# Patient Record
Sex: Female | Born: 1950
Health system: Southern US, Community
[De-identification: ages and names within clinical notes are randomized; demographics above are authoritative.]

## PROBLEM LIST (undated history)

## (undated) ENCOUNTER — Emergency Department (HOSPITAL_BASED_OUTPATIENT_CLINIC_OR_DEPARTMENT_OTHER): Admission: EM | Source: Home / Self Care

## (undated) DIAGNOSIS — G8929 Other chronic pain: Secondary | ICD-10-CM

## (undated) DIAGNOSIS — F32A Depression, unspecified: Secondary | ICD-10-CM

## (undated) DIAGNOSIS — Z8669 Personal history of other diseases of the nervous system and sense organs: Secondary | ICD-10-CM

## (undated) DIAGNOSIS — K08109 Complete loss of teeth, unspecified cause, unspecified class: Secondary | ICD-10-CM

## (undated) DIAGNOSIS — M549 Dorsalgia, unspecified: Secondary | ICD-10-CM

## (undated) DIAGNOSIS — Z87440 Personal history of urinary (tract) infections: Secondary | ICD-10-CM

## (undated) DIAGNOSIS — N1330 Unspecified hydronephrosis: Secondary | ICD-10-CM

## (undated) DIAGNOSIS — Z860100 Personal history of colon polyps, unspecified: Secondary | ICD-10-CM

## (undated) DIAGNOSIS — M51369 Other intervertebral disc degeneration, lumbar region without mention of lumbar back pain or lower extremity pain: Secondary | ICD-10-CM

## (undated) DIAGNOSIS — G2581 Restless legs syndrome: Secondary | ICD-10-CM

## (undated) DIAGNOSIS — H269 Unspecified cataract: Secondary | ICD-10-CM

## (undated) DIAGNOSIS — N189 Chronic kidney disease, unspecified: Secondary | ICD-10-CM

## (undated) DIAGNOSIS — N210 Calculus in bladder: Secondary | ICD-10-CM

## (undated) DIAGNOSIS — E039 Hypothyroidism, unspecified: Secondary | ICD-10-CM

## (undated) DIAGNOSIS — Z8619 Personal history of other infectious and parasitic diseases: Secondary | ICD-10-CM

## (undated) DIAGNOSIS — K589 Irritable bowel syndrome without diarrhea: Secondary | ICD-10-CM

## (undated) DIAGNOSIS — K2270 Barrett's esophagus without dysplasia: Secondary | ICD-10-CM

## (undated) DIAGNOSIS — J309 Allergic rhinitis, unspecified: Secondary | ICD-10-CM

## (undated) DIAGNOSIS — Z87448 Personal history of other diseases of urinary system: Secondary | ICD-10-CM

## (undated) DIAGNOSIS — Z973 Presence of spectacles and contact lenses: Secondary | ICD-10-CM

## (undated) DIAGNOSIS — M4150 Other secondary scoliosis, site unspecified: Secondary | ICD-10-CM

## (undated) DIAGNOSIS — Z87442 Personal history of urinary calculi: Secondary | ICD-10-CM

## (undated) DIAGNOSIS — M4306 Spondylolysis, lumbar region: Secondary | ICD-10-CM

## (undated) DIAGNOSIS — B019 Varicella without complication: Secondary | ICD-10-CM

## (undated) DIAGNOSIS — M5136 Other intervertebral disc degeneration, lumbar region: Secondary | ICD-10-CM

## (undated) DIAGNOSIS — L409 Psoriasis, unspecified: Secondary | ICD-10-CM

## (undated) DIAGNOSIS — M8580 Other specified disorders of bone density and structure, unspecified site: Secondary | ICD-10-CM

## (undated) DIAGNOSIS — N135 Crossing vessel and stricture of ureter without hydronephrosis: Secondary | ICD-10-CM

## (undated) DIAGNOSIS — N3289 Other specified disorders of bladder: Secondary | ICD-10-CM

## (undated) DIAGNOSIS — I10 Essential (primary) hypertension: Secondary | ICD-10-CM

## (undated) DIAGNOSIS — R51 Headache: Secondary | ICD-10-CM

## (undated) DIAGNOSIS — T7840XA Allergy, unspecified, initial encounter: Secondary | ICD-10-CM

## (undated) DIAGNOSIS — K219 Gastro-esophageal reflux disease without esophagitis: Secondary | ICD-10-CM

## (undated) DIAGNOSIS — E1142 Type 2 diabetes mellitus with diabetic polyneuropathy: Secondary | ICD-10-CM

## (undated) DIAGNOSIS — M4180 Other forms of scoliosis, site unspecified: Secondary | ICD-10-CM

## (undated) DIAGNOSIS — L219 Seborrheic dermatitis, unspecified: Secondary | ICD-10-CM

## (undated) DIAGNOSIS — G4700 Insomnia, unspecified: Secondary | ICD-10-CM

## (undated) DIAGNOSIS — M797 Fibromyalgia: Secondary | ICD-10-CM

## (undated) DIAGNOSIS — F411 Generalized anxiety disorder: Secondary | ICD-10-CM

## (undated) DIAGNOSIS — K635 Polyp of colon: Secondary | ICD-10-CM

## (undated) DIAGNOSIS — F329 Major depressive disorder, single episode, unspecified: Secondary | ICD-10-CM

## (undated) DIAGNOSIS — E119 Type 2 diabetes mellitus without complications: Secondary | ICD-10-CM

## (undated) DIAGNOSIS — E785 Hyperlipidemia, unspecified: Secondary | ICD-10-CM

## (undated) DIAGNOSIS — G629 Polyneuropathy, unspecified: Secondary | ICD-10-CM

## (undated) HISTORY — DX: Hyperlipidemia, unspecified: E78.5

## (undated) HISTORY — DX: Depression, unspecified: F32.A

## (undated) HISTORY — DX: Headache: R51

## (undated) HISTORY — DX: Type 2 diabetes mellitus with diabetic polyneuropathy: E11.42

## (undated) HISTORY — PX: UPPER GI ENDOSCOPY: SHX6162

## (undated) HISTORY — DX: Polyp of colon: K63.5

## (undated) HISTORY — DX: Varicella without complication: B01.9

## (undated) HISTORY — DX: Unspecified cataract: H26.9

## (undated) HISTORY — DX: Allergy, unspecified, initial encounter: T78.40XA

## (undated) HISTORY — DX: Allergic rhinitis, unspecified: J30.9

## (undated) HISTORY — DX: Major depressive disorder, single episode, unspecified: F32.9

## (undated) HISTORY — DX: Hypothyroidism, unspecified: E03.9

## (undated) HISTORY — DX: Chronic kidney disease, unspecified: N18.9

## (undated) HISTORY — PX: LUMBAR MICRODISCECTOMY: SHX99

## (undated) HISTORY — DX: Personal history of urinary (tract) infections: Z87.440

## (undated) HISTORY — PX: GALLBLADDER SURGERY: SHX652

## (undated) HISTORY — PX: CHOLECYSTECTOMY: SHX55

## (undated) HISTORY — DX: Unspecified hydronephrosis: N13.30

---

## 1985-06-22 HISTORY — PX: ABDOMINAL HYSTERECTOMY: SHX81

## 1998-01-29 ENCOUNTER — Ambulatory Visit (HOSPITAL_COMMUNITY): Admission: RE | Admit: 1998-01-29 | Discharge: 1998-01-29 | Payer: Self-pay | Admitting: Neurosurgery

## 2000-06-22 LAB — HM COLONOSCOPY

## 2000-10-30 ENCOUNTER — Ambulatory Visit (HOSPITAL_COMMUNITY): Admission: RE | Admit: 2000-10-30 | Discharge: 2000-10-30 | Payer: Self-pay | Admitting: Internal Medicine

## 2000-10-30 ENCOUNTER — Encounter: Payer: Self-pay | Admitting: Internal Medicine

## 2000-12-17 ENCOUNTER — Encounter: Payer: Self-pay | Admitting: Neurosurgery

## 2000-12-17 ENCOUNTER — Encounter: Admission: RE | Admit: 2000-12-17 | Discharge: 2000-12-17 | Payer: Self-pay | Admitting: Neurosurgery

## 2001-01-07 ENCOUNTER — Encounter: Payer: Self-pay | Admitting: Neurosurgery

## 2001-01-07 ENCOUNTER — Encounter: Admission: RE | Admit: 2001-01-07 | Discharge: 2001-01-07 | Payer: Self-pay | Admitting: Neurosurgery

## 2001-06-22 HISTORY — PX: OTHER SURGICAL HISTORY: SHX169

## 2001-06-24 ENCOUNTER — Encounter: Payer: Self-pay | Admitting: Neurosurgery

## 2001-06-24 ENCOUNTER — Ambulatory Visit (HOSPITAL_COMMUNITY): Admission: RE | Admit: 2001-06-24 | Discharge: 2001-06-24 | Payer: Self-pay | Admitting: Neurosurgery

## 2002-04-06 ENCOUNTER — Encounter: Admission: RE | Admit: 2002-04-06 | Discharge: 2002-07-05 | Payer: Self-pay | Admitting: Orthopedic Surgery

## 2002-05-14 ENCOUNTER — Ambulatory Visit (HOSPITAL_COMMUNITY): Admission: RE | Admit: 2002-05-14 | Discharge: 2002-05-14 | Payer: Self-pay | Admitting: Orthopedic Surgery

## 2002-05-14 ENCOUNTER — Encounter: Payer: Self-pay | Admitting: Orthopedic Surgery

## 2002-06-13 ENCOUNTER — Ambulatory Visit (HOSPITAL_BASED_OUTPATIENT_CLINIC_OR_DEPARTMENT_OTHER): Admission: RE | Admit: 2002-06-13 | Discharge: 2002-06-13 | Payer: Self-pay | Admitting: Orthopedic Surgery

## 2004-07-17 ENCOUNTER — Ambulatory Visit: Payer: Self-pay | Admitting: Internal Medicine

## 2004-07-21 ENCOUNTER — Ambulatory Visit (HOSPITAL_COMMUNITY): Admission: RE | Admit: 2004-07-21 | Discharge: 2004-07-21 | Payer: Self-pay | Admitting: Internal Medicine

## 2004-10-16 ENCOUNTER — Ambulatory Visit: Payer: Self-pay | Admitting: Internal Medicine

## 2004-12-04 ENCOUNTER — Ambulatory Visit: Payer: Self-pay | Admitting: Internal Medicine

## 2005-02-07 ENCOUNTER — Emergency Department (HOSPITAL_COMMUNITY): Admission: EM | Admit: 2005-02-07 | Discharge: 2005-02-07 | Payer: Self-pay | Admitting: Family Medicine

## 2006-02-25 ENCOUNTER — Ambulatory Visit: Payer: Self-pay | Admitting: Internal Medicine

## 2007-10-11 DIAGNOSIS — G8929 Other chronic pain: Secondary | ICD-10-CM | POA: Insufficient documentation

## 2007-10-11 DIAGNOSIS — I10 Essential (primary) hypertension: Secondary | ICD-10-CM | POA: Insufficient documentation

## 2007-10-11 DIAGNOSIS — M797 Fibromyalgia: Secondary | ICD-10-CM | POA: Insufficient documentation

## 2007-10-11 DIAGNOSIS — J309 Allergic rhinitis, unspecified: Secondary | ICD-10-CM | POA: Insufficient documentation

## 2008-03-17 ENCOUNTER — Emergency Department (HOSPITAL_COMMUNITY): Admission: EM | Admit: 2008-03-17 | Discharge: 2008-03-17 | Payer: Self-pay | Admitting: Emergency Medicine

## 2008-08-30 ENCOUNTER — Ambulatory Visit: Payer: Self-pay | Admitting: Internal Medicine

## 2008-08-30 LAB — CONVERTED CEMR LAB
ALT: 54 units/L — ABNORMAL HIGH (ref 0–35)
AST: 37 units/L (ref 0–37)
Albumin: 3.8 g/dL (ref 3.5–5.2)
Alkaline Phosphatase: 79 units/L (ref 39–117)
BUN: 17 mg/dL (ref 6–23)
Basophils Absolute: 0 10*3/uL (ref 0.0–0.1)
Basophils Relative: 0.1 % (ref 0.0–3.0)
Bilirubin, Direct: 0.1 mg/dL (ref 0.0–0.3)
CO2: 26 meq/L (ref 19–32)
Calcium: 8.8 mg/dL (ref 8.4–10.5)
Chloride: 110 meq/L (ref 96–112)
Cholesterol: 201 mg/dL (ref 0–200)
Creatinine, Ser: 0.8 mg/dL (ref 0.4–1.2)
Direct LDL: 143.5 mg/dL
Eosinophils Absolute: 0.1 10*3/uL (ref 0.0–0.7)
Eosinophils Relative: 1.5 % (ref 0.0–5.0)
GFR calc Af Amer: 95 mL/min
GFR calc non Af Amer: 78 mL/min
Glucose, Bld: 118 mg/dL — ABNORMAL HIGH (ref 70–99)
HCT: 38.9 % (ref 36.0–46.0)
HDL: 36.8 mg/dL — ABNORMAL LOW (ref >39.0)
Hemoglobin: 13.4 g/dL (ref 12.0–15.0)
Lymphocytes Relative: 29.5 % (ref 12.0–46.0)
MCHC: 34.3 g/dL (ref 30.0–36.0)
MCV: 87.8 fL (ref 78.0–100.0)
Monocytes Absolute: 0.3 10*3/uL (ref 0.1–1.0)
Monocytes Relative: 5.2 % (ref 3.0–12.0)
Neutro Abs: 4 10*3/uL (ref 1.4–7.7)
Neutrophils Relative %: 63.7 % (ref 43.0–77.0)
Platelets: 175 10*3/uL (ref 150–400)
Potassium: 3.5 meq/L (ref 3.5–5.1)
RBC: 4.44 M/uL (ref 3.87–5.11)
RDW: 13 % (ref 11.5–14.6)
Sodium: 146 meq/L — ABNORMAL HIGH (ref 135–145)
Total Bilirubin: 0.7 mg/dL (ref 0.3–1.2)
Total CHOL/HDL Ratio: 5.5
Total Protein: 6.8 g/dL (ref 6.0–8.3)
Triglycerides: 119 mg/dL (ref 0–149)
VLDL: 24 mg/dL (ref 0–40)
WBC: 6.2 10*3/uL (ref 4.5–10.5)

## 2009-04-10 ENCOUNTER — Telehealth: Payer: Self-pay | Admitting: Internal Medicine

## 2009-08-23 ENCOUNTER — Telehealth: Payer: Self-pay | Admitting: Internal Medicine

## 2009-08-23 ENCOUNTER — Ambulatory Visit: Payer: Self-pay | Admitting: Internal Medicine

## 2009-11-21 ENCOUNTER — Telehealth: Payer: Self-pay | Admitting: Internal Medicine

## 2009-11-22 ENCOUNTER — Encounter: Payer: Self-pay | Admitting: Internal Medicine

## 2009-11-25 ENCOUNTER — Encounter: Payer: Self-pay | Admitting: Internal Medicine

## 2009-11-25 ENCOUNTER — Telehealth: Payer: Self-pay | Admitting: Internal Medicine

## 2009-12-02 ENCOUNTER — Encounter: Payer: Self-pay | Admitting: Internal Medicine

## 2009-12-24 ENCOUNTER — Encounter: Payer: Self-pay | Admitting: Internal Medicine

## 2010-02-27 ENCOUNTER — Telehealth: Payer: Self-pay | Admitting: Internal Medicine

## 2010-03-21 ENCOUNTER — Ambulatory Visit: Payer: Self-pay | Admitting: Internal Medicine

## 2010-05-07 ENCOUNTER — Telehealth: Payer: Self-pay | Admitting: Internal Medicine

## 2010-05-14 ENCOUNTER — Encounter: Payer: Self-pay | Admitting: Internal Medicine

## 2010-06-07 ENCOUNTER — Ambulatory Visit: Payer: Self-pay | Admitting: Family Medicine

## 2010-06-07 LAB — CONVERTED CEMR LAB
Bilirubin Urine: NEGATIVE
Glucose, Urine, Semiquant: NEGATIVE
Ketones, urine, test strip: NEGATIVE
Nitrite: NEGATIVE
Protein, U semiquant: NEGATIVE
Specific Gravity, Urine: 1.015
Urobilinogen, UA: 0.2
WBC Urine, dipstick: NEGATIVE
pH: 6

## 2010-06-20 ENCOUNTER — Telehealth: Payer: Self-pay | Admitting: Internal Medicine

## 2010-06-30 ENCOUNTER — Emergency Department (HOSPITAL_COMMUNITY)
Admission: EM | Admit: 2010-06-30 | Discharge: 2010-07-01 | Payer: Self-pay | Source: Home / Self Care | Admitting: Emergency Medicine

## 2010-07-07 ENCOUNTER — Ambulatory Visit
Admission: RE | Admit: 2010-07-07 | Discharge: 2010-07-07 | Payer: Self-pay | Source: Home / Self Care | Attending: Internal Medicine | Admitting: Internal Medicine

## 2010-07-07 ENCOUNTER — Other Ambulatory Visit: Payer: Self-pay | Admitting: Internal Medicine

## 2010-07-07 DIAGNOSIS — N390 Urinary tract infection, site not specified: Secondary | ICD-10-CM | POA: Insufficient documentation

## 2010-07-07 DIAGNOSIS — N760 Acute vaginitis: Secondary | ICD-10-CM | POA: Insufficient documentation

## 2010-07-07 LAB — CBC
HCT: 47.9 % — ABNORMAL HIGH (ref 36.0–46.0)
Hemoglobin: 16.2 g/dL — ABNORMAL HIGH (ref 12.0–15.0)
MCH: 30.7 pg (ref 26.0–34.0)
MCHC: 33.8 g/dL (ref 30.0–36.0)
MCV: 90.7 fL (ref 78.0–100.0)
Platelets: 142 10*3/uL — ABNORMAL LOW (ref 150–400)
RBC: 5.28 MIL/uL — ABNORMAL HIGH (ref 3.87–5.11)
RDW: 13.5 % (ref 11.5–15.5)
WBC: 2.8 10*3/uL — ABNORMAL LOW (ref 4.0–10.5)

## 2010-07-07 LAB — DIFFERENTIAL
Basophils Absolute: 0 10*3/uL (ref 0.0–0.1)
Basophils Relative: 0 % (ref 0–1)
Eosinophils Absolute: 0 10*3/uL (ref 0.0–0.7)
Eosinophils Relative: 0 % (ref 0–5)
Lymphocytes Relative: 11 % — ABNORMAL LOW (ref 12–46)
Lymphs Abs: 0.3 10*3/uL — ABNORMAL LOW (ref 0.7–4.0)
Monocytes Absolute: 0 10*3/uL — ABNORMAL LOW (ref 0.1–1.0)
Monocytes Relative: 1 % — ABNORMAL LOW (ref 3–12)
Neutro Abs: 2.4 10*3/uL (ref 1.7–7.7)
Neutrophils Relative %: 87 % — ABNORMAL HIGH (ref 43–77)

## 2010-07-07 LAB — CK TOTAL AND CKMB (NOT AT ARMC)
CK, MB: 0.8 ng/mL (ref 0.3–4.0)
Relative Index: INVALID (ref 0.0–2.5)
Total CK: 41 U/L (ref 7–177)

## 2010-07-07 LAB — TROPONIN I: Troponin I: 0.01 ng/mL (ref 0.00–0.06)

## 2010-07-07 LAB — URINALYSIS, ROUTINE W REFLEX MICROSCOPIC
Hgb urine dipstick: NEGATIVE
Ketones, ur: 15 mg/dL — AB
Nitrite: POSITIVE — AB
Protein, ur: 30 mg/dL — AB
Specific Gravity, Urine: 1.022 (ref 1.005–1.030)
Urine Glucose, Fasting: NEGATIVE mg/dL
Urobilinogen, UA: 2 mg/dL — ABNORMAL HIGH (ref 0.0–1.0)
pH: 5 (ref 5.0–8.0)

## 2010-07-07 LAB — BASIC METABOLIC PANEL
BUN: 18 mg/dL (ref 6–23)
CO2: 19 mEq/L (ref 19–32)
Calcium: 9 mg/dL (ref 8.4–10.5)
Chloride: 108 mEq/L (ref 96–112)
Creatinine, Ser: 0.96 mg/dL (ref 0.4–1.2)
GFR calc Af Amer: >60 mL/min (ref >60)
GFR calc non Af Amer: 59 mL/min — ABNORMAL LOW (ref >60)
Glucose, Bld: 110 mg/dL — ABNORMAL HIGH (ref 70–99)
Potassium: 4.4 mEq/L (ref 3.5–5.1)
Sodium: 141 mEq/L (ref 135–145)

## 2010-07-07 LAB — URINALYSIS
Bilirubin Urine: NEGATIVE
Hemoglobin, Urine: NEGATIVE
Ketones, ur: NEGATIVE
Leukocytes, UA: NEGATIVE
Nitrite: NEGATIVE
Specific Gravity, Urine: >=1.03 (ref 1.000–1.030)
Total Protein, Urine: NEGATIVE
Urine Glucose: NEGATIVE
Urobilinogen, UA: 0.2 (ref 0.0–1.0)
pH: 5 (ref 5.0–8.0)

## 2010-07-07 LAB — URINE MICROSCOPIC-ADD ON

## 2010-07-22 NOTE — Progress Notes (Signed)
Summary: Nexium PA  Phone Note From Pharmacy   Details of Request: Miranda Mathis [phone] Summary of Call: PA request--Nexium. Has patient tried and failed Omeprazole and is it ok for prior authorization? Initial call taken by: Lucious Groves,  November 21, 2009 3:18 PM  Follow-up for Phone Call        OK for PA - she has failed omeprazole Follow-up by: Jacques Navy MD,  November 21, 2009 4:50 PM  Additional Follow-up for Phone Call Additional follow up Details #1::        form completed and faxed, will await insurance company reply. Additional Follow-up by: Lucious Groves,  November 22, 2009 9:02 AM

## 2010-07-22 NOTE — Progress Notes (Signed)
Summary: Nexium pa  Phone Note Other Incoming   Summary of Call: Nexium has been denied, will review paperwork once scanned. Initial call taken by: Lucious Groves,  November 25, 2009 3:19 PM  Follow-up for Phone Call        Form appears sufficient, would you like to appeal? Follow-up by: Lucious Groves,  November 27, 2009 10:14 AM  Additional Follow-up for Phone Call Additional follow up Details #1::        Will appeal: patient has failed maximum dose of H2 blocker - ranitidine 300mg  two times a day and has failed standard dose omeprazole. If appeal is denied patient will need to se GI to obtain endorsement of need for high dose PPI therapy (they may try her on dexilant - even more costly to insurance) Additional Follow-up by: Jacques Navy MD,  November 27, 2009 5:42 PM    Additional Follow-up for Phone Call Additional follow up Details #2::    Appeal letter faxed directly to appeal coordinator requesting review.Lucious Groves,  December 02, 2009 4:53 PM   Appended Document: Nexium pa Re-sent the appeal due to agent with insurance company telling me it went to the wrong dept. I did not receive an answer to how it could be the wrong dept when I sent it to what is listed on the denial from their company. Will check back on appeal later.  Appended Document: Nexium pa Approved x3 months

## 2010-07-22 NOTE — Medication Information (Signed)
Summary: Prior [place] for Nexiim/[facility]  Prior [employer]ho for Nexiim/[facility]   Imported By: Sherian Rein 05/21/2010 09:12:12  _____________________________________________________________________  External Attachment:    Type:   Image     Comment:   External Document

## 2010-07-22 NOTE — Medication Information (Signed)
Summary: Nexium DENIED/[facility]  Nexium DENIED/[facility]   Imported By: Sherian Rein 12/04/2009 11:00:46  _____________________________________________________________________  External Attachment:    Type:   Image     Comment:   External Document

## 2010-07-22 NOTE — Progress Notes (Signed)
Summary: refill--zolpidem  Phone Note Refill Request Message from:  [facility] [name] [address] on February 27, 2010 10:56 AM  Refills Requested: Medication #1:  AMBIEN 10 MG  TABS Take 1 tab by mouth at bedtime   Dosage confirmed as above?Dosage Confirmed   Supply Requested: 90   Last Refilled: 05/03/2009 Next Appointment Scheduled: none Initial call taken by: Mervin Kung CMA Duncan Dull),  February 27, 2010 10:56 AM  Follow-up for Phone Call        ok for refill x 5 Follow-up by: Jacques Navy MD,  February 27, 2010 1:03 PM    Prescriptions: AMBIEN 10 MG  TABS (ZOLPIDEM TARTRATE) Take 1 tab by mouth at bedtime  #30 x 5   Entered by:   Ami Bullins CMA   Authorized by:   Jacques Navy MD   Signed by:   Bill Salinas CMA on 02/27/2010   Method used:   Telephoned to ...       [facility]  [name] [address] (retail)       [name]       [place], [place]  [address]       Ph: [phone] or [phone]       Fax: [phone]   RxID:   [id number]

## 2010-07-22 NOTE — Letter (Signed)
Summary: Generic Letter  [facility]  [address] [place], [place] [address]   Phone: [phone]  Fax: [phone]    12/02/2009  Attn: Appeals Coordinator Fax: [phone] or [phone]   Patient: Miranda Mathis ([birth year]) [address] [place], [place]  [address] ID# [id number]  To Whom It May Concern,   The above named patient has chronic/severe and frequent symptoms of GERD. The patient has tried and failed maximum dose of H2 blocker - ranitidine 300mg  two times a day, standard dose omeprazole, and has also tried and failed standard does of Nexium. Please approve twice daily dosing of Nexium for patient as once daily dosing has not controlled patient symptoms. Your prompt attention to this matter is greatly appreciated.    Sincerely,  Rosalyn Gess. Aveion Nguyen, MD

## 2010-07-22 NOTE — Medication Information (Signed)
Summary: Nexium Approved/[facility]  Nexium Approved/[facility]   Imported By: Sherian Rein 12/31/2009 08:30:51  _____________________________________________________________________  External Attachment:    Type:   Image     Comment:   External Document

## 2010-07-22 NOTE — Progress Notes (Signed)
Summary: PA NEXIUM   Phone Note Call from Patient   Summary of Call: Pt needs PA on Nexium. Pt was approved only for temp per scanned document. She has tried tagament, zantac, prilosec, protonix & prevacid in the past. Dr Debby Bud, please look at form from insurance regarding Nexium coverage.  Initial call taken by: Lamar Sprinkles, CMA,  May 07, 2010 5:53 PM

## 2010-07-22 NOTE — Medication Information (Signed)
Summary: Prior Auth/[facility]  Prior Auth/[facility]   Imported By: Lester [place] 11/26/2009 10:20:00  _____________________________________________________________________  External Attachment:    Type:   Image     Comment:   External Document

## 2010-07-22 NOTE — Assessment & Plan Note (Signed)
Summary: pt c/o sore throat and hoarsness x 1 week with symptoms no be...   Vital Signs:  Patient profile:   60 year old female Height:      63 inches Weight:      208 pounds BMI:     36.98 O2 Sat:      98 % on Room air Temp:     98.6 degrees F oral Pulse rate:   97 / minute Pulse rhythm:   regular Resp:     16 per minute BP sitting:   138 / 80  (left arm) Cuff size:   large  Vitals Entered By: Bill Salinas CMA (August 23, 2009 8:06 AM)  Nutrition Counseling: Patient's BMI is greater than 25 and therefore counseled on weight management options.  O2 Flow:  Room air CC: pt c/o hoarsness with cough and sore throat, nasal and chest congestion x 1 week/ ab, URI symptoms   Primary Care Provider:  Jacques Navy MD  CC:  pt c/o hoarsness with cough and sore throat, nasal and chest congestion x 1 week/ ab, and URI symptoms.  History of Present Illness:  URI Symptoms      This is a 60 year old woman who presents with URI symptoms.  The symptoms began 1 week ago.  The severity is described as moderate.  The patient reports nasal congestion, purulent nasal discharge, sore throat, and productive cough, but denies earache and sick contacts.  Associated symptoms include low-grade fever (<100.5 degrees).  The patient denies stiff neck, dyspnea, wheezing, rash, vomiting, diarrhea, use of an antipyretic, and response to antipyretic.  The patient denies headache, muscle aches, and severe fatigue.  The patient denies the following risk factors for Strep sinusitis: unilateral facial pain, unilateral nasal discharge, tooth pain, Strep exposure, tender adenopathy, and absence of cough.    Preventive Screening-Counseling & Management  Alcohol-Tobacco     Alcohol drinks/day: 0     Smoking Status: never  Caffeine-Diet-Exercise     Does Patient Exercise: no  Hep-HIV-STD-Contraception     Hepatitis Risk: no risk noted     HIV Risk: no risk noted     STD Risk: no risk noted      Sexual History:   currently monogamous.        Drug Use:  never and no.        Blood Transfusions:  no.    Medications Prior to Update: 1)  Celebrex 200 Mg  Caps (Celecoxib) .... Take 1 Tablet By Mouth Once A Day and One As Needed 2)  Ambien 10 Mg  Tabs (Zolpidem Tartrate) .... Take 1 Tab By Mouth At Bedtime 3)  Nexium 40 Mg  Cpdr (Esomeprazole Magnesium) .... Take 1 Tablet By Mouth Two Times A Day 4)  Darvocet-N 100 100-650 Mg  Tabs (Propoxyphene N-Apap) .[name].. 1 Tablet Every 6 Hours Prn 5)  Imitrex 5 Mg/act  Soln (Sumatriptan) .... As Needed 6)  Miralax  Powd (Polyethylene Glycol 3350) .[name].. 1 Dose Daily 7)  Dicyclomine Hcl 10 Mg Caps (Dicyclomine Hcl) .... Take Three Times A Day Titrated To Symptoms  Current Medications (verified): 1)  Celebrex 200 Mg  Caps (Celecoxib) .... Take 1 Tablet By Mouth Once A Day and One As Needed 2)  Ambien 10 Mg  Tabs (Zolpidem Tartrate) .... Take 1 Tab By Mouth At Bedtime 3)  Nexium 40 Mg  Cpdr (Esomeprazole Magnesium) .... Take 1 Tablet By Mouth Two Times A Day 4)  Darvocet-N 100 100-650 Mg  Tabs (Propoxyphene N-Apap) .[name].. 1 Tablet Every 6 Hours Prn 5)  Imitrex 5 Mg/act  Soln (Sumatriptan) .... As Needed 6)  Miralax  Powd (Polyethylene Glycol 3350) .[name].. 1 Dose Daily 7)  Dicyclomine Hcl 10 Mg Caps (Dicyclomine Hcl) .... Take Three Times A Day Titrated To Symptoms 8)  Avelox 400 Mg Tabs (Moxifloxacin Hcl) .... One By Mouth Once Daily For 5 Days 9)  Tussionex Pennkinetic Er 8-10 Mg/80ml Lqcr (Chlorpheniramine-Hydrocodone) .... % Ml By Mouth Two Times A Day As Needed For Cough  Allergies (verified): 1)  ! Ms Contin (Morphine Sulfate) 2)  ! * Tetanus 3)  ! * Flu Vaccination  Past History:  Past Medical History: Reviewed history from 08/30/2008 and no changes required. HEADACHE, CHRONIC (ICD-784.0) ALLERGIC RHINITIS (ICD-477.9) HYPERTENSION (ICD-401.9) FIBROMYALGIA (ICD-729.1) BACK PAIN, CHRONIC (ICD-724.5)  Past Surgical History: Reviewed history from 10/11/2007 and  no changes required. Caesarean section Hysterectomy Lumbar diskectomy R Heel surgery  Family History: Reviewed history from 08/30/2008 and no changes required. father - deceased 26: Prostate cancer mother- deceased @78 : COPD, emphysema, OA Neg- breast or colon cancer, CAD 2nd degree kin with DM  Social History: Reviewed history from 08/30/2008 and no changes required. 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 Never Smoked Alcohol use-no Drug use-no Regular exercise-no Smoking Status:  never Drug Use:  never, no Does Patient Exercise:  no Hepatitis Risk:  no risk noted HIV Risk:  no risk noted STD Risk:  no risk noted Sexual History:  currently monogamous Blood Transfusions:  no  Review of Systems       The patient complains of fever, hoarseness, and prolonged cough.  The patient denies anorexia, weight loss, weight gain, chest pain, syncope, dyspnea on exertion, peripheral edema, headaches, hemoptysis, abdominal pain, suspicious skin lesions, enlarged lymph nodes, and angioedema.   ENT:  Complains of hoarseness, nasal congestion, and sore throat; denies decreased hearing, difficulty swallowing, ear discharge, earache, ringing in ears, and sinus pressure.  Physical Exam  General:  alert, well-developed, well-nourished, well-hydrated, appropriate dress, normal appearance, healthy-appearing, and cooperative to examination.  She has a raspy voice but there is no resp. distress, trismus, or stridor. Head:  normocephalic, atraumatic, no abnormalities observed, and no abnormalities palpated.   Eyes:  vision grossly intact, pupils equal, pupils round, and pupils reactive to light.   Ears:  R ear normal and L ear normal.   Nose:  no mucosal edema, no airflow obstruction, no intranasal foreign body, no nasal polyps, no nasal mucosal lesions, no mucosal friability, no active bleeding or  clots, no sinus percussion tenderness, no septum abnormalities, and nasal dischargemucosal pallor.   Mouth:  good dentition, no exudates, no posterior lymphoid hypertrophy, no postnasal drip, no pharyngeal crowing, no lesions, no aphthous ulcers, no erosions, no tongue abnormalities, no leukoplakia, no petechiae, and pharyngeal erythema.   Neck:  supple, full ROM, no masses, no thyromegaly, no JVD, no carotid bruits, and no cervical lymphadenopathy.   Lungs:  normal respiratory effort, no intercostal retractions, no accessory muscle use, normal breath sounds, no dullness, no fremitus, no crackles, and no wheezes.   Heart:  normal rate, regular rhythm, no murmur, no gallop, no rub, and no JVD.   Abdomen:  soft, non-tender, normal bowel sounds, no distention, no masses, no guarding, no rigidity, no hepatomegaly, and no splenomegaly.   Msk:  No deformity or scoliosis noted of thoracic or  lumbar spine.   Pulses:  R and L carotid,radial,femoral,dorsalis pedis and posterior tibial pulses are full and equal bilaterally Extremities:  No clubbing, cyanosis, edema, or deformity noted with normal full range of motion of all joints.   Neurologic:  No cranial nerve deficits noted. Station and gait are normal. Plantar reflexes are down-going bilaterally. DTRs are symmetrical throughout. Sensory, motor and coordinative functions appear intact. Skin:  Intact without suspicious lesions or rashes Cervical Nodes:  no anterior cervical adenopathy and no posterior cervical adenopathy.   Axillary Nodes:  no R axillary adenopathy and no L axillary adenopathy.   Inguinal Nodes:  no R inguinal adenopathy and no L inguinal adenopathy.   Psych:  Oriented X3, memory intact for recent and remote, normally interactive, good eye contact, and not anxious appearing.     Impression & Recommendations:  Problem # 1:  COUGH (ICD-786.2) Assessment New will look for pna, edema, mass, lesion Orders: T-2 View CXR (71020TC)  Problem  # 2:  BRONCHITIS-ACUTE (ICD-466.0) Assessment: New  Her updated medication list for this problem includes:    Avelox 400 Mg Tabs (Moxifloxacin hcl) ..... One by mouth once daily for 5 days    Tussionex Pennkinetic Er 8-10 Mg/63ml Lqcr (Chlorpheniramine-hydrocodone) .[name]... % ml by mouth two times a day as needed for cough  Take antibiotics and other medications as directed. Encouraged to push clear liquids, get enough rest, and take acetaminophen as needed. To be seen in 5-7 days if no improvement, sooner if worse.  Problem # 3:  LARYNGITIS-ACUTE (ICD-464.00) Assessment: New will try steroids to decrease laryngeal swelling Orders: Admin of Therapeutic Inj  intramuscular or subcutaneous (14782) Depo- Medrol 40mg  (J1030) Depo- Medrol 80mg  (J1040)  Complete Medication List: 1)  Celebrex 200 Mg Caps (Celecoxib) .... Take 1 tablet by mouth once a day and one as needed 2)  Ambien 10 Mg Tabs (Zolpidem tartrate) .... Take 1 tab by mouth at bedtime 3)  Nexium 40 Mg Cpdr (Esomeprazole magnesium) .... Take 1 tablet by mouth two times a day 4)  Darvocet-n 100 100-650 Mg Tabs (Propoxyphene n-apap) .[name].. 1 tablet every 6 hours prn 5)  Imitrex 5 Mg/act Soln (Sumatriptan) .... As needed 6)  Miralax Powd (Polyethylene glycol 3350) .[name].. 1 dose daily 7)  Dicyclomine Hcl 10 Mg Caps (Dicyclomine hcl) .... Take three times a day titrated to symptoms 8)  Avelox 400 Mg Tabs (Moxifloxacin hcl) .... One by mouth once daily for 5 days 9)  Tussionex Pennkinetic Er 8-10 Mg/69ml Lqcr (Chlorpheniramine-hydrocodone) .... % ml by mouth two times a day as needed for cough  Patient Instructions: 1)  Please schedule a follow-up appointment in 2 weeks. 2)  Take your antibiotic as prescribed until ALL of it is gone, but stop if you develop a rash or swelling and contact our office as soon as possible. 3)  Acute bronchitis symptoms for less than 10 days are not helped by antibiotics. take over the counter cough medications. call  if no improvment in  5-7 days, sooner if increasing cough, fever, or new symptoms( shortness of breath, chest pain). Prescriptions: TUSSIONEX PENNKINETIC ER 8-10 MG/5ML LQCR (CHLORPHENIRAMINE-HYDROCODONE) % ml by mouth two times a day as needed for cough  #4 ounces x 1   Entered and Authorized by:   Etta Grandchild MD   Signed by:   Etta Grandchild MD on 08/23/2009   Method used:   Print then Give to Patient   RxID:   [id number] AVELOX 400 MG TABS (  MOXIFLOXACIN HCL) One by mouth once daily for 5 days  #5 x 0   Entered and Authorized by:   Etta Grandchild MD   Signed by:   Etta Grandchild MD on 08/23/2009   Method used:   Samples Given   RxID:   [id number]    Not Administered:    Influenza Vaccine not given due to: declined    Medication Administration  Injection # 1:    Medication: Depo- Medrol 80mg     Diagnosis: LARYNGITIS-ACUTE (ICD-464.00)    Route: IM    Site: L deltoid    Exp Date: 02/2012    Lot #: Franchot Heidelberg    Mfr: Pharmacia    Patient tolerated injection without complications    Given by: Ami Bullins CMA (August 23, 2009 8:17 AM)  Injection # 2:    Medication: Depo- Medrol 40mg     Diagnosis: LARYNGITIS-ACUTE (ICD-464.00)    Route: IM    Site: L deltoid    Exp Date: 02/2012    Lot #: Franchot Heidelberg    Mfr: Pharmacia    Patient tolerated injection without complications    Given by: Ami Bullins CMA (August 23, 2009 8:18 AM)  Orders Added: 1)  T-2 View CXR [71020TC] 2)  Admin of Therapeutic Inj  intramuscular or subcutaneous [96372] 3)  Depo- Medrol 40mg  [J1030] 4)  Depo- Medrol 80mg  [J1040] 5)  Est. Patient Level IV [36644]

## 2010-07-22 NOTE — Assessment & Plan Note (Signed)
Summary: SINUS /NWS   Vital Signs:  Patient profile:   60 year old female Height:      63 inches Weight:      206 pounds O2 Sat:      98 % on Room air Temp:     97.6 degrees F oral Pulse rate:   86 / minute BP sitting:   170 / 100  (right arm)  Vitals Entered By: Bill Salinas CMA (March 21, 2010 3:52 PM)  O2 Flow:  Room air CC: ov for evaluation of sinus infection/ ab   Primary Care Aracelia Brinson:  Jacques Navy MD  CC:  ov for evaluation of sinus infection/ ab.  History of Present Illness: Originally scheduled appointment for headcold which has resolved.  Sheis noted tohave high BP today and it was high on repeat measurement by this examiner. She does admit that her BP when she checks it has been elevagted with systolics 140-150. She has been asymptomatic: no headache, nosebleeds, blurred vision, chest pain.  Current Medications (verified): 1)  Celebrex 200 Mg  Caps (Celecoxib) .... Take 1 Tablet By Mouth Once A Day and One As Needed 2)  Ambien 10 Mg  Tabs (Zolpidem Tartrate) .... Take 1 Tab By Mouth At Bedtime 3)  Nexium 40 Mg  Cpdr (Esomeprazole Magnesium) .... Take 1 Tablet By Mouth Two Times A Day 4)  Darvocet-N 100 100-650 Mg  Tabs (Propoxyphene N-Apap) .[name].. 1 Tablet Every 6 Hours Prn 5)  Imitrex 5 Mg/act  Soln (Sumatriptan) .... As Needed 6)  Miralax  Powd (Polyethylene Glycol 3350) .[name].. 1 Dose Daily 7)  Dicyclomine Hcl 10 Mg Caps (Dicyclomine Hcl) .... Take Three Times A Day Titrated To Symptoms  Allergies (verified): 1)  ! Ms Contin (Morphine Sulfate) 2)  ! * Tetanus 3)  ! 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 Head:  normocephalic and atraumatic.   Eyes:  pupils equal and pupils round.   Lungs:  normal respiratory effort and normal breath sounds.   Heart:  normal rate and regular rhythm.   Neurologic:  alert & oriented X3, cranial nerves II-XII intact, and gait normal.     Impression & Recommendations:  Problem # 1:  HYPERTENSION (ICD-401.9)  Her updated medication list for this problem includes:    Furosemide 20 Mg Tabs (Furosemide) .[name]... 1 by mouth once daily for htn  BP today: 170/100 Prior BP: 138/80 (08/23/2009)  Labs Reviewed: K+: 3.5 (09/01/2008) Creat: : 0.8 (2008/09/01)    Elevagted blood pressure at today's exam and at home.  Plan - start with diuretic, lasix 20mg  once daily            BP checks at home - if not controlled will adjust medications.           Increase dietary potassium.  Orders: No  Charge Patient Arrived (NCPA0) (NCPA0)  Complete Medication List: 1)  Celebrex 200 Mg Caps (Celecoxib) .... Take 1 tablet by mouth once a day and one as needed 2)  Ambien 10 Mg Tabs (Zolpidem tartrate) .... Take 1 tab by mouth at bedtime 3)  Nexium 40 Mg Cpdr (Esomeprazole magnesium) .... Take 1 tablet by mouth two times a day 4)  Darvocet-n 100 100-650 Mg Tabs (Propoxyphene n-apap) .[name].. 1 tablet every 6 hours prn 5)  Imitrex 5 Mg/act Soln (Sumatriptan) .... As needed 6)  Miralax Powd (Polyethylene glycol 3350) .[name].. 1 dose daily 7)  Dicyclomine Hcl 10 Mg Caps (Dicyclomine hcl) .... Take three times a day titrated to  symptoms 8)  Furosemide 20 Mg Tabs (Furosemide) .[name].. 1 by mouth once daily for htn Prescriptions: FUROSEMIDE 20 MG TABS (FUROSEMIDE) 1 by mouth once daily for HTN  #30 x 12   Entered and Authorized by:   Jacques Navy MD   Signed by:   Jacques Navy MD on 03/21/2010   Method used:   [facility]        [facility]  [name] [address] [facility]       [name]       [place], [place]  [address]       Ph: [phone] or [phone]       Fax: [phone]   RxID:   [id number]

## 2010-07-22 NOTE — Progress Notes (Signed)
Summary: RX ?  Phone Note From Pharmacy   Caller: [phone]  Summary of Call: Pharmacy called, please clarify direction of tussinex.  Initial call taken by: Lamar Sprinkles, CMA,  August 23, 2009 9:47 AM  Follow-up for Phone Call        5 ml by mouth two times a day prn Follow-up by: Etta Grandchild MD,  August 23, 2009 9:58 AM  Additional Follow-up for Phone Call Additional follow up Details #1::        Pharm informed Additional Follow-up by: Lamar Sprinkles, CMA,  August 23, 2009 10:35 AM

## 2010-07-23 ENCOUNTER — Telehealth (INDEPENDENT_AMBULATORY_CARE_PROVIDER_SITE_OTHER): Payer: Self-pay | Admitting: *Deleted

## 2010-07-24 NOTE — Progress Notes (Signed)
  Phone Note Other Incoming   Summary of Call: Pt was seen on 06/07/2010 by Dr Caryl Never. She was diagnosed with a UTI and given cipro. Pt states she is still having the same symptoms and has tried AZO with no relief. She wants to try septra. Please Advise Initial call taken by: Ami Bullins CMA,  June 20, 2010 8:52 AM  Follow-up for Phone Call        ok for septra DS (generic) 1 by mouth two times a day x 7 days.  Follow-up by: Jacques Navy MD,  June 20, 2010 9:09 AM    New/Updated Medications: SEPTRA DS 800-160 MG TABS (SULFAMETHOXAZOLE-TRIMETHOPRIM) 1 tablet by mouth two times a day x 7 days Prescriptions: SEPTRA DS 800-160 MG TABS (SULFAMETHOXAZOLE-TRIMETHOPRIM) 1 tablet by mouth two times a day x 7 days  #14 x 1   Entered by:   Ami Bullins CMA   Authorized by:   Jacques Navy MD   Signed by:   Bill Salinas CMA on 06/20/2010   Method used:   [facility]        [facility]  [name] [address]       [name]       [place], [place]  [address]       Ph: [phone] or [phone]       Fax: [phone]   RxID:   [id number]

## 2010-07-24 NOTE — Assessment & Plan Note (Signed)
Summary: BLADDER INFECTION/NWS   Vital Signs:  Patient profile:   60 year old female Menstrual status:  hysterectomy Height:      63 inches Weight:      201.13 pounds BMI:     35.76 O2 Sat:      97 % on Room air Temp:     97.9 degrees F oral Pulse rate:   86 / minute Pulse rhythm:   regular Resp:     16 per minute BP sitting:   128 / 88  (left arm) Cuff size:   large  Vitals Entered By: Rock Nephew CMA (July 07, 2010 3:52 PM)  Nutrition Counseling: Patient's BMI is greater than 25 and therefore counseled on weight management options.  O2 Flow:  Room air CC: patient c/o continued urinary pressure and pain Is Patient Diabetic? No Pain Assessment Patient in pain? no       Does patient need assistance? Functional Status Self care Ambulation Normal     Menstrual Status hysterectomy   Primary Care Provider:  Jacques Navy MD  CC:  patient c/o continued urinary pressure and pain.  History of Present Illness: new to me she comes in complaining of dysuria. she has been on 3 rounds of antibiotics and was getting better until today when the discomfort returned.  Preventive Screening-Counseling & Management  Alcohol-Tobacco     Alcohol drinks/day: 0     Alcohol Counseling: not indicated; patient does not drink     Smoking Status: never     Tobacco Counseling: not indicated; no tobacco use  Hep-HIV-STD-Contraception     Hepatitis Risk: no risk noted     HIV Risk: no risk noted     STD Risk: no risk noted      Sexual History:  currently monogamous.        Drug Use:  never and no.        Blood Transfusions:  no.    Clinical Review Panels:  Prevention   Last Colonoscopy:  done (06/22/2000)  Immunizations   Last Flu Vaccine:  Fluvax 3+ (03/22/2006)  Lipid Management   Cholesterol:  201 (08/30/2008)   LDL (bad choesterol):  DEL (08/30/2008)   HDL (good cholesterol):  36.8 (08/30/2008)  Diabetes Management   Creatinine:  0.8 (08/30/2008)   Last  Flu Vaccine:  Fluvax 3+ (03/22/2006)  CBC   WBC:  6.2 (08/30/2008)   RBC:  4.44 (08/30/2008)   Hgb:  13.4 (08/30/2008)   Hct:  38.9 (08/30/2008)   Platelets:  175 (08/30/2008)   MCV  87.8 (08/30/2008)   MCHC  34.3 (08/30/2008)   RDW  13.0 (08/30/2008)   PMN:  63.7 (08/30/2008)   Lymphs:  29.5 (08/30/2008)   Monos:  5.2 (08/30/2008)   Eosinophils:  1.5 (08/30/2008)   Basophil:  0.1 (08/30/2008)  Complete Metabolic Panel   Glucose:  118 (08/30/2008)   Sodium:  146 (08/30/2008)   Potassium:  3.5 (08/30/2008)   Chloride:  110 (08/30/2008)   CO2:  26 (08/30/2008)   BUN:  17 (08/30/2008)   Creatinine:  0.8 (08/30/2008)   Albumin:  3.8 (08/30/2008)   Total Protein:  6.8 (08/30/2008)   Calcium:  8.8 (08/30/2008)   Total Bili:  0.7 (08/30/2008)   Alk Phos:  79 (08/30/2008)   SGPT (ALT):  54 (08/30/2008)   SGOT (AST):  37 (08/30/2008)   Medications Prior to Update: 1)  Celebrex 200 Mg  Caps (Celecoxib) .... Take 1 Tablet By Mouth Once A Day and One  As Needed 2)  Ambien 10 Mg  Tabs (Zolpidem Tartrate) .... Take 1 Tab By Mouth At Bedtime 3)  Nexium 40 Mg  Cpdr (Esomeprazole Magnesium) .... Take 1 Tablet By Mouth Two Times A Day 4)  Darvocet-N 100 100-650 Mg  Tabs (Propoxyphene N-Apap) .[name].. 1 Tablet Every 6 Hours Prn 5)  Imitrex 5 Mg/act  Soln (Sumatriptan) .... As Needed 6)  Miralax  Powd (Polyethylene Glycol 3350) .[name].. 1 Dose Daily 7)  Dicyclomine Hcl 10 Mg Caps (Dicyclomine Hcl) .... Take Three Times A Day Titrated To Symptoms 8)  Furosemide 20 Mg Tabs (Furosemide) .[name].. 1 By Mouth Once Daily For Htn 9)  Septra Ds 800-160 Mg Tabs (Sulfamethoxazole-Trimethoprim) .[name].. 1 Tablet By Mouth Two Times A Day X 7 Days  Current Medications (verified): 1)  Celebrex 200 Mg  Caps (Celecoxib) .... Take 1 Tablet By Mouth Once A Day and One As Needed 2)  Ambien 10 Mg  Tabs (Zolpidem Tartrate) .... Take 1 Tab By Mouth At Bedtime 3)  Nexium 40 Mg  Cpdr (Esomeprazole Magnesium) .... Take 1 Tablet By  Mouth Two Times A Day 4)  Darvocet-N 100 100-650 Mg  Tabs (Propoxyphene N-Apap) .[name].. 1 Tablet Every 6 Hours Prn 5)  Imitrex 5 Mg/act  Soln (Sumatriptan) .... As Needed 6)  Miralax  Powd (Polyethylene Glycol 3350) .[name].. 1 Dose Daily 7)  Dicyclomine Hcl 10 Mg Caps (Dicyclomine Hcl) .... Take Three Times A Day Titrated To Symptoms 8)  Furosemide 20 Mg Tabs (Furosemide) .[name].. 1 By Mouth Once Daily For Htn 9)  Fluconazole 150 Mg Tabs (Fluconazole) .... One By Mouth As Directed 10)  Metronidazole 250 Mg Tabs (Metronidazole) .... One By Mouth Three Times A Day For 7 Days  Allergies (verified): 1)  ! Ms Contin (Morphine Sulfate) 2)  ! * Tetanus 3)  ! 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 Never Smoked Alcohol use-no Drug use-no Regular exercise-no  Risk Factors: Alcohol Use: 0 (07/07/2010) Exercise: no (08/23/2009)  Risk Factors: Smoking Status: never (07/07/2010)  Family History: Reviewed history from September 26, 2008 and no changes required. father - deceased 20: Prostate cancer mother- deceased @78 : COPD, emphysema, OA Neg- breast or colon cancer, CAD 2nd degree kin with DM  Social History: Reviewed history from 08/23/2009 and no changes required. 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   GU:  Complains of dysuria, urinary frequency, and urinary hesitancy; denies abnormal vaginal bleeding, discharge, incontinence, and nocturia.  Physical Exam  General:  alert, well-developed, well-nourished, well-hydrated, appropriate dress, normal appearance, healthy-appearing, and overweight-appearing.   Head:  normocephalic,  atraumatic, no abnormalities observed, and no abnormalities palpated.   Eyes:  vision grossly intact, pupils equal, and pupils round.   Ears:  R ear normal and L ear normal.   Nose:  External nasal examination shows no deformity or inflammation. Nasal mucosa are pink and moist without lesions or exudates. Mouth:  Oral mucosa and oropharynx without lesions or exudates.  Teeth in good repair. Neck:  No deformities, masses, or tenderness noted. Lungs:  Normal respiratory effort, chest expands symmetrically. Lungs are clear to auscultation, no crackles or wheezes. Heart:  normal rate and regular rhythm.   Abdomen:  soft, non-tender, no distention, and no masses.   Genitalia:  she is not willing to let me do a pelvic exam today to collect a specimen for wet mount Msk:  No deformity or scoliosis noted of thoracic or lumbar spine.   Extremities:  No clubbing, cyanosis, edema, or deformity noted with normal full range of motion of all joints.   Neurologic:  No cranial nerve deficits noted. Station and gait are normal. Plantar reflexes are down-going bilaterally. DTRs are symmetrical throughout. Sensory, motor and coordinative functions appear intact. Skin:  turgor normal, color normal,  no rashes, no suspicious lesions, no ecchymoses, no petechiae, no purpura, no ulcerations, and no edema.   Cervical Nodes:  no anterior cervical adenopathy and no posterior cervical adenopathy.   Psych:  Oriented X3, memory intact for recent and remote, normally interactive, good eye contact, and not anxious appearing.     Impression & Recommendations:  Problem # 1:  UTI (ICD-599.0) Assessment Deteriorated her UA is normal so I suspect her dysuria is vaginits The following medications were removed from the medication list:    Septra Ds 800-160 Mg Tabs (Sulfamethoxazole-trimethoprim) .[name]... 1 tablet by mouth two times a day x 7 days    Nitrofurantoin Macrocrystal 100 Mg Caps (Nitrofurantoin macrocrystal) ..... One by mouth two times a day for 5 days Her updated medication list for this problem includes:    Metronidazole 250 Mg Tabs (Metronidazole) ..... One by mouth three times a day for 7 days  Orders: T-Urine Culture (Spectrum Order) [id number])  Problem # 2:  VAGINITIS (ICD-616.10) Assessment: New will Rx with fluconazole and metronidazole The following medications were removed from the medication list:    Septra Ds 800-160 Mg Tabs (Sulfamethoxazole-trimethoprim) .[name]... 1 tablet by mouth two times a day x 7 days    Nitrofurantoin Macrocrystal 100 Mg Caps (Nitrofurantoin macrocrystal) ..... One by mouth two times a day for 5 days Her updated medication list for this problem includes:    Metronidazole 250 Mg Tabs (Metronidazole) ..... One by mouth three times a day for 7 days  Complete Medication List: 1)  Celebrex 200 Mg Caps (Celecoxib) .... Take 1 tablet by mouth once a day and one as needed 2)  Ambien 10 Mg Tabs (Zolpidem tartrate) .... Take 1 tab by mouth at bedtime 3)  Nexium 40 Mg Cpdr (Esomeprazole magnesium) .... Take 1 tablet by mouth two times a day 4)  Darvocet-n 100 100-650 Mg Tabs (Propoxyphene n-apap) .[name].. 1 tablet every 6 hours prn 5)  Imitrex 5 Mg/act Soln  (Sumatriptan) .... As needed 6)  Miralax Powd (Polyethylene glycol 3350) .[name].. 1 dose daily 7)  Dicyclomine Hcl 10 Mg Caps (Dicyclomine hcl) .... Take three times a day titrated to symptoms 8)  Furosemide 20 Mg Tabs (Furosemide) .[name].. 1 by mouth once daily for htn 9)  Fluconazole 150 Mg Tabs (Fluconazole) .... One by mouth as  directed 10)  Metronidazole 250 Mg Tabs (Metronidazole) .... One by mouth three times a day for 7 days  Patient Instructions: 1)  Please schedule a follow-up appointment in 2 weeks. 2)  Take your antibiotic as prescribed until ALL of it is gone, but stop if you develop a rash or swelling and contact our office as soon as possible. Prescriptions: METRONIDAZOLE 250 MG TABS (METRONIDAZOLE) One by mouth three times a day for 7 days  #21 x 0   Entered and Authorized by:   Etta Grandchild MD   Signed by:   Etta Grandchild MD on 07/07/2010   Method used:   [facility]        [facility]  [name] [address]       [name]       [place], [place]  [address]       Ph: [phone] or [phone]       Fax: [phone]   RxID:   [id number] FLUCONAZOLE 150 MG TABS (FLUCONAZOLE) one by mouth as directed  #1 x 3   Entered and Authorized by:   Etta Grandchild MD   Signed by:   Etta Grandchild MD on 07/07/2010   Method used:   [facility]        [facility]  [name] [address]       [name]       [place], [place]  [address]       Ph: [phone] or [phone]       Fax: [phone]   RxID:   [id number] NITROFURANTOIN MACROCRYSTAL 100 MG CAPS (NITROFURANTOIN MACROCRYSTAL) One by mouth two times a day for 5 days  #10 x 0   Entered and Authorized by:   Etta Grandchild MD   Signed by:   Etta Grandchild MD on 07/07/2010   Method used:   [facility]        [facility]  [name] [address] [facility]       [name]       [place], [place]  [address]       Ph: [phone] or [phone]       Fax: [phone]   RxID:   [id number]    Orders  Added: 1)  T-Urine Culture (Spectrum Order) [[id number]] 2)  Est. Patient Level IV [96789]

## 2010-07-24 NOTE — Assessment & Plan Note (Signed)
Summary: ?uti/men/cd   Vital Signs:  Patient profile:   60 year old female Weight:      206 pounds BMI:     36.62 Temp:     97.6 degrees F oral BP sitting:   124 / 78  (left arm)  Vitals Entered By: Doristine Devoid CMA (June 07, 2010 9:26 AM) CC: uti sx x1 day along w/ back pain    History of Present Illness: Patient seen with one-day history of suprapubic pressure and some frequency of urination. Minimal, if any, burning. Increased urgency. No stress incontinence. Some mild right low back pain. No history of kidney stones. Denies any fever, chills, nausea, or vomiting. No vaginal discharge.  Current Medications (verified): 1)  Celebrex 200 Mg  Caps (Celecoxib) .... Take 1 Tablet By Mouth Once A Day and One As Needed 2)  Ambien 10 Mg  Tabs (Zolpidem Tartrate) .... Take 1 Tab By Mouth At Bedtime 3)  Nexium 40 Mg  Cpdr (Esomeprazole Magnesium) .... Take 1 Tablet By Mouth Two Times A Day 4)  Darvocet-N 100 100-650 Mg  Tabs (Propoxyphene N-Apap) .[name].. 1 Tablet Every 6 Hours Prn 5)  Imitrex 5 Mg/act  Soln (Sumatriptan) .... As Needed 6)  Miralax  Powd (Polyethylene Glycol 3350) .[name].. 1 Dose Daily 7)  Dicyclomine Hcl 10 Mg Caps (Dicyclomine Hcl) .... Take Three Times A Day Titrated To Symptoms 8)  Furosemide 20 Mg Tabs (Furosemide) .[name].. 1 By Mouth Once Daily For Htn  Allergies (verified): 1)  ! Ms Contin (Morphine Sulfate) 2)  ! * Tetanus 3)  ! * Flu Vaccination  Past History:  Past Medical History: Last updated: 08/30/2008 HEADACHE, CHRONIC (ICD-784.0) ALLERGIC RHINITIS (ICD-477.9) HYPERTENSION (ICD-401.9) FIBROMYALGIA (ICD-729.1) BACK PAIN, CHRONIC (ICD-724.5) PMH reviewed for relevance  Review of Systems  The patient denies fever, abdominal pain, hematuria, and incontinence.    Physical Exam  General:  Well-developed,well-nourished,in no acute distress; alert,appropriate and cooperative throughout examination Mouth:  mucosa moist and clear. Poor dentition Neck:  No  deformities, masses, or tenderness noted. Lungs:  Normal respiratory effort, chest expands symmetrically. Lungs are clear to auscultation, no crackles or wheezes. Heart:  normal rate and regular rhythm.   Abdomen:  soft, non-tender, no distention, and no masses.     Impression & Recommendations:  Problem # 1:  DYSURIA (ICD-788.1)  Pt presents with dysuria and suprapubic pressure.  Minimally abnl UA.  Treat with Cipro and f/u with primary if sxs persist. Her updated medication list for this problem includes:    Ciprofloxacin Hcl 500 Mg Tabs (Ciprofloxacin hcl) ..... One by mouth two times a day for 5 days  Orders: UA Dipstick w/o Micro (manual) (84132)  Complete Medication List: 1)  Celebrex 200 Mg Caps (Celecoxib) .... Take 1 tablet by mouth once a day and one as needed 2)  Ambien 10 Mg Tabs (Zolpidem tartrate) .... Take 1 tab by mouth at bedtime 3)  Nexium 40 Mg Cpdr (Esomeprazole magnesium) .... Take 1 tablet by mouth two times a day 4)  Darvocet-n 100 100-650 Mg Tabs (Propoxyphene n-apap) .[name].. 1 tablet every 6 hours prn 5)  Imitrex 5 Mg/act Soln (Sumatriptan) .... As needed 6)  Miralax Powd (Polyethylene glycol 3350) .[name].. 1 dose daily 7)  Dicyclomine Hcl 10 Mg Caps (Dicyclomine hcl) .... Take three times a day titrated to symptoms 8)  Furosemide 20 Mg Tabs (Furosemide) .[name].. 1 by mouth once daily for htn 9)  Ciprofloxacin Hcl 500 Mg Tabs (Ciprofloxacin hcl) .... One by mouth  two times a day for 5 days  Patient Instructions: 1)  Drink plenty of fluids up to 3-4 quarts a day. Cranberry juice is especially recommended in addition to large amounts of water. Avoid caffeine & carbonated drinks, they tend to irritate the bladder, Return in 3-5 days if you're not better: sooner if you're feeling worse.  Prescriptions: CIPROFLOXACIN HCL 500 MG TABS (CIPROFLOXACIN HCL) one by mouth two times a day for 5 days  #10 x 0   Entered and Authorized by:   Evelena Peat MD   Signed by:   Evelena Peat MD on 06/07/2010   Method used:   [facility]        [facility]  [name] [address] [facility]       [name]       [place], [place]  [address]       Ph: [phone] or [phone]       Fax: [phone]   RxID:   [id number]    Orders Added: 1)  Est. Patient Level III [[address]] 2)  UA Dipstick w/o Micro (manual) [81002]    Laboratory Results   Urine Tests    Routine Urinalysis   Glucose: negative   (Normal Range: Negative) Bilirubin: negative   (Normal Range: Negative) Ketone: negative   (Normal Range: Negative) Spec. Gravity: 1.015   (Normal Range: 1.003-1.035) Blood: trace-intact   (Normal Range: Negative) pH: 6.0   (Normal Range: 5.0-8.0) Protein: negative   (Normal Range: Negative) Urobilinogen: 0.2   (Normal Range: 0-1) Nitrite: negative   (Normal Range: Negative) Leukocyte Esterace: negative   (Normal Range: Negative)

## 2010-07-30 ENCOUNTER — Encounter: Payer: Self-pay | Admitting: Internal Medicine

## 2010-07-30 ENCOUNTER — Telehealth: Payer: Self-pay | Admitting: Internal Medicine

## 2010-07-31 ENCOUNTER — Other Ambulatory Visit: Payer: No Typology Code available for payment source

## 2010-07-31 ENCOUNTER — Other Ambulatory Visit: Payer: Self-pay | Admitting: Internal Medicine

## 2010-07-31 ENCOUNTER — Encounter: Payer: Self-pay | Admitting: Internal Medicine

## 2010-07-31 ENCOUNTER — Encounter (INDEPENDENT_AMBULATORY_CARE_PROVIDER_SITE_OTHER): Payer: Self-pay | Admitting: *Deleted

## 2010-07-31 DIAGNOSIS — N390 Urinary tract infection, site not specified: Secondary | ICD-10-CM

## 2010-07-31 LAB — URINALYSIS
Bilirubin Urine: NEGATIVE
Hgb urine dipstick: NEGATIVE
Ketones, ur: NEGATIVE
Leukocytes, UA: NEGATIVE
Nitrite: NEGATIVE
Specific Gravity, Urine: 1.015 (ref 1.000–1.030)
Total Protein, Urine: NEGATIVE
Urine Glucose: NEGATIVE
Urobilinogen, UA: 1 (ref 0.0–1.0)
pH: 6.5 (ref 5.0–8.0)

## 2010-08-07 ENCOUNTER — Ambulatory Visit (INDEPENDENT_AMBULATORY_CARE_PROVIDER_SITE_OTHER): Payer: No Typology Code available for payment source | Admitting: Internal Medicine

## 2010-08-07 ENCOUNTER — Encounter: Payer: Self-pay | Admitting: Internal Medicine

## 2010-08-07 DIAGNOSIS — R350 Frequency of micturition: Secondary | ICD-10-CM

## 2010-08-07 DIAGNOSIS — M549 Dorsalgia, unspecified: Secondary | ICD-10-CM

## 2010-08-07 NOTE — Progress Notes (Signed)
Summary: Nexium  Phone Note From Pharmacy   Summary of Call: PA-Nexium, called [facility] @ [phone], awaiting form. Miranda Mathis  July 23, 2010 3:44 PM Form to Dr Debby Bud to complete. Miranda Mathis  July 23, 2010 3:59 PM Faxed to [facility] @ [phone], awaiting approval. Miranda Mathis  July 25, 2010 4:52 PM PA Nexium approved 07/26/10 - 07/26/12, pt aware.  Initial call taken by: Miranda Mathis,  July 30, 2010 3:11 PM

## 2010-08-07 NOTE — Progress Notes (Signed)
Summary: UTI?   Phone Note Other Incoming   Caller: [phone] Summary of Call: Pt states she has another bladder infection. She states this is the 5th one in a couple of months. Pt c/o pain, burning and discomfort urinating.Pt states she is having chills and feel terrible,  what do you advise. Initial call taken by: Ami Bullins CMA,  July 30, 2010 8:23 AM  Follow-up for Phone Call        urinalysis with culture. After specimen submitted Cipro 250mg  two times a day x 5. Will change antibx if necessary when culter and sensitivities back. If not a resistant species will need GU evaluation Follow-up by: Jacques Navy MD,  July 30, 2010 4:13 PM  Additional Follow-up for Phone Call Additional follow up Details #1::        Pt informed  Additional Follow-up by: Lamar Sprinkles, CMA,  July 30, 2010 4:16 PM    New/Updated Medications: CIPRO 250 MG TABS (CIPROFLOXACIN HCL) 1 two times a day x 5 Prescriptions: CIPRO 250 MG TABS (CIPROFLOXACIN HCL) 1 two times a day x 5  #10 x 0   Entered by:   Lamar Sprinkles, CMA   Authorized by:   Jacques Navy MD   Signed by:   Lamar Sprinkles, CMA on 07/30/2010   Method used:   [facility]  [name] [address] [facility]       [name]       [place], [place]  [address]       Ph: [phone] or [phone]       Fax: [phone]   RxID:   [id number]

## 2010-08-08 ENCOUNTER — Other Ambulatory Visit: Payer: Self-pay | Admitting: Internal Medicine

## 2010-08-08 DIAGNOSIS — R350 Frequency of micturition: Secondary | ICD-10-CM

## 2010-08-11 ENCOUNTER — Telehealth: Payer: Self-pay | Admitting: Internal Medicine

## 2010-08-12 ENCOUNTER — Inpatient Hospital Stay: Admission: RE | Admit: 2010-08-12 | Payer: No Typology Code available for payment source | Source: Ambulatory Visit

## 2010-08-13 NOTE — Medication Information (Signed)
Summary: Prior autho & approved for Nexuim/[employer]  Prior autho & approved for Nexuim/[employer]   Imported By: Sherian Rein 08/04/2010 09:38:18  _____________________________________________________________________  External Attachment:    Type:   Image     Comment:   External Document

## 2010-08-13 NOTE — Assessment & Plan Note (Signed)
Summary: Pt here for exam to evaluate urinary symptoms/ ab   Vital Signs:  Patient profile:   60 year old female Menstrual status:  hysterectomy Height:      63 inches Weight:      199.75 pounds BMI:     35.51 O2 Sat:      96 % on Room air Temp:     98.1 degrees F oral Pulse rate:   82 / minute Resp:     16 per minute BP sitting:   124 / 68  (left arm)  Vitals Entered By: Burnard Leigh CMA(AAMA) (August 07, 2010 9:02 AM)  O2 Flow:  Room air CC: Pt here to F/U on Urinary symptoms/sls,cma Is Patient Diabetic? No   Primary Care Provider:  Jacques Navy MD  CC:  Pt here to F/U on Urinary symptoms/sls and cma.  History of Present Illness: Patinet presents for evaluation of intermittent rigors with fever, urinary frequency/urgency, low back and low abdominal pain. she had been to ED Jan 16th - mildly positve U/A, nl CBC, Bmet, CK, and CXR. She was treated for a UTI. She has had two subsequent episodes followed by two U/As both normal. She denies nighjt sweats, weight changes, abdominal distention, change in bowel habit. She is s/p hysterectomy.she denies vaginal dryness.  Current Medications (verified): 1)  Celebrex 200 Mg  Caps (Celecoxib) .... Take 1 Tablet By Mouth Once A Day and One As Needed 2)  Ambien 10 Mg  Tabs (Zolpidem Tartrate) .... Take 1 Tab By Mouth At Bedtime 3)  Nexium 40 Mg  Cpdr (Esomeprazole Magnesium) .... Take 1 Tablet By Mouth Two Times A Day 4)  Darvocet-N 100 100-650 Mg  Tabs (Propoxyphene N-Apap) .[name].. 1 Tablet Every 6 Hours Prn 5)  Imitrex 5 Mg/act  Soln (Sumatriptan) .... As Needed 6)  Miralax  Powd (Polyethylene Glycol 3350) .[name].. 1 Dose Daily Prn 7)  Dicyclomine Hcl 10 Mg Caps (Dicyclomine Hcl) .... Take Three Times A Day Titrated To Symptoms 8)  Furosemide 20 Mg Tabs (Furosemide) .[name].. 1 By Mouth Once Daily For Htn  Allergies (verified): 1)  ! 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 Never Smoked Alcohol use-no Drug use-no Regular exercise-no  Risk Factors: Alcohol Use: 0 (07/07/2010) Exercise: no (08/23/2009)  Risk Factors: Smoking Status: never (07/07/2010)  Review of Systems       The patient complains of fever and abdominal pain.  The patient denies anorexia, weight loss, weight gain, decreased hearing, chest pain, dyspnea on exertion, melena, hematochezia, severe indigestion/heartburn, genital sores, difficulty walking, abnormal bleeding, and enlarged lymph nodes.    Physical Exam  General:  alert, well-developed, well-nourished, and well-hydrated, overweigfht white female in no distress.   Head:  Normocephalic and atraumatic without obvious abnormalities. No apparent alopecia or balding. Eyes:  C&S clear Mouth:  missing several upper teeth right Neck:  supple and full ROM.   Lungs:  normal respiratory effort  and normal breath sounds.   Heart:  normal rate and regular rhythm.   Abdomen:  soft.  No tenderness in the lower quadrants. No masses. Msk:  tender to percussion low spine bilaterally.  Pulses:  2+ raidal Neurologic:  alert & oriented X3, cranial nerves II-XII intact, strength normal in all extremities, and sensation intact to light touch.     Impression & Recommendations:  Problem # 1:  FREQUENCY, URINARY (ICD-788.41) Patient with  possible Irritible bladder. Rigors and fevers from possible upper tract problem: hydronephrosis, stone. alternatively adnexal origin.  Plan - trial of toviaz. if effective will switch to oxybutynin          Abdominal U/S  Orders: Radiology Referral (Radiology)  Problem # 2:  BACK PAIN, CHRONIC (ICD-724.5) chronic back pain and s/p diskectomy. Question of rigors and fevers with increased pain as possible diskitis.  Plan - if above studies negative will need to consider imaging of the back.  Her updated medication list for this problem includes:    Celebrex 200 Mg Caps (Celecoxib) .[name]... Take 1 tablet by mouth once a day and one as needed    Darvocet-n 100 100-650 Mg Tabs (Propoxyphene n-apap) .[name]... 1 tablet every 6 hours prn  Complete Medication List: 1)  Celebrex 200 Mg Caps (Celecoxib) .... Take 1 tablet by mouth once a day and one as needed 2)  Ambien 10 Mg Tabs (Zolpidem tartrate) .... Take 1 tab by mouth at bedtime 3)  Nexium 40 Mg Cpdr (Esomeprazole magnesium) .... Take 1 tablet by mouth two times a day 4)  Darvocet-n 100 100-650 Mg Tabs (Propoxyphene n-apap) .[name].. 1 tablet every 6 hours prn 5)  Imitrex 5 Mg/act Soln (Sumatriptan) .... As needed 6)  Miralax Powd (Polyethylene glycol 3350) .[name].. 1 dose daily prn 7)  Dicyclomine Hcl 10 Mg Caps (Dicyclomine hcl) .... Take three times a day titrated to symptoms 8)  Furosemide 20 Mg Tabs (Furosemide) .[name].. 1 by mouth once daily for htn   Orders Added: 1)  Radiology Referral [Radiology] 2)  Est. Patient Level III [16109]   Immunization History:  Influenza Immunization History:    Influenza:  historical (03/22/2010)   Immunization History:  Influenza Immunization History:    Influenza:  Historical (03/22/2010)

## 2010-08-19 NOTE — Progress Notes (Signed)
Summary: u/s  Phone Note Call from Patient Call back at Home Phone [phone] Call back at Work Phone [phone]   Summary of Call: Meds given for bladder have helped - should she keep apt for u/s tomorrow? If no, she needs to know by 5 today so she cancel.  Initial call taken by: Lamar Sprinkles, CMA,  August 11, 2010 3:22 PM  Follow-up for Phone Call        PER MD -OK to cx apt and call in rx in place of toviaz. Pt informed  Follow-up by: Lamar Sprinkles, CMA,  August 11, 2010 3:37 PM  Additional Follow-up for Phone Call Additional follow up Details #1::        yep Additional Follow-up by: Jacques Navy MD,  August 11, 2010 5:35 PM    New/Updated Medications: OXYBUTYNIN CHLORIDE 5 MG TABS (OXYBUTYNIN CHLORIDE) 1 two times a day Prescriptions: OXYBUTYNIN CHLORIDE 5 MG TABS (OXYBUTYNIN CHLORIDE) 1 two times a day  #180 x 1   Entered by:   Lamar Sprinkles, CMA   Authorized by:   Jacques Navy MD   Signed by:   Lamar Sprinkles, CMA on 08/11/2010   Method used:   Electronically to        [name] [address] (retail)       [name]       [place], [place]  [address]       Ph: [phone] or [phone]       Fax: [phone]   RxID:   [id number]

## 2010-09-09 ENCOUNTER — Telehealth: Payer: Self-pay | Admitting: *Deleted

## 2010-09-09 DIAGNOSIS — Z0000 Encounter for general adult medical examination without abnormal findings: Secondary | ICD-10-CM

## 2010-09-09 DIAGNOSIS — Z1239 Encounter for other screening for malignant neoplasm of breast: Secondary | ICD-10-CM

## 2010-09-09 NOTE — Telephone Encounter (Signed)
My patient - ok for cpx labs and referral as requested for mammogram

## 2010-09-09 NOTE — Telephone Encounter (Signed)
Patient is requesting an order for labs for her cpx . She is also asking for a referral to [facility] for her mammogram.

## 2010-09-09 NOTE — Telephone Encounter (Signed)
?   This is Dr Alvera Novel pt?

## 2010-09-10 NOTE — Telephone Encounter (Signed)
IN addition to mammogram order please put cpx labs in, pt will be contacted to be set up for CPX apt.

## 2010-09-10 NOTE — Telephone Encounter (Signed)
This was entered in error

## 2010-09-10 NOTE — Telephone Encounter (Signed)
Please put in referral for mammogram. Thanks

## 2010-09-11 NOTE — Telephone Encounter (Signed)
Addended by: Jacques Navy on: 09/11/2010 09:18 AM   Modules accepted: Orders

## 2010-10-27 ENCOUNTER — Other Ambulatory Visit: Payer: Self-pay | Admitting: Internal Medicine

## 2010-11-03 ENCOUNTER — Other Ambulatory Visit: Payer: No Typology Code available for payment source

## 2010-11-03 ENCOUNTER — Other Ambulatory Visit: Payer: Self-pay | Admitting: Internal Medicine

## 2010-11-03 DIAGNOSIS — Z0000 Encounter for general adult medical examination without abnormal findings: Secondary | ICD-10-CM

## 2010-11-07 NOTE — Op Note (Signed)
[name], Miranda Mathis                            ACCOUNT NO.:  [id number]   MEDICAL RECORD NO.:  [id number]                   PATIENT TYPE:  AMB   LOCATION:  [facility]                                  FACILITY:  [facility]   PHYSICIAN:  Leonides Grills, M.D.                  DATE OF BIRTH:  14-Oct-1950   DATE OF PROCEDURE:  06/13/2002  DATE OF DISCHARGE:                                 OPERATIVE REPORT   PREOPERATIVE DIAGNOSIS:  1. Right Haglund's deformity.  2. Right tight gastroc.   POSTOPERATIVE DIAGNOSIS:  1. Right Haglund's deformity.  2. Right tight gastroc.   OPERATION PERFORMED:  1. Right Haglund's deformity excision.  2. Right gastroc slide.   SURGEON:  Leonides Grills, M.D.   ASSISTANT:  Lianne Cure, P.A.   ANESTHESIA:  General endotracheal.   ESTIMATED BLOOD LOSS:  Minimal.   TOURNIQUET TIME:  Approximately 45 minutes.   COMPLICATIONS:  None.   DISPOSITION:  Stable to the PR.   INDICATIONS FOR PROCEDURE:  This is a 60 year old female who has had  longstanding posterior heel pain that has been resistant to conservative  management. She has consented for the above procedure.  All risks which  include infection, neurovascular injury, persistent pain, worsening pain,  possible, flexor hallucis longus transfer to calcaneous for Achilles  tendinopathy, Achilles tendon rupture were all explained.  Questions were  encouraged and answered.   DESCRIPTION OF PROCEDURE:  The patient was brought to the operating room and  placed in supine position initially.  After adequate general endotracheal  tube anesthesia was administered as well as an ankle block and Ancef 1 gm IV  piggyback was administered.  Once a proximal thigh high tourniquet was  placed on the right lower extremity, the patient was placed in a prone  position.  All bony prominences were well padded.  The right lower extremity  was then prepped and draped in a sterile manner.  We started the procedure  with a  longitudinal incision over the medial gastroc musculotendinous  junction and dissection was carried down to the fascia and hemostasis was  obtained.  The fascia was opened in line with the incision.  The conjoined  region of the gastroc-soleus was then developed.  The soft tissues were then  developed off the posterior aspect of the gastroc tendon.  The sural nerve  was palpated and identified and procedure.  The gastroc tendon was then  released with the Mayo scissors.  This had excellent release of the tight  gastroc.  The wound was copiously irrigated with normal saline.  The subcu  was closed with 3-0 Vicryl, the skin was closed with 4-0 Monocryl  subcuticular stitch.  We then gravity exsanguinated the right lower  extremity.  The tourniquet was elevated 290 mmHg.  We then made a  longitudinal incision for the lateral aspect of the Achilles tendon.  Dissection  was started anterolateral to the Achilles tendon to the calcaneal  tuber.  The retrocalcaneal bursa was then removed with a rongeur.  The soft  tissues were developed around the calcaneal tuber and then with a sagittal  saw, the calcaneal tuber was then resected to the Achilles tendon insertion  area.  The edges were then rounded off.  The wound was then copiously  irrigated with normal saline.  Tourniquet deflated, hemostasis obtained.  Skin was closed with 4-0 nylon.  Sterile dressing was applied.  Jones  dressing was applied with ankle neutral dorsiflexion.  Patient stable to PR.                                               Leonides Grills, M.D.    PB/MEDQ  D:  06/13/2002  T:  06/14/2002  Job:  [id number]

## 2010-11-12 ENCOUNTER — Encounter: Payer: Self-pay | Admitting: Internal Medicine

## 2010-11-13 ENCOUNTER — Encounter: Payer: Self-pay | Admitting: Internal Medicine

## 2010-11-13 ENCOUNTER — Other Ambulatory Visit (INDEPENDENT_AMBULATORY_CARE_PROVIDER_SITE_OTHER): Payer: No Typology Code available for payment source | Admitting: Internal Medicine

## 2010-11-13 ENCOUNTER — Ambulatory Visit (INDEPENDENT_AMBULATORY_CARE_PROVIDER_SITE_OTHER): Payer: No Typology Code available for payment source | Admitting: Internal Medicine

## 2010-11-13 ENCOUNTER — Other Ambulatory Visit (INDEPENDENT_AMBULATORY_CARE_PROVIDER_SITE_OTHER): Payer: No Typology Code available for payment source

## 2010-11-13 VITALS — BP 128/84 | HR 83 | Temp 98.1°F | Wt 195.0 lb

## 2010-11-13 DIAGNOSIS — R899 Unspecified abnormal finding in specimens from other organs, systems and tissues: Secondary | ICD-10-CM

## 2010-11-13 DIAGNOSIS — Z136 Encounter for screening for cardiovascular disorders: Secondary | ICD-10-CM

## 2010-11-13 DIAGNOSIS — Z1211 Encounter for screening for malignant neoplasm of colon: Secondary | ICD-10-CM

## 2010-11-13 DIAGNOSIS — Z23 Encounter for immunization: Secondary | ICD-10-CM

## 2010-11-13 DIAGNOSIS — Z13220 Encounter for screening for lipoid disorders: Secondary | ICD-10-CM

## 2010-11-13 DIAGNOSIS — Z79899 Other long term (current) drug therapy: Secondary | ICD-10-CM

## 2010-11-13 DIAGNOSIS — Z0000 Encounter for general adult medical examination without abnormal findings: Secondary | ICD-10-CM

## 2010-11-13 DIAGNOSIS — R6889 Other general symptoms and signs: Secondary | ICD-10-CM

## 2010-11-13 LAB — BASIC METABOLIC PANEL
BUN: 14 mg/dL (ref 6–23)
Calcium: 8.9 mg/dL (ref 8.4–10.5)
Chloride: 108 mEq/L (ref 96–112)
Creatinine, Ser: 0.8 mg/dL (ref 0.4–1.2)

## 2010-11-13 LAB — HEPATIC FUNCTION PANEL
ALT: 37 U/L — ABNORMAL HIGH (ref 0–35)
Albumin: 3.8 g/dL (ref 3.5–5.2)
Total Protein: 6.6 g/dL (ref 6.0–8.3)

## 2010-11-13 LAB — TSH: TSH: 6.92 u[IU]/mL — ABNORMAL HIGH (ref 0.35–5.50)

## 2010-11-13 LAB — CBC WITH DIFFERENTIAL/PLATELET
Eosinophils Relative: 1.9 % (ref 0.0–5.0)
Lymphocytes Relative: 29.2 % (ref 12.0–46.0)
Monocytes Absolute: 0.4 10*3/uL (ref 0.1–1.0)
Monocytes Relative: 6.3 % (ref 3.0–12.0)
Neutrophils Relative %: 62.2 % (ref 43.0–77.0)
Platelets: 175 10*3/uL (ref 150.0–400.0)
WBC: 6 10*3/uL (ref 4.5–10.5)

## 2010-11-13 LAB — LIPID PANEL
Cholesterol: 201 mg/dL — ABNORMAL HIGH (ref 0–200)
Total CHOL/HDL Ratio: 5
Triglycerides: 81 mg/dL (ref 0.0–149.0)

## 2010-11-13 LAB — LDL CHOLESTEROL, DIRECT: Direct LDL: 165.3 mg/dL

## 2010-11-13 MED ORDER — TETANUS-DIPHTH-ACELL PERTUSSIS 5-2.5-18.5 LF-MCG/0.5 IM SUSP
0.5000 mL | Freq: Once | INTRAMUSCULAR | Status: AC
  Administered 2010-11-13: 0.5 mL via INTRAMUSCULAR

## 2010-11-13 MED ORDER — PNEUMOCOCCAL VAC POLYVALENT 25 MCG/0.5ML IJ INJ
0.5000 mL | INJECTION | Freq: Once | INTRAMUSCULAR | Status: AC
  Administered 2010-11-13: 0.5 mL via INTRAMUSCULAR

## 2010-11-13 NOTE — Progress Notes (Signed)
Subjective:    Patient ID: Miranda Mathis, female    DOB: 12-17-1950, 61 y.o.   MRN: [id number]  HPI Miranda Mathis presents today for routine medical exam. She reports that she is feeling well and doing well with no specific medical complaints at today's visit.    Past Medical History  Diagnosis Date  . Headache   . Allergic rhinitis, cause unspecified   . Unspecified essential hypertension   . Myalgia and myositis, unspecified   . Backache, unspecified    Past Surgical History  Procedure Date  . Cesarean section   . Abdominal hysterectomy   . Lumbar disc surgery   . Right heel surgery    Family History  Problem Relation Age of Onset  . COPD Mother   . Emphysema Mother   . Osteoarthritis Mother   . Cancer Father     Prostate  . Coronary artery disease Paternal Grandfather    History   Social History  . Marital Status: Married    Spouse Name: N/A    Number of Children: 2  . Years of Education: 13   Occupational History  . LPN    Social History Main Topics  . Smoking status: Never Smoker   . Smokeless tobacco: Never Used  . Alcohol Use: 0.5 oz/week    1 drink(s) per week  . Drug Use: No  . Sexually Active: Yes -- Female partner(s)   Other Topics Concern  . 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 1 daughter - '83 trisomy 13 defect; 1 son - '87, offers a road-side service; 1 grandson; 1 step -son.  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       Review of Systems Review of Systems  Constitutional:  Negative for fever, chills, activity change and unexpected weight change.  HENT:  Negative for hearing loss, ear pain, congestion, neck stiffness and postnasal drip.   Eyes: Negative for pain, discharge and visual disturbance.  Respiratory: Negative for chest tightness and wheezing.   Cardiovascular: Negative for chest pain and palpitations.       [No decreased exercise tolerance Gastrointestinal:  [No change in bowel habit. No bloating or gas. No reflux or indigestion Genitourinary: Negative for urgency, frequency, flank pain and difficulty urinating.  Musculoskeletal: Negative for myalgias, back pain, arthralgias and gait problem.  Neurological: Negative for dizziness, tremors, weakness and headaches.  Hematological: Negative for adenopathy.  Psychiatric/Behavioral: Negative for behavioral problems and dysphoric mood.       Objective:   Physical Exam Physical Exam  Constitutional: She is oriented to person, place, and time. Vital signs are normal. She appears well-developed and well-nourished.    Very pleasant white woman in no distress  HEENT:  Oropharynx -missing several teeth maxilla right, no gum disease, no lesions buccal membranes, palate or posterior pharynx Head: Normocephalic and atraumatic.  Right Ear: External ear normal.  Left Ear: External ear normal.       EACs and TMs normal  Eyes: Conjunctivae and EOM are normal. Pupils are equal, round, and reactive to light. No scleral icterus.  Neck: Normal range of motion. Neck supple. No JVD present. No thyromegaly present.  Cardiovascular: Normal rate, regular rhythm and normal heart sounds.  Exam reveals no friction rub.   No murmur heard.      Radial and Dorsalis Pedis pulses normal  Pulmonary/Chest: Effort normal and breath sounds normal. No respiratory distress. She  has no wheezes. She has no rales.       No chest wall deformity with normal A-P diameter. Breast exam: skin normal, no nipple discharge, no fixed masses or abnormalities, no axillary nodes or masses  Abdominal: Soft. Bowel sounds are normal. She exhibits no distension and no mass. There is no tenderness. There is no guarding.       No hepatosplenomegaly  Genitourinary: deferred due to patient having had hysterectomy    Musculoskeletal: Normal range of motion. She exhibits no edema and no tenderness.       No joint swelling, no synovial thickening, no  deformity of the small, medium or large joints.  Lymphadenopathy:    She has no cervical adenopathy.  Neurological: She is alert and oriented to person, place, and time. She has normal reflexes. No cranial nerve deficit. Coordination normal.       Normal facial symmetry, normal gross motor strength throughout, no tremor or cogwheeling, normal rapid finger movement.  Skin: Skin is warm and dry. No rash noted. No erythema.       No suspicious lesions. Normal turgor. Nails are normal.  Psychiatric: She has a normal mood and affect. Her behavior is normal. Thought content normal.       Normal recall   Lab Results  Component Value Date   WBC 6.0 11/13/2010   HGB 13.9 11/13/2010   HCT 40.5 11/13/2010   PLT 175.0 11/13/2010   CHOL 201* 11/13/2010   TRIG 81.0 11/13/2010   HDL 41.20 11/13/2010   LDLDIRECT 165.3 11/13/2010   ALT 37* 11/13/2010   AST 30 11/13/2010   NA 141 11/13/2010   K 4.3 11/13/2010   CL 108 11/13/2010   CREATININE 0.8 11/13/2010   BUN 14 11/13/2010   CO2 27 11/13/2010   TSH 6.92* 11/13/2010           Assessment & Plan:  1. Hypertension - well controlled without medications  2. Fibromyalgia - off pain meds except for celebrex as needed.   3. Health maintenance - interval history normal; physical exam negative. Due for colonoscopy with Dr. Leone Payor. Due for mammogram which she will schedule. Labs are pending. Immunizations: Tdap and pneumonia vaccine today. She will check on coverage for shingles vaccine. 12 lead EKG is normal without sign of injury or ischemia.   In summary - a very nice woman who appears to be medically stable. She will return prn or for full medical exam in 2 years, limited exam in 1 year.   Addendum: lab results reveal elevated LDL cholesterol - into the treatment range. TSH is mildly elevated but not to a level that requires treatment.  Plan -  For treatment of lipids will first attempt life-style management: a low fat diet and regular exercise. Recheck  cholesterol panel in 6 months (order entered.)

## 2010-11-14 ENCOUNTER — Encounter: Payer: Self-pay | Admitting: Internal Medicine

## 2010-12-31 ENCOUNTER — Telehealth: Payer: Self-pay | Admitting: *Deleted

## 2010-12-31 MED ORDER — OXYBUTYNIN CHLORIDE 5 MG PO TABS: 5.0000 mg | ORAL_TABLET | Freq: Three times a day (TID) | ORAL | Status: DC

## 2010-12-31 NOTE — Telephone Encounter (Signed)
My increase oxybutynin to 5 mg tid

## 2010-12-31 NOTE — Telephone Encounter (Signed)
Patient informed. 

## 2010-12-31 NOTE — Telephone Encounter (Signed)
Patient requesting to increase oxybuytin - she continues to have symptoms. Please advise.

## 2011-01-02 ENCOUNTER — Other Ambulatory Visit: Payer: No Typology Code available for payment source | Admitting: Internal Medicine

## 2011-01-23 ENCOUNTER — Other Ambulatory Visit (INDEPENDENT_AMBULATORY_CARE_PROVIDER_SITE_OTHER): Payer: No Typology Code available for payment source

## 2011-01-23 ENCOUNTER — Ambulatory Visit (HOSPITAL_COMMUNITY)
Admission: RE | Admit: 2011-01-23 | Discharge: 2011-01-23 | Disposition: A | Discharge status: Home / Self Care | Payer: No Typology Code available for payment source | Source: Ambulatory Visit | Attending: Internal Medicine | Admitting: Internal Medicine

## 2011-01-23 ENCOUNTER — Other Ambulatory Visit: Payer: Self-pay | Admitting: Internal Medicine

## 2011-01-23 ENCOUNTER — Ambulatory Visit (INDEPENDENT_AMBULATORY_CARE_PROVIDER_SITE_OTHER): Payer: No Typology Code available for payment source | Admitting: Internal Medicine

## 2011-01-23 DIAGNOSIS — N1330 Unspecified hydronephrosis: Secondary | ICD-10-CM | POA: Insufficient documentation

## 2011-01-23 DIAGNOSIS — N390 Urinary tract infection, site not specified: Secondary | ICD-10-CM

## 2011-01-23 DIAGNOSIS — R1031 Right lower quadrant pain: Secondary | ICD-10-CM | POA: Insufficient documentation

## 2011-01-23 DIAGNOSIS — Z90710 Acquired absence of both cervix and uterus: Secondary | ICD-10-CM | POA: Insufficient documentation

## 2011-01-23 DIAGNOSIS — Z9089 Acquired absence of other organs: Secondary | ICD-10-CM | POA: Insufficient documentation

## 2011-01-23 DIAGNOSIS — IMO0001 Reserved for inherently not codable concepts without codable children: Secondary | ICD-10-CM

## 2011-01-23 DIAGNOSIS — R1032 Left lower quadrant pain: Secondary | ICD-10-CM

## 2011-01-23 LAB — URINALYSIS, ROUTINE W REFLEX MICROSCOPIC
Urobilinogen, UA: >=8 (ref 0.0–1.0)
pH: 5.5 (ref 5.0–8.0)

## 2011-01-23 MED ORDER — IOHEXOL 300 MG/ML  SOLN
100.0000 mL | Freq: Once | INTRAMUSCULAR | Status: AC | PRN
  Administered 2011-01-23: 100 mL via INTRAVENOUS

## 2011-01-24 DIAGNOSIS — R1031 Right lower quadrant pain: Secondary | ICD-10-CM | POA: Insufficient documentation

## 2011-01-24 MED ORDER — KETOROLAC TROMETHAMINE 10 MG PO TABS: 10.0000 mg | ORAL_TABLET | Freq: Four times a day (QID) | ORAL | Status: DC | PRN

## 2011-01-24 NOTE — Assessment & Plan Note (Signed)
^   months h/o intermittent RLQ abdominal pain that is getting worse. Tender on exam. Hgb in urine with few RBCs  Plan - CT abd/pelvis on an emergent basis.  Addendum: Ct with prox-mid right ureter dilation to the sacral ureter where there is an abrupt narrowing: stone vs stricture vs neoplasm.  Plan - pain mgt           Referral to Urology

## 2011-01-24 NOTE — Progress Notes (Signed)
  Subjective:    Patient ID: Miranda Mathis, female    DOB: March 02, 1951, 60 y.o.   MRN: [id number]  HPI Miranda Mathis presents with a h/o right lower abdominal/groin pain since January. She had an ED visit which was unrevealing as to the cause of her discomfort. She has had urinary frequency and urgency, mild dysuria and low count hematuria. She does take oxyb utynin 5 mg tid for irritable bladder. She reports that the pain has been getting worse such that it keeps her up at night and is almost disabling during the day when it strike. She has had no fever or chills until recently when she had rigors w/o fever, low back pain as well as RLQ pain. U/A today with large Hgb on dip and -2 RBCs on micro.   I have reviewed the patient's medical history in detail and updated the computerized patient record.    Review of Systems Constitutional - no fever, no weight loss, no sweats. Positive for rigors Cor - RRR Abd - BS hypoactive, no guarding. Very tender to palpation at the right groin at the midline. Very tender to deep palpation LLQ    Objective:   Physical Exam        Assessment & Plan:

## 2011-01-25 LAB — CULTURE, URINE COMPREHENSIVE
Colony Count: NO GROWTH
Organism ID, Bacteria: NO GROWTH

## 2011-02-02 ENCOUNTER — Other Ambulatory Visit: Payer: Self-pay | Admitting: Internal Medicine

## 2011-02-02 NOTE — Telephone Encounter (Signed)
Please advise 

## 2011-02-04 NOTE — Telephone Encounter (Signed)
Ok for refill prn 

## 2011-02-23 ENCOUNTER — Other Ambulatory Visit: Payer: Self-pay | Admitting: Internal Medicine

## 2011-03-08 ENCOUNTER — Ambulatory Visit (HOSPITAL_COMMUNITY)
Admission: RE | Admit: 2011-03-08 | Discharge: 2011-03-08 | Disposition: A | Discharge status: Home / Self Care | Payer: No Typology Code available for payment source | Source: Ambulatory Visit | Attending: Urology | Admitting: Urology

## 2011-03-08 ENCOUNTER — Other Ambulatory Visit (HOSPITAL_COMMUNITY): Payer: Self-pay | Admitting: Urology

## 2011-03-08 DIAGNOSIS — Z01818 Encounter for other preprocedural examination: Secondary | ICD-10-CM

## 2011-03-08 DIAGNOSIS — N1330 Unspecified hydronephrosis: Secondary | ICD-10-CM | POA: Insufficient documentation

## 2011-03-08 DIAGNOSIS — Z9089 Acquired absence of other organs: Secondary | ICD-10-CM | POA: Insufficient documentation

## 2011-03-13 ENCOUNTER — Other Ambulatory Visit: Payer: Self-pay | Admitting: Urology

## 2011-03-13 ENCOUNTER — Ambulatory Visit (HOSPITAL_BASED_OUTPATIENT_CLINIC_OR_DEPARTMENT_OTHER)
Admission: RE | Admit: 2011-03-13 | Discharge: 2011-03-13 | Disposition: A | Discharge status: Home / Self Care | Payer: No Typology Code available for payment source | Source: Ambulatory Visit | Attending: Urology | Admitting: Urology

## 2011-03-13 DIAGNOSIS — N1330 Unspecified hydronephrosis: Secondary | ICD-10-CM | POA: Insufficient documentation

## 2011-03-13 DIAGNOSIS — Z01812 Encounter for preprocedural laboratory examination: Secondary | ICD-10-CM | POA: Insufficient documentation

## 2011-03-13 DIAGNOSIS — N135 Crossing vessel and stricture of ureter without hydronephrosis: Secondary | ICD-10-CM | POA: Insufficient documentation

## 2011-03-13 DIAGNOSIS — N210 Calculus in bladder: Secondary | ICD-10-CM | POA: Insufficient documentation

## 2011-03-13 DIAGNOSIS — N3080 Other cystitis without hematuria: Secondary | ICD-10-CM | POA: Insufficient documentation

## 2011-03-13 DIAGNOSIS — K219 Gastro-esophageal reflux disease without esophagitis: Secondary | ICD-10-CM | POA: Insufficient documentation

## 2011-03-13 HISTORY — PX: OTHER SURGICAL HISTORY: SHX169

## 2011-03-13 LAB — POCT HEMOGLOBIN-HEMACUE: Hemoglobin: 13.7 g/dL (ref 12.0–15.0)

## 2011-03-16 NOTE — Op Note (Addendum)
Miranda Mathis, LEVEL                  ACCOUNT NO.:  [id number]  MEDICAL RECORD NO.:  [id number]  LOCATION:                               FACILITY:  [facility]  PHYSICIAN:  Jerilee Field, MD   DATE OF BIRTH:  07-28-1950  DATE OF PROCEDURE: DATE OF DISCHARGE:                              OPERATIVE REPORT   PREOPERATIVE DIAGNOSES: 1. Right hydronephrosis. 2. Right ureteral stricture. 3. Left ureteral enhancement on CT.  POSTOPERATIVE DIAGNOSES: 1. Right hydroureteronephrosis. 2. Right ureteral stricture. 3. Left ureteral inflammation/cystitis cystica. 4. Small bladder stone.  PROCEDURES: 1. Exam under anesthesia. 2. Cystoscopy with bladder stone removal, left retrograde pyelogram,     and right retrograde pyelogram. 3. Left ureteroscopy with brush biopsy and cold cup biopsy. 4. Right ureteroscopy with brush biopsy. 5. Bilateral ureteral stent placements.  SURGEON:  Jerilee Field, MD.  TYPE OF ANESTHESIA:  General.  INDICATIONS FOR PROCEDURE:  Ms. Pulse is a 60 year old female who has been having right flank and right lower quadrant pain on and off for several months.  A CT scan was obtained which showed moderate right hydroureteronephrosis down and a dilated ureter down to just past the iliac vessels where the ureter sharply tapered and was enhancing. Ureter appeared to enhance for about 2 cm.  There was no stone visualized and the ureter distal to this narrowed area appeared normal on CT without stone.  The bladder appeared normal.  On a repeat CT hematuria protocol, again the findings on the right were seen in the right distal ureter.  Contrast did pass through the area, very distal ureter on the left showed some mild enhancement and ureteral wall thickening, but there was no dilation or hydronephrosis on the left. Therefore, I discussed with the patient, the nature of cystoscopy, bilateral retrogrades, bilateral ureteroscopy with biopsies and stent placement.  All  questions were answered.  We discussed the nature, risks, benefits, and alternatives to these procedures including nontreatment and she elected to proceed.  FINDINGS:  On exam under anesthesia, she had a normal abdomen without masses.  The vagina appeared normal without lesions, palpably the bladder was normal without masses.  She had a prior hysterectomy and a short vaginal cuff without masses.  The urethra felt normal without masses.  On cystoscopy, the urethra was normal and the bladder was normal without tumor or foreign body.  The bladder was examined with a 12 and 70 degrees lens in its entirety.  Clear efflux was seen from both the left and the right ureteral orifice.  There was a small stone in the bladder and this was collected.  It actually broke up into several pieces, but a very minute fragment of that was collected and be submitted for pathology.  The retrograde will be dictated.  Next, on left ureteroscopy, the intramural ureter appeared normal.  The left distal ureter simply had some raised bumpy mucosa that appeared to be cystitis cystica.  This was brushed and biopsies and photographs.  On right ureteroscopy, the right intramural ureter and right distal ureter appeared normal.  The right distal ureter was smooth, but very narrow and the scope could not be advanced.  LEFT RETROGRADE  PYELOGRAM INTERPRETATION:  On scout imaging, there were no calcifications over the left kidney, ureter, or bladder.  After injection of contrast, the left ureter appeared to have a normal caliber.  The calyces on the left were normal without filling defect. The renal pelvis was normal without filling defect or dilation and the ureter appeared normal without filling defect or dilation, and had a gradual narrowed tapering down to the left UVJ.  There was brisk efflux of contrast from the left side.  RIGHT RETROGRADE PYELOGRAM:  On scout imaging, there were no calcifications in the region of  the right kidney, ureter, or bladder. Retrograde injection of contrast, the right distal ureter appeared normal and then narrowed abruptly to a very narrow 1-2 cm segment and just below the iliac vessels just under the pelvic inlet.  This area was very narrow and above this, the ureter was moderately dilated all the way up toward the kidney.  I was never able to fully distend the renal pelvis and calyces, but these appeared normal on the CAT scan without filling defect, although dilated.  The proximal and midureter was dilated without filling defect down to this narrowed area, and the right distal ureter that only allowed a slender column of contrast through, and again distal to this, the ureter was normal down to the bladder.  DESCRIPTION OF PROCEDURE:  After consent was obtained, the patient was brought to the operating room, time-out was performed confirming the patient and procedure.  After adequate anesthesia, SCDs, and antibiotics, she was placed in lithotomy, and exam under anesthesia was performed with previous findings dictated.  She was prepped and draped in the usual fashion.  Rigid cystoscope was passed per urethra.  The bladder was examined in its entirety as previously dictated and was normal apart from the small stone.  Next, using a cone-tipped catheter, left retrograde pyelogram was obtained with findings previously dictated.  In a similar fashion, right retrograde was obtained with a cone tip catheter and retrograde injection of contrast with findings previously dictated.  Next, a semi-rigid ureteroscope was advanced through the left intramural ureter.  It was quite tight, so I passed a sensor wire just inside the distal ureter and followed this in to the distal ureter with the scope and removed the wire.  The uterus again was bumpy and was biopsied with a brush biopsy and then the Piranha forceps cold cup biopsy was taken although this tissue was small.  There was some  mild bleeding.  Therefore, the sensor wire was advanced and coiled in the upper pole and the semi-rigid ureteroscope was removed.  Once approaching the midureter on the left toward the iliac vessels, the ureter appeared to be normal with smooth mucosa.  The sensor wire was back loaded on cystoscope and a 6 x 24 left ureteral stent was placed. The wire was removed.  Good coil was seen in the left kidney, good coil in the bladder.  Attention was then turned to the right ureter.  The semi-rigid was advanced through the right ureteral orifice into the right distal ureter.  Right distal ureter appeared normal, smooth mucosal was quite tight.  I could not get the scope up toward the area of interest.  Therefore, a brush was passed through the strictured area, opened and this area was brushed, and this will be sent to Pathology.  A second retrograde contrast injection was used to guide the brush and then a sensor wire was coiled in the right upper pole.  The scope was removed and the wire was back loaded on the cystoscope and a right 6 x 26 cm stent was placed.  A good coil was seen in the kidney and a good coil in the bladder.  The bladder was drained and the scope was removed.  SPECIMENS: 1. Small bladder stone. 2. Left ureteral breast biopsy. 3. Left ureteral cold cup. 4. Right ureteral breast biopsy.  These specimens were to Pathology.  ESTIMATED BLOOD LOSS:  Less than 5 mL.  COMPLICATIONS:  None.  DISPOSITION:  The patient is stable to PACU.  I will see you back in 1-2 weeks.  We will plan to come back to the OR and remove the left stent and then perform a staged right ureteroscopy with biopsy after the ureter passively dilates.          ______________________________ Jerilee Field, MD     ME/MEDQ  D:  03/13/2011  T:  03/13/2011  Job:  [id number]  Electronically Signed by Jerilee Field MD on 04/13/2011 05:05:00 PM

## 2011-03-31 ENCOUNTER — Other Ambulatory Visit: Payer: Self-pay | Admitting: Internal Medicine

## 2011-04-17 ENCOUNTER — Other Ambulatory Visit: Payer: Self-pay | Admitting: Urology

## 2011-04-17 ENCOUNTER — Ambulatory Visit (HOSPITAL_BASED_OUTPATIENT_CLINIC_OR_DEPARTMENT_OTHER)
Admission: RE | Admit: 2011-04-17 | Discharge: 2011-04-17 | Disposition: A | Discharge status: Home / Self Care | Payer: No Typology Code available for payment source | Source: Ambulatory Visit | Attending: Urology | Admitting: Urology

## 2011-04-17 DIAGNOSIS — N135 Crossing vessel and stricture of ureter without hydronephrosis: Secondary | ICD-10-CM | POA: Insufficient documentation

## 2011-04-17 DIAGNOSIS — R109 Unspecified abdominal pain: Secondary | ICD-10-CM | POA: Insufficient documentation

## 2011-04-17 DIAGNOSIS — K219 Gastro-esophageal reflux disease without esophagitis: Secondary | ICD-10-CM | POA: Insufficient documentation

## 2011-04-17 DIAGNOSIS — N1330 Unspecified hydronephrosis: Secondary | ICD-10-CM | POA: Insufficient documentation

## 2011-04-17 HISTORY — PX: OTHER SURGICAL HISTORY: SHX169

## 2011-04-29 NOTE — Op Note (Signed)
Miranda Mathis, KAHLER                  ACCOUNT NO.:  [id number]  MEDICAL RECORD NO.:  [id number]  LOCATION:                               FACILITY:  [facility]  PHYSICIAN:  Jerilee Field, MD   DATE OF BIRTH:  Sep 15, 1950  DATE OF PROCEDURE: DATE OF DISCHARGE:                              OPERATIVE REPORT   PREOPERATIVE DIAGNOSES:  Right ureteral stricture and right hydronephrosis.  POSTOPERATIVE DIAGNOSES:  Right ureteral stricture and right hydronephrosis.  PROCEDURE:  Exam under anesthesia; cystoscopy; right ureteroscopy; right retrograde pyelogram;  brush biopsy; and ureteral biopsy, cold cup; and stent placement.  SURGEON:  Jerilee Field, MD  TYPE OF ANESTHESIA:  General.  INDICATION FOR PROCEDURE:  Miranda Mathis is a 60 year old, presented with right flank pain.  CT scan revealed a right hydronephrosis down to a distal ureteral stricture that was 1-2 cm in length, and she had no gross hematuria.  Cytology was negative.  A CT hematuria protocol with IV contrast confirmed the stricture although a thread of contrast did pass through the stricture.  Interestingly, the left distal ureter was noted to slightly enhance and possibly narrowing near the left UVJ. Therefore, she was brought to the operating room several weeks ago.  She underwent left breast biopsies which were benign and a stent was placed, just underwent a right ureteroscopy with the right distal ureter distal to the stricture was too narrow to allow the scope to pass for inspection and a blind brush biopsy was equivocal, could not rule out a low-grade neoplasm.  Therefore, I discussed all these findings with the patient.  We discussed the nature of risks and benefits of cystoscopy today with left ureteral stent removal as the left side was consistent with normal ureter with some mild inflammation on ureteroscopy and biopsy; and we discussed right ureteroscopy, the retrogrades, brushing, and biopsy since stent  placement.  In total, ultimately we would like to intervene on the right ureter with either a balloon dilation, endoureterotomy and reimplant, and I was hesitant to do that until we knew for certain there was no cancer.  All questions were answered and the patient elects to proceed.  FINDINGS:  On exam under anesthesia, the patient had normal bimanual exam.  There were no abdominal masses.  The urethra was palpably normal. The bladder was normal and the vaginal cuff was palpated consistent with prior hysterectomy and there were no masses.  On cystoscopy, the bladder was normal.  On right ureteroscopy, the right distal ureter was normal and just below the iliac vessels and the mid sacral area there was narrowed ureter consistent with a stricture that would not allow the scope to pass.  This was brushed under direct vision and biopsied.  INTERPRETATION OF RETROGRADE PYELOGRAM:  Scout imaging revealed a wire in the right ureter.  After injection of contrast in a retrograde fashion there was an approximately 2 cm narrowed ureter with a mildly dilated ureter proximal to this, which showed a normal renal pelvis, upper, middle, and lower calices without filling defect.  DESCRIPTION OF PROCEDURE:  After consent was obtained, the patient was brought to the operating room.  A time-out was performed to  confirm the patient and procedure.  After adequate anesthesia, she was placed in lithotomy position after a time-out was performed.  The patient and procedure were confirmed.  The external genitalia and vagina were prepped and draped in the usual fashion.  Exam under anesthesia was normal as previously dictated.  The rigid cystoscope was passed per urethra.  The right stent was grasped and pulled through the meatus and a Sensor wire was advanced and coiled in the right renal pelvis and secured to the drape.  The cystoscope was replaced and the left stent was grasped and removed without difficulty.   A semirigid ureteroscope was then advanced into the patient's right distal ureter without difficulty.  The right distal ureter was normal in appearance.  It had passively dilated with the stent.  Between the distal and proximal ureter, the ureter narrowed and the scope would not advanced.  There were no significant findings of mass in this area, just a narrow ureter. A brush was advanced adjacent to the wire opened and swept back and forth several times and then collected.  The brush was passed second time back and forth due to stricture, closed and then collected.  This actually swept up some of the mucosa, therefore a cold cup biopsy was passed in.  Two biopsies were taken at the distal extent of the stricture.  I did try to advance the scope up into the stricture and it was too tight, it would not allow scope passage.  Therefore, the semi rigid ureteroscope was removed.  The wire was backloaded on the cystoscope and a 7 x 24 cm ureteral stent was advanced.  The wire was removed, a good coil was seen in the right upper pole and a good coil in the bladder.  The patient's bladder was drained.  She was then awakened, taken to recovery room in stable condition.  SPECIMENS: 1. Right ureteral brush biopsy. 2. Right ureteral cold cup biopsy. Specimens:  To pathology.  ESTIMATED BLOOD LOSS:  Zero.  COMPLICATIONS:  None.  DISPOSITION:  The patient is stable to PACU.          ______________________________ Jerilee Field, MD     ME/MEDQ  D:  04/17/2011  T:  04/17/2011  Job:  [id number]  Electronically Signed by Jerilee Field MD on 04/29/2011 07:18:43 PM

## 2011-06-03 ENCOUNTER — Telehealth: Payer: Self-pay | Admitting: *Deleted

## 2011-06-03 ENCOUNTER — Other Ambulatory Visit: Payer: Self-pay | Admitting: Internal Medicine

## 2011-06-03 MED ORDER — ZOLPIDEM TARTRATE 10 MG PO TABS: 10.0000 mg | ORAL_TABLET | Freq: Every evening | ORAL | Status: DC | PRN

## 2011-06-03 NOTE — Telephone Encounter (Signed)
k for refill x 5

## 2011-06-03 NOTE — Telephone Encounter (Signed)
Refill request for Zolpidem Tartrate 10 mg SIG take one tablet by mouth at bedtime PRN QTY 30, please advise refills

## 2011-06-24 ENCOUNTER — Other Ambulatory Visit: Payer: Self-pay | Admitting: Urology

## 2011-07-08 ENCOUNTER — Encounter (HOSPITAL_BASED_OUTPATIENT_CLINIC_OR_DEPARTMENT_OTHER): Payer: Self-pay | Admitting: *Deleted

## 2011-07-08 NOTE — Progress Notes (Addendum)
NPO AFTER MN. ARRIVES AT 0730. NEEDS HG. EKG W/ IN [facility]  11-13-2010. WILL TAKE NEXIUM AM OF SURG. W/ SIP OF WATER.

## 2011-07-08 NOTE — H&P (Addendum)
History of Present Illness           F/u right hydronephrosis. CT Aug 2012 showed moderate right hydro down to the distal ureter, with no definite stone. S/p bilateral URS Sept 2012 with brush biopsy and stent placement for right distal ureteral stricture and some subtle mucosal enhancement of left distal ureter. She also had a small stone in the bladder. I could not get access up to the right stricture, therefore blindly brushed and then stented the right. A small stone was found in the bladder which possibly passed from right ureter as no stone was seen in the bladder on her Aug 2012 CT.    Results Sept 21, 2012: Urine cytology - no malignant cells, few wbc's, few rbc's Bladder stone - Calcium phosphate, CaOxalate Left ureteral brush - rare atypical cells, chronic inflammation = favor reactive atypia  Left ureteral biopsy - insufficient material Right ureteral brush - limited material, reactive atypia favored, cant exclude LG neoplasm  She was taken back to OR Apr 17, 2011. The left stent was removed. Right URS revealed a narrow ureter with no obvious tumor. It was brushed and biopsied. Despite a 6Fr stent left in place prior I could not get scope past the stricture. I left an 8Fr stent this last time.   Results Apr 17, 2011: -right ureter brush - atypia favor reactive -right ureter biopsy - fibromuscular stroma with inflammation, necrosis, granulation (c/w ulcer) - no malignancy  Today, she is well. She's had a lot of bladder pain from the stent. No fever or chills. No dysuria.   KUB today - stent on right in good position. No stones seen. Normal bowel gas pattern.   Past Medical History Problems  1. History of  Arthritis V13.4 2. History of  Esophageal Reflux 530.81  Surgical History Problems  1. History of  Cesarean Section 2. History of  Cholecystectomy 3. History of  Cystoscopy With Insertion Of Ureteral Stent Bilateral 4. History of  Cystoscopy With Insertion Of Ureteral Stent  Right 5. History of  Cystoscopy With Ureteroscopy For Biopsy Bilateral 6. History of  Cystoscopy With Ureteroscopy For Biopsy Right 7. History of  Foot Surgery Right 8. History of  Hysterectomy V45.77  Current Meds 1. Ambien 10 MG Oral Tablet; Therapy: (Recorded:29Aug2012) to 2. CeleBREX 200 MG Oral Capsule; Therapy: (Recorded:29Aug2012) to 3. Ciprofloxacin HCl 250 MG Oral Tablet; take one po BID for three days then one po daily; Therapy:  04Sep2012 to (Evaluate:24Oct2012)  Requested for: 04Sep2012; Last Rx:04Sep2012 4. Dicyclomine HCl 10 MG Oral Capsule; Therapy: (Recorded:29Aug2012) to 5. Hydrocodone-Acetaminophen 5-325 MG Oral Tablet; TAKE 1 TO 2 TABLETS EVERY 4 TO 6  HOURS AS NEEDED FOR PAIN; Therapy: 04Dec2012 to (Evaluate:03Jan2013); Last  Rx:04Dec2012 6. Ketorolac Tromethamine 10 MG Oral Tablet; Therapy: (Recorded:29Aug2012) to 7. NexIUM 40 MG Oral Capsule Delayed Release; Therapy: (Recorded:29Aug2012) to 8. Oxybutynin Chloride 5 MG Oral Tablet; Therapy: (Recorded:29Aug2012) to 9. Oxycodone-Acetaminophen 5-325 MG Oral Tablet; TAKE 1 TO 2 TABLETS BY MOUTH EVERY 6  HOURS AS NEEDED FOR PAIN; Therapy: 21Sep2012 to 10. Senna CAPS; Therapy: (Recorded:29Aug2012) to 11. TraMADol HCl 50 MG Oral Tablet; TAKE 1 TO 2 TABLETS 4 TIMES DAILY AS NEEDED FOR PAIN;   Therapy: 08Oct2012 to (Evaluate:05Feb2013)  Requested for: 08Oct2012; Last Rx:08Oct2012 12. Urelle 81 MG Oral Tablet; TAKE 1 TABLET 3 TIMES DAILY AS NEEDED; Therapy: 01Oct2012 to   (Evaluate:12Nov2012)  Requested for: 01Oct2012; Last Rx:01Oct2012 13. Uribel 118 MG Oral Capsule; Therapy: 21Sep2012 to  Allergies Medication  1. Morphine Derivatives  Family History Problems  1. Maternal history of  Chronic Obstructive Pulmonary Disease 2. Maternal history of  Congestive Heart Failure 3. Fraternal history of  Congestive Heart Failure 4. Family history of  Death In The Family Father age 36 of prostate cancer 5. Family history of   Death In The Family Mother age 42 of COPD and Pneumonia 6. Family history of  Family Health Status Number Of Children 1 son  1 daughter 15. Paternal history of  Prostate Cancer V16.42  Social History Problems  1. Alcohol Use 1 mixed drink a month or so 2. Caffeine Use 2 cups coffee a day 3. Marital History - Currently Married 4. Never A Smoker 5. Occupation: nurse for [employer] Home Visits Denied  6. History of  Tobacco Use 305.1  Review of Systems Genitourinary, constitutional, skin, eye, otolaryngeal, hematologic/lymphatic, cardiovascular, pulmonary, endocrine, musculoskeletal, gastrointestinal, neurological and psychiatric system(s) were reviewed and pertinent findings if present are noted.    Vitals Vital Signs [Data Includes: Last 1 Day]  02Jan2013 08:30AM  Blood Pressure: 148 / 79 Temperature: 97.8 F Heart Rate: 75  Physical Exam Constitutional: Well nourished and well developed . No acute distress.  Pulmonary: No respiratory distress and normal respiratory rhythm and effort.  Cardiovascular: Heart rate and rhythm are normal . No peripheral edema.  Neuro/Psych:. Mood and affect are appropriate.    Results/Data Urine [Data Includes: Last 1 Day]   02Jan2013  COLOR GREEN   APPEARANCE CLEAR   SPECIFIC GRAVITY 1.025   pH 6.5   GLUCOSE NEG mg/dL  BILIRUBIN SMALL   KETONE NEG mg/dL  BLOOD LARGE   PROTEIN 100 mg/dL  UROBILINOGEN 0.2 mg/dL  NITRITE NEG   LEUKOCYTE ESTERASE TRACE   SQUAMOUS EPITHELIAL/HPF MODERATE   WBC 11-20 WBC/hpf  RBC TNTC RBC/hpf  BACTERIA MODERATE   CRYSTALS NONE SEEN   CASTS NONE SEEN    Assessment Assessed  1. Hydronephrosis 591 2. Pyuria 791.9 3. Ureteral Stricture Right 593.3  Plan  Hydronephrosis (591)  1. Follow-up Schedule Surgery Office  Follow-up  Requested for: 02Jan2013 Pyuria (791.9)  2. URINE CULTURE  Requested for: 02Jan2013  UA With REFLEX  Status: Resulted - Requires Verification  Done: 01Jan0001 12:00AM Ordered  Today; For: Health Maintenance (V70.0); Ordered By: Jerilee Field  Due: 04Jan2013 Marked Important; Last Updated By: Blinda Leatherwood   Discussion/Summary  Discussed with patient nature , risks , benefits and alternatives to right retrograde pyelogram, right ureteral balloon dilation including non-treatment/stent removal or right ureteral reimplant. We discussed again risks such as bleeding, infection, recurrence of obstruction/stricture and damage to ureter or adjacent structures. We also discussed the likelihood of achieving the goals of the procedure and potential problems that might occur during the procedure or recuperation. All questions answered. The patient elects to proceed with balloon dilation of right ureter.   Urine Cx with insignificant growth.  H&P Update: I have reviewed the history and examined the patient.  There are no changes to the history and physical. Date: 07/10/2011 Time: 0900 Doyne Keel, MD

## 2011-07-10 ENCOUNTER — Ambulatory Visit (HOSPITAL_BASED_OUTPATIENT_CLINIC_OR_DEPARTMENT_OTHER)
Admission: RE | Admit: 2011-07-10 | Discharge: 2011-07-10 | Disposition: A | Discharge status: Home / Self Care | Payer: No Typology Code available for payment source | Source: Ambulatory Visit | Attending: Urology | Admitting: Urology

## 2011-07-10 ENCOUNTER — Encounter (HOSPITAL_BASED_OUTPATIENT_CLINIC_OR_DEPARTMENT_OTHER)
Admission: RE | Disposition: A | Discharge status: Home / Self Care | Payer: Self-pay | Source: Ambulatory Visit | Attending: Urology

## 2011-07-10 ENCOUNTER — Encounter (HOSPITAL_BASED_OUTPATIENT_CLINIC_OR_DEPARTMENT_OTHER): Payer: Self-pay | Admitting: *Deleted

## 2011-07-10 ENCOUNTER — Encounter (HOSPITAL_BASED_OUTPATIENT_CLINIC_OR_DEPARTMENT_OTHER): Payer: Self-pay | Admitting: Anesthesiology

## 2011-07-10 ENCOUNTER — Ambulatory Visit (HOSPITAL_BASED_OUTPATIENT_CLINIC_OR_DEPARTMENT_OTHER): Payer: No Typology Code available for payment source | Admitting: Anesthesiology

## 2011-07-10 DIAGNOSIS — K219 Gastro-esophageal reflux disease without esophagitis: Secondary | ICD-10-CM | POA: Insufficient documentation

## 2011-07-10 DIAGNOSIS — N1330 Unspecified hydronephrosis: Secondary | ICD-10-CM | POA: Insufficient documentation

## 2011-07-10 DIAGNOSIS — Z79899 Other long term (current) drug therapy: Secondary | ICD-10-CM | POA: Insufficient documentation

## 2011-07-10 DIAGNOSIS — N135 Crossing vessel and stricture of ureter without hydronephrosis: Secondary | ICD-10-CM | POA: Insufficient documentation

## 2011-07-10 HISTORY — DX: Dorsalgia, unspecified: M54.9

## 2011-07-10 HISTORY — DX: Gastro-esophageal reflux disease without esophagitis: K21.9

## 2011-07-10 HISTORY — DX: Crossing vessel and stricture of ureter without hydronephrosis: N13.5

## 2011-07-10 HISTORY — PX: CYSTOSCOPY W/ RETROGRADES: SHX1426

## 2011-07-10 HISTORY — DX: Other intervertebral disc degeneration, lumbar region: M51.36

## 2011-07-10 HISTORY — DX: Fibromyalgia: M79.7

## 2011-07-10 HISTORY — DX: Calculus in bladder: N21.0

## 2011-07-10 HISTORY — PX: CYSTOSCOPY W/ URETERAL STENT REMOVAL: SHX1430

## 2011-07-10 HISTORY — PX: BALLOON DILATION: SHX5330

## 2011-07-10 HISTORY — DX: Barrett's esophagus without dysplasia: K22.70

## 2011-07-10 HISTORY — PX: CYSTOSCOPY W/ URETERAL STENT PLACEMENT: SHX1429

## 2011-07-10 HISTORY — DX: Irritable bowel syndrome, unspecified: K58.9

## 2011-07-10 HISTORY — DX: Other intervertebral disc degeneration, lumbar region without mention of lumbar back pain or lower extremity pain: M51.369

## 2011-07-10 HISTORY — DX: Other chronic pain: G89.29

## 2011-07-10 SURGERY — CYSTOSCOPY, WITH RETROGRADE PYELOGRAM
Anesthesia: General | Site: Ureter | Laterality: Right | Wound class: Clean Contaminated

## 2011-07-10 MED ORDER — PROMETHAZINE HCL 25 MG/ML IJ SOLN
6.2500 mg | INTRAMUSCULAR | Status: DC | PRN
  Administered 2011-07-10: 6.25 mg via INTRAVENOUS

## 2011-07-10 MED ORDER — MIDAZOLAM HCL 5 MG/5ML IJ SOLN
INTRAMUSCULAR | Status: DC | PRN
  Administered 2011-07-10: 1 mg via INTRAVENOUS

## 2011-07-10 MED ORDER — CIPROFLOXACIN HCL 500 MG PO TABS: 500.0000 mg | ORAL_TABLET | Freq: Two times a day (BID) | ORAL | Status: AC

## 2011-07-10 MED ORDER — FENTANYL CITRATE 0.05 MG/ML IJ SOLN
INTRAMUSCULAR | Status: DC | PRN
  Administered 2011-07-10 (×2): 25 ug via INTRAVENOUS
  Administered 2011-07-10: 50 ug via INTRAVENOUS

## 2011-07-10 MED ORDER — OXYCODONE-ACETAMINOPHEN 5-325 MG PO TABS: 1.0000 | ORAL_TABLET | Freq: Four times a day (QID) | ORAL | Status: AC | PRN

## 2011-07-10 MED ORDER — LACTATED RINGERS IV SOLN
INTRAVENOUS | Status: DC
  Administered 2011-07-10 (×3): via INTRAVENOUS

## 2011-07-10 MED ORDER — CEFAZOLIN SODIUM-DEXTROSE 2-3 GM-% IV SOLR
2.0000 g | INTRAVENOUS | Status: AC
  Administered 2011-07-10: 2 g via INTRAVENOUS

## 2011-07-10 MED ORDER — KETOROLAC TROMETHAMINE 30 MG/ML IJ SOLN
INTRAMUSCULAR | Status: DC | PRN
  Administered 2011-07-10: 30 mg via INTRAVENOUS

## 2011-07-10 MED ORDER — LIDOCAINE HCL (CARDIAC) 20 MG/ML IV SOLN
INTRAVENOUS | Status: DC | PRN
  Administered 2011-07-10: 60 mg via INTRAVENOUS

## 2011-07-10 MED ORDER — CEFAZOLIN SODIUM 1-5 GM-% IV SOLN: 1.0000 g | INTRAVENOUS | Status: DC

## 2011-07-10 MED ORDER — ONDANSETRON HCL 4 MG/2ML IJ SOLN
INTRAMUSCULAR | Status: DC | PRN
  Administered 2011-07-10: 4 mg via INTRAVENOUS

## 2011-07-10 MED ORDER — IOHEXOL 350 MG/ML SOLN
INTRAVENOUS | Status: DC | PRN
  Administered 2011-07-10: 50 mL

## 2011-07-10 MED ORDER — DEXAMETHASONE SODIUM PHOSPHATE 4 MG/ML IJ SOLN
INTRAMUSCULAR | Status: DC | PRN
  Administered 2011-07-10: 10 mg via INTRAVENOUS

## 2011-07-10 MED ORDER — STERILE WATER FOR IRRIGATION IR SOLN
Status: DC | PRN
  Administered 2011-07-10: 1

## 2011-07-10 MED ORDER — OXYCODONE-ACETAMINOPHEN 5-325 MG PO TABS
1.0000 | ORAL_TABLET | Freq: Four times a day (QID) | ORAL | Status: DC | PRN
  Administered 2011-07-10: 1 via ORAL

## 2011-07-10 MED ORDER — FENTANYL CITRATE 0.05 MG/ML IJ SOLN
25.0000 ug | INTRAMUSCULAR | Status: DC | PRN
  Administered 2011-07-10: 25 ug via INTRAVENOUS
  Administered 2011-07-10: 50 ug via INTRAVENOUS
  Administered 2011-07-10: 25 ug via INTRAVENOUS
  Administered 2011-07-10: 50 ug via INTRAVENOUS

## 2011-07-10 MED ORDER — PROPOFOL 10 MG/ML IV EMUL
INTRAVENOUS | Status: DC | PRN
  Administered 2011-07-10: 200 mg via INTRAVENOUS

## 2011-07-10 SURGICAL SUPPLY — 37 items
ADAPTER CATH URET PLST 4-6FR (CATHETERS) ×1 IMPLANT
ADPR CATH URET STRL DISP 4-6FR (CATHETERS) ×1
BAG DRAIN URO-CYSTO SKYTR STRL (DRAIN) ×2 IMPLANT
BAG DRN UROCATH (DRAIN) ×1
BASKET LASER NITINOL 1.9FR (BASKET) IMPLANT
BASKET SEGURA 3FR (UROLOGICAL SUPPLIES) IMPLANT
BASKET STNLS GEMINI 4WIRE 3FR (BASKET) IMPLANT
BASKET ZERO TIP NITINOL 2.4FR (BASKET) IMPLANT
BRUSH URET BIOPSY 3F (UROLOGICAL SUPPLIES) IMPLANT
BSKT STON RTRVL 120 1.9FR (BASKET)
BSKT STON RTRVL GEM 120X11 3FR (BASKET)
BSKT STON RTRVL ZERO TP 2.4FR (BASKET)
CANISTER SUCT LVC 12 LTR MEDI- (MISCELLANEOUS) ×1 IMPLANT
CATH INTERMIT  6FR 70CM (CATHETERS) IMPLANT
CATH URET 5FR 28IN CONE TIP (BALLOONS) ×1
CATH URET 5FR 28IN OPEN ENDED (CATHETERS) IMPLANT
CATH URET 5FR 70CM CONE TIP (BALLOONS) IMPLANT
CLOTH BEACON ORANGE TIMEOUT ST (SAFETY) ×2 IMPLANT
DRAPE CAMERA CLOSED 9X96 (DRAPES) ×2 IMPLANT
ELECT REM PT RETURN 9FT ADLT (ELECTROSURGICAL)
ELECTRODE REM PT RTRN 9FT ADLT (ELECTROSURGICAL) IMPLANT
GLOVE BIO SURGEON STRL SZ7.5 (GLOVE) ×2 IMPLANT
GLOVE BIOGEL PI IND STRL 6.5 (GLOVE) IMPLANT
GLOVE BIOGEL PI INDICATOR 6.5 (GLOVE) ×2
GOWN STRL REIN XL XLG (GOWN DISPOSABLE) ×2 IMPLANT
GOWN XL W/COTTON TOWEL STD (GOWNS) ×2 IMPLANT
GUIDEWIRE 0.038 PTFE COATED (WIRE) ×2 IMPLANT
GUIDEWIRE ANG ZIPWIRE 038X150 (WIRE) IMPLANT
GUIDEWIRE STR DUAL SENSOR (WIRE) ×1 IMPLANT
KIT BALLN UROMAX 15FX4 (MISCELLANEOUS) IMPLANT
KIT BALLN UROMAX 26 75X4 (MISCELLANEOUS) ×2
LASER FIBER DISP (UROLOGICAL SUPPLIES) IMPLANT
PACK CYSTOSCOPY (CUSTOM PROCEDURE TRAY) ×2 IMPLANT
SHEATH URET ACCESS 12FR/35CM (UROLOGICAL SUPPLIES) IMPLANT
SHEATH URET ACCESS 12FR/55CM (UROLOGICAL SUPPLIES) IMPLANT
STENT POLARIS LOOP 8FR X 24 CM (STENTS) ×1 IMPLANT
SYRINGE IRR TOOMEY STRL 70CC (SYRINGE) IMPLANT

## 2011-07-10 NOTE — Anesthesia Postprocedure Evaluation (Signed)
  Anesthesia Post-op Note  Patient: Miranda Mathis  Procedure(s) Performed:  CYSTOSCOPY WITH RETROGRADE PYELOGRAM; BALLOON DILATION; CYSTOSCOPY WITH STENT REMOVAL; CYSTOSCOPY WITH STENT REPLACEMENT  Patient Location: PACU  Anesthesia Type: General  Level of Consciousness: awake and alert   Airway and Oxygen Therapy: Patient Spontanous Breathing  Post-op Pain: mild  Post-op Assessment: Post-op Vital signs reviewed, Patient's Cardiovascular Status Stable, Respiratory Function Stable, Patent Airway and No signs of Nausea or vomiting  Post-op Vital Signs: stable  Complications: No apparent anesthesia complications. Nausea improved

## 2011-07-10 NOTE — Brief Op Note (Signed)
07/10/2011  10:09 AM  PATIENT:  Miranda Mathis  61 y.o. female  PRE-OPERATIVE DIAGNOSIS:  RT URETERAL STRICTURE RT HYDRONEPHROSIS  POST-OPERATIVE DIAGNOSIS:  RT URETERAL STRICTURE RT HYDRONEPHROSIS  PROCEDURE:  Procedure(s): CYSTOSCOPY WITH RETROGRADE PYELOGRAM BALLOON DILATION CYSTOSCOPY WITH STENT REMOVAL CYSTOSCOPY WITH STENT REPLACEMENT  SURGEON:  Surgeon(s): Antony Haste, MD  ANESTHESIA:   general  EBL:  Minimal   BLOOD ADMINISTERED:none  DRAINS:  8 x 24 cm ureteral stent  LOCAL MEDICATIONS USED:  NONE  SPECIMEN:  No Specimen  DISPOSITION OF SPECIMEN:  N/A  COUNTS:  YES  DICTATION: [id number]  PLAN OF CARE: Discharge to home after PACU  PATIENT DISPOSITION:  PACU - hemodynamically stable.   Delay start of Pharmacological VTE agent (>24hrs) due to surgical blood loss or risk of bleeding:  {YES/NO/NOT APPLICABLE:20182

## 2011-07-10 NOTE — Op Note (Signed)
NAMEREGENE, MCCARTHY                  ACCOUNT NO.:  [id number]  MEDICAL RECORD NO.:  [id number]  LOCATION:                               FACILITY:  [facility]  PHYSICIAN:  Jerilee Field, MD   DATE OF BIRTH:  1951-06-22  DATE OF PROCEDURE: DATE OF DISCHARGE:                              OPERATIVE REPORT   PREOPERATIVE DIAGNOSIS:  Right ureteral stricture.  POSTOPERATIVE DIAGNOSIS:  Right ureteral stricture.  PROCEDURE:  Cystoscopy, right retrograde pyelogram, balloon dilation of the ureteral stricture and ureteral stent removal and insertion.  SURGEON:  Jerilee Field, MD.  ANESTHESIA:  General.  INDICATION FOR PROCEDURE:  Ms Miranda Mathis is a 61 year old white female.  She presented with a right flank pain and right hydronephrosis down to a right mid to distal ureteral stricture.  Cytology and biopsies of the strictured area were negative for malignancy.  We discussed the nature, risks, benefits and alternatives to balloon dilation of the stricture including laser, incision or ureteral reimplant.  All questions were answered.  The patient elected to proceed with balloon dilation.  We did discuss the nature of ureteral strictures and the possibility of recurrence after balloon dilation requiring further stent or procedures such as ureteral reimplant.  All questions were answered.  She elected to proceed.  FINDINGS:  On right retrograde pyelogram the ureter appeared normal, then it went to a quite narrowed tight area that extended for approximately 1 cm at the iliac notch.  Contrast then spilled into a normal mid and proximal ureter, with a normal renal pelvis and complemented calices without any filling defects.  DESCRIPTION OF PROCEDURE:  After consent was obtained the patient was brought to the operating room.  A time-out was performed to confirm the patient and procedure.  After adequate anesthesia, SCDs and preop antibiotics she was placed in lithotomy position.  She was prepped  and draped in the usual fashion.  A rigid cystoscope was passed per urethra and the existing stent was grasped and removed without difficulty.  An 8- [place] cone tip catheter was then used to obtain a right retrograde pyelogram with retrograde injection of contrast as previously dictated. Sensor wire was then advanced and coiled in the upper pole.  A 4 cm, 5 mm/15 French balloon was then passed.  The area was then dilated in several passes.  On initial inflation the balloon was inflated to 8 atmospheres without difficulty, there was some wasting, and then up to 12 atmospheres, and burst.  The balloon was rated to 20 atmospheres.  A new balloon was then passed and the area was sequentially dilated covering below and above the strictured area, for a total of 9 minutes. The balloon was then removed.  The 8-French catheter was then advanced and another retrograde confirmed patency with retrograde injection. Although the area of dilation was irregular as expected there was no extravasation.  The wire was then backloaded on a cystoscope and an 8 x 24 cm ureteral stent was passed.  The wire was removed.  A good coil was seen in the right renal pelvis and the stent was noted to be properly positioned in the bladder.  It was a looped Polaris  stent.  The patient had been having quite a bit of bladder spasm from the stent.  She was then awakened and taken to recovery room in stable condition.  ESTIMATED BLOOD LOSS:  Minimal.  COMPLICATIONS:  None.  DRAINS:  8 x 24 cm right ureteral stent.  SPECIMENS:  None.  DISPOSITION:  Patient stable to PACU.          ______________________________ Jerilee Field, MD     ME/MEDQ  D:  07/10/2011  T:  07/10/2011  Job:  [id number]

## 2011-07-10 NOTE — Anesthesia Preprocedure Evaluation (Addendum)
Anesthesia Evaluation  Patient identified by MRN, date of birth, ID band Patient awake    Reviewed: Allergy & Precautions, H&P , NPO status , Patient's Chart, lab work & pertinent test results  Airway Mallampati: II TM Distance: >3 FB Neck ROM: Full    Dental No notable dental hx.  Very poor dentition. Denies loose teeth. Multiple discolored teeth.:   Pulmonary neg pulmonary ROS,  clear to auscultation  Pulmonary exam normal       Cardiovascular hypertension, Pt. on medications neg cardio ROS Regular Normal    Neuro/Psych  Headaches,  Neuromuscular disease Negative Neurological ROS  Negative Psych ROS   GI/Hepatic negative GI ROS, Neg liver ROS, GERD-  Medicated,  Endo/Other  Negative Endocrine ROS  Renal/GU negative Renal ROS  Genitourinary negative   Musculoskeletal negative musculoskeletal ROS (+) Fibromyalgia -  Abdominal   Peds negative pediatric ROS (+)  Hematology negative hematology ROS (+)   Anesthesia Other Findings   Reproductive/Obstetrics negative OB ROS                          Anesthesia Physical Anesthesia Plan  ASA: II  Anesthesia Plan: General   Post-op Pain Management:    Induction: Intravenous  Airway Management Planned: LMA  Additional Equipment:   Intra-op Plan:   Post-operative Plan: Extubation in OR  Informed Consent: I have reviewed the patients History and Physical, chart, labs and discussed the procedure including the risks, benefits and alternatives for the proposed anesthesia with the patient or authorized representative who has indicated his/her understanding and acceptance.   Dental advisory given  Plan Discussed with: CRNA  Anesthesia Plan Comments:         Anesthesia Quick Evaluation

## 2011-07-10 NOTE — Anesthesia Procedure Notes (Signed)
Procedure Name: LMA Insertion Date/Time: 07/10/2011 9:14 AM Performed by: Huel Coventry Pre-anesthesia Checklist: Patient identified, Emergency Drugs available, Suction available and Patient being monitored Patient Re-evaluated:Patient Re-evaluated prior to inductionOxygen Delivery Method: Circle System Utilized Preoxygenation: Pre-oxygenation with 100% oxygen Intubation Type: IV induction Ventilation: Mask ventilation without difficulty LMA: LMA inserted LMA Size: 4.0 Number of attempts: 1 Airway Equipment and Method: bite block Placement Confirmation: positive ETCO2 Tube secured with: Tape Dental Injury: Teeth and Oropharynx as per pre-operative assessment

## 2011-07-10 NOTE — Transfer of Care (Signed)
Immediate Anesthesia Transfer of Care Note  Patient: Miranda Mathis  Procedure(s) Performed:  CYSTOSCOPY WITH RETROGRADE PYELOGRAM; BALLOON DILATION; CYSTOSCOPY WITH STENT REMOVAL; CYSTOSCOPY WITH STENT REPLACEMENT  Patient Location: PACU  Anesthesia Type: General  Level of Consciousness: drowsy, arouses to name and follows commands  Airway & Oxygen Therapy: Patient Spontanous Breathing and Patient connected to face mask oxygen  Post-op Assessment: Report given to PACU RN and Post -op Vital signs reviewed and stable  Post vital signs: Reviewed and stable  Complications: No apparent anesthesia complications   Post vital signs:  Filed Vitals:   07/10/11 1016  BP:   Pulse:   Temp:   Resp: 15

## 2011-07-13 ENCOUNTER — Encounter (HOSPITAL_BASED_OUTPATIENT_CLINIC_OR_DEPARTMENT_OTHER): Payer: Self-pay | Admitting: Urology

## 2011-08-07 ENCOUNTER — Other Ambulatory Visit: Payer: Self-pay | Admitting: Internal Medicine

## 2011-09-14 ENCOUNTER — Other Ambulatory Visit (HOSPITAL_COMMUNITY): Payer: Self-pay | Admitting: Urology

## 2011-09-14 DIAGNOSIS — N1330 Unspecified hydronephrosis: Secondary | ICD-10-CM

## 2011-10-13 ENCOUNTER — Inpatient Hospital Stay (HOSPITAL_COMMUNITY)
Admission: RE | Admit: 2011-10-13 | Discharge: 2011-10-13 | Payer: No Typology Code available for payment source | Source: Ambulatory Visit | Attending: Urology | Admitting: Urology

## 2011-12-20 ENCOUNTER — Encounter (HOSPITAL_COMMUNITY): Payer: Self-pay | Admitting: Emergency Medicine

## 2011-12-20 ENCOUNTER — Emergency Department (HOSPITAL_COMMUNITY)
Admission: EM | Admit: 2011-12-20 | Discharge: 2011-12-20 | Disposition: A | Discharge status: Home / Self Care | Payer: Self-pay | Attending: Emergency Medicine | Admitting: Emergency Medicine

## 2011-12-20 DIAGNOSIS — N390 Urinary tract infection, site not specified: Secondary | ICD-10-CM | POA: Insufficient documentation

## 2011-12-20 DIAGNOSIS — N23 Unspecified renal colic: Secondary | ICD-10-CM | POA: Insufficient documentation

## 2011-12-20 DIAGNOSIS — R10819 Abdominal tenderness, unspecified site: Secondary | ICD-10-CM | POA: Insufficient documentation

## 2011-12-20 LAB — POCT I-STAT, CHEM 8
Calcium, Ion: 1.19 mmol/L (ref 1.12–1.32)
Glucose, Bld: 122 mg/dL — ABNORMAL HIGH (ref 70–99)
HCT: 39 % (ref 36.0–46.0)
Hemoglobin: 13.3 g/dL (ref 12.0–15.0)

## 2011-12-20 LAB — URINALYSIS, ROUTINE W REFLEX MICROSCOPIC
Ketones, ur: NEGATIVE mg/dL
Nitrite: NEGATIVE
Protein, ur: 100 mg/dL — AB
Specific Gravity, Urine: 1.024 (ref 1.005–1.030)
Urobilinogen, UA: 0.2 mg/dL (ref 0.0–1.0)

## 2011-12-20 MED ORDER — OXYCODONE-ACETAMINOPHEN 5-325 MG PO TABS: 1.0000 | ORAL_TABLET | Freq: Four times a day (QID) | ORAL | Status: AC | PRN

## 2011-12-20 MED ORDER — ONDANSETRON 8 MG PO TBDP: 8.0000 mg | ORAL_TABLET | Freq: Three times a day (TID) | ORAL | Status: AC | PRN

## 2011-12-20 MED ORDER — ONDANSETRON HCL 4 MG/2ML IJ SOLN
4.0000 mg | Freq: Once | INTRAMUSCULAR | Status: AC
  Administered 2011-12-20: 4 mg via INTRAVENOUS
  Filled 2011-12-20: qty 2

## 2011-12-20 MED ORDER — HYDROMORPHONE HCL PF 1 MG/ML IJ SOLN
1.0000 mg | Freq: Once | INTRAMUSCULAR | Status: AC
  Administered 2011-12-20: 1 mg via INTRAVENOUS
  Filled 2011-12-20: qty 1

## 2011-12-20 MED ORDER — DEXTROSE 5 % IV SOLN
1.0000 g | INTRAVENOUS | Status: DC
  Administered 2011-12-20: 1 g via INTRAVENOUS
  Filled 2011-12-20: qty 10

## 2011-12-20 MED ORDER — CIPROFLOXACIN HCL 500 MG PO TABS: 500.0000 mg | ORAL_TABLET | Freq: Two times a day (BID) | ORAL | Status: AC

## 2011-12-20 NOTE — Discharge Instructions (Signed)
Kidney Stones  Kidney stones (ureteral lithiasis) are deposits that form inside your kidneys. The intense pain is caused by the stone moving through the urinary tract. When the stone moves, the ureter goes into spasm around the stone. The stone is usually passed in the urine.   CAUSES    A disorder that makes certain neck glands produce too much parathyroid hormone (primary hyperparathyroidism).   A buildup of uric acid crystals.   Narrowing (stricture) of the ureter.   A kidney obstruction present at birth (congenital obstruction).   Previous surgery on the kidney or ureters.   Numerous kidney infections.  SYMPTOMS    Feeling sick to your stomach (nauseous).   Throwing up (vomiting).   Blood in the urine (hematuria).   Pain that usually spreads (radiates) to the groin.   Frequency or urgency of urination.  DIAGNOSIS    Taking a history and physical exam.   Blood or urine tests.   Computerized X-ray scan (CT scan).   Occasionally, an examination of the inside of the urinary bladder (cystoscopy) is performed.  TREATMENT    Observation.   Increasing your fluid intake.   Surgery may be needed if you have severe pain or persistent obstruction.  The size, location, and chemical composition are all important variables that will determine the proper choice of action for you. Talk to your caregiver to better understand your situation so that you will minimize the risk of injury to yourself and your kidney.   HOME CARE INSTRUCTIONS    Drink enough water and fluids to keep your urine clear or pale yellow.   Strain all urine through the provided strainer. Keep all particulate matter and stones for your caregiver to see. The stone causing the pain may be as small as a grain of salt. It is very important to use the strainer each and every time you pass your urine. The collection of your stone will allow your caregiver to analyze it and verify that a stone has actually passed.   Only take over-the-counter or  prescription medicines for pain, discomfort, or fever as directed by your caregiver.   Make a follow-up appointment with your caregiver as directed.   Get follow-up X-rays if required. The absence of pain does not always mean that the stone has passed. It may have only stopped moving. If the urine remains completely obstructed, it can cause loss of kidney function or even complete destruction of the kidney. It is your responsibility to make sure X-rays and follow-ups are completed. Ultrasounds of the kidney can show blockages and the status of the kidney. Ultrasounds are not associated with any radiation and can be performed easily in a matter of minutes.  SEEK IMMEDIATE MEDICAL CARE IF:    Pain cannot be controlled with the prescribed medicine.   You have a fever.   The severity or intensity of pain increases over 18 hours and is not relieved by pain medicine.   You develop a new onset of abdominal pain.   You feel faint or pass out.  MAKE SURE YOU:    Understand these instructions.   Will watch your condition.   Will get help right away if you are not doing well or get worse.  Document Released: 06/08/2005 Document Revised: 05/28/2011 Document Reviewed: 10/04/2009  ExitCare Patient Information 2012 [employer].    Urinary Tract Infection  Infections of the urinary tract can start in several places. A bladder infection (cystitis), a kidney infection (pyelonephritis), and a   prostate infection (prostatitis) are different types of urinary tract infections (UTIs). They usually get better if treated with medicines (antibiotics) that kill germs. Take all the medicine until it is gone. You or your child may feel better in a few days, but TAKE ALL MEDICINE or the infection may not respond and may become more difficult to treat.  HOME CARE INSTRUCTIONS    Drink enough water and fluids to keep the urine clear or pale yellow. Cranberry juice is especially recommended, in addition to large amounts of  water.   Avoid caffeine, tea, and carbonated beverages. They tend to irritate the bladder.   Alcohol may irritate the prostate.   Only take over-the-counter or prescription medicines for pain, discomfort, or fever as directed by your caregiver.  To prevent further infections:   Empty the bladder often. Avoid holding urine for long periods of time.   After a bowel movement, women should cleanse from front to back. Use each tissue only once.   Empty the bladder before and after sexual intercourse.  FINDING OUT THE RESULTS OF YOUR TEST  Not all test results are available during your visit. If your or your child's test results are not back during the visit, make an appointment with your caregiver to find out the results. Do not assume everything is normal if you have not heard from your caregiver or the medical facility. It is important for you to follow up on all test results.  SEEK MEDICAL CARE IF:    There is back pain.   Your baby is older than 3 months with a rectal temperature of 100.5 F (38.1 C) or higher for more than 1 day.   Your or your child's problems (symptoms) are no better in 3 days. Return sooner if you or your child is getting worse.  SEEK IMMEDIATE MEDICAL CARE IF:    There is severe back pain or lower abdominal pain.   You or your child develops chills.   You have a fever.   Your baby is older than 3 months with a rectal temperature of 102 F (38.9 C) or higher.   Your baby is 3 months old or younger with a rectal temperature of 100.4 F (38 C) or higher.   There is nausea or vomiting.   There is continued burning or discomfort with urination.  MAKE SURE YOU:    Understand these instructions.   Will watch your condition.   Will get help right away if you are not doing well or get worse.  Document Released: 03/18/2005 Document Revised: 05/28/2011 Document Reviewed: 10/21/2006  [employer] Patient Information 2012 [employer].

## 2011-12-20 NOTE — ED Provider Notes (Signed)
History     CSN: [id number]  Arrival date & time 12/20/11  1636   First MD Initiated Contact with Patient 12/20/11 1749       HPI Pt started having trouble with chills and fever yesterday.  [facility] and was noted to have blood in her urine.  No vomiting or diarrhea.  The pain is on the left side and flank.  Sharp in nature.  The symptoms feel similar to prior kidney stones.  The pain was severe last night.  Not as bad today.  No alleviating factors. The patient is concerned because she does not have insurance and wanted to try to avoid any testing if possible. Past Medical History  Diagnosis Date  . Headache   . Allergic rhinitis, cause unspecified   . Ureteral stricture, right   . Stone, bladder   . IBS (irritable bowel syndrome)   . Barrett's esophagus   . Fibromyalgia   . DDD (degenerative disc disease), lumbar   . Chronic back pain   . GERD (gastroesophageal reflux disease)   . Frequency of urination   . Nocturia     Past Surgical History  Procedure Date  . Cesarean section X2  . Cysto/ bilateral ureteroscopy / ureteral bx's/ bilateral ureteral stent placement/ bladder stone extraction 03-13-2011  . Right ureteroscopic / ureteral bx/ stent placement 04-17-2011  . Right foot surg 2003    HEEL  . Lumbar microdiscectomy 1990'S    L5 - S1  . Abdominal hysterectomy 1987  . Cystoscopy w/ retrogrades 07/10/2011    Procedure: CYSTOSCOPY WITH RETROGRADE PYELOGRAM;  Surgeon: Antony Haste, MD;  Location: [facility];  Service: Urology;  Laterality: Right;  . Balloon dilation 07/10/2011    Procedure: BALLOON DILATION;  Surgeon: Antony Haste, MD;  Location: [facility];  Service: Urology;  Laterality: Right;  . Cystoscopy w/ ureteral stent removal 07/10/2011    Procedure: CYSTOSCOPY WITH STENT REMOVAL;  Surgeon: Antony Haste, MD;  Location: [facility];  Service: Urology;  Laterality: Right;   . Cystoscopy w/ ureteral stent placement 07/10/2011    Procedure: CYSTOSCOPY WITH STENT REPLACEMENT;  Surgeon: Antony Haste, MD;  [facility];  Service: Urology;  Laterality: Right;    Family History  Problem Relation Age of Onset  . COPD Mother   . Emphysema Mother   . Osteoarthritis Mother   . Cancer Father     Prostate  . Coronary artery disease Paternal Grandfather     History  Substance Use Topics  . Smoking status: Never Smoker   . Smokeless tobacco: Never Used  . Alcohol Use: 0.5 oz/week    1 drink(s) per week     rarely    OB History    Grav Para Term Preterm Abortions TAB SAB Ect Mult Living                  Review of Systems  All other systems reviewed and are negative.    Allergies  Morphine  Home Medications   Current Outpatient Rx  Name Route Sig Dispense Refill  . ACETAMINOPHEN 500 MG PO TABS Oral Take 1,000 mg by mouth every 6 (six) hours as needed. pain    . DICYCLOMINE HCL 10 MG PO CAPS Oral Take 10 mg by mouth 3 (three) times daily.    [name] OMEPRAZOLE MAGNESIUM 20 MG PO TBEC Oral Take 20 mg by mouth daily.    [name]  ZOLPIDEM TARTRATE 10 MG PO TABS Oral Take 10 mg by mouth at bedtime as needed.      BP 158/79  Pulse 80  Temp 98.6 F (37 C) (Oral)  Resp 20  SpO2 100%  Physical Exam  Nursing note and vitals reviewed. Constitutional: She appears well-developed and well-nourished. No distress.  HENT:  Head: Normocephalic and atraumatic.  Right Ear: External ear normal.  Left Ear: External ear normal.  Eyes: Conjunctivae are normal. Right eye exhibits no discharge. Left eye exhibits no discharge. No scleral icterus.  Neck: Neck supple. No tracheal deviation present.  Cardiovascular: Normal rate, regular rhythm and intact distal pulses.   Pulmonary/Chest: Effort normal and breath sounds normal. No stridor. No respiratory distress. She has no wheezes. She has no rales.  Abdominal: Soft. Bowel sounds are normal.  She exhibits no distension. There is no tenderness. There is CVA tenderness (left-sided). There is no rebound and no guarding.  Musculoskeletal: She exhibits no edema and no tenderness.  Neurological: She is alert. She has normal strength. No sensory deficit. Cranial nerve deficit:  no gross defecits noted. She exhibits normal muscle tone. She displays no seizure activity. Coordination normal.  Skin: Skin is warm and dry. No rash noted.  Psychiatric: She has a normal mood and affect.    ED Course  Procedures (including critical care time)  Labs Reviewed  URINALYSIS, ROUTINE W REFLEX MICROSCOPIC - Abnormal; Notable for the following:    Color, Urine AMBER (*)  BIOCHEMICALS MAY BE AFFECTED BY COLOR   APPearance CLOUDY (*)     Hgb urine dipstick MODERATE (*)     Bilirubin Urine SMALL (*)     Protein, ur 100 (*)     Leukocytes, UA SMALL (*)     All other components within normal limits  POCT I-STAT, CHEM 8 - Abnormal; Notable for the following:    Glucose, Bld 122 (*)     All other components within normal limits  URINE MICROSCOPIC-ADD ON  URINE CULTURE   No results found.     MDM  Patient's symptoms and urinalysis suggest the possibility of a recurrent ureteral stone. Was noted to have some white blood cells in the urine that could be consistent with infection. Patient states she would prefer not to have any CAT scans done if possible. I do think it is reasonable to start to try treating the patient for her pain and start her on antibiotics for the possibility of urinary tract infection.  I did explain to the patient is a possibility that she could have a kidney stone or other illnesses such as diverticulitis. I  feel it is reasonable to treat her and empirically initially and instructed to return to emergency room to return if she does not get better we would perform CT scan at that point        Celene Kras, MD 12/20/11 2035

## 2011-12-20 NOTE — ED Notes (Signed)
RN to obtain labs with start of IV 

## 2011-12-20 NOTE — ED Notes (Signed)
Sent from [facility] to bve seen here-- had U/A done there, brought results. Diaphoretic on arrival,  Doubled over in pain,

## 2011-12-22 LAB — URINE CULTURE
Colony Count: NO GROWTH
Culture: NO GROWTH

## 2011-12-25 ENCOUNTER — Telehealth: Payer: Self-pay

## 2011-12-25 NOTE — Telephone Encounter (Signed)
Please advise if ok to refill zolpidem 10 mg 1qhs for this Norins pt. Patient last seen 01/23/11 and medication last filled 12/12 30/5rf. Thanks

## 2011-12-27 NOTE — Telephone Encounter (Signed)
OK to fill this prescription with additional refills x3 Needs OV with Dr Debby Bud  Thank you!

## 2011-12-28 ENCOUNTER — Other Ambulatory Visit: Payer: Self-pay | Admitting: General Practice

## 2011-12-28 MED ORDER — ZOLPIDEM TARTRATE 10 MG PO TABS: 10.0000 mg | ORAL_TABLET | Freq: Every evening | ORAL | Status: DC | PRN

## 2011-12-28 NOTE — Telephone Encounter (Signed)
Rf prev phoned in by someone else. Will have schedulers contact pt to sched OV.

## 2011-12-28 NOTE — Telephone Encounter (Signed)
Called pt, she stated she did not want to schedule an appt to see Dr.Norins.  She states her health insurance does not start until October and she wants to wait until she knows her work schedule to make appointment.  I emphasized to the patient the importance of calling us as soon as she is able to make the appt with her PCP.   Thanks!

## 2012-03-21 ENCOUNTER — Other Ambulatory Visit: Payer: Self-pay

## 2012-03-21 MED ORDER — ZOLPIDEM TARTRATE 10 MG PO TABS: 10.0000 mg | ORAL_TABLET | Freq: Every evening | ORAL | Status: DC | PRN

## 2012-03-21 NOTE — Telephone Encounter (Signed)
Please see previous note.

## 2012-03-21 NOTE — Telephone Encounter (Signed)
She will be out of her Ambien as of 03-25-12, she has appointment scheduled for 04/04/12 at 1000 with Dr Debby Bud.  Could her Ambien please be refilled?

## 2012-03-24 ENCOUNTER — Other Ambulatory Visit: Payer: Self-pay | Admitting: Internal Medicine

## 2012-03-25 NOTE — Telephone Encounter (Signed)
Ok to Rf? 

## 2012-04-04 ENCOUNTER — Encounter: Payer: Self-pay | Admitting: Internal Medicine

## 2012-04-04 ENCOUNTER — Ambulatory Visit (INDEPENDENT_AMBULATORY_CARE_PROVIDER_SITE_OTHER): Payer: Self-pay | Admitting: Internal Medicine

## 2012-04-04 VITALS — BP 122/88 | HR 81 | Temp 97.3°F | Resp 16 | Wt 208.0 lb

## 2012-04-04 DIAGNOSIS — E785 Hyperlipidemia, unspecified: Secondary | ICD-10-CM

## 2012-04-04 DIAGNOSIS — M549 Dorsalgia, unspecified: Secondary | ICD-10-CM

## 2012-04-04 DIAGNOSIS — E782 Mixed hyperlipidemia: Secondary | ICD-10-CM | POA: Insufficient documentation

## 2012-04-04 DIAGNOSIS — I10 Essential (primary) hypertension: Secondary | ICD-10-CM

## 2012-04-04 MED ORDER — DICYCLOMINE HCL 10 MG PO CAPS: 10.0000 mg | ORAL_CAPSULE | Freq: Three times a day (TID) | ORAL | Status: DC

## 2012-04-04 MED ORDER — MELOXICAM 15 MG PO TABS: 15.0000 mg | ORAL_TABLET | Freq: Every day | ORAL | Status: DC

## 2012-04-04 MED ORDER — OXYCODONE-ACETAMINOPHEN 5-325 MG PO TABS: 1.0000 | ORAL_TABLET | Freq: Three times a day (TID) | ORAL | Status: DC | PRN

## 2012-04-04 MED ORDER — ESOMEPRAZOLE MAGNESIUM 40 MG PO CPDR: 40.0000 mg | DELAYED_RELEASE_CAPSULE | Freq: Every day | ORAL | Status: DC

## 2012-04-04 NOTE — Progress Notes (Signed)
  Subjective:    Patient ID: Miranda Mathis, female    DOB: 15-May-1951, 61 y.o.   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 She has chronic back pain and uses intermittent doses of oxycodone/apap. At this time she is w/o insurance - plan is to provide refills, sign controlled substance contract and schedule back for full exam when her insurance kicks in with her new job.    Review of Systems     Objective:   Physical Exam        Assessment & Plan:

## 2012-04-04 NOTE — Patient Instructions (Addendum)
Come back when you can for lab - lipids and Bmet, and a general physical exam.

## 2012-04-04 NOTE — Assessment & Plan Note (Signed)
BP Readings from Last 3 Encounters:  04/04/12 122/88  12/20/11 123/55  07/10/11 138/78   OK

## 2012-04-04 NOTE — Assessment & Plan Note (Signed)
Still with occasional back pain that will require Percocet. Controlled substance contract executed, Rx for #30 Percocet 5/325 provided.

## 2012-06-27 ENCOUNTER — Other Ambulatory Visit: Payer: Self-pay | Admitting: Internal Medicine

## 2012-06-27 ENCOUNTER — Encounter: Payer: Self-pay | Admitting: Internal Medicine

## 2012-06-27 ENCOUNTER — Ambulatory Visit (INDEPENDENT_AMBULATORY_CARE_PROVIDER_SITE_OTHER): Payer: PRIVATE HEALTH INSURANCE | Admitting: Internal Medicine

## 2012-06-27 VITALS — BP 118/78 | HR 73 | Temp 97.9°F | Resp 12 | Wt 187.0 lb

## 2012-06-27 DIAGNOSIS — J069 Acute upper respiratory infection, unspecified: Secondary | ICD-10-CM

## 2012-06-27 DIAGNOSIS — Z23 Encounter for immunization: Secondary | ICD-10-CM

## 2012-06-27 MED ORDER — AMOXICILLIN-POT CLAVULANATE 875-125 MG PO TABS: 1.0000 | ORAL_TABLET | Freq: Two times a day (BID) | ORAL | Status: DC

## 2012-06-27 NOTE — Progress Notes (Signed)
Subjective:    Patient ID: Miranda Mathis, female    DOB: 07-18-1950, 62 y.o.   MRN: [id number]  HPI Ms. Bergsma presents with a 6 day h/o sinus pain and pressure, intermittent fever, has some rhinorrhea. She has a non-productive cough. She has SOB after paroxysm of coughing otherwise doing ok. No N/V/D.  Past Medical History  Diagnosis Date  . Headache   . Allergic rhinitis, cause unspecified   . Ureteral stricture, right   . Stone, bladder   . IBS (irritable bowel syndrome)   . Barrett's esophagus   . Fibromyalgia   . DDD (degenerative disc disease), lumbar   . Chronic back pain   . GERD (gastroesophageal reflux disease)   . Frequency of urination   . Nocturia    Past Surgical History  Procedure Date  . Cesarean section X2  . Cysto/ bilateral ureteroscopy / ureteral bx's/ bilateral ureteral stent placement/ bladder stone extraction 03-13-2011  . Right ureteroscopic / ureteral bx/ stent placement 04-17-2011  . Right foot surg 2003    HEEL  . Lumbar microdiscectomy 1990'S    L5 - S1  . Abdominal hysterectomy 1987  . Cystoscopy w/ retrogrades 07/10/2011    Procedure: CYSTOSCOPY WITH RETROGRADE PYELOGRAM;  Surgeon: Antony Haste, MD;  Location: [facility];  Service: Urology;  Laterality: Right;  . Balloon dilation 07/10/2011    Procedure: BALLOON DILATION;  Surgeon: Antony Haste, MD;  Location: [facility];  Service: Urology;  Laterality: Right;  . Cystoscopy w/ ureteral stent removal 07/10/2011    Procedure: CYSTOSCOPY WITH STENT REMOVAL;  Surgeon: Antony Haste, MD;  Location: [facility];  Service: Urology;  Laterality: Right;  . Cystoscopy w/ ureteral stent placement 07/10/2011    Procedure: CYSTOSCOPY WITH STENT REPLACEMENT;  Surgeon: Antony Haste, MD;  Location: [facility];  Service: Urology;  Laterality: Right;   Family History  Problem Relation Age of Onset  . COPD Mother     . Emphysema Mother   . Osteoarthritis Mother   . Cancer Father     Prostate  . Coronary artery disease Paternal Grandfather    History   Social History  . Marital Status: Married    Spouse Name: N/A    Number of Children: 2  . Years of Education: 13   Occupational History  . LPN    Social History Main Topics  . Smoking status: Never Smoker   . Smokeless tobacco: Never Used  . Alcohol Use: 0.5 oz/week    1 drink(s) per week     Comment: rarely  . Drug Use: No  . Sexually Active: Yes -- Female partner(s)   Other Topics Concern  . 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 1 daughter - '83 trisomy 13 defect; 1 son - '87, offers a road-side service; 1 grandson; 1 step -son.  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    Current Outpatient Prescriptions on File Prior to Visit  Medication Sig Dispense Refill  . acetaminophen (TYLENOL) 500 MG tablet Take 1,000 mg by mouth every 6 (six) hours as needed. pain      . dicyclomine (BENTYL) 10 MG capsule Take 1 capsule (10 mg total) by mouth 3 (three) times daily.  90 capsule  6  . meloxicam (MOBIC) 15 MG tablet Take 1 tablet (15 mg total) by mouth daily.  30 tablet  5  . omeprazole (PRILOSEC OTC) 20 MG tablet Take 20 mg by mouth daily.      [name] oxyCODONE-acetaminophen (ROXICET) 5-325 MG per tablet Take 1 tablet by mouth every 8 (eight) hours as needed for pain.  30 tablet  0  . zolpidem (AMBIEN) 10 MG tablet TAKE 1 TABLET BY MOUTH AT BEDTIME AS NEEDED  30 tablet  2      Review of Systems System review is negative for any constitutional, cardiac, pulmonary, GI or neuro symptoms or complaints other than as described in the HPI.     Objective:   Physical Exam Filed Vitals:   06/27/12 1602  BP: 118/78  Pulse: 73  Temp: 97.9 F (36.6 C)  Resp: 12   HEENT- tender to percussion over the frontal and maxillary sinus Neck supple Nodes - negative Cor- RRR Pulm -  CTAP       Assessment & Plan:  URI - Multiple symptoms for almost a week.  Plan Augmentin 875 twice a day for 7 days    Continue Delsym for cough  APAP for fever, chills and discomfort.

## 2012-06-27 NOTE — Patient Instructions (Addendum)
URI - take augmentin for 7 days, fluids, APAP. Call if not better soon.

## 2012-09-08 ENCOUNTER — Telehealth: Payer: Self-pay | Admitting: Internal Medicine

## 2012-09-08 MED ORDER — AMOXICILLIN-POT CLAVULANATE 875-125 MG PO TABS: 1.0000 | ORAL_TABLET | Freq: Two times a day (BID) | ORAL | Status: DC

## 2012-09-08 NOTE — Telephone Encounter (Signed)
OK for augmentin 875 bid x 7 days #14. Rx sent to pharmacy

## 2012-09-08 NOTE — Telephone Encounter (Signed)
Patient Information:  Caller Name: Korrin  Phone: [phone]  Patient: Miranda Mathis, Miranda Mathis  Gender: Female  DOB: 11/13/1950  Age: 62 Years  PCP: Illene Regulus (Adults only)  Office Follow Up:  Does the office need to follow up with this patient?: Yes  Instructions For The Office: Patient declines appointment and is requesting a refill of Augmentin.  Drug Store of choice is [facility] in [place].  Contact her on cell:  [phone]  RN Note:  Patient has tried Afrin, Mucinex and Tylenol and while it helps it has resolved symptoms.  Denies fever or emergent symptoms.  Declined appointment requesting Dr. Debby Bud refill the Augmentin she took in January 2014.  Patient states that this is her annual sinus infection.  Is blowing out brown nasal discharge. States with her work it is difficult to come in for an appointment.  Symptoms  Reason For Call & Symptoms: Has a sinus infection-head pressure, pain, congestion, brownish mucus blown from nose.  Reviewed Health History In EMR: Yes  Reviewed Medications In EMR: Yes  Reviewed Allergies In EMR: Yes  Reviewed Surgeries / Procedures: Yes  Date of Onset of Symptoms: 09/03/2012  Treatments Tried: Tylenol  Treatments Tried Worked: No  Guideline(s) Used:  Colds  Disposition Per Guideline:   See Today or Tomorrow in Office  Reason For Disposition Reached:   Using nasal washes and pain medicine > 24 hours and sinus pain (lower forehead, cheekbone, or eye) persists  Advice Given:  For a Runny Nose With Profuse Discharge:   Nasal mucus and discharge helps to wash viruses and bacteria out of the nose and sinuses.  Humidifier:  If the air in your home is dry, use a cool-mist humidifier  Call Back If:  Difficulty breathing occurs  Fever lasts more than 3 days  Nasal discharge lasts more than 10 days  Cough lasts more than 3 weeks  You become worse  For a Stuffy Nose - Use Nasal Washes:  Introduction: Saline (salt water) nasal irrigation (nasal wash)  is an effective and simple home remedy for treating stuffy nose and sinus congestion. The nose can be irrigated by pouring, spraying, or squirting salt water into the nose and then letting it run back out.  Patient Refused Recommendation:  Patient Requests Prescription  Is requesting a refill of Augmentin.

## 2012-09-08 NOTE — Telephone Encounter (Signed)
Pt advised of Rx/pharmacy 

## 2012-09-26 ENCOUNTER — Other Ambulatory Visit: Payer: Self-pay | Admitting: Internal Medicine

## 2012-09-28 NOTE — Telephone Encounter (Signed)
Zolpidem called to pharmacy  

## 2012-12-26 ENCOUNTER — Other Ambulatory Visit: Payer: Self-pay | Admitting: Internal Medicine

## 2012-12-27 ENCOUNTER — Telehealth: Payer: Self-pay | Admitting: Internal Medicine

## 2012-12-27 MED ORDER — PREDNISONE 10 MG PO TABS: 10.0000 mg | ORAL_TABLET | Freq: Every day | ORAL | Status: DC

## 2012-12-27 NOTE — Telephone Encounter (Signed)
Zolpidem called to pharmacy  

## 2012-12-27 NOTE — Telephone Encounter (Signed)
Pt has lower back pain from a pulled muscle and elbow pain.  She wants to know if prednisone can be called in or if she can be worked in this week.

## 2012-12-27 NOTE — Telephone Encounter (Signed)
Prednisone burst and taper sent to pharmacy. Stop meloxicam while taking prednisone. If her symptoms don't improve will need ov.

## 2012-12-28 NOTE — Telephone Encounter (Signed)
Pt is aware.  

## 2013-01-03 ENCOUNTER — Ambulatory Visit: Payer: PRIVATE HEALTH INSURANCE | Admitting: Internal Medicine

## 2013-01-04 ENCOUNTER — Ambulatory Visit (INDEPENDENT_AMBULATORY_CARE_PROVIDER_SITE_OTHER): Payer: BC Managed Care – PPO | Admitting: Internal Medicine

## 2013-01-04 ENCOUNTER — Encounter: Payer: Self-pay | Admitting: Internal Medicine

## 2013-01-04 VITALS — BP 114/80 | HR 76 | Temp 98.1°F | Wt 200.8 lb

## 2013-01-04 DIAGNOSIS — M7711 Lateral epicondylitis, right elbow: Secondary | ICD-10-CM

## 2013-01-04 DIAGNOSIS — M25561 Pain in right knee: Secondary | ICD-10-CM

## 2013-01-04 DIAGNOSIS — M7710 Lateral epicondylitis, unspecified elbow: Secondary | ICD-10-CM

## 2013-01-04 DIAGNOSIS — M25562 Pain in left knee: Secondary | ICD-10-CM

## 2013-01-04 DIAGNOSIS — M25569 Pain in unspecified knee: Secondary | ICD-10-CM

## 2013-01-04 NOTE — Progress Notes (Signed)
Subjective:    Patient ID: Miranda Mathis, female    DOB: [birth year], 62 y.o.   MRN: [id number]  HPI Miranda Mathis presents with several MSK issues. For 1 month she has had pain at the lateral epicondyle. It hurts to lift and a pressure band at the proximal forearm helps.  She will get a sharp pain at the medial aspect of both knees R>L. This is very positional.  Past Medical History  Diagnosis Date  . Headache(784.0)   . Allergic rhinitis, cause unspecified   . Ureteral stricture, right   . Stone, bladder   . IBS (irritable bowel syndrome)   . Barrett's esophagus   . Fibromyalgia   . DDD (degenerative disc disease), lumbar   . Chronic back pain   . GERD (gastroesophageal reflux disease)   . Frequency of urination   . Nocturia    Past Surgical History  Procedure Laterality Date  . Cesarean section  X2  . Cysto/ bilateral ureteroscopy / ureteral bx's/ bilateral ureteral stent placement/ bladder stone extraction  03-13-2011  . Right ureteroscopic / ureteral bx/ stent placement  04-17-2011  . Right foot surg  2003    HEEL  . Lumbar microdiscectomy  1990'S    L5 - S1  . Abdominal hysterectomy  1987  . Cystoscopy w/ retrogrades  07/10/2011    Procedure: CYSTOSCOPY WITH RETROGRADE PYELOGRAM;  Surgeon: Antony Haste, MD;  Location: [facility];  Service: Urology;  Laterality: Right;  . Balloon dilation  07/10/2011    Procedure: BALLOON DILATION;  Surgeon: Antony Haste, MD;  Location: [facility];  Service: Urology;  Laterality: Right;  . Cystoscopy w/ ureteral stent removal  07/10/2011    Procedure: CYSTOSCOPY WITH STENT REMOVAL;  Surgeon: Antony Haste, MD;  Location: [facility];  Service: Urology;  Laterality: Right;  . Cystoscopy w/ ureteral stent placement  07/10/2011    Procedure: CYSTOSCOPY WITH STENT REPLACEMENT;  Surgeon: Antony Haste, MD;  Location: [facility];  Service: Urology;   Laterality: Right;   Family History  Problem Relation Age of Onset  . COPD Mother   . Emphysema Mother   . Osteoarthritis Mother   . Cancer Father     Prostate  . Coronary artery disease Paternal Grandfather    History   Social History  . Marital Status: Married    Spouse Name: N/A    Number of Children: 2  . Years of Education: 13   Occupational History  . LPN    Social History Main Topics  . Smoking status: Never Smoker   . Smokeless tobacco: Never Used  . Alcohol Use: 0.5 oz/week    1 drink(s) per week     Comment: rarely  . Drug Use: No  . Sexually Active: Yes -- Female partner(s)   Other Topics Concern  . 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 1 daughter - '83 trisomy 13 defect; 1 son - '87, offers a road-side service; 1 grandson; 1 step -son.  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    Current Outpatient Prescriptions on File Prior to Visit  Medication Sig Dispense Refill  . acetaminophen (TYLENOL) 500 MG tablet Take 1,000 mg by mouth every 6 (six) hours as needed. pain      . amoxicillin-clavulanate (AUGMENTIN) 875-125 MG per tablet Take 1 tablet by mouth 2 (two) times daily.  14 tablet  0  . dicyclomine (BENTYL) 10 MG capsule Take 1 capsule (10 mg total) by mouth 3 (three) times daily.  90 capsule  6  . meloxicam (MOBIC) 15 MG tablet Take 1 tablet (15 mg total) by mouth daily.  30 tablet  5  . omeprazole (PRILOSEC OTC) 20 MG tablet Take 20 mg by mouth daily.      [name] oxyCODONE-acetaminophen (ROXICET) 5-325 MG per tablet Take 1 tablet by mouth every 8 (eight) hours as needed for pain.  30 tablet  0  . predniSONE (DELTASONE) 10 MG tablet Take 1 tablet (10 mg total) by mouth daily. 3 tabs daily for 3 days; 2 tabs daily for 3 days; 1 tab daily for 6 days  21 tablet  0  . zolpidem (AMBIEN) 10 MG tablet TAKE 1 TABLET BY MOUTH AT BEDTIME AS NEEDED  30 tablet  2   No current facility-administered  medications on file prior to visit.      Review of Systems System review is negative for any constitutional, cardiac, pulmonary, GI or neuro symptoms or complaints other than as described in the HPI.     Objective:   Physical Exam Filed Vitals:   01/04/13 1656  BP: 114/80  Pulse: 76  Temp: 98.1 F (36.7 C)   Gen'l- WNWD overweight white woman MSK - very tender at right lateral epicondyle, exacerbated by supination against resistance  Very tender at the tendinous insertion of the quads right worse than left.       Assessment & Plan:  1. Epicondylitis, lateral right - positive exam  Plan Offered steroid injection - declined  Will use rub of choice, otc NSAIDs, forearm pressure band  2. Knee pain - not in the joint. Most likely tendonitis  Plan Rub of choice  NSAIDs - continue mobic  May try topical NSAIDs if pain persists.  Stretching

## 2013-01-04 NOTE — Patient Instructions (Addendum)
1. Golfer's elbow - lateral epicondylitis Plan aspercreme to the sore place  Forearm brace/band  For lack of improvement - steroid injection  2. Knee pain - there does not seem to be any joint damage. The pain is at the tendonous attachment of the quadriceps muscle of both legs. From strain, overuse or bad luck. Plan aspercreme  Can consider local steroid injections or  Topical NSAID, e.g. Volaren gel or Pennsaid lotion (a bit pricey)   Lateral Epicondylitis (Tennis Elbow) with Rehab Lateral epicondylitis involves inflammation and pain around the outer portion of the elbow. The pain is caused by inflammation of the tendons in the forearm that bring back (extend) the wrist. Lateral epicondylittis is also called tennis elbow, because it is very common in tennis players. However, it may occur in any individual who extends the wrist repetitively. If lateral epicondylitis is left untreated, it may become a chronic problem. SYMPTOMS   Pain, tenderness, and inflammation on the outer (lateral) side of the elbow.  Pain or weakness with gripping activities.  Pain that increases with wrist twisting motions (playing tennis, using a screwdriver, opening a door or a jar).  Pain with lifting objects, including a coffee cup. CAUSES  Lateral epicondylitis is caused by inflammation of the tendons that extend the wrist. Causes of injury may include:  Repetitive stress and strain on the muscles and tendons that extend the wrist.  Sudden change in activity level or intensity.  Incorrect grip in racquet sports.  Incorrect grip size of racquet (often too large).  Incorrect hitting position or technique (usually backhand, leading with the elbow).  Using a racket that is too heavy. RISK INCREASES WITH:  Sports or occupations that require repetitive and/or strenuous forearm and wrist movements (tennis, squash, racquetball, carpentry).  Poor wrist and forearm strength and flexibility.  Failure to  warm up properly before activity.  Resuming activity before healing, rehabilitation, and conditioning are complete. PREVENTION   Warm up and stretch properly before activity.  Maintain physical fitness:  Strength, flexibility, and endurance.  Cardiovascular fitness.  Wear and use properly fitted equipment.  Learn and use proper technique and have a coach correct improper technique.  Wear a tennis elbow (counterforce) brace. PROGNOSIS  The course of this condition depends on the degree of the injury. If treated properly, acute cases (symptoms lasting less than 4 weeks) are often resolved in 2 to 6 weeks. Chronic (longer lasting cases) often resolve in 3 to 6 months, but may require physical therapy. RELATED COMPLICATIONS   Frequently recurring symptoms, resulting in a chronic problem. Properly treating the problem the first time decreases frequency of recurrence.  Chronic inflammation, scarring tendon degeneration, and partial tendon tear, requiring surgery.  Delayed healing or resolution of symptoms. TREATMENT  Treatment first involves the use of ice and medicine, to reduce pain and inflammation. Strengthening and stretching exercises may help reduce discomfort, if performed regularly. These exercises may be performed at home, if the condition is an acute injury. Chronic cases may require a referral to a physical therapist for evaluation and treatment. Your caregiver may advise a corticosteroid injection, to help reduce inflammation. Rarely, surgery is needed. MEDICATION  If pain medicine is needed, nonsteroidal anti-inflammatory medicines (aspirin and ibuprofen), or other minor pain relievers (acetaminophen), are often advised.  Do not take pain medicine for 7 days before surgery.  Prescription pain relievers may be given, if your caregiver thinks they are needed. Use only as directed and only as much as you need.  Corticosteroid  injections may be recommended. These injections  should be reserved only for the most severe cases, because they can only be given a certain number of times. HEAT AND COLD  Cold treatment (icing) should be applied for 10 to 15 minutes every 2 to 3 hours for inflammation and pain, and immediately after activity that aggravates your symptoms. Use ice packs or an ice massage.  Heat treatment may be used before performing stretching and strengthening activities prescribed by your caregiver, physical therapist, or athletic trainer. Use a heat pack or a warm water soak. SEEK MEDICAL CARE IF: Symptoms get worse or do not improve in 2 weeks, despite treatment. EXERCISES  RANGE OF MOTION (ROM) AND STRETCHING EXERCISES - Epicondylitis, Lateral (Tennis Elbow) These exercises may help you when beginning to rehabilitate your injury. Your symptoms may go away with or without further involvement from your physician, physical therapist or athletic trainer. While completing these exercises, remember:   Restoring tissue flexibility helps normal motion to return to the joints. This allows healthier, less painful movement and activity.  An effective stretch should be held for at least 30 seconds.  A stretch should never be painful. You should only feel a gentle lengthening or release in the stretched tissue. RANGE OF MOTION  Wrist Flexion, Active-Assisted  Extend your right / left elbow with your fingers pointing down.*  Gently pull the back of your hand towards you, until you feel a gentle stretch on the top of your forearm.  Morath this position for __________ seconds. Repeat __________ times. Complete this exercise __________ times per day.  *If directed by your physician, physical therapist or athletic trainer, complete this stretch with your elbow bent, rather than extended. RANGE OF MOTION  Wrist Extension, Active-Assisted  Extend your right / left elbow and turn your palm upwards.*  Gently pull your palm and fingertips back, so your wrist extends  and your fingers point more toward the ground.  You should feel a gentle stretch on the inside of your forearm.  Lansberry this position for __________ seconds. Repeat __________ times. Complete this exercise __________ times per day. *If directed by your physician, physical therapist or athletic trainer, complete this stretch with your elbow bent, rather than extended. STRETCH - Wrist Flexion  Place the back of your right / left hand on a tabletop, leaving your elbow slightly bent. Your fingers should point away from your body.  Gently press the back of your hand down onto the table by straightening your elbow. You should feel a stretch on the top of your forearm.  Finkbiner this position for __________ seconds. Repeat __________ times. Complete this stretch __________ times per day.  STRETCH  Wrist Extension   Place your right / left fingertips on a tabletop, leaving your elbow slightly bent. Your fingers should point backwards.  Gently press your fingers and palm down onto the table by straightening your elbow. You should feel a stretch on the inside of your forearm.  Lovins this position for __________ seconds. Repeat __________ times. Complete this stretch __________ times per day.  STRENGTHENING EXERCISES - Epicondylitis, Lateral (Tennis Elbow) These exercises may help you when beginning to rehabilitate your injury. They may resolve your symptoms with or without further involvement from your physician, physical therapist or athletic trainer. While completing these exercises, remember:   Muscles can gain both the endurance and the strength needed for everyday activities through controlled exercises.  Complete these exercises as instructed by your physician, physical therapist or athletic trainer. Increase  the resistance and repetitions only as guided.  You may experience muscle soreness or fatigue, but the pain or discomfort you are trying to eliminate should never worsen during these  exercises. If this pain does get worse, stop and make sure you are following the directions exactly. If the pain is still present after adjustments, discontinue the exercise until you can discuss the trouble with your caregiver. STRENGTH Wrist Flexors  Sit with your right / left forearm palm-up and fully supported on a table or countertop. Your elbow should be resting below the height of your shoulder. Allow your wrist to extend over the edge of the surface.  Loosely holding a __________ weight, or a piece of rubber exercise band or tubing, slowly curl your hand up toward your forearm.  Gasser this position for __________ seconds. Slowly lower the wrist back to the starting position in a controlled manner. Repeat __________ times. Complete this exercise __________ times per day.  STRENGTH  Wrist Extensors  Sit with your right / left forearm palm-down and fully supported on a table or countertop. Your elbow should be resting below the height of your shoulder. Allow your wrist to extend over the edge of the surface.  Loosely holding a __________ weight, or a piece of rubber exercise band or tubing, slowly curl your hand up toward your forearm.  Colomb this position for __________ seconds. Slowly lower the wrist back to the starting position in a controlled manner. Repeat __________ times. Complete this exercise __________ times per day.  STRENGTH - Ulnar Deviators  Stand with a ____________________ weight in your right / left hand, or sit while holding a rubber exercise band or tubing, with your healthy arm supported on a table or countertop.  Move your wrist, so that your pinkie travels toward your forearm and your thumb moves away from your forearm.  Nolte this position for __________ seconds and then slowly lower the wrist back to the starting position. Repeat __________ times. Complete this exercise __________ times per day STRENGTH - Radial Deviators  Stand with a ____________________ weight  in your right / left hand, or sit while holding a rubber exercise band or tubing, with your injured arm supported on a table or countertop.  Raise your hand upward in front of you or pull up on the rubber tubing.  Wyne this position for __________ seconds and then slowly lower the wrist back to the starting position. Repeat __________ times. Complete this exercise __________ times per day. STRENGTH  Forearm Supinators   Sit with your right / left forearm supported on a table, keeping your elbow below shoulder height. Rest your hand over the edge, palm down.  Gently grip a hammer or a soup ladle.  Without moving your elbow, slowly turn your palm and hand upward to a "thumbs-up" position.  Strack this position for __________ seconds. Slowly return to the starting position. Repeat __________ times. Complete this exercise __________ times per day.  STRENGTH  Forearm Pronators   Sit with your right / left forearm supported on a table, keeping your elbow below shoulder height. Rest your hand over the edge, palm up.  Gently grip a hammer or a soup ladle.  Without moving your elbow, slowly turn your palm and hand upward to a "thumbs-up" position.  Deeg this position for __________ seconds. Slowly return to the starting position. Repeat __________ times. Complete this exercise __________ times per day.  STRENGTH - Grip  Grasp a tennis ball, a dense sponge, or a large,  rolled sock in your hand.  Squeeze as hard as you can, without increasing any pain.  Jorden this position for __________ seconds. Release your grip slowly. Repeat __________ times. Complete this exercise __________ times per day.  STRENGTH - Elbow Extensors, Isometric  Stand or sit upright, on a firm surface. Place your right / left arm so that your palm faces your stomach, and it is at the height of your waist.  Place your opposite hand on the underside of your forearm. Gently push up as your right / left arm resists. Push as  hard as you can with both arms, without causing any pain or movement at your right / left elbow. Campanelli this stationary position for __________ seconds. Gradually release the tension in both arms. Allow your muscles to relax completely before repeating. Document Released: 06/08/2005 Document Revised: 08/31/2011 Document Reviewed: 09/20/2008 [facility] Patient Information 2014 [place], [place].  Tendinitis Tendinitis is swelling and inflammation of the tendons. Tendons are band-like tissues that connect muscle to bone. Tendinitis commonly occurs in the:   Shoulders (rotator cuff).  Heels (Achilles tendon).  Elbows (triceps tendon). CAUSES Tendinitis is usually caused by overusing the tendon, muscles, and joints involved. When the tissue surrounding a tendon (synovium) becomes inflamed, it is called tenosynovitis. Tendinitis commonly develops in people whose jobs require repetitive motions. SYMPTOMS  Pain.  Tenderness.  Mild swelling. DIAGNOSIS Tendinitis is usually diagnosed by physical exam. Your caregiver may also order X-rays or other imaging tests. TREATMENT Your caregiver may recommend certain medicines or exercises for your treatment. HOME CARE INSTRUCTIONS   Use a sling or splint for as long as directed by your caregiver until the pain decreases.  Put ice on the injured area.  Put ice in a plastic bag.  Place a towel between your skin and the bag.  Leave the ice on for 15-20 minutes, 3-4 times a day.  Avoid using the limb while the tendon is painful. Perform gentle range of motion exercises only as directed by your caregiver. Stop exercises if pain or discomfort increase, unless directed otherwise by your caregiver.  Only take over-the-counter or prescription medicines for pain, discomfort, or fever as directed by your caregiver. SEEK MEDICAL CARE IF:   Your pain and swelling increase.  You develop new, unexplained symptoms, especially increased numbness in the  hands. MAKE SURE YOU:   Understand these instructions.  Will watch your condition.  Will get help right away if you are not doing well or get worse. Document Released: 06/05/2000 Document Revised: 08/31/2011 Document Reviewed: 08/25/2010 [facility] Patient Information 2014 [place], [place].

## 2013-03-02 ENCOUNTER — Encounter: Payer: Self-pay | Admitting: Internal Medicine

## 2013-03-02 ENCOUNTER — Ambulatory Visit (INDEPENDENT_AMBULATORY_CARE_PROVIDER_SITE_OTHER): Payer: BC Managed Care – PPO | Admitting: Internal Medicine

## 2013-03-02 VITALS — BP 100/80 | HR 80 | Temp 98.1°F | Wt 200.4 lb

## 2013-03-02 DIAGNOSIS — R109 Unspecified abdominal pain: Secondary | ICD-10-CM

## 2013-03-02 DIAGNOSIS — R52 Pain, unspecified: Secondary | ICD-10-CM

## 2013-03-02 DIAGNOSIS — N390 Urinary tract infection, site not specified: Secondary | ICD-10-CM

## 2013-03-02 MED ORDER — SULFAMETHOXAZOLE-TMP DS 800-160 MG PO TABS: 1.0000 | ORAL_TABLET | Freq: Two times a day (BID) | ORAL | Status: DC

## 2013-03-02 MED ORDER — KETOROLAC TROMETHAMINE 10 MG PO TABS: 10.0000 mg | ORAL_TABLET | Freq: Three times a day (TID) | ORAL | Status: DC

## 2013-03-02 NOTE — Patient Instructions (Addendum)
Flank pain - dip U/A positive for blood but negative for leukocytes. Suprapubic tenderness but also flank pain. She does have a h/o kidney stone  Plan Septra DS bid x 5 for possible simple cystitis  Strain urine  Toradol 10 mg tid with five day supply for persistent pain.  For worsening pain, frank blood or renal colic - return for further evaluation.

## 2013-03-02 NOTE — Progress Notes (Signed)
  Subjective:    Patient ID: Miranda Mathis, female    DOB: 01-17-1951, 62 y.o.   MRN: [id number]  HPI Miranda Mathis presents for a 12 hr h/o supra-pubic pain, flank pain, dysuria and fullness in the bladder. She has been hydrating. No documented fever or rigors. She did have diaphoresis this AM. No N/V.  PMH, FamHx and SocHx reviewed for any changes and relevance.  Current Outpatient Prescriptions on File Prior to Visit  Medication Sig Dispense Refill  . acetaminophen (TYLENOL) 500 MG tablet Take 1,000 mg by mouth every 6 (six) hours as needed. pain      . dicyclomine (BENTYL) 10 MG capsule Take 1 capsule (10 mg total) by mouth 3 (three) times daily.  90 capsule  6  . meloxicam (MOBIC) 15 MG tablet Take 1 tablet (15 mg total) by mouth daily.  30 tablet  5  . omeprazole (PRILOSEC OTC) 20 MG tablet Take 20 mg by mouth daily.      [name] oxyCODONE-acetaminophen (ROXICET) 5-325 MG per tablet Take 1 tablet by mouth every 8 (eight) hours as needed for pain.  30 tablet  0  . zolpidem (AMBIEN) 10 MG tablet TAKE 1 TABLET BY MOUTH AT BEDTIME AS NEEDED  30 tablet  2   No current facility-administered medications on file prior to visit.     Review of Systems System review is negative for any constitutional, cardiac, pulmonary, GI or neuro symptoms or complaints other than as described in the HPI.     Objective:   Physical Exam Filed Vitals:   03/02/13 1556  BP: 100/80  Pulse: 80  Temp: 98.1 F (36.7 C)   gen'l- WNWD woman in no distress Cor - 2+ radial, RRR Pulm - normal respirations Abd - tender at suprapubic region to palpation, tender in the left groin, minimal tenderness to percussion left flank.        Assessment & Plan:  Flank pain - dip U/A positive for blood but negative for leukocytes. Suprapubic tenderness but also flank pain. She does have a h/o kidney stone  Plan Septra DS bid x 5 for possible simple cystitis  Strain urine  Toradol 10 mg tid with five day supply for persistent  pain.  For worsening pain, frank blood or renal colic - return for further evaluation.

## 2013-03-07 ENCOUNTER — Other Ambulatory Visit: Payer: Self-pay | Admitting: Internal Medicine

## 2013-03-22 ENCOUNTER — Other Ambulatory Visit: Payer: Self-pay | Admitting: Internal Medicine

## 2013-03-23 NOTE — Telephone Encounter (Signed)
Zolpidem called to pharmacy  

## 2013-04-27 ENCOUNTER — Other Ambulatory Visit: Payer: Self-pay

## 2013-05-23 ENCOUNTER — Other Ambulatory Visit: Payer: Self-pay | Admitting: Internal Medicine

## 2013-06-22 ENCOUNTER — Other Ambulatory Visit: Payer: Self-pay | Admitting: Internal Medicine

## 2013-08-05 ENCOUNTER — Ambulatory Visit (INDEPENDENT_AMBULATORY_CARE_PROVIDER_SITE_OTHER): Payer: BC Managed Care – PPO | Admitting: Internal Medicine

## 2013-08-05 ENCOUNTER — Encounter: Payer: Self-pay | Admitting: Internal Medicine

## 2013-08-05 VITALS — BP 130/90 | HR 85 | Temp 98.0°F | Resp 20 | Ht 62.0 in | Wt 206.0 lb

## 2013-08-05 DIAGNOSIS — L02429 Furuncle of limb, unspecified: Secondary | ICD-10-CM

## 2013-08-05 DIAGNOSIS — L409 Psoriasis, unspecified: Secondary | ICD-10-CM

## 2013-08-05 DIAGNOSIS — L02439 Carbuncle of limb, unspecified: Secondary | ICD-10-CM

## 2013-08-05 DIAGNOSIS — L408 Other psoriasis: Secondary | ICD-10-CM

## 2013-08-05 MED ORDER — TRIAMCINOLONE ACETONIDE 0.1 % EX LOTN: 1.0000 "application " | TOPICAL_LOTION | Freq: Three times a day (TID) | CUTANEOUS | Status: DC

## 2013-08-05 MED ORDER — DOXYCYCLINE HYCLATE 100 MG PO CAPS
100.0000 mg | ORAL_CAPSULE | Freq: Two times a day (BID) | ORAL | Status: DC
Start: 2013-08-05 — End: 2013-10-06

## 2013-08-05 NOTE — Progress Notes (Signed)
Chief Complaint  Patient presents with  . Abscess    boils axilla bilateral    HPI: Patient comes in today for [facility] Saturday clinic for  new problem evaluation. Onset day s of tender boil under left arm has had these before and resolve no fever drainage .  No shaving new topicals. Has itchy scaly patch back of scalp for about a year needs a check of this no hx of psoriasis   ROS: See pertinent positives and negatives per HPI. No cough staph infection hs of hidraddenitis when younger   Past Medical History  Diagnosis Date  . Headache(784.0)   . Allergic rhinitis, cause unspecified   . Ureteral stricture, right   . Stone, bladder   . IBS (irritable bowel syndrome)   . Barrett's esophagus   . Fibromyalgia   . DDD (degenerative disc disease), lumbar   . Chronic back pain   . GERD (gastroesophageal reflux disease)   . Frequency of urination   . Nocturia     Family History  Problem Relation Age of Onset  . COPD Mother   . Emphysema Mother   . Osteoarthritis Mother   . Cancer Father     Prostate  . Coronary artery disease Paternal Grandfather     History   Social History  . Marital Status: Married    Spouse Name: N/A    Number of Children: 2  . Years of Education: 13   Occupational History  . LPN    Social History Main Topics  . Smoking status: Never Smoker   . Smokeless tobacco: Never Used  . Alcohol Use: 0.5 oz/week    1 drink(s) per week     Comment: rarely  . Drug Use: No  . Sexual Activity: Yes    Partners: Male   Other Topics Concern  . None   Social History Narrative   HS[employer], LPN.  married: '83. SO - working as Scientist, water quality. 1 daughter - '83 trisomy 76 defect; 1 son - '87, offers a road-side service; 1 grandson; 1 step -son.  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    Outpatient Encounter Prescriptions as of 08/05/2013  Medication Sig  . acetaminophen (TYLENOL) 500 MG tablet Take 1,000 mg by mouth every 6 (six) hours as needed. pain    . dicyclomine (BENTYL) 10 MG capsule TAKE ONE CAPSULE BY MOUTH 3 TIMES A DAY  . ketorolac (TORADOL) 10 MG tablet TAKE 1 TABLET BY MOUTH 3 TIMES A DAY  . meloxicam (MOBIC) 15 MG tablet Take 1 tablet (15 mg total) by mouth daily.  [name] omeprazole (PRILOSEC OTC) 20 MG tablet Take 20 mg by mouth daily.  [name] oxyCODONE-acetaminophen (ROXICET) 5-325 MG per tablet Take 1 tablet by mouth every 8 (eight) hours as needed for pain.  [name] zolpidem (AMBIEN) 10 MG tablet TAKE 1 TABLET BY MOUTH AT BEDTIME AS NEEDED  . doxycycline (VIBRAMYCIN) 100 MG capsule Take 1 capsule (100 mg total) by mouth 2 (two) times daily.  [name] triamcinolone lotion (KENALOG) 0.1 % Apply 1 application topically 3 (three) times daily.  . [DISCONTINUED] sulfamethoxazole-trimethoprim (BACTRIM DS) 800-160 MG per tablet Take 1 tablet by mouth 2 (two) times daily.    EXAM:  BP 130/90  Pulse 85  Temp(Src) 98 F (36.7 C) (Oral)  Resp 20  Ht 5\' 2"  (1.575 m)  Wt 206 lb (93.441 kg)  BMI 37.67 kg/m2  SpO2 98%  Body mass index is 37.67 kg/(m^2).  GENERAL: vitals reviewed and  listed above, alert, oriented, appears well hydrated and in no acute distress HEENT: atraumatic, conjunctiva  clear, no obvious abnormalities on inspection of external nose and ears NECK: no obvious masses on inspection palpation  MS: moves all extremities without noticeable focal  abnormality Right axilla 3 superficial pink 3-4 mm boils  And sub c tender2 others  No abscess or ob adenopathy left axilla no active lesion Scalp 2+ cm pathc silver scale with surrounding erythema and no adenopathy  PSYCH: pleasant and cooperative, no obvious depression or anxiety  ASSESSMENT AND PLAN:  Discussed the following assessment and plan:  Boil, axilla - looks like hidradenitis type  but no abscess multip areas  no drainage or fluctuance antibiotic and local measures   Scalp psoriasis - one patch probable  try topical and recheck no other sx    Expectant management. And fu  Get  recheck if not faded no obv adenopathy  -Patient advised to return or notify health care team  if symptoms worsen or persist or new concerns arise.  Patient Instructions  These seem like surface boil or sweat gland infections. Continue warm compresses and add antibiotic The scalp area looks like psoriasis   Add topical steroid for 2-4 weeks and get recheck .     Standley Brooking. Eleshia Wooley M.D.

## 2013-08-05 NOTE — Patient Instructions (Signed)
These seem like surface boil or sweat gland infections. Continue warm compresses and add antibiotic The scalp area looks like psoriasis   Add topical steroid for 2-4 weeks and get recheck .

## 2013-08-05 NOTE — Progress Notes (Signed)
Pre-visit discussion using our clinic review tool. No additional management support is needed unless otherwise documented below in the visit note.  

## 2013-08-15 ENCOUNTER — Other Ambulatory Visit: Payer: Self-pay | Admitting: Internal Medicine

## 2013-09-27 ENCOUNTER — Telehealth: Payer: Self-pay | Admitting: *Deleted

## 2013-09-27 ENCOUNTER — Telehealth: Payer: Self-pay | Admitting: Internal Medicine

## 2013-09-27 MED ORDER — ZOLPIDEM TARTRATE 10 MG PO TABS: 10.0000 mg | ORAL_TABLET | Freq: Every evening | ORAL | Status: DC | PRN

## 2013-09-27 NOTE — Telephone Encounter (Signed)
Called pt back no answer x's 10 rings. Can't leave msg vm not set-up. Faxing script to [facility]...Johny Chess

## 2013-09-27 NOTE — Telephone Encounter (Signed)
Pt called requesting Ambien refill. Former pt of [facility], has an appointment to see you for her follow up next week, 4/10. She is completely out and is asking if she can have enough to last her until next week Friday.  Please advise

## 2013-09-27 NOTE — Telephone Encounter (Signed)
Pt called requesting Ambien refill.  Former pt of [facility], no new PCP.  Please advise

## 2013-09-27 NOTE — Telephone Encounter (Signed)
She has to be seen

## 2013-09-27 NOTE — Telephone Encounter (Signed)
Information given to scheduling for appointment.

## 2013-09-27 NOTE — Telephone Encounter (Signed)
30d rx done 

## 2013-10-06 ENCOUNTER — Ambulatory Visit (INDEPENDENT_AMBULATORY_CARE_PROVIDER_SITE_OTHER): Payer: BC Managed Care – PPO | Admitting: Internal Medicine

## 2013-10-06 ENCOUNTER — Encounter: Payer: Self-pay | Admitting: Internal Medicine

## 2013-10-06 VITALS — BP 128/84 | HR 88 | Temp 98.5°F | Wt 205.4 lb

## 2013-10-06 DIAGNOSIS — F329 Major depressive disorder, single episode, unspecified: Secondary | ICD-10-CM

## 2013-10-06 DIAGNOSIS — Z0000 Encounter for general adult medical examination without abnormal findings: Secondary | ICD-10-CM

## 2013-10-06 DIAGNOSIS — F3289 Other specified depressive episodes: Secondary | ICD-10-CM

## 2013-10-06 DIAGNOSIS — M549 Dorsalgia, unspecified: Secondary | ICD-10-CM

## 2013-10-06 DIAGNOSIS — Z1211 Encounter for screening for malignant neoplasm of colon: Secondary | ICD-10-CM

## 2013-10-06 DIAGNOSIS — Z1239 Encounter for other screening for malignant neoplasm of breast: Secondary | ICD-10-CM

## 2013-10-06 DIAGNOSIS — F32A Depression, unspecified: Secondary | ICD-10-CM

## 2013-10-06 DIAGNOSIS — J019 Acute sinusitis, unspecified: Secondary | ICD-10-CM

## 2013-10-06 MED ORDER — DICYCLOMINE HCL 10 MG PO CAPS: 10.0000 mg | ORAL_CAPSULE | Freq: Three times a day (TID) | ORAL | Status: DC

## 2013-10-06 MED ORDER — ESCITALOPRAM OXALATE 5 MG PO TABS: 5.0000 mg | ORAL_TABLET | Freq: Every day | ORAL | Status: DC

## 2013-10-06 MED ORDER — AMOXICILLIN-POT CLAVULANATE 875-125 MG PO TABS: 1.0000 | ORAL_TABLET | Freq: Two times a day (BID) | ORAL | Status: AC

## 2013-10-06 MED ORDER — ZOLPIDEM TARTRATE 10 MG PO TABS: 10.0000 mg | ORAL_TABLET | Freq: Every evening | ORAL | Status: DC | PRN

## 2013-10-06 MED ORDER — MELOXICAM 15 MG PO TABS: 15.0000 mg | ORAL_TABLET | Freq: Every day | ORAL | Status: DC

## 2013-10-06 NOTE — Patient Instructions (Addendum)
It was good to see you today.  We have reviewed your prior records including labs and tests today  Health Maintenance reviewed - refer for mammogram and colonoscopy -all other recommended immunizations and age-appropriate screenings are up-to-date.  Test(s) ordered today. Return when you are fasting. Your results will be released to [place] (or called to you) after review, usually within 72hours after test completion. If any changes need to be made, you will be notified at that same time.  Medications reviewed and updated start Augmentin twice a day for one week to treat acute sinus infection Start Lexapro 5 mg once daily for depression symptoms Your prescription(s)/refills have been submitted to your pharmacy. Please take as directed and contact our office if you believe you are having problem(s) with the medication(s).  Please schedule followup in 6 weeks for recheck and medication adjustment if needed, call sooner if problems.  Health Maintenance, Female A healthy lifestyle and preventative care can promote health and wellness.  Maintain regular health, dental, and eye exams.  Eat a healthy diet. Foods like vegetables, fruits, whole grains, low-fat dairy products, and lean protein foods contain the nutrients you need without too many calories. Decrease your intake of foods high in solid fats, added sugars, and salt. Get information about a proper diet from your caregiver, if necessary.  Regular physical exercise is one of the most important things you can do for your health. Most adults should get at least 150 minutes of moderate-intensity exercise (any activity that increases your heart rate and causes you to sweat) each week. In addition, most adults need muscle-strengthening exercises on 2 or more days a week.   Maintain a healthy weight. The body mass index (BMI) is a screening tool to identify possible weight problems. It provides an estimate of body fat based on height and weight.  Your caregiver can help determine your BMI, and can help you achieve or maintain a healthy weight. For adults 20 years and older:  A BMI below 18.5 is considered underweight.  A BMI of 18.5 to 24.9 is normal.  A BMI of 25 to 29.9 is considered overweight.  A BMI of 30 and above is considered obese.  Maintain normal blood lipids and cholesterol by exercising and minimizing your intake of saturated fat. Eat a balanced diet with plenty of fruits and vegetables. Blood tests for lipids and cholesterol should begin at age 13 and be repeated every 5 years. If your lipid or cholesterol levels are high, you are over 50, or you are a high risk for heart disease, you may need your cholesterol levels checked more frequently.Ongoing high lipid and cholesterol levels should be treated with medicines if diet and exercise are not effective.  If you smoke, find out from your caregiver how to quit. If you do not use tobacco, do not start.  Lung cancer screening is recommended for adults aged 32 80 years who are at high risk for developing lung cancer because of a history of smoking. Yearly low-dose computed tomography (CT) is recommended for people who have at least a 30-pack-year history of smoking and are a current smoker or have quit within the past 15 years. A pack year of smoking is smoking an average of 1 pack of cigarettes a day for 1 year (for example: 1 pack a day for 30 years or 2 packs a day for 15 years). Yearly screening should continue until the smoker has stopped smoking for at least 15 years. Yearly screening should also be  stopped for people who develop a health problem that would prevent them from having lung cancer treatment.  If you are pregnant, do not drink alcohol. If you are breastfeeding, be very cautious about drinking alcohol. If you are not pregnant and choose to drink alcohol, do not exceed 1 drink per day. One drink is considered to be 12 ounces (355 mL) of beer, 5 ounces (148 mL) of  wine, or 1.5 ounces (44 mL) of liquor.  Avoid use of street drugs. Do not share needles with anyone. Ask for help if you need support or instructions about stopping the use of drugs.  High blood pressure causes heart disease and increases the risk of stroke. Blood pressure should be checked at least every 1 to 2 years. Ongoing high blood pressure should be treated with medicines, if weight loss and exercise are not effective.  If you are 4 to 64 years old, ask your caregiver if you should take aspirin to prevent strokes.  Diabetes screening involves taking a blood sample to check your fasting blood sugar level. This should be done once every 3 years, after age 33, if you are within normal weight and without risk factors for diabetes. Testing should be considered at a younger age or be carried out more frequently if you are overweight and have at least 1 risk factor for diabetes.  Breast cancer screening is essential preventative care for women. You should practice "breast self-awareness." This means understanding the normal appearance and feel of your breasts and may include breast self-examination. Any changes detected, no matter how small, should be reported to a caregiver. Women in their 91s and 30s should have a clinical breast exam (CBE) by a caregiver as part of a regular health exam every 1 to 3 years. After age 89, women should have a CBE every year. Starting at age 41, women should consider having a mammogram (breast X-ray) every year. Women who have a family history of breast cancer should talk to their caregiver about genetic screening. Women at a high risk of breast cancer should talk to their caregiver about having an MRI and a mammogram every year.  Breast cancer gene (BRCA)-related cancer risk assessment is recommended for women who have family members with BRCA-related cancers. BRCA-related cancers include breast, ovarian, tubal, and peritoneal cancers. Having family members with these  cancers may be associated with an increased risk for harmful changes (mutations) in the breast cancer genes BRCA1 and BRCA2. Results of the assessment will determine the need for genetic counseling and BRCA1 and BRCA2 testing.  The Pap test is a screening test for cervical cancer. Women should have a Pap test starting at age 74. Between ages 75 and 25, Pap tests should be repeated every 2 years. Beginning at age 47, you should have a Pap test every 3 years as long as the past 3 Pap tests have been normal. If you had a hysterectomy for a problem that was not cancer or a condition that could lead to cancer, then you no longer need Pap tests. If you are between ages 37 and 32, and you have had normal Pap tests going back 10 years, you no longer need Pap tests. If you have had past treatment for cervical cancer or a condition that could lead to cancer, you need Pap tests and screening for cancer for at least 20 years after your treatment. If Pap tests have been discontinued, risk factors (such as a new sexual partner) need to be reassessed to  determine if screening should be resumed. Some women have medical problems that increase the chance of getting cervical cancer. In these cases, your caregiver may recommend more frequent screening and Pap tests.  The human papillomavirus (HPV) test is an additional test that may be used for cervical cancer screening. The HPV test looks for the virus that can cause the cell changes on the cervix. The cells collected during the Pap test can be tested for HPV. The HPV test could be used to screen women aged 56 years and older, and should be used in women of any age who have unclear Pap test results. After the age of 50, women should have HPV testing at the same frequency as a Pap test.  Colorectal cancer can be detected and often prevented. Most routine colorectal cancer screening begins at the age of 33 and continues through age 66. However, your caregiver may recommend  screening at an earlier age if you have risk factors for colon cancer. On a yearly basis, your caregiver may provide home test kits to check for hidden blood in the stool. Use of a small camera at the end of a tube, to directly examine the colon (sigmoidoscopy or colonoscopy), can detect the earliest forms of colorectal cancer. Talk to your caregiver about this at age 50, when routine screening begins. Direct examination of the colon should be repeated every 5 to 10 years through age 37, unless early forms of pre-cancerous polyps or small growths are found.  Hepatitis C blood testing is recommended for all people born from [birth year] through 1966 and any individual with known risks for hepatitis C.  Practice safe sex. Use condoms and avoid high-risk sexual practices to reduce the spread of sexually transmitted infections (STIs). Sexually active women aged 66 and younger should be checked for Chlamydia, which is a common sexually transmitted infection. Older women with new or multiple partners should also be tested for Chlamydia. Testing for other STIs is recommended if you are sexually active and at increased risk.  Osteoporosis is a disease in which the bones lose minerals and strength with aging. This can result in serious bone fractures. The risk of osteoporosis can be identified using a bone density scan. Women ages 31 and over and women at risk for fractures or osteoporosis should discuss screening with their caregivers. Ask your caregiver whether you should be taking a calcium supplement or vitamin D to reduce the rate of osteoporosis.  Menopause can be associated with physical symptoms and risks. Hormone replacement therapy is available to decrease symptoms and risks. You should talk to your caregiver about whether hormone replacement therapy is right for you.  Use sunscreen. Apply sunscreen liberally and repeatedly throughout the day. You should seek shade when your shadow is shorter than you. Protect  yourself by wearing long sleeves, pants, a wide-brimmed hat, and sunglasses year round, whenever you are outdoors.  Notify your caregiver of new moles or changes in moles, especially if there is a change in shape or color. Also notify your caregiver if a mole is larger than the size of a pencil eraser.  Stay current with your immunizations. Document Released: 12/22/2010 Document Revised: 10/03/2012 Document Reviewed: 12/22/2010 [facility] Patient Information 2014 [place].

## 2013-10-06 NOTE — Progress Notes (Signed)
Pre visit review using our clinic review tool, if applicable. No additional management support is needed unless otherwise documented below in the visit note. 

## 2013-10-06 NOTE — Progress Notes (Signed)
Subjective:    Patient ID: Miranda Mathis, female    DOB: 03-20-1951, 63 y.o.   MRN: [id number]  HPI  New to me - transfer from [facility] due to retirment  Here for medicare wellness  Diet: heart healthy Physical activity: sedentary Depression/mood screen: see below Hearing: intact to whispered voice Visual acuity: grossly normal, performs annual eye exam  ADLs: capable Fall risk: none Home safety: good Cognitive evaluation: intact to orientation, naming, recall and repetition EOL planning: adv directives, full code/ I agree  I have personally reviewed and have noted 1. The patient's medical and social history 2. Their use of alcohol, tobacco or illicit drugs 3. Their current medications and supplements 4. The patient's functional ability including ADL's, fall risks, home safety risks and hearing or visual impairment. 5. Diet and physical activities 6. Evidence for depression or mood disorders  Also reviewed chronic medical issues and interval medical events  Past Medical History  Diagnosis Date  . Headache(784.0)   . Allergic rhinitis, cause unspecified   . Ureteral stricture, right   . Stone, bladder   . IBS (irritable bowel syndrome)   . Barrett's esophagus   . Fibromyalgia   . DDD (degenerative disc disease), lumbar   . Chronic back pain   . GERD (gastroesophageal reflux disease)    Family History  Problem Relation Age of Onset  . COPD Mother   . Emphysema Mother   . Osteoarthritis Mother   . Cancer Father     Prostate  . Coronary artery disease Paternal Grandfather    History  Substance Use Topics  . Smoking status: Never Smoker   . Smokeless tobacco: Never Used  . Alcohol Use: 0.5 oz/week    1 drink(s) per week     Comment: rarely    Review of Systems  Constitutional: Negative for fatigue and unexpected weight change.  HENT: Positive for postnasal drip, sinus pressure, sore throat and voice change (x 3 days). Negative for rhinorrhea.   Respiratory:  Negative for cough, shortness of breath and wheezing.   Cardiovascular: Negative for chest pain, palpitations and leg swelling.  Gastrointestinal: Negative for nausea, abdominal pain and diarrhea.  Neurological: Positive for headaches (fronta/sinus). Negative for dizziness, weakness and light-headedness.  Psychiatric/Behavioral: Positive for sleep disturbance, dysphoric mood ("irritable") and decreased concentration. Negative for suicidal ideas and self-injury. The patient is nervous/anxious.   All other systems reviewed and are negative.      Objective:   Physical Exam  BP 128/84  Pulse 88  Temp(Src) 98.5 F (36.9 C) (Oral)  Wt 205 lb 6.4 oz (93.169 kg)  SpO2 95% Wt Readings from Last 3 Encounters:  10/06/13 205 lb 6.4 oz (93.169 kg)  08/05/13 206 lb (93.441 kg)  03/02/13 200 lb 6.4 oz (90.901 kg)   Constitutional: She appears well-developed and well-nourished. No distress.  HENT: Head: Normocephalic and atraumatic. Ears: B TMs ok, no erythema or effusion; Nose: Nose normal. Mouth/Throat: Oropharynx is clear and moist. No oropharyngeal exudate.  Eyes: Conjunctivae and EOM are normal. Pupils are equal, round, and reactive to light. No scleral icterus.  Neck: Normal range of motion. Neck supple. No JVD present. No thyromegaly present.  Cardiovascular: Normal rate, regular rhythm and normal heart sounds.  No murmur heard. No BLE edema. Pulmonary/Chest: Effort normal and breath sounds normal. No respiratory distress. She has no wheezes.  Abdominal: Soft. Bowel sounds are normal. She exhibits no distension. There is no tenderness. no masses Musculoskeletal: Normal range of motion, no joint  effusions. No gross deformities Neurological: She is alert and oriented to person, place, and time. No cranial nerve deficit. Coordination, balance, strength, speech and gait are normal.  Skin: Skin is warm and dry. No rash noted. No erythema.  Psychiatric: She has a polite, reserved and occ  dysphoric/tearful mood and affect. Her behavior is normal. Judgment and thought content normal.    Lab Results  Component Value Date   WBC 6.0 11/13/2010   HGB 13.3 12/20/2011   HCT 39.0 12/20/2011   PLT 175.0 11/13/2010   GLUCOSE 122* 12/20/2011   CHOL 201* 11/13/2010   TRIG 81.0 11/13/2010   HDL 41.20 11/13/2010   LDLDIRECT 165.3 11/13/2010   ALT 37* 11/13/2010   AST 30 11/13/2010   NA 141 12/20/2011   K 4.1 12/20/2011   CL 106 12/20/2011   CREATININE 0.90 12/20/2011   BUN 20 12/20/2011   CO2 27 11/13/2010   TSH 6.92* 11/13/2010    No results found.     Assessment & Plan:   CPX/v70.0 - Patient has been counseled on age-appropriate routine health concerns for screening and prevention. These are reviewed and up-to-date. Immunizations are up-to-date or declined. Labs ordered and reviewed.  Acute on chronic sinusitis. Prescribed Augmentin. Advised continued antihistamine and over-the-counter nasal steroid use -patient agrees to call symptoms worse or unimproved  Problem List Items Addressed This Visit   BACK PAIN, CHRONIC     Chronic pain, uses Percocet rarely when severe (<#30/yr) No new symptoms or change in chronic condition    Relevant Medications      ketorolac (TORADOL) 10 MG tablet      meloxicam (MOBIC) tablet   Depression     Exacerbated by situational/family stressors -63 yo dtr Brandie with learning/devlopmental disorder Reports history of same, but increasing irritability over past 3 months with increased tearful spells and poor sleep Denies SI/HI Agreeable to initiate trial medication at this time Low-dose Lexapro selected Also advised counseling, patient reports support for same via daughters' provider followup in 6 weeks for medication titration, patient agrees to call sooner if symptoms worse or unimproved    Relevant Medications      escitalopram (LEXAPRO) tablet    Other Visit Diagnoses   Routine general medical examination at a health care facility    -  Primary     Relevant Orders       Basic metabolic panel       CBC with Differential       Hepatic function panel       Lipid panel       TSH       Urinalysis, Routine w reflex microscopic    Acute sinus infection        Relevant Medications       AUGMENTIN 875-125 MG PO TABS    Other screening breast examination        Relevant Orders       MM DIGITAL SCREENING BILATERAL    Special screening for malignant neoplasms, colon        Relevant Orders       Ambulatory referral to Gastroenterology

## 2013-10-06 NOTE — Assessment & Plan Note (Signed)
Exacerbated by situational/family stressors -63 yo dtr Miranda Mathis with learning/devlopmental disorder Reports history of same, but increasing irritability over past 3 months with increased tearful spells and poor sleep Denies SI/HI Agreeable to initiate trial medication at this time Low-dose Lexapro selected Also advised counseling, patient reports support for same via daughters' provider followup in 6 weeks for medication titration, patient agrees to call sooner if symptoms worse or unimproved

## 2013-10-06 NOTE — Assessment & Plan Note (Signed)
Chronic pain, uses Percocet rarely when severe (<#30/yr) No new symptoms or change in chronic condition

## 2013-10-10 ENCOUNTER — Encounter: Payer: Self-pay | Admitting: Internal Medicine

## 2013-10-27 LAB — HM MAMMOGRAPHY

## 2013-11-02 ENCOUNTER — Other Ambulatory Visit (INDEPENDENT_AMBULATORY_CARE_PROVIDER_SITE_OTHER): Payer: BC Managed Care – PPO

## 2013-11-02 ENCOUNTER — Encounter: Payer: Self-pay | Admitting: Internal Medicine

## 2013-11-02 ENCOUNTER — Other Ambulatory Visit: Payer: Self-pay | Admitting: Internal Medicine

## 2013-11-02 DIAGNOSIS — Z0000 Encounter for general adult medical examination without abnormal findings: Secondary | ICD-10-CM

## 2013-11-02 DIAGNOSIS — E039 Hypothyroidism, unspecified: Secondary | ICD-10-CM | POA: Insufficient documentation

## 2013-11-02 LAB — CBC WITH DIFFERENTIAL/PLATELET
BASOS PCT: 0.3 % (ref 0.0–3.0)
Basophils Absolute: 0 10*3/uL (ref 0.0–0.1)
EOS ABS: 0.3 10*3/uL (ref 0.0–0.7)
Eosinophils Relative: 5 % (ref 0.0–5.0)
HCT: 45.3 % (ref 36.0–46.0)
Hemoglobin: 14.9 g/dL (ref 12.0–15.0)
Lymphocytes Relative: 30.1 % (ref 12.0–46.0)
Lymphs Abs: 2 10*3/uL (ref 0.7–4.0)
MCHC: 32.8 g/dL (ref 30.0–36.0)
MCV: 90.3 fl (ref 78.0–100.0)
MONO ABS: 0.6 10*3/uL (ref 0.1–1.0)
Monocytes Relative: 8.5 % (ref 3.0–12.0)
Neutro Abs: 3.6 10*3/uL (ref 1.4–7.7)
Neutrophils Relative %: 56.1 % (ref 43.0–77.0)
PLATELETS: 152 10*3/uL (ref 150.0–400.0)
RBC: 5.02 Mil/uL (ref 3.87–5.11)
RDW: 13.7 % (ref 11.5–15.5)
WBC: 6.5 10*3/uL (ref 4.0–10.5)

## 2013-11-02 LAB — LIPID PANEL
CHOLESTEROL: 203 mg/dL — AB (ref 0–200)
HDL: 37.4 mg/dL — ABNORMAL LOW (ref >39.00)
LDL Cholesterol: 145 mg/dL — ABNORMAL HIGH (ref 0–99)
Total CHOL/HDL Ratio: 5
Triglycerides: 105 mg/dL (ref 0.0–149.0)
VLDL: 21 mg/dL (ref 0.0–40.0)

## 2013-11-02 LAB — URINALYSIS, ROUTINE W REFLEX MICROSCOPIC
Bilirubin Urine: NEGATIVE
Hgb urine dipstick: NEGATIVE
Ketones, ur: NEGATIVE
Nitrite: NEGATIVE
Specific Gravity, Urine: 1.025 (ref 1.000–1.030)
Total Protein, Urine: NEGATIVE
UROBILINOGEN UA: 0.2 (ref 0.0–1.0)
Urine Glucose: NEGATIVE
pH: 5.5 (ref 5.0–8.0)

## 2013-11-02 LAB — BASIC METABOLIC PANEL
BUN: 14 mg/dL (ref 6–23)
CHLORIDE: 108 meq/L (ref 96–112)
CO2: 25 meq/L (ref 19–32)
CREATININE: 0.9 mg/dL (ref 0.4–1.2)
Calcium: 8.9 mg/dL (ref 8.4–10.5)
GFR: 63.83 mL/min (ref >60.00)
Glucose, Bld: 115 mg/dL — ABNORMAL HIGH (ref 70–99)
Potassium: 4.2 mEq/L (ref 3.5–5.1)
Sodium: 139 mEq/L (ref 135–145)

## 2013-11-02 LAB — TSH: TSH: 13.95 u[IU]/mL — AB (ref 0.35–4.50)

## 2013-11-02 LAB — HEPATIC FUNCTION PANEL
ALT: 53 U/L — AB (ref 0–35)
AST: 38 U/L — AB (ref 0–37)
Albumin: 4 g/dL (ref 3.5–5.2)
Alkaline Phosphatase: 72 U/L (ref 39–117)
BILIRUBIN DIRECT: 0.1 mg/dL (ref 0.0–0.3)
BILIRUBIN TOTAL: 0.4 mg/dL (ref 0.2–1.2)
Total Protein: 7.1 g/dL (ref 6.0–8.3)

## 2013-11-02 MED ORDER — LEVOTHYROXINE SODIUM 75 MCG PO TABS: 75.0000 ug | ORAL_TABLET | Freq: Every day | ORAL | Status: DC

## 2013-11-03 ENCOUNTER — Encounter: Payer: Self-pay | Admitting: Internal Medicine

## 2013-11-15 ENCOUNTER — Ambulatory Visit (INDEPENDENT_AMBULATORY_CARE_PROVIDER_SITE_OTHER): Payer: BC Managed Care – PPO | Admitting: Internal Medicine

## 2013-11-15 ENCOUNTER — Encounter: Payer: Self-pay | Admitting: Internal Medicine

## 2013-11-15 VITALS — BP 122/82 | HR 71 | Temp 98.4°F | Wt 203.6 lb

## 2013-11-15 DIAGNOSIS — I10 Essential (primary) hypertension: Secondary | ICD-10-CM

## 2013-11-15 DIAGNOSIS — E039 Hypothyroidism, unspecified: Secondary | ICD-10-CM

## 2013-11-15 DIAGNOSIS — F32A Depression, unspecified: Secondary | ICD-10-CM

## 2013-11-15 DIAGNOSIS — F3289 Other specified depressive episodes: Secondary | ICD-10-CM

## 2013-11-15 DIAGNOSIS — F329 Major depressive disorder, single episode, unspecified: Secondary | ICD-10-CM

## 2013-11-15 DIAGNOSIS — N390 Urinary tract infection, site not specified: Secondary | ICD-10-CM

## 2013-11-15 MED ORDER — CIPROFLOXACIN HCL 500 MG PO TABS: 500.0000 mg | ORAL_TABLET | Freq: Two times a day (BID) | ORAL | Status: DC

## 2013-11-15 NOTE — Progress Notes (Signed)
Pre visit review using our clinic review tool, if applicable. No additional management support is needed unless otherwise documented below in the visit note. 

## 2013-11-15 NOTE — Assessment & Plan Note (Signed)
BP Readings from Last 3 Encounters:  11/15/13 122/82  10/06/13 128/84  08/05/13 130/90   Hx same but not on meds for same for years Will resolve problem at this time

## 2013-11-15 NOTE — Patient Instructions (Signed)
It was good to see you today.  We have reviewed your prior records including labs and tests today  Test(s) ordered today. Your results will be released to [place] (or called to you) after review, usually within 72hours after test completion. If any changes need to be made, you will be notified at that same time.  Medications reviewed and updated, no changes recommended at this time.  Please schedule followup in 12 months for annual exam, call sooner if problems.

## 2013-11-15 NOTE — Assessment & Plan Note (Signed)
New dx 10/2013 CPX labs started on meds for same Recheck TSH now and adjust as needed Lab Results  Component Value Date   TSH 13.95* 11/02/2013

## 2013-11-15 NOTE — Assessment & Plan Note (Signed)
Exacerbated by situational/family stressors -63 yo dtr Miranda Mathis with learning/devlopmental disorder Low-dose Lexapro begun 09/2013 - improving symptoms  The current medical regimen is effective;  continue present plan and medications.

## 2013-11-15 NOTE — Progress Notes (Signed)
   Subjective:    Patient ID: Miranda Mathis, female    DOB: [birth year], 63 y.o.   MRN: [id number]  HPI  Patient is here for follow up - depression and low TSH Reviewed chronic medical issues and interval medical events  Past Medical History  Diagnosis Date  . Headache(784.0)   . Allergic rhinitis, cause unspecified   . Ureteral stricture, right   . Stone, bladder   . IBS (irritable bowel syndrome)   . Barrett's esophagus   . Fibromyalgia   . DDD (degenerative disc disease), lumbar   . Chronic back pain   . GERD (gastroesophageal reflux disease)   . Hypothyroid 11/02/2013 dx    Review of Systems  Respiratory: Negative for cough and shortness of breath.   Cardiovascular: Negative for chest pain and leg swelling.  Genitourinary: Positive for dysuria (x 2 days) and frequency. Negative for flank pain.  Psychiatric/Behavioral: Negative for confusion and decreased concentration.       Objective:   Physical Exam  BP 122/82  Pulse 71  Temp(Src) 98.4 F (36.9 C) (Oral)  Wt 203 lb 9.6 oz (92.352 kg)  SpO2 94% Wt Readings from Last 3 Encounters:  11/15/13 203 lb 9.6 oz (92.352 kg)  10/06/13 205 lb 6.4 oz (93.169 kg)  08/05/13 206 lb (93.441 kg)   Constitutional: She appears well-developed and well-nourished. No distress.  Neck: Normal range of motion. Neck supple. No JVD present. No thyromegaly present.  Cardiovascular: Normal rate, regular rhythm and normal heart sounds.  No murmur heard. No BLE edema. Pulmonary/Chest: Effort normal and breath sounds normal. No respiratory distress. She has no wheezes.  Psychiatric: She has a normal mood and affect. Her behavior is normal. Judgment and thought content normal.   Lab Results  Component Value Date   WBC 6.5 11/02/2013   HGB 14.9 11/02/2013   HCT 45.3 11/02/2013   PLT 152.0 11/02/2013   GLUCOSE 115* 11/02/2013   CHOL 203* 11/02/2013   TRIG 105.0 11/02/2013   HDL 37.40* 11/02/2013   LDLDIRECT 165.3 11/13/2010   LDLCALC 145*  11/02/2013   ALT 53* 11/02/2013   AST 38* 11/02/2013   NA 139 11/02/2013   K 4.2 11/02/2013   CL 108 11/02/2013   CREATININE 0.9 11/02/2013   BUN 14 11/02/2013   CO2 25 11/02/2013   TSH 13.95* 11/02/2013    No results found.     Assessment & Plan:   symptomatic UTI - tx empirically - Cipro x 7 d  Problem List Items Addressed This Visit   Depression     Exacerbated by situational/family stressors -63 yo dtr Brandie with learning/devlopmental disorder Low-dose Lexapro begun 09/2013 - improving symptoms  The current medical regimen is effective;  continue present plan and medications.     RESOLVED: HYPERTENSION      BP Readings from Last 3 Encounters:  11/15/13 122/82  10/06/13 128/84  08/05/13 130/90   Hx same but not on meds for same for years Will resolve problem at this time    Hypothyroid      New dx 10/2013 CPX labs started on meds for same Recheck TSH now and adjust as needed Lab Results  Component Value Date   TSH 13.95* 11/02/2013       Other Visit Diagnoses   Unspecified hypothyroidism    -  Primary    Relevant Orders       TSH

## 2013-11-16 ENCOUNTER — Telehealth: Payer: Self-pay | Admitting: Internal Medicine

## 2013-11-16 NOTE — Telephone Encounter (Signed)
Relevant patient education assigned to patient using Emmi. ° °

## 2013-11-23 ENCOUNTER — Encounter: Payer: Self-pay | Admitting: Internal Medicine

## 2013-11-23 ENCOUNTER — Ambulatory Visit (AMBULATORY_SURGERY_CENTER): Payer: Self-pay | Admitting: *Deleted

## 2013-11-23 ENCOUNTER — Telehealth: Payer: Self-pay | Admitting: *Deleted

## 2013-11-23 VITALS — Ht 62.0 in | Wt 203.8 lb

## 2013-11-23 DIAGNOSIS — Z1211 Encounter for screening for malignant neoplasm of colon: Secondary | ICD-10-CM

## 2013-11-23 NOTE — Telephone Encounter (Signed)
Dr Carlean Purl:  Pt is scheduled for direct colonoscopy 12/15/23.  Last colonoscopy 2004 with you was normal.  While reviewing chart I see that pt also had EGD in 2004 with dx of Barrett's Esophagus.  She was due for recall EGD in 2007 but never had procedure  Can she have direct EGD/Colonoscopy or do you want her to have OV with you first?  Thanks, Juliann Pulse

## 2013-11-23 NOTE — Progress Notes (Signed)
No allergies to eggs or soy. No problems with anesthesia.  Pt given Emmi instructions for colonoscopy  No oxygen use  No diet drug use  Pt given sample Suprep in PV

## 2013-11-24 NOTE — Telephone Encounter (Signed)
Please arrange EGD/colon direct

## 2013-11-24 NOTE — Telephone Encounter (Signed)
Noted.  Spoke with pt and new appt for ECL made for 01-10-14 at 3:30 p.m.  New instructions mailed to pt

## 2013-11-28 ENCOUNTER — Encounter: Payer: Self-pay | Admitting: Internal Medicine

## 2013-12-13 ENCOUNTER — Other Ambulatory Visit: Payer: Self-pay | Admitting: *Deleted

## 2013-12-13 MED ORDER — MELOXICAM 15 MG PO TABS: 15.0000 mg | ORAL_TABLET | Freq: Every day | ORAL | Status: DC

## 2013-12-14 ENCOUNTER — Encounter: Payer: BC Managed Care – PPO | Admitting: Internal Medicine

## 2014-01-10 ENCOUNTER — Encounter: Payer: BC Managed Care – PPO | Admitting: Internal Medicine

## 2014-01-29 ENCOUNTER — Other Ambulatory Visit (INDEPENDENT_AMBULATORY_CARE_PROVIDER_SITE_OTHER): Payer: BC Managed Care – PPO

## 2014-01-29 DIAGNOSIS — E039 Hypothyroidism, unspecified: Secondary | ICD-10-CM

## 2014-01-29 LAB — TSH: TSH: 4.75 u[IU]/mL — ABNORMAL HIGH (ref 0.35–4.50)

## 2014-01-30 ENCOUNTER — Other Ambulatory Visit: Payer: Self-pay | Admitting: Internal Medicine

## 2014-01-30 MED ORDER — LEVOTHYROXINE SODIUM 88 MCG PO TABS: 88.0000 ug | ORAL_TABLET | Freq: Every day | ORAL | Status: DC

## 2014-04-10 ENCOUNTER — Other Ambulatory Visit: Payer: Self-pay

## 2014-04-10 MED ORDER — ESCITALOPRAM OXALATE 5 MG PO TABS: 5.0000 mg | ORAL_TABLET | Freq: Every day | ORAL | Status: DC

## 2014-04-28 ENCOUNTER — Other Ambulatory Visit: Payer: Self-pay | Admitting: Internal Medicine

## 2014-05-05 ENCOUNTER — Other Ambulatory Visit: Payer: Self-pay | Admitting: Internal Medicine

## 2014-06-19 ENCOUNTER — Other Ambulatory Visit: Payer: Self-pay | Admitting: Internal Medicine

## 2014-06-25 ENCOUNTER — Other Ambulatory Visit: Payer: Self-pay | Admitting: Internal Medicine

## 2014-10-04 ENCOUNTER — Other Ambulatory Visit: Payer: Self-pay | Admitting: Internal Medicine

## 2014-10-22 ENCOUNTER — Other Ambulatory Visit: Payer: Self-pay | Admitting: Internal Medicine

## 2014-10-23 ENCOUNTER — Telehealth: Payer: Self-pay | Admitting: Internal Medicine

## 2014-10-23 NOTE — Telephone Encounter (Signed)
MD out of office pls advise on refill.../lmb 

## 2014-10-23 NOTE — Telephone Encounter (Signed)
Pt called in said that she has an appt in July for cpe but she is out of her zolpidem (AMBIEN) 10 MG tablet [[id number]].  She wants to know if this can be refilled just till she can get here in July???

## 2014-10-24 NOTE — Telephone Encounter (Signed)
Jonelle Sidle can you pls call this pt and let her know what Dr. Ronnald Ramp stated...Johny Chess

## 2014-10-24 NOTE — Telephone Encounter (Signed)
Advised patient of dr Ronnald Ramp note, patient has scheduled appt with greg calone for 5/5

## 2014-10-24 NOTE — Telephone Encounter (Signed)
No- she has not been seen in a year, must come in for a thyroid check and refill

## 2014-10-24 NOTE — Telephone Encounter (Signed)
MD out of office pls advise.../lmb 

## 2014-10-25 ENCOUNTER — Telehealth: Payer: Self-pay | Admitting: Family

## 2014-10-25 ENCOUNTER — Encounter: Payer: Self-pay | Admitting: Family

## 2014-10-25 ENCOUNTER — Other Ambulatory Visit (INDEPENDENT_AMBULATORY_CARE_PROVIDER_SITE_OTHER): Payer: 59

## 2014-10-25 ENCOUNTER — Ambulatory Visit (INDEPENDENT_AMBULATORY_CARE_PROVIDER_SITE_OTHER): Payer: 59 | Admitting: Family

## 2014-10-25 VITALS — BP 132/84 | HR 78 | Temp 98.4°F | Resp 18 | Ht 62.0 in | Wt 206.8 lb

## 2014-10-25 DIAGNOSIS — E039 Hypothyroidism, unspecified: Secondary | ICD-10-CM

## 2014-10-25 DIAGNOSIS — G4700 Insomnia, unspecified: Secondary | ICD-10-CM | POA: Diagnosis not present

## 2014-10-25 DIAGNOSIS — F329 Major depressive disorder, single episode, unspecified: Secondary | ICD-10-CM

## 2014-10-25 DIAGNOSIS — F5101 Primary insomnia: Secondary | ICD-10-CM | POA: Insufficient documentation

## 2014-10-25 DIAGNOSIS — F32A Depression, unspecified: Secondary | ICD-10-CM

## 2014-10-25 LAB — TSH: TSH: 4.58 u[IU]/mL — ABNORMAL HIGH (ref 0.35–4.50)

## 2014-10-25 MED ORDER — LEVOTHYROXINE SODIUM 100 MCG PO TABS
100.0000 ug | ORAL_TABLET | Freq: Every day | ORAL | Status: DC
Start: 2014-10-25 — End: 2014-12-18

## 2014-10-25 MED ORDER — MELOXICAM 15 MG PO TABS: 15.0000 mg | ORAL_TABLET | Freq: Every day | ORAL | Status: DC

## 2014-10-25 MED ORDER — ESCITALOPRAM OXALATE 5 MG PO TABS: 5.0000 mg | ORAL_TABLET | Freq: Every day | ORAL | Status: DC

## 2014-10-25 MED ORDER — ZOLPIDEM TARTRATE 10 MG PO TABS: 10.0000 mg | ORAL_TABLET | Freq: Every evening | ORAL | Status: DC | PRN

## 2014-10-25 NOTE — Patient Instructions (Signed)
Thank you for choosing [facility].  Summary/Instructions:  Please continue to take your medications as prescribed.   Your prescription(s) have been submitted to your pharmacy or been printed and provided for you. Please take as directed and contact our office if you believe you are having problem(s) with the medication(s) or have any questions.  Please stop by the lab on the basement level of the building for your blood work. Your results will be released to MyChart (or called to you) after review, usually within 72 hours after test completion. If any changes need to be made, you will be notified at that same time.  If your symptoms worsen or fail to improve, please contact our office for further instruction, or in case of emergency go directly to the emergency room at the closest medical facility.     

## 2014-10-25 NOTE — Progress Notes (Signed)
Subjective:    Patient ID: Miranda Mathis, female    DOB: 1950-10-02, 64 y.o.   MRN: [id number]  Chief Complaint  Patient presents with  . Medication Refill    would like refill of ambien, escitalopram, levothyroxine, and meloxicam    HPI:  Miranda Mathis is a 64 y.o. female with a PMH of hypothyroidism, fibromyalgia, kidney stones, back pain, and depression who presents today for a follow-up office visit.  1.) Hypothyroidism: Previous TSH in August 2015 was 4.75 indicating fair control of her thyroid. Currently maintained on 88 g of levothyroxine. Takes her medications as prescribed and denies any adverse affects of medication.  Lab Results  Component Value Date   TSH 4.75* 01/29/2014    2.) Depression - depression is currently managed with escitalopram. Takes her medications as prescribed. Denies adverse side effects. Depression is a lot better with the current regimen. Denies any sucidial ideations.    3) Insomnia - takes the Ambien as prescribed and denies any adverse effects of the medications. Currently averaging about 6-8 hours of sleep per night and feeling restful when she wakes up in the morning. Takes the medication on a nightly basis.    Allergies  Allergen Reactions  . Morphine Itching    REACTION: Severe itching    Current Outpatient Prescriptions on File Prior to Visit  Medication Sig Dispense Refill  . acetaminophen (TYLENOL) 500 MG tablet Take 1,000 mg by mouth every 6 (six) hours as needed. pain    . dicyclomine (BENTYL) 10 MG capsule TAKE 1 CAPSULE (10 MG TOTAL) BY MOUTH 3 (THREE) TIMES DAILY BEFORE MEALS. 90 capsule 5  . escitalopram (LEXAPRO) 5 MG tablet Take 1 tablet (5 mg total) by mouth daily. 90 tablet 1  . ketorolac (TORADOL) 10 MG tablet TAKE 1 TABLET BY MOUTH 3 TIMES A DAY FOR KIDNEY STONES    . levothyroxine (SYNTHROID, LEVOTHROID) 88 MCG tablet Take 1 tablet (88 mcg total) by mouth daily. 90 tablet 3  . meloxicam (MOBIC) 15 MG tablet Take 1 tablet  (15 mg total) by mouth daily. 90 tablet 3  . omeprazole (PRILOSEC OTC) 20 MG tablet Take 20 mg by mouth 2 (two) times daily.     [name] oxyCODONE-acetaminophen (ROXICET) 5-325 MG per tablet Take 1 tablet by mouth every 8 (eight) hours as needed for pain. 30 tablet 0  . triamcinolone lotion (KENALOG) 0.1 % Apply 1 application topically 3 (three) times daily. 60 mL 0  . zolpidem (AMBIEN) 10 MG tablet TAKE 1 TABLET BY MOUTH AT BEDTIME AS NEEDED FOR SLEEP 30 tablet 5   No current facility-administered medications on file prior to visit.    Past Medical History  Diagnosis Date  . Headache(784.0)   . Allergic rhinitis, cause unspecified   . Ureteral stricture, right   . Stone, bladder   . IBS (irritable bowel syndrome)   . Barrett's esophagus   . Fibromyalgia   . DDD (degenerative disc disease), lumbar   . Chronic back pain   . GERD (gastroesophageal reflux disease)   . Hypothyroid 11/02/2013 dx    Review of Systems  Respiratory: Negative for chest tightness and shortness of breath.   Cardiovascular: Negative for palpitations.  Endocrine: Negative for cold intolerance and heat intolerance.  Psychiatric/Behavioral: Negative for sleep disturbance and dysphoric mood.      Objective:    BP 132/84 mmHg  Pulse 78  Temp(Src) 98.4 F (36.9 C) (Oral)  Resp 18  Ht 5\' 2"  (1.575 m)  Wt 206 lb 12.8 oz (93.804 kg)  BMI 37.81 kg/m2  SpO2 92% Nursing note and vital signs reviewed.  Physical Exam  Constitutional: She is oriented to person, place, and time. She appears well-developed and well-nourished. No distress.  Cardiovascular: Normal rate, regular rhythm, normal heart sounds and intact distal pulses.   Pulmonary/Chest: Effort normal and breath sounds normal.  Neurological: She is alert and oriented to person, place, and time.  Skin: Skin is warm and dry.  Psychiatric: She has a normal mood and affect. Her behavior is normal. Judgment and thought content normal.       Assessment & Plan:

## 2014-10-25 NOTE — Progress Notes (Signed)
Pre visit review using our clinic review tool, if applicable. No additional management support is needed unless otherwise documented below in the visit note. 

## 2014-10-25 NOTE — Assessment & Plan Note (Signed)
Hypothyroidism with fair control and last TSH of 4.75. Obtain TSH to determine current status. Continue current dosage of levothyroxine pending TSH results.

## 2014-10-25 NOTE — Assessment & Plan Note (Signed)
Depression symptoms are improved with current regimen. Patient denies adverse side effects or suicidal ideations. Continue current dose of Lexapro.

## 2014-10-25 NOTE — Telephone Encounter (Signed)
Please inform the patient that her TSH is 4.58 which still remains slightly elevated, therefore I have sent a new prescription for her thyroid medication for 100 micrograms for her to start. We can recheck her in 6 weeks with blood work.

## 2014-10-25 NOTE — Assessment & Plan Note (Signed)
Insomnia is stable with current regimen of Ambien. Continue current dosage of Ambien.

## 2014-10-26 NOTE — Telephone Encounter (Signed)
Pt aware of results 

## 2014-11-21 ENCOUNTER — Other Ambulatory Visit: Payer: Self-pay | Admitting: Family

## 2014-11-30 ENCOUNTER — Telehealth: Payer: Self-pay

## 2014-11-30 NOTE — Telephone Encounter (Signed)
Pts pharmacy sent a fax requesting for pt to have a refill of Zolpidem Tartrate 10 mg but she is requesting refills of the medications. Please advise

## 2014-11-30 NOTE — Telephone Encounter (Signed)
Medication refilled on 11/26/2014.

## 2014-12-18 ENCOUNTER — Other Ambulatory Visit: Payer: Self-pay | Admitting: Family

## 2014-12-18 NOTE — Telephone Encounter (Signed)
Synthroid rx sent to pharm

## 2014-12-25 ENCOUNTER — Other Ambulatory Visit: Payer: Self-pay | Admitting: Family

## 2015-01-09 ENCOUNTER — Encounter: Payer: Self-pay | Admitting: Internal Medicine

## 2015-01-09 ENCOUNTER — Other Ambulatory Visit (INDEPENDENT_AMBULATORY_CARE_PROVIDER_SITE_OTHER): Payer: 59

## 2015-01-09 ENCOUNTER — Ambulatory Visit (INDEPENDENT_AMBULATORY_CARE_PROVIDER_SITE_OTHER): Payer: 59 | Admitting: Internal Medicine

## 2015-01-09 VITALS — BP 126/70 | HR 61 | Temp 98.1°F | Ht 62.0 in | Wt 206.8 lb

## 2015-01-09 DIAGNOSIS — E039 Hypothyroidism, unspecified: Secondary | ICD-10-CM

## 2015-01-09 DIAGNOSIS — F32A Depression, unspecified: Secondary | ICD-10-CM

## 2015-01-09 DIAGNOSIS — F329 Major depressive disorder, single episode, unspecified: Secondary | ICD-10-CM | POA: Diagnosis not present

## 2015-01-09 DIAGNOSIS — Z114 Encounter for screening for human immunodeficiency virus [HIV]: Secondary | ICD-10-CM

## 2015-01-09 DIAGNOSIS — Z0000 Encounter for general adult medical examination without abnormal findings: Secondary | ICD-10-CM | POA: Diagnosis not present

## 2015-01-09 DIAGNOSIS — Z1159 Encounter for screening for other viral diseases: Secondary | ICD-10-CM

## 2015-01-09 DIAGNOSIS — H9193 Unspecified hearing loss, bilateral: Secondary | ICD-10-CM | POA: Diagnosis not present

## 2015-01-09 LAB — CBC WITH DIFFERENTIAL/PLATELET
Basophils Absolute: 0 10*3/uL (ref 0.0–0.1)
Basophils Relative: 0.5 % (ref 0.0–3.0)
EOS ABS: 0.2 10*3/uL (ref 0.0–0.7)
EOS PCT: 2.9 % (ref 0.0–5.0)
HCT: 44.6 % (ref 36.0–46.0)
Hemoglobin: 15 g/dL (ref 12.0–15.0)
LYMPHS ABS: 1.7 10*3/uL (ref 0.7–4.0)
Lymphocytes Relative: 30.7 % (ref 12.0–46.0)
MCHC: 33.5 g/dL (ref 30.0–36.0)
MCV: 88 fl (ref 78.0–100.0)
Monocytes Absolute: 0.4 10*3/uL (ref 0.1–1.0)
Monocytes Relative: 6.6 % (ref 3.0–12.0)
Neutro Abs: 3.3 10*3/uL (ref 1.4–7.7)
Neutrophils Relative %: 59.3 % (ref 43.0–77.0)
Platelets: 219 10*3/uL (ref 150.0–400.0)
RBC: 5.07 Mil/uL (ref 3.87–5.11)
RDW: 13.9 % (ref 11.5–15.5)
WBC: 5.6 10*3/uL (ref 4.0–10.5)

## 2015-01-09 LAB — URINALYSIS, ROUTINE W REFLEX MICROSCOPIC
Bilirubin Urine: NEGATIVE
HGB URINE DIPSTICK: NEGATIVE
KETONES UR: NEGATIVE
Leukocytes, UA: NEGATIVE
Nitrite: NEGATIVE
RBC / HPF: NONE SEEN (ref 0–?)
TOTAL PROTEIN, URINE-UPE24: NEGATIVE
UROBILINOGEN UA: 0.2 (ref 0.0–1.0)
Urine Glucose: NEGATIVE
WBC, UA: NONE SEEN (ref 0–?)
pH: 6 (ref 5.0–8.0)

## 2015-01-09 LAB — HEPATITIS C ANTIBODY: HCV Ab: NEGATIVE

## 2015-01-09 LAB — LIPID PANEL
CHOLESTEROL: 226 mg/dL — AB (ref 0–200)
HDL: 39.8 mg/dL (ref >39.00)
LDL CALC: 167 mg/dL — AB (ref 0–99)
NonHDL: 186.2
Total CHOL/HDL Ratio: 6
Triglycerides: 98 mg/dL (ref 0.0–149.0)
VLDL: 19.6 mg/dL (ref 0.0–40.0)

## 2015-01-09 LAB — HEPATIC FUNCTION PANEL
ALBUMIN: 4.1 g/dL (ref 3.5–5.2)
ALK PHOS: 88 U/L (ref 39–117)
ALT: 43 U/L — AB (ref 0–35)
AST: 35 U/L (ref 0–37)
Bilirubin, Direct: 0.1 mg/dL (ref 0.0–0.3)
Total Bilirubin: 0.4 mg/dL (ref 0.2–1.2)
Total Protein: 7.2 g/dL (ref 6.0–8.3)

## 2015-01-09 LAB — BASIC METABOLIC PANEL
BUN: 14 mg/dL (ref 6–23)
CHLORIDE: 104 meq/L (ref 96–112)
CO2: 28 mEq/L (ref 19–32)
Calcium: 9.5 mg/dL (ref 8.4–10.5)
Creatinine, Ser: 1.01 mg/dL (ref 0.40–1.20)
GFR: 58.54 mL/min — ABNORMAL LOW (ref >60.00)
Glucose, Bld: 123 mg/dL — ABNORMAL HIGH (ref 70–99)
Potassium: 4.6 mEq/L (ref 3.5–5.1)
Sodium: 139 mEq/L (ref 135–145)

## 2015-01-09 LAB — TSH: TSH: 2.73 u[IU]/mL (ref 0.35–4.50)

## 2015-01-09 LAB — HIV ANTIBODY (ROUTINE TESTING W REFLEX): HIV: NONREACTIVE

## 2015-01-09 MED ORDER — ZOLPIDEM TARTRATE 10 MG PO TABS: 10.0000 mg | ORAL_TABLET | Freq: Every evening | ORAL | Status: DC | PRN

## 2015-01-09 MED ORDER — KETOROLAC TROMETHAMINE 10 MG PO TABS: 10.0000 mg | ORAL_TABLET | Freq: Three times a day (TID) | ORAL | Status: DC | PRN

## 2015-01-09 MED ORDER — TRIAMCINOLONE ACETONIDE 0.1 % EX LOTN: 1.0000 "application " | TOPICAL_LOTION | Freq: Three times a day (TID) | CUTANEOUS | Status: DC

## 2015-01-09 MED ORDER — DICYCLOMINE HCL 10 MG PO CAPS: 10.0000 mg | ORAL_CAPSULE | Freq: Three times a day (TID) | ORAL | Status: DC

## 2015-01-09 MED ORDER — ESCITALOPRAM OXALATE 10 MG PO TABS
10.0000 mg | ORAL_TABLET | Freq: Every day | ORAL | Status: DC
Start: 2015-01-09 — End: 2015-11-03

## 2015-01-09 NOTE — Progress Notes (Signed)
Pre visit review using our clinic review tool, if applicable. No additional management support is needed unless otherwise documented below in the visit note. 

## 2015-01-09 NOTE — Assessment & Plan Note (Signed)
Exacerbated by situational/family stressors -64 yo dtr Miranda Mathis with learning/devlopmental disorder Low-dose Lexapro begun 09/2013 - initially improved symptoms - now increasing fatigue again and low motivation Titrate to 10 mg daily - pt will call if worse or unimproved symptoms to change med if needed No SI/HI

## 2015-01-09 NOTE — Assessment & Plan Note (Signed)
New dx 10/2013 CPX labs started on meds for same - dose increased 10/2014 Recheck TSH now and adjust as needed Lab Results  Component Value Date   TSH 4.58* 10/25/2014

## 2015-01-09 NOTE — Progress Notes (Signed)
Subjective:    Patient ID: Miranda Mathis, female    DOB: Dec 01, 1950, 64 y.o.   MRN: [id number]  HPI  patient is here today for annual physical. Patient feels well in general. Also reviewed chronic medical issues, interval events and current concerns  Past Medical History  Diagnosis Date  . Headache(784.0)   . Allergic rhinitis, cause unspecified   . Ureteral stricture, right   . Stone, bladder   . IBS (irritable bowel syndrome)   . Barrett's esophagus   . Fibromyalgia   . DDD (degenerative disc disease), lumbar   . Chronic back pain   . GERD (gastroesophageal reflux disease)   . Hypothyroid 11/02/2013 dx   Family History  Problem Relation Age of Onset  . COPD Mother   . Emphysema Mother   . Osteoarthritis Mother   . Rheum arthritis Mother   . Prostate cancer Father     w/mets  . Coronary artery disease Paternal Grandfather   . Diabetes Paternal Aunt     x 3  . Colon cancer Neg Hx    History  Substance Use Topics  . Smoking status: Never Smoker   . Smokeless tobacco: Never Used  . Alcohol Use: 0.6 oz/week    1 Standard drinks or equivalent per week     Comment: rarely    Review of Systems  Constitutional: Negative for fever, activity change, appetite change, fatigue and unexpected weight change.  HENT: Positive for hearing loss ("crickets at night").   Respiratory: Negative for cough, shortness of breath and wheezing.   Cardiovascular: Negative for chest pain, palpitations and leg swelling.  Gastrointestinal: Negative for nausea, abdominal pain and diarrhea.  Neurological: Negative for dizziness, weakness, light-headedness and headaches.  Psychiatric/Behavioral: Positive for dysphoric mood and decreased concentration. Negative for suicidal ideas, sleep disturbance and self-injury. The patient is not nervous/anxious.   All other systems reviewed and are negative.      Objective:    Physical Exam  Constitutional: She is oriented to person, place, and time. She  appears well-developed and well-nourished. No distress.  obese  HENT:  Head: Normocephalic and atraumatic.  Right Ear: External ear normal.  Left Ear: External ear normal.  Nose: Nose normal.  Mouth/Throat: Oropharynx is clear and moist. No oropharyngeal exudate.  Eyes: EOM are normal. Pupils are equal, round, and reactive to light. Right eye exhibits no discharge. Left eye exhibits no discharge. No scleral icterus.  Neck: Normal range of motion. Neck supple. No JVD present. No tracheal deviation present. No thyromegaly present.  Cardiovascular: Normal rate, regular rhythm, normal heart sounds and intact distal pulses.  Exam reveals no friction rub.   No murmur heard. Pulmonary/Chest: Effort normal and breath sounds normal. No respiratory distress. She has no wheezes. She has no rales. She exhibits no tenderness.  Abdominal: Soft. Bowel sounds are normal. She exhibits no distension and no mass. There is no tenderness. There is no rebound and no guarding.  Genitourinary:  Defer to gyn  Musculoskeletal: Normal range of motion.  No gross deformities  Lymphadenopathy:    She has no cervical adenopathy.  Neurological: She is alert and oriented to person, place, and time. She has normal reflexes. No cranial nerve deficit.  Skin: Skin is warm and dry. No rash noted. She is not diaphoretic. No erythema.  Psychiatric: She has a normal mood and affect. Her behavior is normal. Judgment and thought content normal.  Nursing note and vitals reviewed.   BP 126/70 mmHg  Pulse 61  Temp(Src) 98.1 F (36.7 C) (Oral)  Ht 5\' 2"  (1.575 m)  Wt 206 lb 12 oz (93.781 kg)  BMI 37.81 kg/m2  SpO2 96% Wt Readings from Last 3 Encounters:  01/09/15 206 lb 12 oz (93.781 kg)  10/25/14 206 lb 12.8 oz (93.804 kg)  11/23/13 203 lb 12.8 oz (92.443 kg)     Lab Results  Component Value Date   WBC 6.5 11/02/2013   HGB 14.9 11/02/2013   HCT 45.3 11/02/2013   PLT 152.0 11/02/2013   GLUCOSE 115* 11/02/2013    CHOL 203* 11/02/2013   TRIG 105.0 11/02/2013   HDL 37.40* 11/02/2013   LDLDIRECT 165.3 11/13/2010   LDLCALC 145* 11/02/2013   ALT 53* 11/02/2013   AST 38* 11/02/2013   NA 139 11/02/2013   K 4.2 11/02/2013   CL 108 11/02/2013   CREATININE 0.9 11/02/2013   BUN 14 11/02/2013   CO2 25 11/02/2013   TSH 4.58* 10/25/2014    No results found.     Assessment & Plan:   CPX/z00.00 - Patient has been counseled on age-appropriate routine health concerns for screening and prevention. These are reviewed and up-to-date. Immunizations are up-to-date or declined. Labs ordered and reviewed.  Problem List Items Addressed This Visit    Depression    Exacerbated by situational/family stressors -64 yo dtr Brandie with learning/devlopmental disorder Low-dose Lexapro begun 09/2013 - initially improved symptoms - now increasing fatigue again and low motivation Titrate to 10 mg daily - pt will call if worse or unimproved symptoms to change med if needed No SI/HI      Relevant Medications   escitalopram (LEXAPRO) 10 MG tablet   Hypothyroid    New dx 10/2013 CPX labs started on meds for same - dose increased 10/2014 Recheck TSH now and adjust as needed Lab Results  Component Value Date   TSH 4.58* 10/25/2014         Other Visit Diagnoses    Routine general medical examination at a health care facility    -  Primary    Relevant Orders    Basic metabolic panel    CBC with Differential/Platelet    Hepatic function panel    Lipid panel    TSH    Urinalysis, Routine w reflex microscopic (not at [facility])    Hearing loss, bilateral        Relevant Orders    Ambulatory referral to Audiology    Need for hepatitis C screening test        Relevant Orders    Hepatitis C antibody    Screening for HIV without presence of risk factors        Relevant Orders    HIV antibody        Gwendolyn Grant, MD

## 2015-01-09 NOTE — Patient Instructions (Signed)
It was good to see you today.  We have reviewed your prior records including labs and tests today  Health Maintenance reviewed - check on colonoscopy coverage and shingles vaccine coverage by your current and future insurer. All other recommended immunizations and age-appropriate screenings are up-to-date.  Test(s) ordered today. Your results will be released to [place] (or called to you) after review, usually within 72hours after test completion. If any changes need to be made, you will be notified at that same time.  Medications reviewed and updated Increase Lexapro to 10 mg daily for depression symptoms. No other changes recommended at this time.  Your prescription(s) have been submitted to your pharmacy. Please take as directed and contact our office if you believe you are having problem(s) with the medication(s).  If depression symptoms worse, or if unimproved on increased dose of medication, please contact our office for further change  We'll make referral to audiology for hearing test. My office will call with this appointment once arranged  Please schedule followup in 12 months for annual exam and labs, call sooner if problems.

## 2015-01-17 ENCOUNTER — Other Ambulatory Visit: Payer: Self-pay

## 2015-01-17 MED ORDER — PRAVASTATIN SODIUM 20 MG PO TABS: 20.0000 mg | ORAL_TABLET | Freq: Every day | ORAL | Status: DC

## 2015-04-14 ENCOUNTER — Other Ambulatory Visit: Payer: Self-pay | Admitting: Family

## 2015-05-13 ENCOUNTER — Other Ambulatory Visit: Payer: Self-pay | Admitting: Family

## 2015-05-30 ENCOUNTER — Other Ambulatory Visit: Payer: Self-pay | Admitting: Internal Medicine

## 2015-07-24 ENCOUNTER — Other Ambulatory Visit: Payer: Self-pay | Admitting: Internal Medicine

## 2015-07-26 ENCOUNTER — Telehealth: Payer: Self-pay | Admitting: Internal Medicine
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
## 2015-07-29 MED ORDER — ZOLPIDEM TARTRATE 10 MG PO TABS: 10.0000 mg | ORAL_TABLET | Freq: Every evening | ORAL | Status: DC | PRN

## 2015-07-29 NOTE — Telephone Encounter (Signed)
Please advise, thanks.

## 2015-07-29 NOTE — Telephone Encounter (Signed)
rx for [facility]

## 2015-07-29 NOTE — Telephone Encounter (Signed)
rx printed

## 2015-08-29 DIAGNOSIS — K209 Esophagitis, unspecified without bleeding: Secondary | ICD-10-CM | POA: Insufficient documentation

## 2015-08-29 DIAGNOSIS — M47816 Spondylosis without myelopathy or radiculopathy, lumbar region: Secondary | ICD-10-CM | POA: Insufficient documentation

## 2015-08-29 DIAGNOSIS — M4727 Other spondylosis with radiculopathy, lumbosacral region: Secondary | ICD-10-CM | POA: Insufficient documentation

## 2015-08-29 DIAGNOSIS — K219 Gastro-esophageal reflux disease without esophagitis: Secondary | ICD-10-CM | POA: Insufficient documentation

## 2015-08-29 DIAGNOSIS — K2270 Barrett's esophagus without dysplasia: Secondary | ICD-10-CM | POA: Insufficient documentation

## 2015-08-29 DIAGNOSIS — N200 Calculus of kidney: Secondary | ICD-10-CM | POA: Insufficient documentation

## 2015-08-29 DIAGNOSIS — F411 Generalized anxiety disorder: Secondary | ICD-10-CM | POA: Insufficient documentation

## 2015-08-29 DIAGNOSIS — K581 Irritable bowel syndrome with constipation: Secondary | ICD-10-CM | POA: Insufficient documentation

## 2015-08-29 DIAGNOSIS — Z8719 Personal history of other diseases of the digestive system: Secondary | ICD-10-CM | POA: Insufficient documentation

## 2015-11-03 ENCOUNTER — Emergency Department (HOSPITAL_COMMUNITY): Payer: Medicare Other

## 2015-11-03 ENCOUNTER — Encounter (HOSPITAL_COMMUNITY): Payer: Self-pay | Admitting: Emergency Medicine

## 2015-11-03 ENCOUNTER — Emergency Department (HOSPITAL_COMMUNITY)
Admission: EM | Admit: 2015-11-03 | Discharge: 2015-11-03 | Disposition: A | Discharge status: Home / Self Care | Payer: Medicare Other | Attending: Emergency Medicine | Admitting: Emergency Medicine

## 2015-11-03 DIAGNOSIS — M797 Fibromyalgia: Secondary | ICD-10-CM | POA: Insufficient documentation

## 2015-11-03 DIAGNOSIS — R1013 Epigastric pain: Secondary | ICD-10-CM | POA: Diagnosis present

## 2015-11-03 DIAGNOSIS — R109 Unspecified abdominal pain: Secondary | ICD-10-CM

## 2015-11-03 DIAGNOSIS — G8929 Other chronic pain: Secondary | ICD-10-CM | POA: Insufficient documentation

## 2015-11-03 DIAGNOSIS — R61 Generalized hyperhidrosis: Secondary | ICD-10-CM | POA: Diagnosis not present

## 2015-11-03 DIAGNOSIS — K589 Irritable bowel syndrome without diarrhea: Secondary | ICD-10-CM | POA: Diagnosis not present

## 2015-11-03 DIAGNOSIS — Z79899 Other long term (current) drug therapy: Secondary | ICD-10-CM | POA: Insufficient documentation

## 2015-11-03 DIAGNOSIS — Z7952 Long term (current) use of systemic steroids: Secondary | ICD-10-CM | POA: Insufficient documentation

## 2015-11-03 DIAGNOSIS — Z87442 Personal history of urinary calculi: Secondary | ICD-10-CM | POA: Insufficient documentation

## 2015-11-03 DIAGNOSIS — K2270 Barrett's esophagus without dysplasia: Secondary | ICD-10-CM | POA: Insufficient documentation

## 2015-11-03 DIAGNOSIS — E039 Hypothyroidism, unspecified: Secondary | ICD-10-CM | POA: Insufficient documentation

## 2015-11-03 DIAGNOSIS — K219 Gastro-esophageal reflux disease without esophagitis: Secondary | ICD-10-CM | POA: Diagnosis not present

## 2015-11-03 DIAGNOSIS — Z791 Long term (current) use of non-steroidal anti-inflammatories (NSAID): Secondary | ICD-10-CM | POA: Insufficient documentation

## 2015-11-03 DIAGNOSIS — N1330 Unspecified hydronephrosis: Secondary | ICD-10-CM | POA: Diagnosis not present

## 2015-11-03 DIAGNOSIS — Z9049 Acquired absence of other specified parts of digestive tract: Secondary | ICD-10-CM | POA: Diagnosis not present

## 2015-11-03 DIAGNOSIS — R079 Chest pain, unspecified: Secondary | ICD-10-CM | POA: Diagnosis not present

## 2015-11-03 LAB — COMPREHENSIVE METABOLIC PANEL
ALT: 29 U/L (ref 14–54)
AST: 34 U/L (ref 15–41)
Albumin: 4.1 g/dL (ref 3.5–5.0)
Alkaline Phosphatase: 65 U/L (ref 38–126)
Anion gap: 6 (ref 5–15)
BILIRUBIN TOTAL: 0.8 mg/dL (ref 0.3–1.2)
BUN: 19 mg/dL (ref 6–20)
CALCIUM: 8.9 mg/dL (ref 8.9–10.3)
CO2: 27 mmol/L (ref 22–32)
Chloride: 105 mmol/L (ref 101–111)
Creatinine, Ser: 0.91 mg/dL (ref 0.44–1.00)
GFR calc Af Amer: >60 mL/min (ref >60)
Glucose, Bld: 132 mg/dL — ABNORMAL HIGH (ref 65–99)
POTASSIUM: 4.8 mmol/L (ref 3.5–5.1)
Sodium: 138 mmol/L (ref 135–145)
TOTAL PROTEIN: 7.4 g/dL (ref 6.5–8.1)

## 2015-11-03 LAB — URINALYSIS, ROUTINE W REFLEX MICROSCOPIC
BILIRUBIN URINE: NEGATIVE
GLUCOSE, UA: NEGATIVE mg/dL
KETONES UR: NEGATIVE mg/dL
LEUKOCYTES UA: NEGATIVE
Nitrite: NEGATIVE
PH: 5 (ref 5.0–8.0)
Protein, ur: 100 mg/dL — AB
Specific Gravity, Urine: 1.029 (ref 1.005–1.030)

## 2015-11-03 LAB — URINE MICROSCOPIC-ADD ON

## 2015-11-03 LAB — CBC
HCT: 41.5 % (ref 36.0–46.0)
Hemoglobin: 13.5 g/dL (ref 12.0–15.0)
MCH: 29.3 pg (ref 26.0–34.0)
MCHC: 32.5 g/dL (ref 30.0–36.0)
MCV: 90.2 fL (ref 78.0–100.0)
PLATELETS: 190 10*3/uL (ref 150–400)
RBC: 4.6 MIL/uL (ref 3.87–5.11)
RDW: 13.9 % (ref 11.5–15.5)
WBC: 7.6 10*3/uL (ref 4.0–10.5)

## 2015-11-03 LAB — LIPASE, BLOOD: LIPASE: 23 U/L (ref 11–51)

## 2015-11-03 LAB — I-STAT TROPONIN, ED
TROPONIN I, POC: 0 ng/mL (ref 0.00–0.08)
Troponin i, poc: 0 ng/mL (ref 0.00–0.08)

## 2015-11-03 MED ORDER — HYDROCODONE-ACETAMINOPHEN 5-325 MG PO TABS: 1.0000 | ORAL_TABLET | Freq: Four times a day (QID) | ORAL | Status: DC | PRN

## 2015-11-03 MED ORDER — GI COCKTAIL ~~LOC~~
30.0000 mL | Freq: Once | ORAL | Status: AC
  Administered 2015-11-03: 30 mL via ORAL
  Filled 2015-11-03: qty 30

## 2015-11-03 MED ORDER — KETOROLAC TROMETHAMINE 15 MG/ML IJ SOLN
15.0000 mg | Freq: Once | INTRAMUSCULAR | Status: AC
  Administered 2015-11-03: 15 mg via INTRAVENOUS
  Filled 2015-11-03: qty 1

## 2015-11-03 MED ORDER — DICYCLOMINE HCL 10 MG PO CAPS
10.0000 mg | ORAL_CAPSULE | Freq: Once | ORAL | Status: AC
  Administered 2015-11-03: 10 mg via ORAL
  Filled 2015-11-03: qty 1

## 2015-11-03 MED ORDER — FENTANYL CITRATE (PF) 100 MCG/2ML IJ SOLN
50.0000 ug | Freq: Once | INTRAMUSCULAR | Status: AC
  Administered 2015-11-03: 50 ug via INTRAVENOUS
  Filled 2015-11-03: qty 2

## 2015-11-03 NOTE — ED Notes (Signed)
Pt from home with complaints of right flank pain without urinary symptoms. Pt has a hx of kidney stones. Pt states this pain is similar to when she has kidney stones. Pt also has complaints of central chest pain with nausea, but no vomiting. Pt is diaphoretic. Pt states pain came on gradually not suddenly. Pt states pain is recurrent, but has been steady since 1600 today.

## 2015-11-03 NOTE — Discharge Instructions (Signed)
Flank Pain °Flank pain refers to pain that is located on the side of the body between the upper abdomen and the back. The pain may occur over a short period of time (acute) or may be long-term or reoccurring (chronic). It may be mild or severe. Flank pain can be caused by many things. °CAUSES  °Some of the more common causes of flank pain include: °· Muscle strains.   °· Muscle spasms.   °· A disease of your spine (vertebral disk disease).   °· A lung infection (pneumonia).   °· Fluid around your lungs (pulmonary edema).   °· A kidney infection.   °· Kidney stones.   °· A very painful skin rash caused by the chickenpox virus (shingles).   °· Gallbladder disease.   °HOME CARE INSTRUCTIONS  °Home care will depend on the cause of your pain. In general, °· Rest as directed by your caregiver. °· Drink enough fluids to keep your urine clear or pale yellow. °· Only take over-the-counter or prescription medicines as directed by your caregiver. Some medicines may help relieve the pain. °· Tell your caregiver about any changes in your pain. °· Follow up with your caregiver as directed. °SEEK IMMEDIATE MEDICAL CARE IF:  °· Your pain is not controlled with medicine.   °· You have new or worsening symptoms. °· Your pain increases.   °· You have abdominal pain.   °· You have shortness of breath.   °· You have persistent nausea or vomiting.   °· You have swelling in your abdomen.   °· You feel faint or pass out.   °· You have blood in your urine. °· You have a fever or persistent symptoms for more than 2-3 days. °· You have a fever and your symptoms suddenly get worse. °MAKE SURE YOU:  °· Understand these instructions. °· Will watch your condition. °· Will get help right away if you are not doing well or get worse. °  °This information is not intended to replace advice given to you by your health care provider. Make sure you discuss any questions you have with your health care provider. °  °Document Released: 07/30/2005 Document  Revised: 03/02/2012 Document Reviewed: 01/21/2012 °Elsevier Interactive Patient Education ©2016 [employer] ° °Hydronephrosis °Hydronephrosis is the enlargement of a kidney due to a blockage that stops urine from flowing out of the body. °CAUSES °Common causes of this condition include: °· A birth (congenital) defect of the kidney. °· A congenital defect of the tube through which urine travels (ureter). °· Kidney stones. °· An enlarged prostate gland. °· A tumor. °· Cancer of the prostate, bladder, uterus, ovary, or colon. °· A blood clot. °SYMPTOMS °Symptoms of this condition include: °· Pain or discomfort in your side (flank). °· Swelling of the abdomen. °· Pain in the abdomen. °· Nausea and vomiting. °· Fever. °· Pain while passing urine. °· Feeling of urgency to urinate. °· Frequent urination. °· Infection of the urinary tract. °In some cases, there are no symptoms. °DIAGNOSIS °This condition may be diagnosed with: °· A medical history. °· A physical exam. °· Blood and urine tests to check kidney function. °· Imaging tests, such as an X-ray, ultrasound, CT scan, or MRI. °· A test in which a rigid or flexible telescope (cystoscope) is used to view the site of the blockage. °TREATMENT °Treatment for this condition depends on where the blockage is located, how long it has been there, and what caused it. The goal of treatment is to remove the blockage. Treatment options include: °· A procedure to put in a soft tube   to help drain urine. °· Antibiotic medicines to treat or prevent infection. °· Shock-wave therapy (lithotripsy) to help eliminate kidney stones. °HOME CARE INSTRUCTIONS °· Get lots of rest. °· Drink enough fluid to keep your urine clear or pale yellow. °· If you have a drain in, follow your health care provider's instructions about how to care for it. °· Take medicines only as directed by your health care provider. °· If you were prescribed an antibiotic medicine, finish all of it even if you start to  feel better. °· Keep all follow-up visits as directed by your health care provider. This is important. °SEEK MEDICAL CARE IF: °· You continue to have symptoms after treatment. °· You develop new symptoms. °· You have a problem with a drainage device. °· Your urine becomes cloudy or bloody. °· You have a fever. °SEEK IMMEDIATE MEDICAL CARE IF: °· You have severe flank or abdominal pain. °· You develop vomiting and are unable to keep fluids down. °  °This information is not intended to replace advice given to you by your health care provider. Make sure you discuss any questions you have with your health care provider. °  °Document Released: 04/05/2007 Document Revised: 10/23/2014 Document Reviewed: 06/04/2014 °Elsevier Interactive Patient Education ©2016 [employer] ° °

## 2015-11-03 NOTE — ED Notes (Signed)
Pt requesting medications for bladder spasms. Dr.Ree notified.

## 2015-11-03 NOTE — ED Notes (Signed)
MD at bedside. 

## 2015-11-03 NOTE — ED Notes (Signed)
Family at bedside. 

## 2015-11-03 NOTE — ED Provider Notes (Signed)
CSN: [id number]     Arrival date & time 11/03/15  1802 History   First MD Initiated Contact with Patient 11/03/15 1816     Chief Complaint  Patient presents with  . Flank Pain  . Chest Pain     Patient is a 65 y.o. female presenting with flank pain and chest pain. The history is provided by the patient. No language interpreter was used.  Flank Pain Associated symptoms include chest pain.  Chest Pain  Miranda Mathis is a 65 y.o. female who presents to the Emergency Department complaining of flank pain, epigastric pain.  She reports right flank pain radiating into the right lower quadrant that has been ongoing since yesterday. She also has intermittent epigastric pain for the last few days that she attributed to reflux. Around 4 this afternoon she had worsening constant epigastric pain with diaphoresis, nausea. The right flank pain feels like her typical kidney stone pain but the epigastric pain is new for her. Symptoms are severe, constant and worsening. No fevers, vomiting. She denies cough, shortness of breath, chest pain, lower extremity pain, vomiting.  Past Medical History  Diagnosis Date  . Headache(784.0)   . Allergic rhinitis, cause unspecified   . Ureteral stricture, right   . Stone, bladder   . IBS (irritable bowel syndrome)   . Barrett's esophagus   . Fibromyalgia   . DDD (degenerative disc disease), lumbar   . Chronic back pain   . GERD (gastroesophageal reflux disease)   . Hypothyroid 11/02/2013 dx   Past Surgical History  Procedure Laterality Date  . Cesarean section  X2  . Cysto/ bilateral ureteroscopy / ureteral bx's/ bilateral ureteral stent placement/ bladder stone extraction  03-13-2011  . Right ureteroscopic / ureteral bx/ stent placement  04-17-2011  . Right foot surg  2003    HEEL  . Lumbar microdiscectomy  1990'S    L5 - S1  . Abdominal hysterectomy  1987  . Cystoscopy w/ retrogrades  07/10/2011    Procedure: CYSTOSCOPY WITH RETROGRADE PYELOGRAM;  Surgeon:  Fredricka Bonine, MD;  Location: [facility];  Service: Urology;  Laterality: Right;  . Balloon dilation  07/10/2011    Procedure: BALLOON DILATION;  Surgeon: Fredricka Bonine, MD;  Location: [facility];  Service: Urology;  Laterality: Right;  . Cystoscopy w/ ureteral stent removal  07/10/2011    Procedure: CYSTOSCOPY WITH STENT REMOVAL;  Surgeon: Fredricka Bonine, MD;  Location: [facility];  Service: Urology;  Laterality: Right;  . Cystoscopy w/ ureteral stent placement  07/10/2011    Procedure: CYSTOSCOPY WITH STENT REPLACEMENT;  Surgeon: Fredricka Bonine, MD;  Location: [facility];  Service: Urology;  Laterality: Right;   Family History  Problem Relation Age of Onset  . COPD Mother   . Emphysema Mother   . Osteoarthritis Mother   . Rheum arthritis Mother   . Prostate cancer Father     w/mets  . Coronary artery disease Paternal Grandfather   . Diabetes Paternal Aunt     x 3  . Colon cancer Neg Hx    Social History  Substance Use Topics  . Smoking status: Never Smoker   . Smokeless tobacco: Never Used  . Alcohol Use: 0.6 oz/week    1 Standard drinks or equivalent per week     Comment: rarely   OB History    No data available     Review of Systems  Cardiovascular: Positive for chest pain.  Genitourinary: Positive for flank pain.  All other systems reviewed and are negative.     Allergies  Morphine  Home Medications   Prior to Admission medications   Medication Sig Start Date End Date Taking? Authorizing Provider  acetaminophen (TYLENOL) 500 MG tablet Take 1,000 mg by mouth every 6 (six) hours as needed. pain   Yes Historical Provider, MD  dicyclomine (BENTYL) 10 MG capsule Take 1 capsule (10 mg total) by mouth 3 (three) times daily before meals. Patient taking differently: Take 20-30 mg by mouth at bedtime.  01/09/15  Yes Rowe Clack, MD  escitalopram (LEXAPRO) 20 MG tablet  Take 20 mg by mouth daily.   Yes Historical Provider, MD  ketorolac (TORADOL) 10 MG tablet Take 1 tablet (10 mg total) by mouth every 8 (eight) hours as needed for severe pain (kidney stone). 01/09/15  Yes Rowe Clack, MD  levothyroxine (SYNTHROID, LEVOTHROID) 100 MCG tablet TAKE 1 TABLET BY MOUTH EVERY DAY 05/13/15  Yes Rowe Clack, MD  meloxicam (MOBIC) 15 MG tablet TAKE 1 TABLET BY MOUTH EVERY DAY 05/13/15  Yes Rowe Clack, MD  omeprazole (PRILOSEC OTC) 20 MG tablet Take 20 mg by mouth 2 (two) times daily as needed (acid reflux).    Yes Historical Provider, MD  oxyCODONE-acetaminophen (ROXICET) 5-325 MG per tablet Take 1 tablet by mouth every 8 (eight) hours as needed for pain. 04/04/12  Yes Neena Rhymes, MD  phenazopyridine (PYRIDIUM) 95 MG tablet Take 95 mg by mouth 3 (three) times daily as needed for pain.   Yes Historical Provider, MD  pravastatin (PRAVACHOL) 20 MG tablet Take 1 tablet (20 mg total) by mouth daily. 01/17/15  Yes Rowe Clack, MD  triamcinolone lotion (KENALOG) 0.1 % Apply topically 3 (three) times daily. Must establish with NEW PCP for additional refills. 05/30/15  Yes Rowe Clack, MD  zolpidem (AMBIEN) 10 MG tablet Take 1 tablet (10 mg total) by mouth at bedtime as needed. for sleep 07/29/15  Yes Binnie Rail, MD  HYDROcodone-acetaminophen (NORCO/VICODIN) 5-325 MG tablet Take 1 tablet by mouth every 6 (six) hours as needed. 11/03/15   Quintella Reichert, MD   BP 125/61 mmHg  Pulse 56  Temp(Src) 98.3 F (36.8 C) (Oral)  Resp 17  Ht 5\' 2"  (1.575 m)  Wt 206 lb (93.441 kg)  BMI 37.67 kg/m2  SpO2 94% Physical Exam  Constitutional: She is oriented to person, place, and time. She appears well-developed and well-nourished. She appears distressed.  HENT:  Head: Normocephalic and atraumatic.  Cardiovascular: Normal rate and regular rhythm.   No murmur heard. Pulmonary/Chest: Effort normal and breath sounds normal. No respiratory distress.   Abdominal: Soft. There is no rebound and no guarding.  Moderate epigastric tenderness, moderate right CVA tenderness  Musculoskeletal: She exhibits no edema or tenderness.  2+ DP pulses in bilateral lower extremities  Neurological: She is alert and oriented to person, place, and time.  Skin: Skin is warm. She is diaphoretic.  Psychiatric: She has a normal mood and affect. Her behavior is normal.  Nursing note and vitals reviewed.   ED Course  Procedures (including critical care time) Labs Review Labs Reviewed  URINALYSIS, ROUTINE W REFLEX MICROSCOPIC (NOT AT [facility]) - Abnormal; Notable for the following:    APPearance CLOUDY (*)    Hgb urine dipstick TRACE (*)    Protein, ur 100 (*)    All other components within normal limits  COMPREHENSIVE METABOLIC PANEL - Abnormal; Notable for the  following:    Glucose, Bld 132 (*)    All other components within normal limits  URINE MICROSCOPIC-ADD ON - Abnormal; Notable for the following:    Squamous Epithelial / LPF 0-5 (*)    Bacteria, UA FEW (*)    All other components within normal limits  CBC  LIPASE, BLOOD  I-STAT TROPOININ, ED  Randolm Idol, ED    Imaging Review Dg Chest Port 1 View  11/03/2015  CLINICAL DATA:  Chest pain EXAM: PORTABLE CHEST 1 VIEW COMPARISON:  03/08/2011 FINDINGS: Cardiac shadow is mildly enlarged but accentuated by the portable technique. The lungs are clear. The bony structures are within normal limits. No soft tissue abnormality is seen. IMPRESSION: No acute abnormality noted. Electronically Signed   By: Inez Catalina M.D.   On: 11/03/2015 19:35   Ct Renal Stone Study  11/03/2015  CLINICAL DATA:  Pt from home with complaints of right flank pain without urinary symptoms. Pt has a hx of kidney stones. Pt states this pain is similar to when she has kidney stones. Pt also has complaints of central chest pain with nausea. EXAM: CT ABDOMEN AND PELVIS WITHOUT CONTRAST TECHNIQUE: Multidetector CT imaging of the  abdomen and pelvis was performed following the standard protocol without IV contrast. COMPARISON:  02/20/2011 FINDINGS: Lower chest: There is subsegmental atelectasis at the right lung base. Heart size is normal. No imaged pericardial effusion or significant coronary artery calcifications. Upper abdomen: No focal abnormality identified within the liver, spleen, pancreas, or adrenal glands. The gallbladder surgically absent. There is moderate right hydronephrosis and dilatation of the proximal right ureter. There is smooth tapering to normal caliber ureter in the proximal third without evidence for stone or mass as a cause of obstruction. There is similar bilateral perinephric stranding. The left kidney has a normal appearance. Left ureter is normal in caliber. No bladder or urethral stones are identified. Gastrointestinal tract: The stomach and small bowel loops have a normal appearance. The appendix is well seen and has a normal appearance. Colonic loops are normal in appearance. Pelvis: The uterus is surgically absent. No free pelvic fluid. 1.7 cm left ovarian lesion possibly a cyst, increased in size. Retroperitoneum: Minimal atherosclerotic calcification of the abdominal aorta. No aneurysm. Abdominal wall: Unremarkable. Osseous structures: Unremarkable. IMPRESSION: 1. Right-sided hydronephrosis. The chronicity of this process is not known since prior imaging studies reveal similar degree of dilatation of the renal pelvis. No stones are identified as a source of obstruction. The cause of dilatation could be a chronic or acute stricture or a recently passed stone. 2. Status post cholecystectomy. 3. Left ovarian lesion warrants follow-up. Outpatient pelvic ultrasound is recommended for characterization. Electronically Signed   By: Nolon Nations M.D.   On: 11/03/2015 20:02   I have personally reviewed and evaluated these images and lab results as part of my medical decision-making.   EKG  Interpretation   Date/Time:  Sunday Nov 03 2015 18:24:58 EDT Ventricular Rate:  68 PR Interval:  195 QRS Duration: 89 QT Interval:  453 QTC Calculation: 482 R Axis:   39 Text Interpretation:  Sinus rhythm Confirmed by Hazle Coca [id number]) on  11/03/2015 6:38:58 PM      MDM   Final diagnoses:  Acute right flank pain  Hydronephrosis, unspecified hydronephrosis type  Epigastric pain   Patient here for evaluation of right flank pain as well as epigastric pain. In terms of her flank pain this is similar to her prior episodes for no colic. CT scan demonstrates  right-sided hydronephrosis but no definite stone. Her pain did improve in the emergency department following Toradol administration. BMP with normal renal function. UA not consistent with UTI. We'll treat her pain from the hydronephrosis was close urology follow-up for further evaluation. In terms of her epigastric pain, she has epigastric tenderness on examination, pain resolved with GI cocktail. Presentation is not consistent with ACS, PE, dissection. Discussed with patient care for epigastric pain and importance of PCP follow-up. Discussed close return precautions.    Quintella Reichert, MD 11/04/15 [phone]

## 2015-11-13 ENCOUNTER — Other Ambulatory Visit: Payer: Self-pay | Admitting: Urology

## 2015-11-13 DIAGNOSIS — N1330 Unspecified hydronephrosis: Secondary | ICD-10-CM

## 2015-11-15 ENCOUNTER — Ambulatory Visit (HOSPITAL_COMMUNITY)
Admission: RE | Admit: 2015-11-15 | Discharge: 2015-11-15 | Disposition: A | Discharge status: Home / Self Care | Payer: Medicare Other | Source: Ambulatory Visit | Attending: Urology | Admitting: Urology

## 2015-11-15 DIAGNOSIS — N1330 Unspecified hydronephrosis: Secondary | ICD-10-CM

## 2015-11-15 MED ORDER — FUROSEMIDE 10 MG/ML IJ SOLN
INTRAMUSCULAR | Status: AC
  Filled 2015-11-15: qty 8

## 2015-11-15 MED ORDER — FUROSEMIDE 10 MG/ML IJ SOLN
40.0000 mg | Freq: Once | INTRAMUSCULAR | Status: AC
  Administered 2015-11-15: 47 mg via INTRAVENOUS

## 2015-11-15 MED ORDER — TECHNETIUM TC 99M MERTIATIDE
15.3000 | Freq: Once | INTRAVENOUS | Status: AC | PRN
  Administered 2015-11-15: 15 via INTRAVENOUS

## 2015-11-29 ENCOUNTER — Other Ambulatory Visit: Payer: Self-pay | Admitting: Family Medicine

## 2015-11-29 DIAGNOSIS — E2839 Other primary ovarian failure: Secondary | ICD-10-CM

## 2015-11-29 DIAGNOSIS — Z1231 Encounter for screening mammogram for malignant neoplasm of breast: Secondary | ICD-10-CM

## 2015-12-23 ENCOUNTER — Ambulatory Visit
Admission: RE | Admit: 2015-12-23 | Discharge: 2015-12-23 | Disposition: A | Discharge status: Home / Self Care | Payer: No Typology Code available for payment source | Source: Ambulatory Visit | Attending: Family Medicine | Admitting: Family Medicine

## 2015-12-23 ENCOUNTER — Ambulatory Visit
Admission: RE | Admit: 2015-12-23 | Discharge: 2015-12-23 | Disposition: A | Discharge status: Home / Self Care | Payer: Medicare Other | Source: Ambulatory Visit | Attending: Family Medicine | Admitting: Family Medicine

## 2015-12-23 DIAGNOSIS — Z1231 Encounter for screening mammogram for malignant neoplasm of breast: Secondary | ICD-10-CM

## 2015-12-23 DIAGNOSIS — E2839 Other primary ovarian failure: Secondary | ICD-10-CM

## 2015-12-29 ENCOUNTER — Other Ambulatory Visit: Payer: Self-pay | Admitting: Internal Medicine

## 2015-12-30 ENCOUNTER — Other Ambulatory Visit: Payer: Self-pay | Admitting: Family

## 2016-01-13 ENCOUNTER — Encounter: Payer: 59 | Admitting: Internal Medicine

## 2016-01-13 ENCOUNTER — Encounter: Payer: Self-pay | Admitting: Internal Medicine

## 2016-01-26 ENCOUNTER — Other Ambulatory Visit: Payer: Self-pay | Admitting: Family

## 2016-01-27 ENCOUNTER — Other Ambulatory Visit: Payer: Self-pay | Admitting: Internal Medicine

## 2016-01-27 ENCOUNTER — Other Ambulatory Visit: Payer: Self-pay | Admitting: Family

## 2016-03-10 ENCOUNTER — Other Ambulatory Visit: Payer: Self-pay | Admitting: Internal Medicine

## 2016-03-17 ENCOUNTER — Other Ambulatory Visit: Payer: Self-pay | Admitting: Urology

## 2016-03-17 DIAGNOSIS — N131 Hydronephrosis with ureteral stricture, not elsewhere classified: Secondary | ICD-10-CM

## 2016-03-23 ENCOUNTER — Encounter (HOSPITAL_COMMUNITY)
Admission: RE | Admit: 2016-03-23 | Discharge: 2016-03-23 | Disposition: A | Discharge status: Home / Self Care | Payer: Medicare Other | Source: Ambulatory Visit | Attending: Urology | Admitting: Urology

## 2016-03-23 DIAGNOSIS — N131 Hydronephrosis with ureteral stricture, not elsewhere classified: Secondary | ICD-10-CM | POA: Diagnosis present

## 2016-03-23 MED ORDER — FUROSEMIDE 10 MG/ML IJ SOLN: 46.0000 mg | Freq: Once | INTRAMUSCULAR | Status: DC

## 2016-03-23 MED ORDER — TECHNETIUM TC 99M MERTIATIDE
4.8800 | Freq: Once | INTRAVENOUS | Status: AC | PRN
Start: 2016-03-23 — End: 2016-03-23
  Administered 2016-03-23: 4.88 via INTRAVENOUS

## 2016-03-23 MED ORDER — FUROSEMIDE 10 MG/ML IJ SOLN
INTRAMUSCULAR | Status: AC
  Filled 2016-03-23: qty 8

## 2016-06-22 HISTORY — PX: UPPER GASTROINTESTINAL ENDOSCOPY: SHX188

## 2016-06-22 HISTORY — PX: COLONOSCOPY: SHX174

## 2016-07-28 DIAGNOSIS — E039 Hypothyroidism, unspecified: Secondary | ICD-10-CM | POA: Diagnosis not present

## 2016-08-13 DIAGNOSIS — H2513 Age-related nuclear cataract, bilateral: Secondary | ICD-10-CM | POA: Diagnosis not present

## 2016-08-14 DIAGNOSIS — N390 Urinary tract infection, site not specified: Secondary | ICD-10-CM | POA: Diagnosis not present

## 2016-08-14 DIAGNOSIS — R109 Unspecified abdominal pain: Secondary | ICD-10-CM | POA: Diagnosis not present

## 2016-08-27 DIAGNOSIS — R1031 Right lower quadrant pain: Secondary | ICD-10-CM | POA: Diagnosis not present

## 2016-08-27 DIAGNOSIS — E039 Hypothyroidism, unspecified: Secondary | ICD-10-CM | POA: Diagnosis not present

## 2016-08-27 DIAGNOSIS — N839 Noninflammatory disorder of ovary, fallopian tube and broad ligament, unspecified: Secondary | ICD-10-CM | POA: Diagnosis not present

## 2016-08-27 DIAGNOSIS — R7309 Other abnormal glucose: Secondary | ICD-10-CM | POA: Diagnosis not present

## 2016-08-27 DIAGNOSIS — N2889 Other specified disorders of kidney and ureter: Secondary | ICD-10-CM | POA: Diagnosis not present

## 2016-08-27 DIAGNOSIS — K760 Fatty (change of) liver, not elsewhere classified: Secondary | ICD-10-CM | POA: Diagnosis not present

## 2016-09-02 DIAGNOSIS — E1142 Type 2 diabetes mellitus with diabetic polyneuropathy: Secondary | ICD-10-CM | POA: Insufficient documentation

## 2016-09-02 DIAGNOSIS — E119 Type 2 diabetes mellitus without complications: Secondary | ICD-10-CM | POA: Diagnosis not present

## 2016-09-02 DIAGNOSIS — T3695XA Adverse effect of unspecified systemic antibiotic, initial encounter: Secondary | ICD-10-CM | POA: Diagnosis not present

## 2016-09-02 DIAGNOSIS — K219 Gastro-esophageal reflux disease without esophagitis: Secondary | ICD-10-CM | POA: Diagnosis not present

## 2016-09-02 DIAGNOSIS — M797 Fibromyalgia: Secondary | ICD-10-CM | POA: Diagnosis not present

## 2016-11-27 DIAGNOSIS — R3129 Other microscopic hematuria: Secondary | ICD-10-CM | POA: Diagnosis not present

## 2016-11-27 DIAGNOSIS — R301 Vesical tenesmus: Secondary | ICD-10-CM | POA: Diagnosis not present

## 2016-12-21 DIAGNOSIS — N131 Hydronephrosis with ureteral stricture, not elsewhere classified: Secondary | ICD-10-CM | POA: Diagnosis not present

## 2017-01-08 ENCOUNTER — Emergency Department (HOSPITAL_COMMUNITY): Payer: Medicare Other

## 2017-01-08 ENCOUNTER — Encounter (HOSPITAL_COMMUNITY): Payer: Self-pay

## 2017-01-08 ENCOUNTER — Emergency Department (HOSPITAL_COMMUNITY)
Admission: EM | Admit: 2017-01-08 | Discharge: 2017-01-08 | Disposition: A | Discharge status: Home / Self Care | Payer: Medicare Other | Attending: Emergency Medicine | Admitting: Emergency Medicine

## 2017-01-08 DIAGNOSIS — Z87442 Personal history of urinary calculi: Secondary | ICD-10-CM | POA: Insufficient documentation

## 2017-01-08 DIAGNOSIS — R109 Unspecified abdominal pain: Secondary | ICD-10-CM | POA: Diagnosis not present

## 2017-01-08 DIAGNOSIS — Z79899 Other long term (current) drug therapy: Secondary | ICD-10-CM | POA: Insufficient documentation

## 2017-01-08 DIAGNOSIS — N390 Urinary tract infection, site not specified: Secondary | ICD-10-CM | POA: Diagnosis not present

## 2017-01-08 DIAGNOSIS — E039 Hypothyroidism, unspecified: Secondary | ICD-10-CM | POA: Insufficient documentation

## 2017-01-08 DIAGNOSIS — R1031 Right lower quadrant pain: Secondary | ICD-10-CM | POA: Diagnosis not present

## 2017-01-08 DIAGNOSIS — K760 Fatty (change of) liver, not elsewhere classified: Secondary | ICD-10-CM | POA: Diagnosis not present

## 2017-01-08 LAB — COMPREHENSIVE METABOLIC PANEL
ALBUMIN: 3.9 g/dL (ref 3.5–5.0)
ALT: 33 U/L (ref 14–54)
ANION GAP: 9 (ref 5–15)
AST: 37 U/L (ref 15–41)
Alkaline Phosphatase: 59 U/L (ref 38–126)
BILIRUBIN TOTAL: 0.3 mg/dL (ref 0.3–1.2)
BUN: 16 mg/dL (ref 6–20)
CALCIUM: 8.9 mg/dL (ref 8.9–10.3)
CO2: 25 mmol/L (ref 22–32)
Chloride: 106 mmol/L (ref 101–111)
Creatinine, Ser: 0.89 mg/dL (ref 0.44–1.00)
GFR calc non Af Amer: >60 mL/min (ref >60)
GLUCOSE: 163 mg/dL — AB (ref 65–99)
POTASSIUM: 4.3 mmol/L (ref 3.5–5.1)
SODIUM: 140 mmol/L (ref 135–145)
TOTAL PROTEIN: 7 g/dL (ref 6.5–8.1)

## 2017-01-08 LAB — LIPASE, BLOOD: Lipase: 14 U/L (ref 11–51)

## 2017-01-08 LAB — URINALYSIS, ROUTINE W REFLEX MICROSCOPIC
BACTERIA UA: NONE SEEN
BILIRUBIN URINE: NEGATIVE
GLUCOSE, UA: NEGATIVE mg/dL
KETONES UR: NEGATIVE mg/dL
NITRITE: NEGATIVE
PROTEIN: 100 mg/dL — AB
Specific Gravity, Urine: 1.019 (ref 1.005–1.030)
pH: 5 (ref 5.0–8.0)

## 2017-01-08 LAB — CBC
HEMATOCRIT: 38.9 % (ref 36.0–46.0)
HEMOGLOBIN: 13.1 g/dL (ref 12.0–15.0)
MCH: 29.1 pg (ref 26.0–34.0)
MCHC: 33.7 g/dL (ref 30.0–36.0)
MCV: 86.4 fL (ref 78.0–100.0)
Platelets: 175 10*3/uL (ref 150–400)
RBC: 4.5 MIL/uL (ref 3.87–5.11)
RDW: 14 % (ref 11.5–15.5)
WBC: 5.8 10*3/uL (ref 4.0–10.5)

## 2017-01-08 LAB — I-STAT CG4 LACTIC ACID, ED: LACTIC ACID, VENOUS: 1 mmol/L (ref 0.5–1.9)

## 2017-01-08 MED ORDER — HYDROCODONE-ACETAMINOPHEN 5-325 MG PO TABS: 1.0000 | ORAL_TABLET | Freq: Four times a day (QID) | ORAL | 0 refills | Status: DC | PRN

## 2017-01-08 MED ORDER — SODIUM CHLORIDE 0.9 % IV BOLUS (SEPSIS)
1000.0000 mL | Freq: Once | INTRAVENOUS | Status: AC
  Administered 2017-01-08: 1000 mL via INTRAVENOUS

## 2017-01-08 MED ORDER — ONDANSETRON 4 MG PO TBDP: 4.0000 mg | ORAL_TABLET | Freq: Three times a day (TID) | ORAL | 0 refills | Status: DC | PRN

## 2017-01-08 MED ORDER — ONDANSETRON HCL 4 MG/2ML IJ SOLN
4.0000 mg | Freq: Once | INTRAMUSCULAR | Status: AC
  Administered 2017-01-08: 4 mg via INTRAVENOUS
  Filled 2017-01-08: qty 2

## 2017-01-08 MED ORDER — KETOROLAC TROMETHAMINE 15 MG/ML IJ SOLN
15.0000 mg | Freq: Once | INTRAMUSCULAR | Status: AC
  Administered 2017-01-08: 15 mg via INTRAVENOUS
  Filled 2017-01-08: qty 1

## 2017-01-08 MED ORDER — CEFPODOXIME PROXETIL 200 MG PO TABS: 200.0000 mg | ORAL_TABLET | Freq: Two times a day (BID) | ORAL | 0 refills | Status: DC

## 2017-01-08 MED ORDER — DEXTROSE 5 % IV SOLN
2.0000 g | Freq: Once | INTRAVENOUS | Status: AC
  Administered 2017-01-08: 2 g via INTRAVENOUS
  Filled 2017-01-08: qty 2

## 2017-01-08 MED ORDER — HYDROCODONE-ACETAMINOPHEN 5-325 MG PO TABS
1.0000 | ORAL_TABLET | Freq: Once | ORAL | Status: DC
  Filled 2017-01-08: qty 1

## 2017-01-08 MED ORDER — CEPHALEXIN 500 MG PO CAPS: 500.0000 mg | ORAL_CAPSULE | Freq: Three times a day (TID) | ORAL | 0 refills | Status: AC

## 2017-01-08 MED ORDER — HYDROMORPHONE HCL 1 MG/ML IJ SOLN
1.0000 mg | Freq: Once | INTRAMUSCULAR | Status: AC
  Administered 2017-01-08: 1 mg via INTRAVENOUS
  Filled 2017-01-08: qty 1

## 2017-01-08 NOTE — ED Triage Notes (Signed)
Per EMS, Pt from home.  Pt c/o abdominal pain with nausea since yesterday.  RLQ and worsening.  Lt hand 22g.  150 fentanyl in route.  Vitals: 134/76, hr 75, NSR, resp 16

## 2017-01-08 NOTE — ED Provider Notes (Signed)
[facility] Provider Note   CSN: [id number] Arrival date & time: 01/08/17  1148     History   Chief Complaint Chief Complaint  Patient presents with  . Abdominal Pain    HPI Miranda Mathis is a 66 y.o. female.  HPI   66 yo F with PMHx of irritable bladder, recurrent kidney stones, recurrent UTIs here with lower abd pain. Pt states she began to have mild RLQ/suprapubic abdominal pain last night, with some urinary frequency. Overnight, she developed worsening aching, cramp like, now severe pain that occasionally shoots up to her right flank. She states her sx feel similar to her prior stones. No fevers, has had some nausea and one episode of emesis. Denies any recent med changes. No vaginal bleeding. No diarrhea, constipation, or change in stools. Sx improve with certain position changes, no specific alleviating factors.  Past Medical History:  Diagnosis Date  . Allergic rhinitis, cause unspecified   . Barrett's esophagus   . Chronic back pain   . DDD (degenerative disc disease), lumbar   . Fibromyalgia   . GERD (gastroesophageal reflux disease)   . Headache(784.0)   . Hypothyroid 11/02/2013 dx  . IBS (irritable bowel syndrome)   . Stone, bladder   . Ureteral stricture, right     Patient Active Problem List   Diagnosis Date Noted  . Insomnia 10/25/2014  . Hypothyroid 11/02/2013  . Kidney stone   . Depression   . Other and unspecified hyperlipidemia 04/04/2012  . ALLERGIC RHINITIS 10/11/2007  . BACK PAIN, CHRONIC 10/11/2007  . FIBROMYALGIA 10/11/2007  . HEADACHE, CHRONIC 10/11/2007    Past Surgical History:  Procedure Laterality Date  . ABDOMINAL HYSTERECTOMY  1987  . BALLOON DILATION  07/10/2011   Procedure: BALLOON DILATION;  Surgeon: Fredricka Bonine, MD;  Location: [facility];  Service: Urology;  Laterality: Right;  . CESAREAN SECTION  X2  . CYSTO/ BILATERAL URETEROSCOPY / URETERAL BX'S/ BILATERAL URETERAL STENT PLACEMENT/  BLADDER STONE EXTRACTION  03-13-2011  . CYSTOSCOPY W/ RETROGRADES  07/10/2011   Procedure: CYSTOSCOPY WITH RETROGRADE PYELOGRAM;  Surgeon: Fredricka Bonine, MD;  Location: [facility];  Service: Urology;  Laterality: Right;  . CYSTOSCOPY W/ URETERAL STENT PLACEMENT  07/10/2011   Procedure: CYSTOSCOPY WITH STENT REPLACEMENT;  Surgeon: Fredricka Bonine, MD;  Location: [facility];  Service: Urology;  Laterality: Right;  . CYSTOSCOPY W/ URETERAL STENT REMOVAL  07/10/2011   Procedure: CYSTOSCOPY WITH STENT REMOVAL;  Surgeon: Fredricka Bonine, MD;  Location: [facility];  Service: Urology;  Laterality: Right;  . LUMBAR MICRODISCECTOMY  1990'S   L5 - S1  . RIGHT FOOT SURG  2003   HEEL  . RIGHT URETEROSCOPIC / URETERAL BX/ STENT PLACEMENT  04-17-2011    OB History    No data available       Home Medications    Prior to Admission medications   Medication Sig Start Date End Date Taking? Authorizing Provider  acetaminophen (TYLENOL) 500 MG tablet Take 1,000 mg by mouth every 4 (four) hours as needed. pain    Yes [provider]  betamethasone dipropionate 0.05 % lotion Apply 1 application topically daily as needed for itching. 12/02/16  Yes [provider]  cholecalciferol (VITAMIN D) 1000 units tablet Take 2,000 Units by mouth daily.   Yes [provider]  dicyclomine (BENTYL) 10 MG capsule Take 1 capsule (10 mg total) by mouth 3 (three) times daily before meals. Patient  taking differently: Take 20-30 mg by mouth at bedtime.  01/09/15  Yes Rowe Clack, MD  escitalopram (LEXAPRO) 20 MG tablet Take 20 mg by mouth daily.   Yes [provider]  esomeprazole (NEXIUM) 20 MG capsule Take 20 mg by mouth every evening.   Yes [provider]  ketoconazole (NIZORAL) 2 % shampoo Apply 1 application topically 2 (two) times a week. 05/29/16  Yes [provider]  levothyroxine (SYNTHROID,  LEVOTHROID) 137 MCG tablet Take 137 mcg by mouth daily before breakfast.   Yes [provider]  meloxicam (MOBIC) 15 MG tablet TAKE 1 TABLET BY MOUTH EVERY DAY 05/13/15  Yes Rowe Clack, MD  oxyCODONE-acetaminophen (ROXICET) 5-325 MG per tablet Take 1 tablet by mouth every 8 (eight) hours as needed for pain. 04/04/12  Yes Norins, Heinz Knuckles, MD  phenazopyridine (PYRIDIUM) 95 MG tablet Take 95 mg by mouth 3 (three) times daily as needed for pain.   Yes [provider]  pravastatin (PRAVACHOL) 20 MG tablet TAKE 1 TABLET DAILY 12/31/15  Yes Burns, Claudina Lick, MD  senna-docusate (SENOKOT-S) 8.6-50 MG tablet Take 1-2 tablets by mouth every evening.   Yes [provider]  zolpidem (AMBIEN) 10 MG tablet Take 1 tablet (10 mg total) by mouth at bedtime as needed. for sleep 07/29/15  Yes Burns, Claudina Lick, MD  cephALEXin (KEFLEX) 500 MG capsule Take 1 capsule (500 mg total) by mouth 3 (three) times daily. 01/08/17 01/18/17  Duffy Bruce, MD  HYDROcodone-acetaminophen (NORCO/VICODIN) 5-325 MG tablet Take 1-2 tablets by mouth every 6 (six) hours as needed for severe pain. 01/08/17   Duffy Bruce, MD  ketorolac (TORADOL) 10 MG tablet Take 1 tablet (10 mg total) by mouth every 8 (eight) hours as needed for severe pain (kidney stone). Patient not taking: Reported on 01/08/2017 01/09/15   Rowe Clack, MD  levothyroxine (SYNTHROID, LEVOTHROID) 100 MCG tablet TAKE 1 TABLET BY MOUTH EVERY DAY Patient not taking: Reported on 01/08/2017 05/13/15   Rowe Clack, MD  ondansetron (ZOFRAN ODT) 4 MG disintegrating tablet Take 1 tablet (4 mg total) by mouth every 8 (eight) hours as needed for nausea or vomiting. 01/08/17   Duffy Bruce, MD  triamcinolone lotion (KENALOG) 0.1 % Apply topically 3 (three) times daily. Must establish with NEW PCP for additional refills. Patient not taking: Reported on 01/08/2017 05/30/15   Rowe Clack, MD    Family History Family History  Problem  Relation Age of Onset  . COPD Mother   . Emphysema Mother   . Osteoarthritis Mother   . Rheum arthritis Mother   . Prostate cancer Father        w/mets  . Coronary artery disease Paternal Grandfather   . Diabetes Paternal Aunt        x 3  . Colon cancer Neg Hx     Social History Social History  Substance Use Topics  . Smoking status: Never Smoker  . Smokeless tobacco: Never Used  . Alcohol use 0.6 oz/week    1 Standard drinks or equivalent per week     Comment: rarely     Allergies   Morphine   Review of Systems Review of Systems  Constitutional: Positive for fatigue.  Gastrointestinal: Positive for abdominal pain, nausea and vomiting.  Genitourinary: Positive for flank pain and frequency.  All other systems reviewed and are negative.    Physical Exam Updated Vital Signs BP (!) 111/59 (BP Location: Left Arm)   Pulse (!) 58  Temp 97.8 F (36.6 C) (Oral)   Resp 18   Ht 5\' 2"  (1.575 m)   Wt 99.8 kg (220 lb)   SpO2 97%   BMI 40.24 kg/m   Physical Exam  Constitutional: She is oriented to person, place, and time. She appears well-developed and well-nourished. No distress.  HENT:  Head: Normocephalic and atraumatic.  Eyes: Conjunctivae are normal.  Neck: Neck supple.  Cardiovascular: Normal rate, regular rhythm and normal heart sounds.  Exam reveals no friction rub.   No murmur heard. Pulmonary/Chest: Effort normal and breath sounds normal. No respiratory distress. She has no wheezes. She has no rales.  Abdominal: Soft. Bowel sounds are normal. She exhibits no distension. There is tenderness (mild TTP over right flank w/o overt CVAT, suprapubic TTP noted).  Musculoskeletal: She exhibits no edema.  Neurological: She is alert and oriented to person, place, and time. She exhibits normal muscle tone.  Skin: Skin is warm. Capillary refill takes less than 2 seconds.  Psychiatric: She has a normal mood and affect.  Nursing note and vitals reviewed.    ED  Treatments / Results  Labs (all labs ordered are listed, but only abnormal results are displayed) Labs Reviewed  COMPREHENSIVE METABOLIC PANEL - Abnormal; Notable for the following:       Result Value   Glucose, Bld 163 (*)    All other components within normal limits  URINALYSIS, ROUTINE W REFLEX MICROSCOPIC - Abnormal; Notable for the following:    APPearance HAZY (*)    Hgb urine dipstick LARGE (*)    Protein, ur 100 (*)    Leukocytes, UA SMALL (*)    Squamous Epithelial / LPF 0-5 (*)    All other components within normal limits  URINE CULTURE  LIPASE, BLOOD  CBC  I-STAT CG4 LACTIC ACID, ED    EKG  EKG Interpretation None       Radiology Ct Renal Stone Study  Result Date: 01/08/2017 CLINICAL DATA:  Worsening right lower quadrant pain and nausea since yesterday. EXAM: CT ABDOMEN AND PELVIS WITHOUT CONTRAST TECHNIQUE: Multidetector CT imaging of the abdomen and pelvis was performed following the standard protocol without IV contrast. COMPARISON:  11/03/2015.  02/20/2011. FINDINGS: Lower chest:  Unremarkable. Hepatobiliary: The liver shows diffusely decreased attenuation suggesting steatosis. Gallbladder surgically absent. No intrahepatic or extrahepatic biliary dilation. Pancreas: No focal mass lesion. No dilatation of the main duct. No intraparenchymal cyst. No peripancreatic edema. Spleen: No splenomegaly. No focal mass lesion. Adrenals/Urinary Tract: No adrenal nodule or mass. No right renal stones. Mild right hydronephrosis is similar to prior study. No right hydroureter. No left renal stones.  No left hydroureteronephrosis. The urinary bladder appears normal for the degree of distention. Stomach/Bowel: Stomach is nondistended. No gastric wall thickening. No evidence of outlet obstruction. Duodenum is normally positioned as is the ligament of Treitz. No small bowel wall thickening. No small bowel dilatation. The terminal ileum is normal. Diverticular changes are noted in the left  colon without evidence of diverticulitis. Vascular/Lymphatic: There is abdominal aortic atherosclerosis without aneurysm. There is no gastrohepatic or hepatoduodenal ligament lymphadenopathy. No intraperitoneal or retroperitoneal lymphadenopathy. No pelvic sidewall lymphadenopathy. Reproductive: Uterus surgically absent. 18 mm cystic lesion left adnexal space is similar to prior, measuring 17 mm on prior study. Right ovary unremarkable. Other: No intraperitoneal free fluid. Musculoskeletal: Bone windows reveal no worrisome lytic or sclerotic osseous lesions. IMPRESSION: 1. Similar appearance of right hydronephrosis comparing back to 02/20/2011. There is no right ureteral distention on today's study although  the 2012 exam showed mild right hydroureter. Imaging features suggest chronic etiology and potentially related to a component of UPJ obstruction although it is unclear how the right ureteral distention on the remote study would fit into that scenario. There is no evidence for obstructing stone. The relatively long chronicity makes obstruction due to urothelial lesion less likely but does not exclude it. 2.  Aortic Atherosclerois (ICD10-170.0) 3. Hepatic steatosis. Electronically Signed   By: Misty Stanley M.D.   On: 01/08/2017 13:47    Procedures Procedures (including critical care time)  Medications Ordered in ED Medications  sodium chloride 0.9 % bolus 1,000 mL (0 mLs Intravenous Stopped 01/08/17 1357)  ondansetron (ZOFRAN) injection 4 mg (4 mg Intravenous Given 01/08/17 1252)  HYDROmorphone (DILAUDID) injection 1 mg (1 mg Intravenous Given 01/08/17 1253)  cefTRIAXone (ROCEPHIN) 2 g in dextrose 5 % 50 mL IVPB (0 g Intravenous Stopped 01/08/17 1428)  ketorolac (TORADOL) 15 MG/ML injection 15 mg (15 mg Intravenous Given 01/08/17 1457)     Initial Impression / Assessment and Plan / ED Course  I have reviewed the triage vital signs and the nursing notes.  Pertinent labs & imaging results that were  available during my care of the patient were reviewed by me and considered in my medical decision making (see chart for details).     66 yo F with recurrent UTIs, kidney stones, bladder spasms here with suprapubic/R flank pain. I suspect her sx are 2/2 recently passed stone versus UTI with bladder spasms. Cannot r/o very early pyelonephritis but pt has normal VS, normal temp, normal HR, no vomiting, normal WBC - no signs of sepsis. UA is c/w UTI. Renal function normal. CT scan obtained and shows baseline hydro w/o acute abnormality. Suspect uncomplicated UTI with possible early signs of ascending infection. Pt given Rocephin here. She is not immune suppressed. D/c with keflex, supportive care. UCx sent.  Final Clinical Impressions(s) / ED Diagnoses   Final diagnoses:  Lower urinary tract infectious disease  Flank pain    New Prescriptions Discharge Medication List as of 01/08/2017  3:45 PM    START taking these medications   Details  cephALEXin (KEFLEX) 500 MG capsule Take 1 capsule (500 mg total) by mouth 3 (three) times daily., Starting Fri 01/08/2017, Until Mon 01/18/2017, Print         Duffy Bruce, MD 01/09/17 [phone]

## 2017-01-08 NOTE — ED Notes (Signed)
Pt given gingerale and is currently tolerating

## 2017-01-08 NOTE — ED Notes (Signed)
Bed: WA07 Expected date:  Expected time:  Means of arrival:  Comments: EMS- Abdominal pain 

## 2017-01-10 LAB — URINE CULTURE

## 2017-02-04 ENCOUNTER — Emergency Department (HOSPITAL_BASED_OUTPATIENT_CLINIC_OR_DEPARTMENT_OTHER)
Admission: EM | Admit: 2017-02-04 | Discharge: 2017-02-04 | Disposition: A | Discharge status: Home / Self Care | Payer: Medicare Other | Attending: Emergency Medicine | Admitting: Emergency Medicine

## 2017-02-04 ENCOUNTER — Encounter (HOSPITAL_BASED_OUTPATIENT_CLINIC_OR_DEPARTMENT_OTHER): Payer: Self-pay | Admitting: *Deleted

## 2017-02-04 DIAGNOSIS — R112 Nausea with vomiting, unspecified: Secondary | ICD-10-CM | POA: Diagnosis not present

## 2017-02-04 DIAGNOSIS — R1031 Right lower quadrant pain: Secondary | ICD-10-CM | POA: Diagnosis not present

## 2017-02-04 DIAGNOSIS — Z79899 Other long term (current) drug therapy: Secondary | ICD-10-CM | POA: Diagnosis not present

## 2017-02-04 DIAGNOSIS — G8929 Other chronic pain: Secondary | ICD-10-CM | POA: Diagnosis not present

## 2017-02-04 DIAGNOSIS — R109 Unspecified abdominal pain: Secondary | ICD-10-CM

## 2017-02-04 LAB — CBC WITH DIFFERENTIAL/PLATELET
Basophils Absolute: 0 10*3/uL (ref 0.0–0.1)
Basophils Relative: 0 %
Eosinophils Absolute: 0.1 10*3/uL (ref 0.0–0.7)
Eosinophils Relative: 2 %
HEMATOCRIT: 40.1 % (ref 36.0–46.0)
Hemoglobin: 13.3 g/dL (ref 12.0–15.0)
LYMPHS ABS: 1 10*3/uL (ref 0.7–4.0)
LYMPHS PCT: 17 %
MCH: 29.4 pg (ref 26.0–34.0)
MCHC: 33.2 g/dL (ref 30.0–36.0)
MCV: 88.5 fL (ref 78.0–100.0)
MONO ABS: 0.4 10*3/uL (ref 0.1–1.0)
MONOS PCT: 6 %
NEUTROS ABS: 4.3 10*3/uL (ref 1.7–7.7)
Neutrophils Relative %: 75 %
PLATELETS: 173 10*3/uL (ref 150–400)
RBC: 4.53 MIL/uL (ref 3.87–5.11)
RDW: 14 % (ref 11.5–15.5)
WBC: 5.8 10*3/uL (ref 4.0–10.5)

## 2017-02-04 LAB — BASIC METABOLIC PANEL
ANION GAP: 9 (ref 5–15)
BUN: 19 mg/dL (ref 6–20)
CO2: 25 mmol/L (ref 22–32)
Calcium: 9.1 mg/dL (ref 8.9–10.3)
Chloride: 104 mmol/L (ref 101–111)
Creatinine, Ser: 1.03 mg/dL — ABNORMAL HIGH (ref 0.44–1.00)
GFR calc Af Amer: >60 mL/min (ref >60)
GFR calc non Af Amer: 55 mL/min — ABNORMAL LOW (ref >60)
Glucose, Bld: 172 mg/dL — ABNORMAL HIGH (ref 65–99)
POTASSIUM: 4.2 mmol/L (ref 3.5–5.1)
Sodium: 138 mmol/L (ref 135–145)

## 2017-02-04 LAB — URINALYSIS, ROUTINE W REFLEX MICROSCOPIC
Bilirubin Urine: NEGATIVE
GLUCOSE, UA: NEGATIVE mg/dL
KETONES UR: NEGATIVE mg/dL
Nitrite: NEGATIVE
PROTEIN: 100 mg/dL — AB
Specific Gravity, Urine: 1.022 (ref 1.005–1.030)
pH: 5 (ref 5.0–8.0)

## 2017-02-04 LAB — URINALYSIS, MICROSCOPIC (REFLEX)

## 2017-02-04 MED ORDER — ONDANSETRON HCL 4 MG/2ML IJ SOLN
4.0000 mg | Freq: Once | INTRAMUSCULAR | Status: AC
  Administered 2017-02-04: 4 mg via INTRAVENOUS
  Filled 2017-02-04: qty 2

## 2017-02-04 MED ORDER — FENTANYL CITRATE (PF) 100 MCG/2ML IJ SOLN
50.0000 ug | Freq: Once | INTRAMUSCULAR | Status: AC
Start: 2017-02-04 — End: 2017-02-04
  Administered 2017-02-04: 50 ug via INTRAVENOUS
  Filled 2017-02-04: qty 2

## 2017-02-04 NOTE — ED Notes (Signed)
Pt given water for PO challenge 

## 2017-02-04 NOTE — ED Notes (Signed)
Pt states she is feeling better and wants to know when she will be d/c

## 2017-02-04 NOTE — Discharge Instructions (Signed)
Your lab work has been reassuring. Your urine shows no signs of infection but have sent for a culture. Will be informed if this needs to be treated. It is important that she follow up with her urologist. He may continue using her Zofran at home for nausea. Motrin and Tylenol for pain. If he develop any fevers, worsening vomiting, worsening pain return to the ED.

## 2017-02-04 NOTE — ED Provider Notes (Signed)
[place] [facility] Provider Note   CSN: [id number] Arrival date & time: 02/04/17  1045     History   Chief Complaint Chief Complaint  Patient presents with  . Abdominal Pain    HPI Miranda Mathis is a 66 y.o. female.  HPI 66 year old female past medical history significant for IBS, recurrent kidney stones, recurrent UTIs, fibromyalgia that presents to the ED today with complaints of right flank pain that radiates to her right lower abdomen. Patient states that her symptoms have been present for the past 2-3 months. They're intermittent. Describes the pain as cramping. The intensity comes and goes. Patient does have nausea and 2 episodes of emesis that is nonbloody this morning. She has not tried any of her symptoms prior to arrival. Nothing makes better or worse.   Patient has been seen by urology in the past for several kidney stones that required stenting. Patient states this feels similar to her kidney stone pain. Patient was seen in the ED on 7/21 for same. At that time she had CT scan that showed no significant ureteral stone. Did show chronic hydronephrosis with possible chronic  UPJ obstruction. At that time patient was treated for possible UTI versus early pyelonephritis with Keflex. Patient states this did not improve her symptoms. Urine culture showed no significant growth. Patient will follow-up with her urologist as of yet.   Pt denies any fever, chill, ha, vision changes, lightheadedness, dizziness, congestion, neck pain, cp, sob, cough, diarrhea, urinary symptoms, vaginal symptoms, change in bowel habits, melena, hematochezia, lower extremity paresthesias.  Past Medical History:  Diagnosis Date  . Allergic rhinitis, cause unspecified   . Barrett's esophagus   . Chronic back pain   . DDD (degenerative disc disease), lumbar   . Fibromyalgia   . GERD (gastroesophageal reflux disease)   . Headache(784.0)   . Hypothyroid 11/02/2013 dx  . IBS (irritable bowel syndrome)     . Stone, bladder   . Ureteral stricture, right     Patient Active Problem List   Diagnosis Date Noted  . Insomnia 10/25/2014  . Hypothyroid 11/02/2013  . Kidney stone   . Depression   . Other and unspecified hyperlipidemia 04/04/2012  . ALLERGIC RHINITIS 10/11/2007  . BACK PAIN, CHRONIC 10/11/2007  . FIBROMYALGIA 10/11/2007  . HEADACHE, CHRONIC 10/11/2007    Past Surgical History:  Procedure Laterality Date  . ABDOMINAL HYSTERECTOMY  1987  . BALLOON DILATION  07/10/2011   Procedure: BALLOON DILATION;  Surgeon: Fredricka Bonine, MD;  Location: [facility];  Service: Urology;  Laterality: Right;  . CESAREAN SECTION  X2  . CYSTO/ BILATERAL URETEROSCOPY / URETERAL BX'S/ BILATERAL URETERAL STENT PLACEMENT/ BLADDER STONE EXTRACTION  03-13-2011  . CYSTOSCOPY W/ RETROGRADES  07/10/2011   Procedure: CYSTOSCOPY WITH RETROGRADE PYELOGRAM;  Surgeon: Fredricka Bonine, MD;  Location: [facility];  Service: Urology;  Laterality: Right;  . CYSTOSCOPY W/ URETERAL STENT PLACEMENT  07/10/2011   Procedure: CYSTOSCOPY WITH STENT REPLACEMENT;  Surgeon: Fredricka Bonine, MD;  Location: [facility];  Service: Urology;  Laterality: Right;  . CYSTOSCOPY W/ URETERAL STENT REMOVAL  07/10/2011   Procedure: CYSTOSCOPY WITH STENT REMOVAL;  Surgeon: Fredricka Bonine, MD;  Location: [facility];  Service: Urology;  Laterality: Right;  . LUMBAR MICRODISCECTOMY  1990'S   L5 - S1  . RIGHT FOOT SURG  2003   HEEL  . RIGHT URETEROSCOPIC / URETERAL BX/ STENT PLACEMENT  04-17-2011    OB  History    No data available       Home Medications    Prior to Admission medications   Medication Sig Start Date End Date Taking? Authorizing Provider  acetaminophen (TYLENOL) 500 MG tablet Take 1,000 mg by mouth every 4 (four) hours as needed. pain     [provider]  betamethasone dipropionate 0.05 % lotion Apply 1 application  topically daily as needed for itching. 12/02/16   [provider]  cholecalciferol (VITAMIN D) 1000 units tablet Take 2,000 Units by mouth daily.    [provider]  dicyclomine (BENTYL) 10 MG capsule Take 1 capsule (10 mg total) by mouth 3 (three) times daily before meals. Patient taking differently: Take 20-30 mg by mouth at bedtime.  01/09/15   Rowe Clack, MD  escitalopram (LEXAPRO) 20 MG tablet Take 20 mg by mouth daily.    [provider]  esomeprazole (NEXIUM) 20 MG capsule Take 20 mg by mouth every evening.    [provider]  HYDROcodone-acetaminophen (NORCO/VICODIN) 5-325 MG tablet Take 1-2 tablets by mouth every 6 (six) hours as needed for severe pain. 01/08/17   Duffy Bruce, MD  ketoconazole (NIZORAL) 2 % shampoo Apply 1 application topically 2 (two) times a week. 05/29/16   [provider]  ketorolac (TORADOL) 10 MG tablet Take 1 tablet (10 mg total) by mouth every 8 (eight) hours as needed for severe pain (kidney stone). Patient not taking: Reported on 01/08/2017 01/09/15   Rowe Clack, MD  levothyroxine (SYNTHROID, LEVOTHROID) 100 MCG tablet TAKE 1 TABLET BY MOUTH EVERY DAY Patient not taking: Reported on 01/08/2017 05/13/15   Rowe Clack, MD  levothyroxine (SYNTHROID, LEVOTHROID) 137 MCG tablet Take 137 mcg by mouth daily before breakfast.    [provider]  meloxicam (MOBIC) 15 MG tablet TAKE 1 TABLET BY MOUTH EVERY DAY 05/13/15   Rowe Clack, MD  ondansetron (ZOFRAN ODT) 4 MG disintegrating tablet Take 1 tablet (4 mg total) by mouth every 8 (eight) hours as needed for nausea or vomiting. 01/08/17   Duffy Bruce, MD  oxyCODONE-acetaminophen (ROXICET) 5-325 MG per tablet Take 1 tablet by mouth every 8 (eight) hours as needed for pain. 04/04/12   Norins, Heinz Knuckles, MD  phenazopyridine (PYRIDIUM) 95 MG tablet Take 95 mg by mouth 3 (three) times daily as needed for pain.    [provider]    pravastatin (PRAVACHOL) 20 MG tablet TAKE 1 TABLET DAILY 12/31/15   Burns, Claudina Lick, MD  senna-docusate (SENOKOT-S) 8.6-50 MG tablet Take 1-2 tablets by mouth every evening.    [provider]  triamcinolone lotion (KENALOG) 0.1 % Apply topically 3 (three) times daily. Must establish with NEW PCP for additional refills. Patient not taking: Reported on 01/08/2017 05/30/15   Rowe Clack, MD  zolpidem (AMBIEN) 10 MG tablet Take 1 tablet (10 mg total) by mouth at bedtime as needed. for sleep 07/29/15   Binnie Rail, MD    Family History Family History  Problem Relation Age of Onset  . COPD Mother   . Emphysema Mother   . Osteoarthritis Mother   . Rheum arthritis Mother   . Prostate cancer Father        w/mets  . Coronary artery disease Paternal Grandfather   . Diabetes Paternal Aunt        x 3  . Colon cancer Neg Hx     Social History Social History  Substance Use Topics  . Smoking status:  Never Smoker  . Smokeless tobacco: Never Used  . Alcohol use 0.6 oz/week    1 Standard drinks or equivalent per week     Comment: rarely     Allergies   Morphine   Review of Systems Review of Systems  Constitutional: Negative for chills and fever.  HENT: Negative for congestion.   Eyes: Negative for visual disturbance.  Respiratory: Negative for cough and shortness of breath.   Cardiovascular: Negative for chest pain.  Gastrointestinal: Positive for abdominal pain, nausea and vomiting. Negative for diarrhea.  Genitourinary: Positive for flank pain. Negative for dysuria, frequency, hematuria, urgency, vaginal bleeding and vaginal discharge.  Musculoskeletal: Negative for arthralgias and myalgias.  Skin: Negative for rash.  Neurological: Negative for dizziness, syncope, weakness, light-headedness, numbness and headaches.  Psychiatric/Behavioral: Negative for sleep disturbance. The patient is not nervous/anxious.      Physical Exam Updated Vital Signs Ht 5\' 2"  (1.575  m)   Wt 99.8 kg (220 lb)   BMI 40.24 kg/m   Physical Exam  Constitutional: She is oriented to person, place, and time. She appears well-developed and well-nourished.  Non-toxic appearance. No distress.  HENT:  Head: Normocephalic and atraumatic.  Nose: Nose normal.  Mouth/Throat: Oropharynx is clear and moist.  Eyes: Pupils are equal, round, and reactive to light. Conjunctivae are normal. Right eye exhibits no discharge. Left eye exhibits no discharge.  Neck: Normal range of motion. Neck supple.  Cardiovascular: Normal rate, regular rhythm, normal heart sounds and intact distal pulses.   Pulmonary/Chest: Effort normal and breath sounds normal. No respiratory distress. She exhibits no tenderness.  Abdominal: Soft. Bowel sounds are normal. There is no tenderness. There is no rigidity, no rebound, no guarding, no CVA tenderness, no tenderness at McBurney's point and negative Murphy's sign.  Musculoskeletal: Normal range of motion. She exhibits no tenderness.  Lymphadenopathy:    She has no cervical adenopathy.  Neurological: She is alert and oriented to person, place, and time.  Skin: Skin is warm and dry. Capillary refill takes less than 2 seconds.  Psychiatric: Her behavior is normal. Judgment and thought content normal.  Nursing note and vitals reviewed.    ED Treatments / Results  Labs (all labs ordered are listed, but only abnormal results are displayed) Labs Reviewed  BASIC METABOLIC PANEL - Abnormal; Notable for the following:       Result Value   Glucose, Bld 172 (*)    Creatinine, Ser 1.03 (*)    GFR calc non Af Amer 55 (*)    All other components within normal limits  URINALYSIS, ROUTINE W REFLEX MICROSCOPIC - Abnormal; Notable for the following:    APPearance CLOUDY (*)    Hgb urine dipstick LARGE (*)    Protein, ur 100 (*)    Leukocytes, UA MODERATE (*)    All other components within normal limits  URINALYSIS, MICROSCOPIC (REFLEX) - Abnormal; Notable for the  following:    Bacteria, UA FEW (*)    Squamous Epithelial / LPF 0-5 (*)    All other components within normal limits  URINE CULTURE  CBC WITH DIFFERENTIAL/PLATELET    EKG  EKG Interpretation None       Radiology No results found.  Procedures Procedures (including critical care time)  Medications Ordered in ED Medications  ondansetron (ZOFRAN) injection 4 mg (4 mg Intravenous Given 02/04/17 1228)  fentaNYL (SUBLIMAZE) injection 50 mcg (50 mcg Intravenous Given 02/04/17 1228)     Initial Impression / Assessment and Plan / ED Course  I have reviewed the triage vital signs and the nursing notes.  Pertinent labs & imaging results that were available during my care of the patient were reviewed by me and considered in my medical decision making (see chart for details).     Patient presents to the ED with complaints of right flank pain that radiates to her right lower abdomen. Patient with history of recurrent nephrolithiasis and hydronephrosis does require stenting in the past. Patient was seen in ED approximately one month ago for same. CT scan that time showed chronic hydronephrosis the right side but no obvious ureteral stones. She was treated for UTI that time that did not improve her symptoms. Urine culture that time showed no significant growth.   The patient states that his symptoms have persisted. Has not followed up with urology. Patient is overall well-appearing and nontoxic. Vital signs are reassuring. Patient is afebrile.  Abdominal exam is benign any focal abdominal tenderness. No CVA tenderness noted concerning for pyelonephritis.  Lab work is reassuring. Mild elevation of patient's creatinine likely secondary to dehydration from vomiting. She was able tolerate by mouth fluids in the ED and encouraged her to continue the by mouth fluid intake at home. No leukocytosis. All other electrolytes were baseline.  UA with large amounts of blood and WBCs. Few bacteria. Patient  denies any urinary symptoms. This seems similar to her urine when she was treated for UTI and July without any significant growth from the urine culture. Low suspicion for UTI but will send for culture and not treat at this time. Patient is agreeable to this plan.  Patient's pain and nausea has been controlled in the ED. She is able tolerate by mouth fluids without any emesis.  Pt is hemodynamically stable, in NAD, & able to ambulate in the ED. Evaluation does not show pathology that would require ongoing emergent intervention or inpatient treatment. I explained the diagnosis to the patient. Pain has been managed & has no complaints prior to dc. Pt is comfortable with above plan and is stable for discharge at this time. All questions were answered prior to disposition. Strict return precautions for f/u to the ED were discussed. Encouraged follow up with urology.  Seen and evaluated with my attending who is agreeable the above plan.   Final Clinical Impressions(s) / ED Diagnoses   Final diagnoses:  Right flank pain, chronic    New Prescriptions New Prescriptions   No medications on file     Aaron Edelman 02/04/17 1335

## 2017-02-04 NOTE — ED Notes (Signed)
ED Provider at bedside. Dr. Leonette Monarch

## 2017-02-04 NOTE — ED Notes (Signed)
Pt has a ride at bedside. Pt ambulatory with steady gait to d/c window

## 2017-02-04 NOTE — ED Notes (Signed)
Dorothea Ogle, EDPA at bedside

## 2017-02-04 NOTE — ED Triage Notes (Signed)
Pt amb to triage with quick steady gait in nad. Pt reports nausea and right flank pain radiating to her right abd x this am. Pt states she has a hx of kidney stones, and this feels similar to her renal stone pain.

## 2017-02-04 NOTE — ED Provider Notes (Signed)
Medical screening examination/treatment/procedure(s) were conducted as a shared visit with non-physician practitioner(s) and myself.  I personally evaluated the patient during the encounter. Briefly, the patient is a 66 y.o. female with a history of nephrolithiasis and chronic right hydronephrosis who presents to the emergency department for several months of recurrent right flank/abdominal cramping similar to prior renal colic. Patient denies any dysuria, frequency, urgency, fevers, chills. Denies any other physical complaints. Abdomen benign. UA notable for hematuria; not highly concerning for urinary tract infection but we'll send for culture. Labs grossly reassuring. Possible small stones that were not identified on CT versus ureteral spasming from hydronephrosis. No need for advanced imaging at this time. The patient is safe for discharge with strict return precautions.     EKG Interpretation None           Cardama, Grayce Sessions, MD 02/04/17 1313

## 2017-02-05 LAB — URINE CULTURE: Culture: <10000 — AB

## 2017-03-10 ENCOUNTER — Encounter (HOSPITAL_COMMUNITY): Payer: Self-pay | Admitting: Emergency Medicine

## 2017-03-10 ENCOUNTER — Emergency Department (HOSPITAL_COMMUNITY)
Admission: EM | Admit: 2017-03-10 | Discharge: 2017-03-10 | Disposition: A | Discharge status: Home / Self Care | Payer: Medicare Other | Attending: Emergency Medicine | Admitting: Emergency Medicine

## 2017-03-10 DIAGNOSIS — R109 Unspecified abdominal pain: Secondary | ICD-10-CM | POA: Diagnosis not present

## 2017-03-10 DIAGNOSIS — Z5321 Procedure and treatment not carried out due to patient leaving prior to being seen by health care provider: Secondary | ICD-10-CM | POA: Insufficient documentation

## 2017-03-10 LAB — COMPREHENSIVE METABOLIC PANEL
ALBUMIN: 4.2 g/dL (ref 3.5–5.0)
ALT: 33 U/L (ref 14–54)
ANION GAP: 7 (ref 5–15)
AST: 34 U/L (ref 15–41)
Alkaline Phosphatase: 75 U/L (ref 38–126)
BUN: 20 mg/dL (ref 6–20)
CHLORIDE: 106 mmol/L (ref 101–111)
CO2: 26 mmol/L (ref 22–32)
CREATININE: 0.9 mg/dL (ref 0.44–1.00)
Calcium: 9.2 mg/dL (ref 8.9–10.3)
GFR calc non Af Amer: >60 mL/min (ref >60)
GLUCOSE: 183 mg/dL — AB (ref 65–99)
Potassium: 4.4 mmol/L (ref 3.5–5.1)
Sodium: 139 mmol/L (ref 135–145)
Total Bilirubin: 0.6 mg/dL (ref 0.3–1.2)
Total Protein: 7.3 g/dL (ref 6.5–8.1)

## 2017-03-10 LAB — CBC
HCT: 41.4 % (ref 36.0–46.0)
Hemoglobin: 13.8 g/dL (ref 12.0–15.0)
MCH: 29.3 pg (ref 26.0–34.0)
MCHC: 33.3 g/dL (ref 30.0–36.0)
MCV: 87.9 fL (ref 78.0–100.0)
Platelets: 180 10*3/uL (ref 150–400)
RBC: 4.71 MIL/uL (ref 3.87–5.11)
RDW: 13.9 % (ref 11.5–15.5)
WBC: 6.7 10*3/uL (ref 4.0–10.5)

## 2017-03-10 LAB — LIPASE, BLOOD: LIPASE: 24 U/L (ref 11–51)

## 2017-03-10 MED ORDER — ONDANSETRON 4 MG PO TBDP
4.0000 mg | ORAL_TABLET | Freq: Once | ORAL | Status: AC
  Administered 2017-03-10: 4 mg via ORAL
  Filled 2017-03-10: qty 1

## 2017-03-10 NOTE — ED Triage Notes (Signed)
Pt presents RLQ pain radiating to RUQ starting this morning along with nausea. States this area has been tender for a week, but unbearable today. Denies vomiting and diarrhea.

## 2017-03-18 DIAGNOSIS — F5101 Primary insomnia: Secondary | ICD-10-CM | POA: Diagnosis not present

## 2017-03-18 DIAGNOSIS — R1031 Right lower quadrant pain: Secondary | ICD-10-CM | POA: Diagnosis not present

## 2017-03-19 DIAGNOSIS — R1031 Right lower quadrant pain: Secondary | ICD-10-CM | POA: Diagnosis not present

## 2017-03-19 DIAGNOSIS — Z1211 Encounter for screening for malignant neoplasm of colon: Secondary | ICD-10-CM | POA: Diagnosis not present

## 2017-03-19 DIAGNOSIS — Z8719 Personal history of other diseases of the digestive system: Secondary | ICD-10-CM | POA: Diagnosis not present

## 2017-03-19 DIAGNOSIS — K219 Gastro-esophageal reflux disease without esophagitis: Secondary | ICD-10-CM | POA: Diagnosis not present

## 2017-03-24 ENCOUNTER — Emergency Department (HOSPITAL_BASED_OUTPATIENT_CLINIC_OR_DEPARTMENT_OTHER)
Admission: EM | Admit: 2017-03-24 | Discharge: 2017-03-24 | Disposition: A | Discharge status: Home / Self Care | Payer: Medicare Other | Attending: Emergency Medicine | Admitting: Emergency Medicine

## 2017-03-24 ENCOUNTER — Emergency Department (HOSPITAL_BASED_OUTPATIENT_CLINIC_OR_DEPARTMENT_OTHER): Payer: Medicare Other

## 2017-03-24 ENCOUNTER — Encounter (HOSPITAL_BASED_OUTPATIENT_CLINIC_OR_DEPARTMENT_OTHER): Payer: Self-pay | Admitting: Emergency Medicine

## 2017-03-24 DIAGNOSIS — E039 Hypothyroidism, unspecified: Secondary | ICD-10-CM | POA: Insufficient documentation

## 2017-03-24 DIAGNOSIS — R109 Unspecified abdominal pain: Secondary | ICD-10-CM | POA: Diagnosis not present

## 2017-03-24 DIAGNOSIS — N1330 Unspecified hydronephrosis: Secondary | ICD-10-CM | POA: Diagnosis not present

## 2017-03-24 DIAGNOSIS — Z79899 Other long term (current) drug therapy: Secondary | ICD-10-CM | POA: Diagnosis not present

## 2017-03-24 DIAGNOSIS — N200 Calculus of kidney: Secondary | ICD-10-CM | POA: Diagnosis not present

## 2017-03-24 DIAGNOSIS — R110 Nausea: Secondary | ICD-10-CM | POA: Diagnosis not present

## 2017-03-24 LAB — URINALYSIS, ROUTINE W REFLEX MICROSCOPIC
Bilirubin Urine: NEGATIVE
GLUCOSE, UA: NEGATIVE mg/dL
KETONES UR: NEGATIVE mg/dL
LEUKOCYTES UA: NEGATIVE
Nitrite: NEGATIVE
PH: 6 (ref 5.0–8.0)
Protein, ur: 100 mg/dL — AB
Specific Gravity, Urine: 1.025 (ref 1.005–1.030)

## 2017-03-24 LAB — CBC WITH DIFFERENTIAL/PLATELET
BASOS PCT: 0 %
Basophils Absolute: 0 10*3/uL (ref 0.0–0.1)
EOS PCT: 5 %
Eosinophils Absolute: 0.4 10*3/uL (ref 0.0–0.7)
HCT: 40.3 % (ref 36.0–46.0)
HEMOGLOBIN: 13.7 g/dL (ref 12.0–15.0)
LYMPHS PCT: 21 %
Lymphs Abs: 1.5 10*3/uL (ref 0.7–4.0)
MCH: 29.9 pg (ref 26.0–34.0)
MCHC: 34 g/dL (ref 30.0–36.0)
MCV: 88 fL (ref 78.0–100.0)
MONOS PCT: 8 %
Monocytes Absolute: 0.6 10*3/uL (ref 0.1–1.0)
NEUTROS ABS: 4.5 10*3/uL (ref 1.7–7.7)
Neutrophils Relative %: 66 %
Platelets: ADEQUATE 10*3/uL (ref 150–400)
RBC: 4.58 MIL/uL (ref 3.87–5.11)
RDW: 14 % (ref 11.5–15.5)
SMEAR REVIEW: ADEQUATE
WBC: 7 10*3/uL (ref 4.0–10.5)

## 2017-03-24 LAB — COMPREHENSIVE METABOLIC PANEL
ALK PHOS: 63 U/L (ref 38–126)
ALT: 30 U/L (ref 14–54)
AST: 35 U/L (ref 15–41)
Albumin: 3.7 g/dL (ref 3.5–5.0)
Anion gap: 6 (ref 5–15)
BUN: 19 mg/dL (ref 6–20)
CALCIUM: 8.6 mg/dL — AB (ref 8.9–10.3)
CO2: 27 mmol/L (ref 22–32)
CREATININE: 0.75 mg/dL (ref 0.44–1.00)
Chloride: 107 mmol/L (ref 101–111)
Glucose, Bld: 161 mg/dL — ABNORMAL HIGH (ref 65–99)
Potassium: 4 mmol/L (ref 3.5–5.1)
Sodium: 140 mmol/L (ref 135–145)
Total Bilirubin: 0.3 mg/dL (ref 0.3–1.2)
Total Protein: 7 g/dL (ref 6.5–8.1)

## 2017-03-24 LAB — URINALYSIS, MICROSCOPIC (REFLEX)

## 2017-03-24 LAB — LIPASE, BLOOD: Lipase: 31 U/L (ref 11–51)

## 2017-03-24 MED ORDER — FENTANYL CITRATE (PF) 100 MCG/2ML IJ SOLN
50.0000 ug | Freq: Once | INTRAMUSCULAR | Status: AC
  Administered 2017-03-24: 50 ug via INTRAVENOUS
  Filled 2017-03-24: qty 2

## 2017-03-24 MED ORDER — KETOROLAC TROMETHAMINE 30 MG/ML IJ SOLN
INTRAMUSCULAR | Status: AC
Start: 2017-03-24 — End: 2017-03-24
  Administered 2017-03-24: 30 mg via INTRAVENOUS
  Filled 2017-03-24: qty 1

## 2017-03-24 MED ORDER — KETOROLAC TROMETHAMINE 30 MG/ML IJ SOLN
30.0000 mg | Freq: Once | INTRAMUSCULAR | Status: AC
  Administered 2017-03-24: 30 mg via INTRAVENOUS

## 2017-03-24 MED ORDER — ONDANSETRON HCL 4 MG/2ML IJ SOLN
4.0000 mg | Freq: Once | INTRAMUSCULAR | Status: AC
  Administered 2017-03-24: 4 mg via INTRAVENOUS
  Filled 2017-03-24: qty 2

## 2017-03-24 MED ORDER — SODIUM CHLORIDE 0.9 % IV BOLUS (SEPSIS)
1000.0000 mL | Freq: Once | INTRAVENOUS | Status: AC
  Administered 2017-03-24: 1000 mL via INTRAVENOUS

## 2017-03-24 MED ORDER — OXYCODONE-ACETAMINOPHEN 5-325 MG PO TABS: 1.0000 | ORAL_TABLET | ORAL | 0 refills | Status: DC | PRN

## 2017-03-24 MED ORDER — HYDROMORPHONE HCL 1 MG/ML IJ SOLN
0.5000 mg | Freq: Once | INTRAMUSCULAR | Status: AC
  Administered 2017-03-24: 0.5 mg via INTRAVENOUS
  Filled 2017-03-24: qty 1

## 2017-03-24 MED ORDER — ONDANSETRON HCL 4 MG PO TABS: 4.0000 mg | ORAL_TABLET | Freq: Three times a day (TID) | ORAL | 0 refills | Status: DC | PRN

## 2017-03-24 MED ORDER — FENTANYL CITRATE (PF) 100 MCG/2ML IJ SOLN
INTRAMUSCULAR | Status: AC
  Filled 2017-03-24: qty 2

## 2017-03-24 MED FILL — OXYCODONE-ACETAMINOPHEN 5-3: 5-325 | 3 days supply | Qty: 15 | Fill #0

## 2017-03-24 MED FILL — ONDANSETRON HCL 4 MG TABLET: 4 | 4 days supply | Qty: 12 | Fill #0

## 2017-03-24 NOTE — Discharge Instructions (Signed)
Your workup today showed urine backed up into your right kidney likely causing her symptoms. We did not see a stone causing the problem. We suspect it is your ureteral stenosis and spasm causing the symptoms. Urology felt that you are safe for discharge home based on your labs and exam. If your symptoms worsen, please return to the nearest emergency department however, please follow-up with your urology team for further management.

## 2017-03-24 NOTE — ED Triage Notes (Signed)
Right flank pain since this am, with nausea. Denies urinary s/s

## 2017-03-24 NOTE — ED Provider Notes (Signed)
[facility] DEPT [facility] Provider Note   CSN: [id number] Arrival date & time: 03/24/17  1025     History   Chief Complaint Chief Complaint  Patient presents with  . Flank Pain    HPI Miranda Mathis is a 66 y.o. female.  The history is provided by the patient, medical records and the spouse.  Flank Pain  This is a recurrent problem. The current episode started 6 to 12 hours ago. The problem occurs constantly. The problem has not changed since onset.Associated symptoms include abdominal pain. Pertinent negatives include no chest pain, no headaches and no shortness of breath. Nothing aggravates the symptoms. Nothing relieves the symptoms. She has tried nothing for the symptoms. The treatment provided no relief.    Past Medical History:  Diagnosis Date  . Allergic rhinitis, cause unspecified   . Barrett's esophagus   . Chronic back pain   . DDD (degenerative disc disease), lumbar   . Fibromyalgia   . GERD (gastroesophageal reflux disease)   . Headache(784.0)   . Hypothyroid 11/02/2013 dx  . IBS (irritable bowel syndrome)   . Stone, bladder   . Ureteral stricture, right     Patient Active Problem List   Diagnosis Date Noted  . Insomnia 10/25/2014  . Hypothyroid 11/02/2013  . Kidney stone   . Depression   . Other and unspecified hyperlipidemia 04/04/2012  . ALLERGIC RHINITIS 10/11/2007  . BACK PAIN, CHRONIC 10/11/2007  . FIBROMYALGIA 10/11/2007  . HEADACHE, CHRONIC 10/11/2007    Past Surgical History:  Procedure Laterality Date  . ABDOMINAL HYSTERECTOMY  1987  . BALLOON DILATION  07/10/2011   Procedure: BALLOON DILATION;  Surgeon: Fredricka Bonine, MD;  Location: [facility];  Service: Urology;  Laterality: Right;  . CESAREAN SECTION  X2  . CYSTO/ BILATERAL URETEROSCOPY / URETERAL BX'S/ BILATERAL URETERAL STENT PLACEMENT/ BLADDER STONE EXTRACTION  03-13-2011  . CYSTOSCOPY W/ RETROGRADES  07/10/2011   Procedure: CYSTOSCOPY WITH RETROGRADE  PYELOGRAM;  Surgeon: Fredricka Bonine, MD;  Location[facility];  Service: Urology;  Laterality: Right;  . CYSTOSCOPY W/ URETERAL STENT PLACEMENT  07/10/2011   Procedure: CYSTOSCOPY WITH STENT REPLACEMENT;  Surgeon: Fredricka Bonine, MD;  Location: [facility];  Service: Urology;  Laterality: Right;  . CYSTOSCOPY W/ URETERAL STENT REMOVAL  07/10/2011   Procedure: CYSTOSCOPY WITH STENT REMOVAL;  Surgeon: Fredricka Bonine, MD;  Location: [facility];  Service: Urology;  Laterality: Right;  . LUMBAR MICRODISCECTOMY  1990'S   L5 - S1  . RIGHT FOOT SURG  2003   HEEL  . RIGHT URETEROSCOPIC / URETERAL BX/ STENT PLACEMENT  04-17-2011    OB History    No data available       Home Medications    Prior to Admission medications   Medication Sig Start Date End Date Taking? Authorizing Provider  acetaminophen (TYLENOL) 500 MG tablet Take 1,000 mg by mouth every 4 (four) hours as needed. pain    Yes [provider]  betamethasone dipropionate 0.05 % lotion Apply 1 application topically daily as needed for itching. 12/02/16  Yes [provider]  cholecalciferol (VITAMIN D) 1000 units tablet Take 2,000 Units by mouth daily.   Yes [provider]  dicyclomine (BENTYL) 10 MG capsule Take 1 capsule (10 mg total) by mouth 3 (three) times daily before meals. Patient taking differently: Take 20-30 mg by mouth 3 (three) times daily before meals.  01/09/15  Yes Rowe Clack, MD  escitalopram (LEXAPRO) 20 MG tablet Take 20 mg by mouth daily.   Yes [provider]  esomeprazole (NEXIUM) 20 MG capsule Take 20 mg by mouth every evening.   Yes [provider]  ketoconazole (NIZORAL) 2 % shampoo Apply 1 application topically 2 (two) times a week. 05/29/16  Yes [provider]  levothyroxine (SYNTHROID, LEVOTHROID) 137 MCG tablet Take 137 mcg by mouth daily before breakfast.   Yes [provider]  meloxicam (MOBIC) 15 MG tablet TAKE 1 TABLET BY MOUTH EVERY DAY 05/13/15  Yes Rowe Clack, MD  phenazopyridine (PYRIDIUM) 95 MG tablet Take 95 mg by mouth 3 (three) times daily as needed for pain.   Yes [provider]  pravastatin (PRAVACHOL) 20 MG tablet TAKE 1 TABLET DAILY 12/31/15  Yes Burns, Claudina Lick, MD  senna-docusate (SENOKOT-S) 8.6-50 MG tablet Take 1-2 tablets by mouth every evening.   Yes [provider]  triamcinolone lotion (KENALOG) 0.1 % Apply topically 3 (three) times daily. Must establish with NEW PCP for additional refills. 05/30/15  Yes Rowe Clack, MD  zolpidem (AMBIEN) 10 MG tablet Take 1 tablet (10 mg total) by mouth at bedtime as needed. for sleep 07/29/15  Yes Burns, Claudina Lick, MD  HYDROcodone-acetaminophen (NORCO/VICODIN) 5-325 MG tablet Take 1-2 tablets by mouth every 6 (six) hours as needed for severe pain. 01/08/17   Duffy Bruce, MD  ketorolac (TORADOL) 10 MG tablet Take 1 tablet (10 mg total) by mouth every 8 (eight) hours as needed for severe pain (kidney stone). Patient not taking: Reported on 01/08/2017 01/09/15   Rowe Clack, MD  levothyroxine (SYNTHROID, LEVOTHROID) 100 MCG tablet TAKE 1 TABLET BY MOUTH EVERY DAY Patient not taking: Reported on 01/08/2017 05/13/15   Rowe Clack, MD  ondansetron (ZOFRAN ODT) 4 MG disintegrating tablet Take 1 tablet (4 mg total) by mouth every 8 (eight) hours as needed for nausea or vomiting. 01/08/17   Duffy Bruce, MD  oxyCODONE-acetaminophen (ROXICET) 5-325 MG per tablet Take 1 tablet by mouth every 8 (eight) hours as needed for pain. 04/04/12   Norins, Heinz Knuckles, MD    Family History Family History  Problem Relation Age of Onset  . COPD Mother   . Emphysema Mother   . Osteoarthritis Mother   . Rheum arthritis Mother   . Prostate cancer Father        w/mets  . Coronary artery disease Paternal Grandfather   . Diabetes Paternal Aunt        x 3  . Colon cancer Neg  Hx     Social History Social History  Substance Use Topics  . Smoking status: Never Smoker  . Smokeless tobacco: Never Used  . Alcohol use 0.6 oz/week    1 Standard drinks or equivalent per week     Comment: rarely     Allergies   Morphine   Review of Systems Review of Systems  Constitutional: Negative for chills, diaphoresis, fatigue and fever.  HENT: Negative for congestion and rhinorrhea.   Eyes: Negative for visual disturbance.  Respiratory: Negative for chest tightness, shortness of breath, wheezing and stridor.   Cardiovascular: Negative for chest pain and palpitations.  Gastrointestinal: Positive for abdominal pain and nausea. Negative for diarrhea and vomiting.  Genitourinary: Positive for flank pain. Negative for dysuria and enuresis.  Musculoskeletal: Negative for back pain, neck pain and neck stiffness.  Skin: Negative for rash and wound.  Neurological: Negative for weakness, light-headedness, numbness and headaches.  Psychiatric/Behavioral: Negative  for agitation.  All other systems reviewed and are negative.    Physical Exam Updated Vital Signs BP (!) 164/72 (BP Location: Right Arm)   Pulse 70   Temp 98.4 F (36.9 C) (Oral)   Resp (!) 24   Ht 5\' 2"  (1.575 m)   Wt 99.8 kg (220 lb)   SpO2 96%   BMI 40.24 kg/m   Physical Exam  Constitutional: She is oriented to person, place, and time. She appears well-developed and well-nourished. No distress.  HENT:  Head: Normocephalic and atraumatic.  Mouth/Throat: Oropharynx is clear and moist. No oropharyngeal exudate.  Eyes: Pupils are equal, round, and reactive to light. Conjunctivae and EOM are normal.  Neck: Normal range of motion. Neck supple.  Cardiovascular: Normal rate and regular rhythm.   No murmur heard. Pulmonary/Chest: Effort normal and breath sounds normal. No stridor. No respiratory distress. She has no wheezes. She exhibits no tenderness.  Abdominal: Soft. Normal appearance. There is no  tenderness. There is CVA tenderness. There is no rigidity, no rebound and no guarding.    Musculoskeletal: She exhibits tenderness. She exhibits no edema.       Thoracic back: She exhibits tenderness and pain.       Back:  Neurological: She is alert and oriented to person, place, and time. No cranial nerve deficit or sensory deficit. She exhibits normal muscle tone.  Skin: Skin is warm and dry. Capillary refill takes less than 2 seconds. No rash noted. She is not diaphoretic. No erythema.  Psychiatric: She has a normal mood and affect.  Nursing note and vitals reviewed.    ED Treatments / Results  Labs (all labs ordered are listed, but only abnormal results are displayed) Labs Reviewed  URINALYSIS, ROUTINE W REFLEX MICROSCOPIC - Abnormal; Notable for the following:       Result Value   Hgb urine dipstick MODERATE (*)    Protein, ur 100 (*)    All other components within normal limits  COMPREHENSIVE METABOLIC PANEL - Abnormal; Notable for the following:    Glucose, Bld 161 (*)    Calcium 8.6 (*)    All other components within normal limits  URINALYSIS, MICROSCOPIC (REFLEX) - Abnormal; Notable for the following:    Bacteria, UA FEW (*)    Squamous Epithelial / LPF 0-5 (*)    All other components within normal limits  URINE CULTURE  CBC WITH DIFFERENTIAL/PLATELET  LIPASE, BLOOD    EKG  EKG Interpretation None       Radiology Ct Renal Stone Study  Result Date: 03/24/2017 CLINICAL DATA:  Right flank pain today. Microscopic hematuria. Nausea and vomiting. EXAM: CT ABDOMEN AND PELVIS WITHOUT CONTRAST TECHNIQUE: Multidetector CT imaging of the abdomen and pelvis was performed following the standard protocol without IV contrast. COMPARISON:  01/08/2017 FINDINGS: Lower chest: Dependent atelectasis. Hepatobiliary: Diffuse hepatic steatosis.  Postcholecystectomy. Pancreas: Unremarkable Spleen: Unremarkable Adrenals/Urinary Tract: Right hydronephrosis is worse. Right perinephric  stranding is also noted. The right renal pelvis is dilated. The right ureter is low relatively nondilated. Left kidney and adrenal glands are within normal limits. Bladder is decompressed. No ureteral calculus. Stomach/Bowel: Small gastric diverticulum at the posterior cardia. Duodenum is unremarkable. No evidence of small-bowel obstruction. No focal mass of the colon. Appendix is not clearly visualized. Vascular/Lymphatic: Minimal atherosclerotic calcification in the aorta and iliac vasculature. No abnormal adenopathy. Reproductive: Post hysterectomy.  Adnexa are within normal limits. Other: No free-fluid. Musculoskeletal: No vertebral compression deformity. Severe narrowing of the L4-5 and L5-S1 discs.  IMPRESSION: Right hydronephrosis and perinephric stranding are worse without a visualized ureteral calculus. Findings may represent a recently passed calculus, however an element of UPJ obstruction cannot be excluded. Nuclear medicine Lasix renogram may be helpful. Aortic atherosclerosis. Electronically Signed   By: Marybelle Killings M.D.   On: 03/24/2017 12:25    Procedures Procedures (including critical care time)  Medications Ordered in ED Medications  fentaNYL (SUBLIMAZE) injection 50 mcg (50 mcg Intravenous Given 03/24/17 1144)  ondansetron (ZOFRAN) injection 4 mg (4 mg Intravenous Given 03/24/17 1144)  sodium chloride 0.9 % bolus 1,000 mL (0 mLs Intravenous Stopped 03/24/17 1547)  ketorolac (TORADOL) 30 MG/ML injection 30 mg (30 mg Intravenous Given 03/24/17 1225)  HYDROmorphone (DILAUDID) injection 0.5 mg (0.5 mg Intravenous Given 03/24/17 1228)     Initial Impression / Assessment and Plan / ED Course  I have reviewed the triage vital signs and the nursing notes.  Pertinent labs & imaging results that were available during my care of the patient were reviewed by me and considered in my medical decision making (see chart for details).     Miranda Mathis is a 66 y.o. female with a past medical  history significant for kidney stones, GERD, hypothyroidism, and prior right-sided ureteral stricture status post multiple stentings who presents with intermittent right-sided flank pain and nausea. Patient says that for the last month, twice a week she has had severe right-sided symptoms. Normally she is able to manage him at home however, she says today a been persistent and extremely severe. She is tearful and describes her pain as a 10 out of 10 in severity. Patient is unable to comfortable on the bed and is clutching her right side. She says this feels similar to when she had the kidney stones in the past that required stenting. She reports nausea but no vomiting. She denies dysuria. She denies any conservation, diarrhea, fevers, or chills. She denies any recent traumatic injuries. She denies any respiratory symptoms. She says that pain medicine at home have not helped her symptoms.  On exam, patient has tenderness in the right CVA area and right flank. No anterior abdominal tenderness. Patient denies any pelvic symptoms. Patient's lungs are clear and chest is nontender. Patient writhing in pain on exam.  Patient quickly given fentanyl for pain management. Patient will have workup to look for abnormality however urinalysis showed hematuria, suspect recurrent kidney stone.   To return for scanner, patient continued to be in severe pain. Patient will be given Toradol given her normal creatinine as well as Dilaudid. Anticipate following up on lab and imaging testing.  Patient's diagnostic imaging revealed evidence of right-sided hydronephrosis but no evidence of stone causing the obstruction. Laboratory testing showed no evidence of kidney dysfunction. CBC reassuring. Urinalysis did not show infection. Doubt UTI.  Urology was called given concern for a ureteral stenosis causing the hydronephrosis and symptoms. They requested patient be given pain medications and reassessed. Patient felt better after  Dilaudid and Toradol. Urology recommended follow-up with the patient's primary urologist, Dr. Junious Silk in several days. If patient's symptoms do not improve, patient will need to be transferred to [facility] for urgent stent placement. Next  On reassessment, patient's pain improved. Patient felt comfortable and safe going home. Patient understands return precautions for any new or worsened symptoms. Patient had other questions or concerns and was discharged in good condition with prescription of pain and nausea medicine as well as understanding of plan of care.    Final Clinical Impressions(s) /  ED Diagnoses   Final diagnoses:  Hydronephrosis of right kidney  Nausea  Right flank pain    New Prescriptions Discharge Medication List as of 03/24/2017  3:39 PM    START taking these medications   Details  ondansetron (ZOFRAN) 4 MG tablet Take 1 tablet (4 mg total) by mouth every 8 (eight) hours as needed for nausea or vomiting., Starting Wed 03/24/2017, Print    !! oxyCODONE-acetaminophen (PERCOCET/ROXICET) 5-325 MG tablet Take 1 tablet by mouth every 4 (four) hours as needed for severe pain., Starting Wed 03/24/2017, Print     !! - Potential duplicate medications found. Please discuss with provider.      Clinical Impression: 1. Hydronephrosis of right kidney   2. Nausea   3. Right flank pain     Disposition: Discharge  Condition: Good  I have discussed the results, Dx and Tx plan with the pt(& family if present). He/she/they expressed understanding and agree(s) with the plan. Discharge instructions discussed at great length. Strict return precautions discussed and pt &/or family have verbalized understanding of the instructions. No further questions at time of discharge.    Discharge Medication List as of 03/24/2017  3:39 PM    START taking these medications   Details  ondansetron (ZOFRAN) 4 MG tablet Take 1 tablet (4 mg total) by mouth every 8 (eight) hours as needed for nausea  or vomiting., Starting Wed 03/24/2017, Print    !! oxyCODONE-acetaminophen (PERCOCET/ROXICET) 5-325 MG tablet Take 1 tablet by mouth every 4 (four) hours as needed for severe pain., Starting Wed 03/24/2017, Print     !! - Potential duplicate medications found. Please discuss with provider.      Follow Up: Festus Aloe, MD [place] [address] [phone]  Schedule an appointment as soon as possible for a visit in 3 days   [place] [address] [id number] mc [place] [phone]  If symptoms worsen     Nesanel Aguila, Gwenyth Allegra, MD 03/24/17 [phone]

## 2017-03-25 DIAGNOSIS — N1330 Unspecified hydronephrosis: Secondary | ICD-10-CM | POA: Diagnosis not present

## 2017-03-25 DIAGNOSIS — N130 Hydronephrosis with ureteropelvic junction obstruction: Secondary | ICD-10-CM | POA: Diagnosis not present

## 2017-03-25 LAB — URINE CULTURE

## 2017-04-09 ENCOUNTER — Other Ambulatory Visit: Payer: Self-pay

## 2017-04-09 DIAGNOSIS — N1339 Other hydronephrosis: Secondary | ICD-10-CM | POA: Diagnosis not present

## 2017-04-09 DIAGNOSIS — N1330 Unspecified hydronephrosis: Secondary | ICD-10-CM | POA: Diagnosis not present

## 2017-04-09 DIAGNOSIS — D4121 Neoplasm of uncertain behavior of right ureter: Secondary | ICD-10-CM | POA: Diagnosis not present

## 2017-04-09 DIAGNOSIS — R896 Abnormal cytological findings in specimens from other organs, systems and tissues: Secondary | ICD-10-CM | POA: Diagnosis not present

## 2017-04-27 DIAGNOSIS — N131 Hydronephrosis with ureteral stricture, not elsewhere classified: Secondary | ICD-10-CM | POA: Diagnosis not present

## 2017-05-05 DIAGNOSIS — K2270 Barrett's esophagus without dysplasia: Secondary | ICD-10-CM | POA: Diagnosis not present

## 2017-05-05 DIAGNOSIS — K5730 Diverticulosis of large intestine without perforation or abscess without bleeding: Secondary | ICD-10-CM | POA: Diagnosis not present

## 2017-05-05 DIAGNOSIS — D123 Benign neoplasm of transverse colon: Secondary | ICD-10-CM | POA: Diagnosis not present

## 2017-05-05 DIAGNOSIS — K642 Third degree hemorrhoids: Secondary | ICD-10-CM | POA: Diagnosis not present

## 2017-05-05 DIAGNOSIS — D120 Benign neoplasm of cecum: Secondary | ICD-10-CM | POA: Diagnosis not present

## 2017-05-05 DIAGNOSIS — D122 Benign neoplasm of ascending colon: Secondary | ICD-10-CM | POA: Diagnosis not present

## 2017-05-05 DIAGNOSIS — Z1211 Encounter for screening for malignant neoplasm of colon: Secondary | ICD-10-CM | POA: Diagnosis not present

## 2017-08-13 DIAGNOSIS — R109 Unspecified abdominal pain: Secondary | ICD-10-CM | POA: Diagnosis not present

## 2017-08-13 DIAGNOSIS — N200 Calculus of kidney: Secondary | ICD-10-CM | POA: Diagnosis not present

## 2017-08-13 DIAGNOSIS — R319 Hematuria, unspecified: Secondary | ICD-10-CM | POA: Diagnosis not present

## 2017-08-27 DIAGNOSIS — E119 Type 2 diabetes mellitus without complications: Secondary | ICD-10-CM | POA: Diagnosis not present

## 2017-08-27 DIAGNOSIS — E039 Hypothyroidism, unspecified: Secondary | ICD-10-CM | POA: Diagnosis not present

## 2017-08-27 DIAGNOSIS — N200 Calculus of kidney: Secondary | ICD-10-CM | POA: Diagnosis not present

## 2017-08-27 DIAGNOSIS — R109 Unspecified abdominal pain: Secondary | ICD-10-CM | POA: Diagnosis not present

## 2017-08-27 DIAGNOSIS — E782 Mixed hyperlipidemia: Secondary | ICD-10-CM | POA: Diagnosis not present

## 2017-08-27 LAB — LIPID PANEL
Cholesterol: 166 (ref 0–200)
HDL: 35 (ref 35–70)
LDL CALC: 112
LDL Cholesterol: 112
Triglycerides: 96 (ref 40–160)

## 2017-08-27 LAB — BASIC METABOLIC PANEL
BUN: 17 (ref 4–21)
Creatinine: 0.8 (ref <=1.1)
GLUCOSE: 116
POTASSIUM: 4.3 (ref 3.4–5.3)
Sodium: 141 (ref 137–147)

## 2017-08-27 LAB — CBC AND DIFFERENTIAL
HEMATOCRIT: 41 (ref 36–46)
Hemoglobin: 13.6 (ref 12.0–16.0)
Platelets: 101 — AB (ref 150–399)
WBC: 6.2

## 2017-08-27 LAB — HEPATIC FUNCTION PANEL
ALT: 28 (ref 7–35)
AST: 25 (ref 13–35)

## 2017-08-27 LAB — HEMOGLOBIN A1C: Hemoglobin A1C: 6.8

## 2017-08-27 LAB — MICROALBUMIN, URINE: Microalb, Ur: 90.1

## 2017-08-27 LAB — TSH: TSH: 3.64 (ref <=5.90)

## 2017-09-28 DIAGNOSIS — Z0000 Encounter for general adult medical examination without abnormal findings: Secondary | ICD-10-CM | POA: Diagnosis not present

## 2017-09-28 DIAGNOSIS — F331 Major depressive disorder, recurrent, moderate: Secondary | ICD-10-CM | POA: Diagnosis not present

## 2017-09-28 DIAGNOSIS — M47816 Spondylosis without myelopathy or radiculopathy, lumbar region: Secondary | ICD-10-CM | POA: Diagnosis not present

## 2017-09-28 DIAGNOSIS — E2839 Other primary ovarian failure: Secondary | ICD-10-CM | POA: Diagnosis not present

## 2017-09-28 DIAGNOSIS — L409 Psoriasis, unspecified: Secondary | ICD-10-CM | POA: Diagnosis not present

## 2017-09-28 DIAGNOSIS — B372 Candidiasis of skin and nail: Secondary | ICD-10-CM | POA: Diagnosis not present

## 2017-09-28 DIAGNOSIS — F5101 Primary insomnia: Secondary | ICD-10-CM | POA: Diagnosis not present

## 2017-09-28 DIAGNOSIS — E782 Mixed hyperlipidemia: Secondary | ICD-10-CM | POA: Diagnosis not present

## 2017-09-28 DIAGNOSIS — E039 Hypothyroidism, unspecified: Secondary | ICD-10-CM | POA: Diagnosis not present

## 2017-09-28 DIAGNOSIS — Z1231 Encounter for screening mammogram for malignant neoplasm of breast: Secondary | ICD-10-CM | POA: Diagnosis not present

## 2017-10-19 ENCOUNTER — Other Ambulatory Visit: Payer: Self-pay | Admitting: Physician Assistant

## 2017-10-19 DIAGNOSIS — E2839 Other primary ovarian failure: Secondary | ICD-10-CM

## 2017-10-19 DIAGNOSIS — Z139 Encounter for screening, unspecified: Secondary | ICD-10-CM

## 2017-12-02 ENCOUNTER — Other Ambulatory Visit: Payer: Self-pay

## 2017-12-02 ENCOUNTER — Encounter: Payer: Self-pay | Admitting: Emergency Medicine

## 2017-12-02 ENCOUNTER — Encounter: Payer: Self-pay | Admitting: Family Medicine

## 2017-12-02 ENCOUNTER — Ambulatory Visit (INDEPENDENT_AMBULATORY_CARE_PROVIDER_SITE_OTHER): Payer: Medicare Other | Admitting: Family Medicine

## 2017-12-02 VITALS — BP 122/82 | HR 72 | Temp 98.6°F | Resp 16 | Ht 63.0 in | Wt 217.4 lb

## 2017-12-02 DIAGNOSIS — G8929 Other chronic pain: Secondary | ICD-10-CM

## 2017-12-02 DIAGNOSIS — E039 Hypothyroidism, unspecified: Secondary | ICD-10-CM

## 2017-12-02 DIAGNOSIS — E119 Type 2 diabetes mellitus without complications: Secondary | ICD-10-CM

## 2017-12-02 DIAGNOSIS — E782 Mixed hyperlipidemia: Secondary | ICD-10-CM | POA: Diagnosis not present

## 2017-12-02 DIAGNOSIS — L409 Psoriasis, unspecified: Secondary | ICD-10-CM | POA: Diagnosis not present

## 2017-12-02 DIAGNOSIS — N200 Calculus of kidney: Secondary | ICD-10-CM | POA: Diagnosis not present

## 2017-12-02 DIAGNOSIS — L219 Seborrheic dermatitis, unspecified: Secondary | ICD-10-CM | POA: Insufficient documentation

## 2017-12-02 DIAGNOSIS — M545 Low back pain: Secondary | ICD-10-CM

## 2017-12-02 DIAGNOSIS — M797 Fibromyalgia: Secondary | ICD-10-CM | POA: Diagnosis not present

## 2017-12-02 LAB — POCT GLYCOSYLATED HEMOGLOBIN (HGB A1C): HEMOGLOBIN A1C: 6.7 % — AB (ref 4.0–5.6)

## 2017-12-02 MED ORDER — LEVOTHYROXINE SODIUM 137 MCG PO TABS: 137.0000 ug | ORAL_TABLET | Freq: Every day | ORAL | 3 refills | Status: DC

## 2017-12-02 MED ORDER — BETAMETHASONE DIPROPIONATE 0.05 % EX LOTN: 1.0000 "application " | TOPICAL_LOTION | Freq: Every day | CUTANEOUS | 2 refills | Status: DC | PRN

## 2017-12-02 MED ORDER — ESCITALOPRAM OXALATE 20 MG PO TABS: 20.0000 mg | ORAL_TABLET | Freq: Every day | ORAL | 3 refills | Status: DC

## 2017-12-02 MED ORDER — NYSTATIN 100000 UNIT/GM EX POWD: Freq: Three times a day (TID) | CUTANEOUS | 3 refills | Status: DC | PRN

## 2017-12-02 MED ORDER — ESOMEPRAZOLE MAGNESIUM 40 MG PO CPDR: 40.0000 mg | DELAYED_RELEASE_CAPSULE | Freq: Every day | ORAL | 3 refills | Status: DC

## 2017-12-02 MED ORDER — MELOXICAM 15 MG PO TABS
15.0000 mg | ORAL_TABLET | Freq: Every day | ORAL | 3 refills | Status: DC
Start: 2017-12-02 — End: 2018-10-23

## 2017-12-02 NOTE — Progress Notes (Signed)
Subjective  CC:  Chief Complaint  Patient presents with  . Establish Care    Former patient of Dr. Jonni Sanger at [facility], patient stated she had a physical last month    HPI: Miranda Mathis is a 67 y.o. female is a former [facility] patient and is here to reestablish care with me today.   I reviewed notes from care everywhere.  Had annual physical in March with lab work reviewed.  Lipids, thyroid, diabetes, renal electrolyte function was all at goal.  Annual wellness visit done in April.  She has the following concerns or needs:  Diet-controlled diabetes: Doing well.  Tries eat a good diet.  No symptoms of hyper or hypoglycemia.  She does have mild peripheral neuropathy symptoms but they are intermittent and not terribly bothersome.  No foot sores.  Immunizations are up-to-date.  She is doing eye exam and will schedule.  Hyperlipidemia on statin at goal.  Tolerating statins.  Acquired hypothyroidism with normal energy levels.  Recent TSH was at goal.  Requests refill  Chronic low back pain: She took a fall several months ago and had increasing back pain however things have improved.  Takes daily Mobic.  No longer on narcotics.  History of recurrent kidney stones: Uses Toradol acutely when she has them.  Seborrheic dermatitis of scalp: Needs refill of steroid lotion.  Also with new psoriatic patch on back.  Some itching.  Assessment  1. Controlled type 2 diabetes mellitus without complication, without long-term current use of insulin ([place])   2. Mixed hyperlipidemia   3. Acquired hypothyroidism   4. Fibromyalgia   5. Chronic midline low back pain without sciatica   6. Recurrent kidney stones   7. Seborrheic dermatitis of scalp   8. Psoriasis      Plan   Chronic medical problems are well controlled.  Continue diabetic diet.  No change in medications today multiple medications refilled.  Add clobetasol for psoriasis  Follow up:  Return in about 6 months (around 06/03/2018) for follow up of  diabetes and hypertension.  Orders Placed This Encounter  Procedures  . POCT glycosylated hemoglobin (Hb A1C)   Meds ordered this encounter  Medications  . nystatin (MYCOSTATIN/NYSTOP) powder    Sig: Apply topically 3 (three) times daily as needed.    Dispense:  30 g    Refill:  3  . betamethasone dipropionate 0.05 % lotion    Sig: Apply 1 application topically daily as needed.    Dispense:  60 mL    Refill:  2  . meloxicam (MOBIC) 15 MG tablet    Sig: Take 1 tablet (15 mg total) by mouth daily.    Dispense:  90 tablet    Refill:  3  . esomeprazole (NEXIUM) 40 MG capsule    Sig: Take 1 capsule (40 mg total) by mouth daily.    Dispense:  90 capsule    Refill:  3  . escitalopram (LEXAPRO) 20 MG tablet    Sig: Take 1 tablet (20 mg total) by mouth daily.    Dispense:  90 tablet    Refill:  3  . levothyroxine (SYNTHROID, LEVOTHROID) 137 MCG tablet    Sig: Take 1 tablet (137 mcg total) by mouth daily before breakfast.    Dispense:  90 tablet    Refill:  3      We updated and reviewed the patient's past history in detail and it is documented below.  Patient Active Problem List   Diagnosis Date  Noted  . Seborrheic dermatitis of scalp 12/02/2017  . Psoriasis 12/02/2017  . Controlled type 2 diabetes mellitus without complication, without long-term current use of insulin ([place]) 09/02/2016  . Recurrent kidney stones 08/29/2015    Overview:  S/p stents, multiple, [facility], Dr. Linton Ham   . Barrett's esophagus with esophagitis 08/29/2015    Last EGD 2018, no evidence of Barrett's esophagus, no further EGD recommended   . GAD (generalized anxiety disorder) 08/29/2015  . GERD (gastroesophageal reflux disease) 08/29/2015  . Irritable bowel syndrome with constipation 08/29/2015  . Spondylosis of lumbar region without myelopathy or radiculopathy 08/29/2015  . Primary insomnia 10/25/2014    Overview:  Last Assessment & Plan:  Insomnia is stable with current regimen of Ambien.  Continue current dosage of Ambien.   . Acquired hypothyroidism 11/02/2013  . Mixed hyperlipidemia 04/04/2012  . Allergic rhinitis 10/11/2007    Qualifier: Diagnosis of  By: Tiney Rouge CMA, Ellison Hughs     . Fibromyalgia 10/11/2007    Qualifier: Diagnosis of  By: Tiney Rouge CMA, Ellison Hughs     . Chronic low back pain 10/11/2007   Health Maintenance  Topic Date Due  . OPHTHALMOLOGY EXAM  05/26/1961  . MAMMOGRAM  12/22/2017  . INFLUENZA VACCINE  01/20/2018  . HEMOGLOBIN A1C  02/27/2018  . URINE MICROALBUMIN  08/28/2018  . FOOT EXAM  12/03/2018  . TETANUS/TDAP  11/12/2020  . COLONOSCOPY  06/23/2027  . DEXA SCAN  Completed  . Hepatitis C Screening  Completed  . PNA vac Low Risk Adult  Completed   Immunization History  Administered Date(s) Administered  . Influenza Whole 03/22/2001, 03/22/2006, 03/22/2010  . Influenza, High Dose Seasonal PF 02/19/2016  . Influenza, Seasonal, Injecte, Preservative Fre 06/27/2012  . Pneumococcal Conjugate-13 11/27/2015  . Pneumococcal Polysaccharide-23 11/13/2010  . Tdap 11/13/2010  . Zoster 11/27/2015   Current Meds  Medication Sig  . acetaminophen (TYLENOL) 500 MG tablet Take 1,000 mg by mouth every 4 (four) hours as needed. pain   . clotrimazole-betamethasone (LOTRISONE) cream APPLY TO AFFECTED AREA TWICE A DAY  . escitalopram (LEXAPRO) 20 MG tablet Take 1 tablet (20 mg total) by mouth daily.  [name] ketoconazole (NIZORAL) 2 % shampoo Apply 1 application topically 2 (two) times a week.  . levothyroxine (SYNTHROID, LEVOTHROID) 137 MCG tablet Take 1 tablet (137 mcg total) by mouth daily before breakfast.  . meloxicam (MOBIC) 15 MG tablet Take 1 tablet (15 mg total) by mouth daily.  [name] nystatin (MYCOSTATIN/NYSTOP) powder Apply topically 3 (three) times daily as needed.  . pravastatin (PRAVACHOL) 40 MG tablet TAKE 1 TABLET DAILY  . senna-docusate (SENOKOT-S) 8.6-50 MG tablet Take 1-2 tablets by mouth every evening.  . zolpidem (AMBIEN) 10 MG tablet Take 1 tablet  (10 mg total) by mouth at bedtime as needed. for sleep  . [DISCONTINUED] escitalopram (LEXAPRO) 20 MG tablet Take 20 mg by mouth daily.  . [DISCONTINUED] levothyroxine (SYNTHROID, LEVOTHROID) 137 MCG tablet Take 137 mcg by mouth daily before breakfast.  . [DISCONTINUED] meloxicam (MOBIC) 15 MG tablet TAKE 1 TABLET BY MOUTH EVERY DAY  . [DISCONTINUED] nystatin (MYCOSTATIN/NYSTOP) powder Apply to affected area 3 times daily    Allergies: Patient is allergic to morphine. Past Medical History Patient  has a past medical history of Allergic rhinitis, cause unspecified, Barrett's esophagus, Chicken pox, Chronic back pain, Chronic kidney disease, Colon polyps, DDD (degenerative disc disease), lumbar, Depression, Fibromyalgia, GERD (gastroesophageal reflux disease), Headache(784.0), Hyperlipidemia, Hypothyroid (11/02/2013 dx), IBS (irritable bowel syndrome), Stone, bladder, and Ureteral stricture, right. Past  Surgical History Patient  has a past surgical history that includes Cesarean section (X2); CYSTO/ BILATERAL URETEROSCOPY / URETERAL BX'S/ BILATERAL URETERAL STENT PLACEMENT/ BLADDER STONE EXTRACTION (03-13-2011); RIGHT URETEROSCOPIC / URETERAL BX/ STENT PLACEMENT (04-17-2011); RIGHT FOOT SURG (2003); Lumbar microdiscectomy (1990'S); Abdominal hysterectomy (1987); Cystoscopy w/ retrogrades (07/10/2011); Balloon dilation (07/10/2011); Cystoscopy w/ ureteral stent removal (07/10/2011); and Cystoscopy w/ ureteral stent placement (07/10/2011). Family History: Patient family history includes COPD in her mother; Coronary artery disease in her paternal grandfather; Diabetes in her paternal aunt; Emphysema in her mother; Osteoarthritis in her mother; Prostate cancer in her father; Rheum arthritis in her mother. Social History:  Patient  reports that she has never smoked. She has never used smokeless tobacco. She reports that she drinks about 0.6 oz of alcohol per week. She reports that she does not use  drugs.  Review of Systems: Constitutional: negative for fever or malaise Ophthalmic: negative for photophobia, double vision or loss of vision Cardiovascular: negative for chest pain, dyspnea on exertion, or new LE swelling Respiratory: negative for SOB or persistent cough Gastrointestinal: negative for abdominal pain, change in bowel habits or melena Genitourinary: negative for dysuria or gross hematuria Musculoskeletal: negative for new gait disturbance or muscular weakness Integumentary: negative for new or persistent rashes Neurological: negative for TIA or stroke symptoms Psychiatric: negative for SI or delusions Allergic/Immunologic: negative for hives  Patient Care Team    Relationship Specialty Notifications Start End  Leamon Arnt, MD PCP - General Family Medicine  12/02/17   Festus Aloe, MD  Urology  10/06/13   Gatha Mayer, MD  Gastroenterology  10/06/13   Curt Jews, OD  Optometry  12/02/17     Objective  Vitals: BP 122/82   Pulse 72   Temp 98.6 F (37 C) (Oral)   Resp 16   Ht 5\' 3"  (1.6 m)   Wt 217 lb 6.4 oz (98.6 kg)   SpO2 95%   BMI 38.51 kg/m  General:  Well developed, well nourished, no acute distress  Psych:  Alert and oriented,normal mood and affect HEENT:  Normocephalic, atraumatic, non-icteric sclera, PERRL, oropharynx is without mass or exudate, supple neck without adenopathy, mass or thyromegaly Cardiovascular:  RRR without gallop, rub or murmur, nondisplaced PMI Respiratory:  Good breath sounds bilaterally, CTAB with normal respiratory effort Gastrointestinal: normal bowel sounds, soft, non-tender, no noted masses. No HSM MSK: no deformities, contusions. Joints are without erythema or swelling Skin:  Warm, large psoriatic patch lower back Diabetic Foot Exam: Appearance - no lesions, ulcers or calluses Skin - no sigificant pallor or erythema Monofilament testing - sensitive bilaterally in following locations:  Right - Great toe,  medial, central, lateral ball and posterior foot intact  Left - Great toe, medial, central, lateral ball and posterior foot intact Pulses - +2 distally bilaterally Neurologic:    Mental status is normal. Gross motor and sensory exams are normal. Normal gait  Abstract on 12/02/2017  Component Date Value Ref Range Status  . Microalb, Ur 08/27/2017 Urine Albumin 90.1 Alb/Creatine Ratio: 54.1   Final  . Triglycerides 08/27/2017 96  40 - 160 Final  . Cholesterol 08/27/2017 166  0 - 200 Final  . HDL 08/27/2017 35  35 - 70 Final  . LDL Cholesterol 08/27/2017 112   Final  . Hemoglobin A1C 08/27/2017 6.8   Final  . TSH 08/27/2017 3.64  0.41 - 5.90 Final  Office Visit on 12/02/2017  Component Date Value Ref Range Status  . Hemoglobin A1C 12/02/2017  6.7* 4.0 - 5.6 % Final     Commons side effects, risks, benefits, and alternatives for medications and treatment plan prescribed today were discussed, and the patient expressed understanding of the given instructions. Patient is instructed to call or message via MyChart if he/she has any questions or concerns regarding our treatment plan. No barriers to understanding were identified. We discussed Red Flag symptoms and signs in detail. Patient expressed understanding regarding what to do in case of urgent or emergency type symptoms.   Medication list was reconciled, printed and provided to the patient in AVS. Patient instructions and summary information was reviewed with the patient as documented in the AVS. This note was prepared with assistance of Dragon voice recognition software. Occasional wrong-word or sound-a-like substitutions may have occurred due to the inherent limitations of voice recognition software

## 2017-12-02 NOTE — Patient Instructions (Addendum)
It was so good seeing you again! Thank you for establishing with my new practice and allowing me to continue caring for you. It means a lot to me.   Please schedule a follow up appointment with me in 6 months for diabetes and blood pressure recheck.  Please schedule a diabetic eye exam with Dr. Laban Emperor soon. Have records sent to me.   Type 2 Diabetes Mellitus, Self Care, Adult When you have type 2 diabetes (type 2 diabetes mellitus), you must keep your blood sugar (glucose) under control. You can do this with:  Nutrition.  Exercise.  Lifestyle changes.  Medicines or insulin, if needed.  Support from your doctors and others.  How do I manage my blood sugar?  Check your blood sugar level every day, as often as told.  Call your doctor if your blood sugar is above your goal numbers for 2 tests in a row.  Have your A1c (hemoglobin A1c) level checked at least two times a year. Have it checked more often if your doctor tells you to. Your doctor will set treatment goals for you. Generally, you should have these blood sugar levels:  Before meals (preprandial): 80-130 mg/dL (4.4-7.2 mmol/L).  After meals (postprandial): lower than 180 mg/dL (10 mmol/L).  A1c level: less than 7%.  What do I need to know about high blood sugar? High blood sugar is called hyperglycemia. Know the signs of high blood sugar. Signs may include:  Feeling: ? Thirsty. ? Hungry. ? Very tired.  Needing to pee (urinate) more than usual.  Blurry vision.  What do I need to know about low blood sugar? Low blood sugar is called hypoglycemia. This is when blood sugar is at or below 70 mg/dL (3.9 mmol/L). Symptoms may include:  Feeling: ? Hungry. ? Worried or nervous (anxious). ? Sweaty and clammy. ? Confused. ? Dizzy. ? Sleepy. ? Sick to your stomach (nauseous).  Having: ? A fast heartbeat (palpitations). ? A headache. ? A change in your vision. ? Jerky movements that you cannot control  (seizure). ? Nightmares. ? Tingling or no feeling (numbness) around the mouth, lips, or tongue.  Having trouble with: ? Talking. ? Paying attention (concentrating). ? Moving (coordination). ? Sleeping.  Shaking.  Passing out (fainting).  Getting upset easily (irritability).  Treating low blood sugar  To treat low blood sugar, eat or drink something sugary right away. If you can think clearly and swallow safely, follow the 15:15 rule:  Take 15 grams of a fast-acting carb (carbohydrate). Some fast-acting carbs are: ? 1 tube of glucose gel. ? 3 sugar tablets (glucose pills). ? 6-8 pieces of hard candy. ? 4 oz (120 mL) of fruit juice. ? 4 oz (120 mL) regular (not diet) soda.  Check your blood sugar 15 minutes after you take the carb.  If your blood sugar is still at or below 70 mg/dL (3.9 mmol/L), take 15 grams of a carb again.  If your blood sugar does not go above 70 mg/dL (3.9 mmol/L) after 3 tries, get help right away.  After your blood sugar goes back to normal, eat a meal or a snack within 1 hour.  Treating very low blood sugar If your blood sugar is at or below 54 mg/dL (3 mmol/L), you have very low blood sugar (severe hypoglycemia). This is an emergency. Do not wait to see if the symptoms will go away. Get medical help right away. Call your local emergency services (911 in the [place] Do not drive yourself to  the hospital. If you have very low blood sugar and you cannot eat or drink, you may need a glucagon shot (injection). A family member or friend should learn how to check your blood sugar and how to give you a glucagon shot. Ask your doctor if you need to have a glucagon shot kit at home. What else is important to manage my diabetes? Medicine Follow these instructions about insulin and diabetes medicines:  Take them as told by your doctor.  Adjust them as told by your doctor.  Do not run out of them.  Having diabetes can raise your risk for other long-term  conditions. These include heart or kidney disease. Your doctor may prescribe medicines to help prevent problems from diabetes. Food   Make healthy food choices. These include: ? Chicken, fish, egg whites, and beans. ? Oats, whole wheat, bulgur, brown rice, quinoa, and millet. ? Fresh fruits and vegetables. ? Low-fat dairy products. ? Nuts, avocado, olive oil, and canola oil.  Make a food plan with a specialist (dietitian).  Follow instructions from your doctor about what you cannot eat or drink.  Drink enough fluid to keep your pee (urine) clear or pale yellow.  Eat healthy snacks between healthy meals.  Keep track of carbs that you eat. Read food labels. Learn food serving sizes.  Follow your sick day plan when you cannot eat or drink normally. Make this plan with your doctor so it is ready to use. Activity  Exercise at least 3 times a week.  Do not go more than 2 days without exercising.  Talk with your doctor before you start a new exercise. Your doctor may need to adjust your insulin, medicines, or food. Lifestyle   Do not use any tobacco products. These include cigarettes, chewing tobacco, and e-cigarettes.If you need help quitting, ask your doctor.  Ask your doctor how much alcohol is safe for you.  Learn to deal with stress. If you need help with this, ask your doctor. Body care  Stay up to date with your shots (immunizations).  Have your eyes and feet checked by a doctor as often as told.  Check your skin and feet every day. Check for cuts, bruises, redness, blisters, or sores.  Brush your teeth and gums two times a day.  Floss at least one time a day.  Go to the dentist least one time every 6 months.  Stay at a healthy weight. General instructions   Take over-the-counter and prescription medicines only as told by your doctor.  Share your diabetes care plan with: ? Your work or school. ? People you live with.  Check your pee (urine) for  ketones: ? When you are sick. ? As told by your doctor.  Carry a card or wear jewelry that says that you have diabetes.  Ask your doctor: ? Do I need to meet with a diabetes educator? ? Where can I find a support group for people with diabetes?  Keep all follow-up visits as told by your doctor. This is important. Where to find more information: To learn more about diabetes, visit:  American Diabetes Association: www.diabetes.org  American Association of Diabetes Educators: www.diabeteseducator.org/patient-resources  This information is not intended to replace advice given to you by your health care provider. Make sure you discuss any questions you have with your health care provider. Document Released: 09/30/2015 Document Revised: 11/14/2015 Document Reviewed: 07/12/2015 Elsevier Interactive Patient Education  [employer].

## 2017-12-24 ENCOUNTER — Ambulatory Visit: Payer: Medicare Other

## 2017-12-24 ENCOUNTER — Other Ambulatory Visit: Payer: Medicare Other

## 2018-01-11 ENCOUNTER — Other Ambulatory Visit: Payer: Self-pay | Admitting: Family Medicine

## 2018-01-27 ENCOUNTER — Other Ambulatory Visit: Payer: Self-pay

## 2018-01-27 ENCOUNTER — Ambulatory Visit (INDEPENDENT_AMBULATORY_CARE_PROVIDER_SITE_OTHER): Payer: Medicare Other | Admitting: Family Medicine

## 2018-01-27 ENCOUNTER — Encounter: Payer: Self-pay | Admitting: Family Medicine

## 2018-01-27 VITALS — BP 112/72 | HR 59 | Temp 97.8°F | Resp 15 | Ht 63.0 in | Wt 219.0 lb

## 2018-01-27 DIAGNOSIS — L304 Erythema intertrigo: Secondary | ICD-10-CM | POA: Diagnosis not present

## 2018-01-27 MED ORDER — CEPHALEXIN 500 MG PO CAPS
500.0000 mg | ORAL_CAPSULE | Freq: Two times a day (BID) | ORAL | 0 refills | Status: DC
Start: 2018-01-27 — End: 2018-06-02

## 2018-01-27 MED ORDER — NYSTATIN 100000 UNIT/GM EX CREA: 1.0000 "application " | TOPICAL_CREAM | Freq: Two times a day (BID) | CUTANEOUS | 0 refills | Status: DC

## 2018-01-27 MED ORDER — FLUCONAZOLE 150 MG PO TABS: 150.0000 mg | ORAL_TABLET | Freq: Every day | ORAL | 1 refills | Status: DC

## 2018-01-27 NOTE — Patient Instructions (Signed)
Skin Yeast Infection Skin yeast infection is a condition in which there is an overgrowth of yeast (candida) that normally lives on the skin. This condition usually occurs in areas of the skin that are constantly warm and moist, such as the armpits or the groin. What are the causes? This condition is caused by a change in the normal balance of the yeast and bacteria that live on the skin. What increases the risk? This condition is more likely to develop in:  People who are obese.  Pregnant women.  Women who take birth control pills.  People who have diabetes.  People who take antibiotic medicines.  People who take steroid medicines.  People who are malnourished.  People who have a weak defense (immune) system.  People who are 65 years of age or older.  What are the signs or symptoms? Symptoms of this condition include:  A red, swollen area of the skin.  Bumps on the skin.  Itchiness.  How is this diagnosed? This condition is diagnosed with a medical history and physical exam. Your health care provider may check for yeast by taking light scrapings of the skin to be viewed under a microscope. How is this treated? This condition is treated with medicine. Medicines may be prescribed or be available over-the-counter. The medicines may be:  Taken by mouth (orally).  Applied as a cream.  Follow these instructions at home:  Take or apply over-the-counter and prescription medicines only as told by your health care provider.  Eat more yogurt. This may help to keep your yeast infection from returning.  Maintain a healthy weight. If you need help losing weight, talk with your health care provider.  Keep your skin clean and dry.  If you have diabetes, keep your blood sugar under control. Contact a health care provider if:  Your symptoms go away and then return.  Your symptoms do not get better with treatment.  Your symptoms get worse.  Your rash spreads.  You have a  fever or chills.  You have new symptoms.  You have new warmth or redness of your skin. This information is not intended to replace advice given to you by your health care provider. Make sure you discuss any questions you have with your health care provider. Document Released: 02/24/2011 Document Revised: 02/02/2016 Document Reviewed: 12/10/2014 Elsevier Interactive Patient Education  2018 [employer] Inc.  

## 2018-01-27 NOTE — Progress Notes (Signed)
Subjective  CC:  Chief Complaint  Patient presents with  . Vaginitis    Lower abdomin and groin area, using Nystatin powder, bleeding, hurts and burns    HPI: Miranda Mathis is a 67 y.o. female who presents to the office today to address the problems listed above in the chief complaint.  Intertrigo : ongoing off and on for a year; on nystatin powder and triamcinolone cream for months w/o resolutions. Sugars are ok. Not spreading but can't get rid of it.    Assessment  1. Intertrigo      Plan   Yeast vs bacterial:  Keflex, diflucan, nystatin cream; stop steroid cream. To derm if can't get improved.   HM: pt still needs to schedule her mammogram and eye appt for diabetes check. Reminded today.  Follow up: Please follow up as scheduled for your next visit with me.  If you have any questions or concerns, please don't hesitate to send me a message via MyChart or call the office at [phone]. Thank you for visiting with [name] today! It's our pleasure caring for you.    No orders of the defined types were placed in this encounter.  Meds ordered this encounter  Medications  . fluconazole (DIFLUCAN) 150 MG tablet    Sig: Take 1 tablet (150 mg total) by mouth daily for 5 days.    Dispense:  5 tablet    Refill:  1  . cephALEXin (KEFLEX) 500 MG capsule    Sig: Take 1 capsule (500 mg total) by mouth 2 (two) times daily.    Dispense:  14 capsule    Refill:  0  . nystatin cream (MYCOSTATIN)    Sig: Apply 1 application topically 2 (two) times daily.    Dispense:  30 g    Refill:  0      I reviewed the patients updated PMH, FH, and SocHx.    Patient Active Problem List   Diagnosis Date Noted  . Seborrheic dermatitis of scalp 12/02/2017  . Psoriasis 12/02/2017  . Controlled type 2 diabetes mellitus without complication, without long-term current use of insulin ([place]) 09/02/2016  . Recurrent kidney stones 08/29/2015  . Barrett's esophagus with esophagitis 08/29/2015  . GAD  (generalized anxiety disorder) 08/29/2015  . GERD (gastroesophageal reflux disease) 08/29/2015  . Irritable bowel syndrome with constipation 08/29/2015  . Spondylosis of lumbar region without myelopathy or radiculopathy 08/29/2015  . Primary insomnia 10/25/2014  . Acquired hypothyroidism 11/02/2013  . Mixed hyperlipidemia 04/04/2012  . Allergic rhinitis 10/11/2007  . Fibromyalgia 10/11/2007  . Chronic low back pain 10/11/2007   Current Meds  Medication Sig  . acetaminophen (TYLENOL) 500 MG tablet Take 1,000 mg by mouth every 4 (four) hours as needed. pain   . betamethasone dipropionate 0.05 % lotion Apply 1 application topically daily as needed.  . clotrimazole-betamethasone (LOTRISONE) cream APPLY TO AFFECTED AREA TWICE A DAY  . escitalopram (LEXAPRO) 20 MG tablet Take 1 tablet (20 mg total) by mouth daily.  [name] esomeprazole (NEXIUM) 40 MG capsule Take 1 capsule (40 mg total) by mouth daily.  [name] ketoconazole (NIZORAL) 2 % shampoo Apply 1 application topically 2 (two) times a week.  . levothyroxine (SYNTHROID, LEVOTHROID) 137 MCG tablet Take 1 tablet (137 mcg total) by mouth daily before breakfast.  . meloxicam (MOBIC) 15 MG tablet Take 1 tablet (15 mg total) by mouth daily.  . pravastatin (PRAVACHOL) 40 MG tablet TAKE 1 TABLET DAILY  . senna-docusate (SENOKOT-S) 8.6-50 MG tablet Take 1-2  tablets by mouth every evening.  . triamcinolone lotion (KENALOG) 0.1 % Apply topically.  [name] zolpidem (AMBIEN) 10 MG tablet TAKE 1 TABLET BY MOUTH EVERY DAY AT BEDTIME AS NEEDED FOR SLEEP  . [DISCONTINUED] nystatin (MYCOSTATIN/NYSTOP) powder Apply topically 3 (three) times daily as needed.    Allergies: Patient is allergic to morphine. Family History: Patient family history includes Arthritis in her brother, daughter, father, maternal grandmother, and mother; Asthma in her daughter; Birth defects in her daughter and father; COPD in her mother; Cancer in her father; Coronary artery disease in her paternal  grandfather; Depression in her daughter; Diabetes in her paternal aunt; Emphysema in her mother; Hearing loss in her daughter; Hyperlipidemia in her brother; Hypertension in her father and mother; Kidney Stones in her daughter; Mental retardation in her daughter; Osteoarthritis in her mother; Prostate cancer in her father; Rheum arthritis in her mother. Social History:  Patient  reports that she has never smoked. She has never used smokeless tobacco. She reports that she drinks about 1.0 standard drinks of alcohol per week. She reports that she does not use drugs.  Review of Systems: Constitutional: Negative for fever malaise or anorexia Cardiovascular: negative for chest pain Respiratory: negative for SOB or persistent cough Gastrointestinal: negative for abdominal pain  Objective  Vitals: BP 112/72   Pulse (!) 59   Temp 97.8 F (36.6 C) (Oral)   Resp 15   Ht 5\' 3"  (1.6 m)   Wt 219 lb (99.3 kg)   SpO2 95%   BMI 38.79 kg/m  General: no acute distress , A&Ox3 Skin:  Warm, beneath pannus and groin with well demarcated erythematous macular rash with satellite lesions.    Commons side effects, risks, benefits, and alternatives for medications and treatment plan prescribed today were discussed, and the patient expressed understanding of the given instructions. Patient is instructed to call or message via MyChart if he/she has any questions or concerns regarding our treatment plan. No barriers to understanding were identified. We discussed Red Flag symptoms and signs in detail. Patient expressed understanding regarding what to do in case of urgent or emergency type symptoms.   Medication list was reconciled, printed and provided to the patient in AVS. Patient instructions and summary information was reviewed with the patient as documented in the AVS. This note was prepared with assistance of Dragon voice recognition software. Occasional wrong-word or sound-a-like substitutions may have  occurred due to the inherent limitations of voice recognition software

## 2018-03-28 DIAGNOSIS — E119 Type 2 diabetes mellitus without complications: Secondary | ICD-10-CM | POA: Diagnosis not present

## 2018-03-28 DIAGNOSIS — H2513 Age-related nuclear cataract, bilateral: Secondary | ICD-10-CM | POA: Diagnosis not present

## 2018-05-31 ENCOUNTER — Other Ambulatory Visit: Payer: Self-pay | Admitting: Family Medicine

## 2018-06-02 ENCOUNTER — Encounter: Payer: Self-pay | Admitting: Family Medicine

## 2018-06-02 ENCOUNTER — Ambulatory Visit (INDEPENDENT_AMBULATORY_CARE_PROVIDER_SITE_OTHER): Payer: Medicare Other | Admitting: Family Medicine

## 2018-06-02 ENCOUNTER — Other Ambulatory Visit: Payer: Self-pay

## 2018-06-02 VITALS — BP 132/88 | HR 70 | Temp 98.0°F | Resp 16 | Ht 63.0 in | Wt 220.0 lb

## 2018-06-02 DIAGNOSIS — G2581 Restless legs syndrome: Secondary | ICD-10-CM

## 2018-06-02 DIAGNOSIS — L409 Psoriasis, unspecified: Secondary | ICD-10-CM | POA: Diagnosis not present

## 2018-06-02 DIAGNOSIS — M797 Fibromyalgia: Secondary | ICD-10-CM

## 2018-06-02 DIAGNOSIS — E2839 Other primary ovarian failure: Secondary | ICD-10-CM | POA: Diagnosis not present

## 2018-06-02 DIAGNOSIS — Z1239 Encounter for other screening for malignant neoplasm of breast: Secondary | ICD-10-CM

## 2018-06-02 DIAGNOSIS — D509 Iron deficiency anemia, unspecified: Secondary | ICD-10-CM | POA: Diagnosis not present

## 2018-06-02 DIAGNOSIS — E1142 Type 2 diabetes mellitus with diabetic polyneuropathy: Secondary | ICD-10-CM | POA: Diagnosis not present

## 2018-06-02 DIAGNOSIS — Z23 Encounter for immunization: Secondary | ICD-10-CM

## 2018-06-02 DIAGNOSIS — E119 Type 2 diabetes mellitus without complications: Secondary | ICD-10-CM

## 2018-06-02 DIAGNOSIS — E782 Mixed hyperlipidemia: Secondary | ICD-10-CM | POA: Diagnosis not present

## 2018-06-02 DIAGNOSIS — M8580 Other specified disorders of bone density and structure, unspecified site: Secondary | ICD-10-CM

## 2018-06-02 LAB — MICROALBUMIN / CREATININE URINE RATIO
Creatinine,U: 128 mg/dL
Microalb Creat Ratio: 15.3 mg/g (ref 0.0–30.0)
Microalb, Ur: 19.6 mg/dL — ABNORMAL HIGH (ref 0.0–1.9)

## 2018-06-02 LAB — COMPREHENSIVE METABOLIC PANEL
ALT: 30 U/L (ref 0–35)
AST: 34 U/L (ref 0–37)
Albumin: 4.1 g/dL (ref 3.5–5.2)
Alkaline Phosphatase: 73 U/L (ref 39–117)
BUN: 18 mg/dL (ref 6–23)
CALCIUM: 9.2 mg/dL (ref 8.4–10.5)
CO2: 29 meq/L (ref 19–32)
Chloride: 103 mEq/L (ref 96–112)
Creatinine, Ser: 0.91 mg/dL (ref 0.40–1.20)
GFR: 65.34 mL/min (ref >60.00)
Glucose, Bld: 154 mg/dL — ABNORMAL HIGH (ref 70–99)
POTASSIUM: 4.3 meq/L (ref 3.5–5.1)
SODIUM: 139 meq/L (ref 135–145)
TOTAL PROTEIN: 7.2 g/dL (ref 6.0–8.3)
Total Bilirubin: 0.4 mg/dL (ref 0.2–1.2)

## 2018-06-02 LAB — CBC WITH DIFFERENTIAL/PLATELET
Basophils Absolute: 0 10*3/uL (ref 0.0–0.1)
Basophils Relative: 0.6 % (ref 0.0–3.0)
Eosinophils Absolute: 0.2 10*3/uL (ref 0.0–0.7)
Eosinophils Relative: 3.7 % (ref 0.0–5.0)
HCT: 42.5 % (ref 36.0–46.0)
Hemoglobin: 14.1 g/dL (ref 12.0–15.0)
Lymphocytes Relative: 22.4 % (ref 12.0–46.0)
Lymphs Abs: 1.4 10*3/uL (ref 0.7–4.0)
MCHC: 33.1 g/dL (ref 30.0–36.0)
MCV: 86.6 fl (ref 78.0–100.0)
Monocytes Absolute: 0.4 10*3/uL (ref 0.1–1.0)
Monocytes Relative: 5.8 % (ref 3.0–12.0)
Neutro Abs: 4.3 10*3/uL (ref 1.4–7.7)
Neutrophils Relative %: 67.5 % (ref 43.0–77.0)
Platelets: 209 10*3/uL (ref 150.0–400.0)
RBC: 4.91 Mil/uL (ref 3.87–5.11)
RDW: 14.7 % (ref 11.5–15.5)
WBC: 6.3 10*3/uL (ref 4.0–10.5)

## 2018-06-02 LAB — POCT GLYCOSYLATED HEMOGLOBIN (HGB A1C): Hemoglobin A1C: 6.8 % — AB (ref 4.0–5.6)

## 2018-06-02 LAB — LIPID PANEL
CHOLESTEROL: 152 mg/dL (ref 0–200)
HDL: 36.2 mg/dL — ABNORMAL LOW (ref >39.00)
LDL Cholesterol: 97 mg/dL (ref 0–99)
NonHDL: 115.51
Total CHOL/HDL Ratio: 4
Triglycerides: 95 mg/dL (ref 0.0–149.0)
VLDL: 19 mg/dL (ref 0.0–40.0)

## 2018-06-02 MED ORDER — GABAPENTIN 300 MG PO CAPS: 300.0000 mg | ORAL_CAPSULE | Freq: Every day | ORAL | 3 refills | Status: DC

## 2018-06-02 NOTE — Patient Instructions (Addendum)
Please return in March 2020 for your annu Peripheral Neuropathy Peripheral neuropathy is a type of nerve damage. It affects nerves that carry signals between the spinal cord and other parts of the body. These are called peripheral nerves. With peripheral neuropathy, one nerve or a group of nerves may be damaged. What are the causes? Many things can damage peripheral nerves. For some people with peripheral neuropathy, the cause is unknown. Some causes include:  Diabetes. This is the most common cause of peripheral neuropathy.  Injury to a nerve.  Pressure or stress on a nerve that lasts a long time.  Too little vitamin B. Alcoholism can lead to this.  Infections.  Autoimmune diseases, such as multiple sclerosis and systemic lupus erythematosus.  Inherited nerve diseases.  Some medicines, such as cancer drugs.  Toxic substances, such as lead and mercury.  Too little blood flowing to the legs.  Kidney disease.  Thyroid disease.  What are the signs or symptoms? Different people have different symptoms. The symptoms you have will depend on which of your nerves is damaged. Common symptoms include:  Loss of feeling (numbness) in the feet and hands.  Tingling in the feet and hands.  Pain that burns.  Very sensitive skin.  Weakness.  Not being able to move a part of the body (paralysis).  Muscle twitching.  Clumsiness or poor coordination.  Loss of balance.  Not being able to control your bladder.  Feeling dizzy.  Sexual problems.  How is this diagnosed? Peripheral neuropathy is a symptom, not a disease. Finding the cause of peripheral neuropathy can be hard. To figure that out, your health care provider will take a medical history and do a physical exam. A neurological exam will also be done. This involves checking things affected by your brain, spinal cord, and nerves (nervous system). For example, your health care provider will check your reflexes, how you move,  and what you can feel. Other types of tests may also be ordered, such as:  Blood tests.  A test of the fluid in your spinal cord.  Imaging tests, such as CT scans or an MRI.  Electromyography (EMG). This test checks the nerves that control muscles.  Nerve conduction velocity tests. These tests check how fast messages pass through your nerves.  Nerve biopsy. A small piece of nerve is removed. It is then checked under a microscope.  How is this treated?  Medicine is often used to treat peripheral neuropathy. Medicines may include: ? Pain-relieving medicines. Prescription or over-the-counter medicine may be suggested. ? Antiseizure medicine. This may be used for pain. ? Antidepressants. These also may help ease pain from neuropathy. ? Lidocaine. This is a numbing medicine. You might wear a patch or be given a shot. ? Mexiletine. This medicine is typically used to help control irregular heart rhythms.  Surgery. Surgery may be needed to relieve pressure on a nerve or to destroy a nerve that is causing pain.  Physical therapy to help movement.  Assistive devices to help movement. Follow these instructions at home:  Only take over-the-counter or prescription medicines as directed by your health care provider. Follow the instructions carefully for any given medicines. Do not take any other medicines without first getting approval from your health care provider.  If you have diabetes, work closely with your health care provider to keep your blood sugar under control.  If you have numbness in your feet: ? Check every day for signs of injury or infection. Watch for redness, warmth,  and swelling. ? Wear padded socks and comfortable shoes. These help protect your feet.  Do not do things that put pressure on your damaged nerve.  Do not smoke. Smoking keeps blood from getting to damaged nerves.  Avoid or limit alcohol. Too much alcohol can cause a lack of B vitamins. These vitamins are  needed for healthy nerves.  Develop a good support system. Coping with peripheral neuropathy can be stressful. Talk to a mental health specialist or join a support group if you are struggling.  Follow up with your health care provider as directed. Contact a health care provider if:  You have new signs or symptoms of peripheral neuropathy.  You are struggling emotionally from dealing with peripheral neuropathy.  You have a fever. Get help right away if:  You have an injury or infection that is not healing.  You feel very dizzy or begin vomiting.  You have chest pain.  You have trouble breathing. This information is not intended to replace advice given to you by your health care provider. Make sure you discuss any questions you have with your health care provider. Document Released: 05/29/2002 Document Revised: 11/14/2015 Document Reviewed: 02/13/2013 Elsevier Interactive Patient Education  2017 [place] complete physical; please come fasting with Diabetes follow up.   I will release your lab results to you on your MyChart account with further instructions. Please reply with any questions.  I am checking your cholesterol and blood counts today.   Start the gabapentin at night to help with your neuropathy symptoms and hopefully fibromyalgia as well.   We will call you with information regarding your referral appointment. [facility] for mammogram and bone density. And to a dermatologist as well.  If you do not hear from [name] within the next 2 weeks, please let me know. It can take 1-2 weeks to get appointments set up with the specialists.   You may scheule your medicare annual wellness visit in April 2020.  Medicare recommends an Annual Wellness Visit for all patients. Please schedule this to be done with our Nurse Educator, Maudie Mercury. This is an informative "talk" visit; it's goals are to ensure that your health care needs are being met and to give you education regarding avoiding falls,  ensuring you are not suffering from depression or problems with memory or thinking, and to educate you on Advance Care Planning. It helps me take good care of you!     If you have any questions or concerns, please don't hesitate to send me a message via MyChart or call the office at [phone]. Thank you for visiting with [name] today! It's our pleasure caring for you.

## 2018-06-02 NOTE — Progress Notes (Signed)
Subjective  CC:  Chief Complaint  Patient presents with  . Diabetes    A1C done today, pt also getting flu shot today  . Hypertension    HPI: Miranda Mathis is a 67 y.o. female who presents to the office today for follow up of diabetes and problems listed above in the chief complaint.   Diabetes and HLD follow LY:YTKP controlled. Not on ace. On statin with elevated ldl. Not fasting today.  Her diabetic control is reported as Unchanged.  Follows a pretty healthy diet.  Denies symptoms of hyperglycemia She denies exertional CP or SOB or symptomatic hypoglycemia. She denies foot sores but c/o paresthesias and restless leg sxs.  Complains of burning in her lower extremities and feet.  Worse at night.  Also has sensations that require moving her legs.  No lower extremity edema.  No open sores.  No claudication.  Tolerating statin without myalgias  Mixed hyperlipidemia with elevated LDL on statin as noted above.  Near fasting today.  Takes Pravachol 40 mg nightly and has been compliant.  Worsening psoriasis: Has never been to dermatology.  Has used steroid creams and solutions without significant benefit.  Would like referral  Health maintenance: Due for breast cancer screening with mammogram and bone density follow-up for osteopenia.  Assessment  1. Controlled type 2 diabetes mellitus without complication, without long-term current use of insulin ([place])   2. Fibromyalgia   3. Mixed hyperlipidemia   4. DM type 2 with diabetic peripheral neuropathy ([place])   5. Psoriasis   6. Osteopenia, unspecified location   7. Hypoestrogenism   8. Breast cancer screening   9. Restless legs      Plan   Diabetes is currently adequately controlled.  Have low threshold to start diabetic medication.  Patient defers for now.  We will recheck in 3 months.  Continue diabetic diet.  Check urine studies and add ACE if indicated.  Blood pressures well controlled without medications.  Immunizations: Flu shot given  today  Diabetic peripheral neuropathy symptoms: Start gabapentin nightly.  Education given.  Restless leg symptoms: Check for iron deficiency.  Monitor on gabapentin.  Add Requip if needed  Psoriasis: Refer to dermatology  Health maintenance bone density and mammogram ordered.  Continue calcium and vitamin D for osteopenia  Fibromyalgia: Mostly stable.  Adding gabapentin  Follow up: Return in about 3 months (around 09/01/2018) for complete physical.. Orders Placed This Encounter  Procedures  . DG Bone Density  . Lipid panel  . Comprehensive metabolic panel  . Microalbumin / creatinine urine ratio  . CBC with Differential/Platelet  . Iron, TIBC and Ferritin Panel  . Ambulatory referral to Dermatology  . POCT glycosylated hemoglobin (Hb A1C)   Meds ordered this encounter  Medications  . gabapentin (NEURONTIN) 300 MG capsule    Sig: Take 1 capsule (300 mg total) by mouth at bedtime.    Dispense:  90 capsule    Refill:  3      Immunization History  Administered Date(s) Administered  . Influenza Whole 03/22/2001, 03/22/2006, 03/22/2010  . Influenza, High Dose Seasonal PF 02/19/2016  . Influenza, Seasonal, Injecte, Preservative Fre 06/27/2012  . Pneumococcal Conjugate-13 11/27/2015  . Pneumococcal Polysaccharide-23 11/13/2010  . Tdap 11/13/2010  . Zoster 11/27/2015    Diabetes Related Lab Review: Lab Results  Component Value Date   HGBA1C 6.8 (A) 06/02/2018   HGBA1C 6.7 (A) 12/02/2017   HGBA1C 6.8 08/27/2017    Lab Results  Component Value Date  MICROALBUR Urine Albumin 90.1 Alb/Creatine Ratio: 54.1 08/27/2017   MICROALBUR 90.1 08/27/2017   Lab Results  Component Value Date   CREATININE 0.8 08/27/2017   BUN 17 08/27/2017   NA 141 08/27/2017   K 4.3 08/27/2017   CL 107 03/24/2017   CO2 27 03/24/2017   Lab Results  Component Value Date   CHOL 166 08/27/2017   CHOL 226 (H) 01/09/2015   CHOL 203 (H) 11/02/2013   Lab Results  Component Value Date   HDL 35  08/27/2017   HDL 39.80 01/09/2015   HDL 37.40 (L) 11/02/2013   Lab Results  Component Value Date   LDLCALC 112 08/27/2017   LDLCALC 112 08/27/2017   LDLCALC 167 (H) 01/09/2015   Lab Results  Component Value Date   TRIG 96 08/27/2017   TRIG 98.0 01/09/2015   TRIG 105.0 11/02/2013   Lab Results  Component Value Date   CHOLHDL 6 01/09/2015   CHOLHDL 5 11/02/2013   CHOLHDL 5 11/13/2010   Lab Results  Component Value Date   LDLDIRECT 165.3 11/13/2010   LDLDIRECT 143.5 08/30/2008   The 10-year ASCVD risk score Mikey Bussing DC Jr., et al., 2013) is: 15.9%*   Values used to calculate the score:     Age: 62 years     Sex: Female     Is Non-Hispanic African American: No     Diabetic: Yes     Tobacco smoker: No     Systolic Blood Pressure: 269 mmHg     Is BP treated: No     HDL Cholesterol: 35 mg/dL*     Total Cholesterol: 166 mg/dL     * - Cholesterol units were assumed for this score calculation I have reviewed the PMH, Fam and Soc history. Patient Active Problem List   Diagnosis Date Noted  . Seborrheic dermatitis of scalp 12/02/2017  . Psoriasis 12/02/2017  . DM type 2 with diabetic peripheral neuropathy ([place]) 09/02/2016  . Recurrent kidney stones 08/29/2015    Overview:  S/p stents, multiple, [facility], Dr. Linton Ham   . Barrett's esophagus with esophagitis 08/29/2015    Last EGD 2018, no evidence of Barrett's esophagus, no further EGD recommended   . GAD (generalized anxiety disorder) 08/29/2015  . GERD (gastroesophageal reflux disease) 08/29/2015  . Irritable bowel syndrome with constipation 08/29/2015  . Spondylosis of lumbar region without myelopathy or radiculopathy 08/29/2015  . Primary insomnia 10/25/2014    Overview:  Last Assessment & Plan:  Insomnia is stable with current regimen of Ambien. Continue current dosage of Ambien.   . Acquired hypothyroidism 11/02/2013  . Mixed hyperlipidemia 04/04/2012  . Allergic rhinitis 10/11/2007    Qualifier: Diagnosis of   By: Tiney Rouge CMA, Ellison Hughs     . Fibromyalgia 10/11/2007    Qualifier: Diagnosis of  By: Tiney Rouge CMA, Ellison Hughs     . Chronic low back pain 10/11/2007    Social History: Patient  reports that she has never smoked. She has never used smokeless tobacco. She reports current alcohol use of about 1.0 standard drinks of alcohol per week. She reports that she does not use drugs.  Review of Systems: Ophthalmic: negative for eye pain, loss of vision or double vision Cardiovascular: negative for chest pain Respiratory: negative for SOB or persistent cough Gastrointestinal: negative for abdominal pain Genitourinary: negative for dysuria or gross hematuria MSK: negative for foot lesions Neurologic: negative for weakness or gait disturbance  Objective  Vitals: BP 132/88   Pulse 70   Temp 98 F (  36.7 C) (Oral)   Resp 16   Ht 5\' 3"  (1.6 m)   Wt 220 lb (99.8 kg)   SpO2 96%   BMI 38.97 kg/m  General: well appearing, no acute distress  Psych:  Alert and oriented, normal mood and affect HEENT:  Normocephalic, atraumatic, moist mucous membranes, supple neck  Cardiovascular:  Nl S1 and S2, RRR without murmur, gallop or rub. no edema Respiratory:  Good breath sounds bilaterally, CTAB with normal effort, no rales Gastrointestinal: normal BS, soft, nontender Skin:  Warm, no rashes Neurologic:   Mental status is normal. normal gait Musculoskeletal: Normal-appearing feet no edema Foot exam: no erythema, pallor, or cyanosis visible nl proprioception and sensation to monofilament testing bilaterally, +2 distal pulses bilaterally    Diabetic education: ongoing education regarding chronic disease management for diabetes was given today. We continue to reinforce the ABC's of diabetic management: A1c (<7 or 8 dependent upon patient), tight blood pressure control, and cholesterol management with goal LDL < 100 minimally. We discuss diet strategies, exercise recommendations, medication options and possible  side effects. At each visit, we review recommended immunizations and preventive care recommendations for diabetics and stress that good diabetic control can prevent other problems. See below for this patient's data.    Commons side effects, risks, benefits, and alternatives for medications and treatment plan prescribed today were discussed, and the patient expressed understanding of the given instructions. Patient is instructed to call or message via MyChart if he/she has any questions or concerns regarding our treatment plan. No barriers to understanding were identified. We discussed Red Flag symptoms and signs in detail. Patient expressed understanding regarding what to do in case of urgent or emergency type symptoms.   Medication list was reconciled, printed and provided to the patient in AVS. Patient instructions and summary information was reviewed with the patient as documented in the AVS. This note was prepared with assistance of Dragon voice recognition software. Occasional wrong-word or sound-a-like substitutions may have occurred due to the inherent limitations of voice recognition software

## 2018-06-03 LAB — IRON,TIBC AND FERRITIN PANEL
%SAT: 21 % (calc) (ref 16–45)
Ferritin: 53 ng/mL (ref 16–288)
IRON: 82 ug/dL (ref 45–160)
TIBC: 394 mcg/dL (calc) (ref 250–450)

## 2018-06-03 MED ORDER — LISINOPRIL 5 MG PO TABS: 5.0000 mg | ORAL_TABLET | Freq: Every day | ORAL | 3 refills | Status: DC

## 2018-06-03 NOTE — Addendum Note (Signed)
Addended by: Billey Chang on: 06/03/2018 04:24 PM   Modules accepted: Orders

## 2018-06-03 NOTE — Progress Notes (Signed)
Please call patient: I have reviewed his/her lab results. Labs look pretty good. Iron levels are fine. Cholesterol levels are fine. Urine shows some microscopic protein loss so I want to start a kidney protective medication called lisinopril to be taken once daily. Diabetes control is fair. Need to improve diet a bit to avoid needing to start diabetes medication. No other changes recommended at this time. We will recheck in 3 months as discussed. Thanks!

## 2018-06-04 DIAGNOSIS — Z90710 Acquired absence of both cervix and uterus: Secondary | ICD-10-CM | POA: Diagnosis not present

## 2018-06-04 DIAGNOSIS — Z780 Asymptomatic menopausal state: Secondary | ICD-10-CM | POA: Diagnosis not present

## 2018-06-04 DIAGNOSIS — Z8262 Family history of osteoporosis: Secondary | ICD-10-CM | POA: Diagnosis not present

## 2018-06-04 DIAGNOSIS — M8589 Other specified disorders of bone density and structure, multiple sites: Secondary | ICD-10-CM | POA: Diagnosis not present

## 2018-06-04 LAB — HM DEXA SCAN

## 2018-06-09 ENCOUNTER — Encounter: Payer: Self-pay | Admitting: *Deleted

## 2018-06-13 ENCOUNTER — Other Ambulatory Visit: Payer: Self-pay

## 2018-06-13 ENCOUNTER — Encounter: Payer: Self-pay | Admitting: Family Medicine

## 2018-06-13 ENCOUNTER — Ambulatory Visit: Payer: Self-pay

## 2018-06-13 ENCOUNTER — Ambulatory Visit (INDEPENDENT_AMBULATORY_CARE_PROVIDER_SITE_OTHER): Payer: Medicare Other | Admitting: Family Medicine

## 2018-06-13 VITALS — BP 128/78 | HR 77 | Temp 99.8°F | Resp 16 | Ht 63.0 in | Wt 218.6 lb

## 2018-06-13 DIAGNOSIS — R350 Frequency of micturition: Secondary | ICD-10-CM

## 2018-06-13 DIAGNOSIS — N200 Calculus of kidney: Secondary | ICD-10-CM | POA: Diagnosis not present

## 2018-06-13 LAB — URINALYSIS, MICROSCOPIC ONLY

## 2018-06-13 MED ORDER — TAMSULOSIN HCL 0.4 MG PO CAPS: 0.4000 mg | ORAL_CAPSULE | Freq: Every day | ORAL | 0 refills | Status: DC

## 2018-06-13 MED ORDER — CIPROFLOXACIN HCL 500 MG PO TABS: 500.0000 mg | ORAL_TABLET | Freq: Two times a day (BID) | ORAL | 0 refills | Status: DC

## 2018-06-13 MED ORDER — KETOROLAC TROMETHAMINE 10 MG PO TABS: 10.0000 mg | ORAL_TABLET | Freq: Four times a day (QID) | ORAL | 0 refills | Status: DC | PRN

## 2018-06-13 NOTE — Patient Instructions (Signed)
Please return in 2-3 days if not improving.  Push fluids   If you have any questions or concerns, please don't hesitate to send me a message via MyChart or call the office at [phone]. Thank you for visiting with [name] today! It's our pleasure caring for you.

## 2018-06-13 NOTE — Progress Notes (Signed)
Subjective   CC:  Chief Complaint  Patient presents with  . Urinary Tract Infection    Symptoms started about 2 weeks ago agot worse last night. Abdominal pain, lower back pain, urinary frequency.. Denies painful urination. Has taking [facility] urinary pain relief with some relief    HPI: Miranda Mathis is a 67 y.o. female who presents to the office today to address the problems listed above in the chief complaint. Patient reports left flank pain and urinary frequency.  She has a long standing history of recurrent kidney stones, typically on right but has had on left before. Last bout was 03/2017. Has had renal stents in past due to hydronephrosis and stones. Reports has had a few weeks of "bladder flutters" without dysuria; but last night had acute onset of left flank pain, colicky in nature and moderate associated with urinary frequency w/o dysuria or gross hematuria. No f/c/s, n/v.   Her urologist will treat with empiric abx, toradol po for pain control, and push fluids. She did use two of her daughters norco to help alleviate the pain with fairly good results.   Pt has been using AZO to help with bladder spasm. Thus, no UA dip was done .  Assessment  1. Recurrent kidney stones   2. Urinary frequency      Plan   Presumed recurrent left renal stone +/- cystitis: flomax, toradol, cipro and check urine culture and micro. Push fluids. To er for fever worsening pain or n/v. F/u here if not improving in 3-4 days.   Follow up: No follow-ups on file.  Orders Placed This Encounter  Procedures  . Urine Culture  . Urine Microscopic Only   Meds ordered this encounter  Medications  . ciprofloxacin (CIPRO) 500 MG tablet    Sig: Take 1 tablet (500 mg total) by mouth 2 (two) times daily for 7 days.    Dispense:  14 tablet    Refill:  0  . ketorolac (TORADOL) 10 MG tablet    Sig: Take 1 tablet (10 mg total) by mouth every 6 (six) hours as needed.    Dispense:  30 tablet    Refill:  0  .  tamsulosin (FLOMAX) 0.4 MG CAPS capsule    Sig: Take 1 capsule (0.4 mg total) by mouth daily.    Dispense:  10 capsule    Refill:  0      I reviewed the patients updated PMH, FH, and SocHx.    Patient Active Problem List   Diagnosis Date Noted  . DM type 2 with diabetic peripheral neuropathy ([place]) 09/02/2016    Priority: High  . Recurrent kidney stones 08/29/2015    Priority: High  . Barrett's esophagus with esophagitis 08/29/2015    Priority: High  . GAD (generalized anxiety disorder) 08/29/2015    Priority: High  . Acquired hypothyroidism 11/02/2013    Priority: High  . Mixed hyperlipidemia 04/04/2012    Priority: High  . Fibromyalgia 10/11/2007    Priority: High  . Chronic low back pain 10/11/2007    Priority: High  . Psoriasis 12/02/2017    Priority: Medium  . GERD (gastroesophageal reflux disease) 08/29/2015    Priority: Medium  . Irritable bowel syndrome with constipation 08/29/2015    Priority: Medium  . Spondylosis of lumbar region without myelopathy or radiculopathy 08/29/2015    Priority: Medium  . Primary insomnia 10/25/2014    Priority: Medium  . Seborrheic dermatitis of scalp 12/02/2017    Priority: Low  .  Allergic rhinitis 10/11/2007    Priority: Low   Current Meds  Medication Sig  . acetaminophen (TYLENOL) 500 MG tablet Take 1,000 mg by mouth every 4 (four) hours as needed. pain   . betamethasone dipropionate 0.05 % lotion Apply 1 application topically daily as needed.  . cholecalciferol (VITAMIN D) 1000 units tablet Take 2,000 Units by mouth daily.  . clotrimazole-betamethasone (LOTRISONE) cream APPLY TO AFFECTED AREA TWICE A DAY  . escitalopram (LEXAPRO) 20 MG tablet Take 1 tablet (20 mg total) by mouth daily.  [name] esomeprazole (NEXIUM) 40 MG capsule Take 1 capsule (40 mg total) by mouth daily.  [name] gabapentin (NEURONTIN) 300 MG capsule Take 1 capsule (300 mg total) by mouth at bedtime.  [name] ketoconazole (NIZORAL) 2 % shampoo Apply 1 application topically  2 (two) times a week.  . levothyroxine (SYNTHROID, LEVOTHROID) 137 MCG tablet Take 1 tablet (137 mcg total) by mouth daily before breakfast.  . lisinopril (PRINIVIL,ZESTRIL) 5 MG tablet Take 1 tablet (5 mg total) by mouth daily.  . meloxicam (MOBIC) 15 MG tablet Take 1 tablet (15 mg total) by mouth daily.  [name] nystatin cream (MYCOSTATIN) Apply 1 application topically 2 (two) times daily.  . pravastatin (PRAVACHOL) 40 MG tablet TAKE 1 TABLET DAILY  . senna-docusate (SENOKOT-S) 8.6-50 MG tablet Take 1-2 tablets by mouth every evening.  . triamcinolone lotion (KENALOG) 0.1 % Apply topically.  [name] zolpidem (AMBIEN) 10 MG tablet TAKE 1 TABLET BY MOUTH EVERY DAY AT BEDTIME AS NEEDED FOR SLEEP    Review of Systems: Cardiovascular: negative for chest pain Respiratory: negative for SOB or persistent cough Gastrointestinal: negative for abdominal pain Constitutional: Negative for fever malaise or anorexia  Objective  Vitals: BP 128/78   Pulse 77   Temp 99.8 F (37.7 C) (Oral)   Resp 16   Ht 5\' 3"  (1.6 m)   Wt 218 lb 9.6 oz (99.2 kg)   SpO2 93%   BMI 38.72 kg/m  General: no acute distress , nontoxic Psych:  Alert and oriented, normal mood and affect Cardiovascular:  RRR without murmur or gallop. no peripheral edema Respiratory:  Good breath sounds bilaterally, CTAB with normal respiratory effort Gastrointestinal: soft, flat abdomen, normal active bowel sounds, no palpable masses, no hepatosplenomegaly, no appreciated hernias, + left CVAT, mild suprapubic ttp w/o rebound or guarding Skin:  Warm, no rashes Neurologic:   Mental status is normal. normal gait   Commons side effects, risks, benefits, and alternatives for medications and treatment plan prescribed today were discussed, and the patient expressed understanding of the given instructions. Patient is instructed to call or message via MyChart if he/she has any questions or concerns regarding our treatment plan. No barriers to understanding  were identified. We discussed Red Flag symptoms and signs in detail. Patient expressed understanding regarding what to do in case of urgent or emergency type symptoms.   Medication list was reconciled, printed and provided to the patient in AVS. Patient instructions and summary information was reviewed with the patient as documented in the AVS. This note was prepared with assistance of Dragon voice recognition software. Occasional wrong-word or sound-a-like substitutions may have occurred due to the inherent limitations of voice recognition software

## 2018-06-13 NOTE — Telephone Encounter (Signed)
Pt c/o severe bilateral flank pain and left sided abdominal pain. Pt stated pain started last night. Pt stated pain is constant and has tried taking Tylenol and has not helped. Pt stated that she has a h/o kidney stones and and stents placed. Pt stated that she will not go to the ED as advised. Pt stated that she may go to an [facility]. Called [facility] and spoke with Elyn Aquas PA and updated triage. Einar Pheasant stated to send noted and will forward to Dr Jonni Sanger. Pt stated in the past pain medicine has been called in for her (Toradol).   Reason for Disposition . [1] SEVERE pain (e.g., excruciating, scale 8-10) AND [2] not improved after pain medicine  Answer Assessment - Initial Assessment Questions 1. LOCATION: "Where does it hurt?" (e.g., left, right)     Both flanks and left abdominal pain 2. ONSET: "When did the pain start?"     Last night 3. SEVERITY: "How bad is the pain?" (e.g., Scale 1-10; mild, moderate, or severe)   - MILD (1-3): doesn't interfere with normal activities    - MODERATE (4-7): interferes with normal activities or awakens from sleep    - SEVERE (8-10): excruciating pain and patient unable to do normal activities (stays in bed)       Severe 8/10 4. PATTERN: "Does the pain come and go, or is it constant?"      constant 5. CAUSE: "What do you think is causing the pain?"     Kidney stones 6. OTHER SYMPTOMS:  "Do you have any other symptoms?" (e.g., fever, abdominal pain, vomiting, leg weakness, burning with urination, blood in urine)     Nausea abdominal pain, dry heaves 7. PREGNANCY:  "Is there any chance you are pregnant?" "When was your last menstrual period?"     n/a  Protocols used: FLANK PAIN-A-AH

## 2018-06-13 NOTE — Telephone Encounter (Signed)
Please advise 

## 2018-06-14 LAB — URINE CULTURE
MICRO NUMBER:: 91533401
Result:: NO GROWTH
SPECIMEN QUALITY:: ADEQUATE

## 2018-06-16 NOTE — Progress Notes (Signed)
Please call patient: I have reviewed his/her lab results. Urine culture is negative, so no infection. May stop antibiotics after today. Has pain improved? If still having problems, schedule for f/u with me tomorrow. Thanks!

## 2018-06-17 ENCOUNTER — Ambulatory Visit (INDEPENDENT_AMBULATORY_CARE_PROVIDER_SITE_OTHER): Payer: Medicare Other | Admitting: Family Medicine

## 2018-06-17 ENCOUNTER — Other Ambulatory Visit: Payer: Self-pay

## 2018-06-17 ENCOUNTER — Encounter: Payer: Self-pay | Admitting: Family Medicine

## 2018-06-17 VITALS — BP 112/76 | HR 76 | Temp 98.3°F | Resp 16 | Ht 63.0 in | Wt 222.0 lb

## 2018-06-17 DIAGNOSIS — N200 Calculus of kidney: Secondary | ICD-10-CM

## 2018-06-17 NOTE — Patient Instructions (Signed)
Please return as needed. See you in march for your physical.  If you have any questions or concerns, please don't hesitate to send me a message via MyChart or call the office at [phone]. Thank you for visiting with [name] today! It's our pleasure caring for you.

## 2018-06-17 NOTE — Progress Notes (Signed)
Subjective  CC:  Chief Complaint  Patient presents with  . Follow-up    Feeling much better.. Symptoms are very minimal    HPI: Miranda Mathis is a 67 y.o. female who presents to the office today to address the problems listed above in the chief complaint.  Here for f/u of left kidney stone: treated with toradol, flomax and abx. needed pain meds until this am. Today, feeling much better. Still with flank "soreness" but no longer with pain. Urination is normal again. Urine culture was negative. She completed 3 days of abx as instructed. Still on flomax. no n/v/fever.   Assessment  1. Recurrent kidney stones      Plan   nephrolithiasis:  Improving. Continue flomax and fluids.   Follow up: Return if symptoms worsen or fail to improve.  09/07/2018  No orders of the defined types were placed in this encounter.  No orders of the defined types were placed in this encounter.     I reviewed the patients updated PMH, FH, and SocHx.    Patient Active Problem List   Diagnosis Date Noted  . DM type 2 with diabetic peripheral neuropathy ([place]) 09/02/2016    Priority: High  . Recurrent kidney stones 08/29/2015    Priority: High  . Barrett's esophagus with esophagitis 08/29/2015    Priority: High  . GAD (generalized anxiety disorder) 08/29/2015    Priority: High  . Acquired hypothyroidism 11/02/2013    Priority: High  . Mixed hyperlipidemia 04/04/2012    Priority: High  . Fibromyalgia 10/11/2007    Priority: High  . Chronic low back pain 10/11/2007    Priority: High  . Psoriasis 12/02/2017    Priority: Medium  . GERD (gastroesophageal reflux disease) 08/29/2015    Priority: Medium  . Irritable bowel syndrome with constipation 08/29/2015    Priority: Medium  . Spondylosis of lumbar region without myelopathy or radiculopathy 08/29/2015    Priority: Medium  . Primary insomnia 10/25/2014    Priority: Medium  . Seborrheic dermatitis of scalp 12/02/2017    Priority: Low  .  Allergic rhinitis 10/11/2007    Priority: Low   Current Meds  Medication Sig  . acetaminophen (TYLENOL) 500 MG tablet Take 1,000 mg by mouth every 4 (four) hours as needed. pain   . betamethasone dipropionate 0.05 % lotion Apply 1 application topically daily as needed.  . cholecalciferol (VITAMIN D) 1000 units tablet Take 2,000 Units by mouth daily.  . clotrimazole-betamethasone (LOTRISONE) cream APPLY TO AFFECTED AREA TWICE A DAY  . escitalopram (LEXAPRO) 20 MG tablet Take 1 tablet (20 mg total) by mouth daily.  [name] esomeprazole (NEXIUM) 40 MG capsule Take 1 capsule (40 mg total) by mouth daily.  [name] gabapentin (NEURONTIN) 300 MG capsule Take 1 capsule (300 mg total) by mouth at bedtime.  [name] ketoconazole (NIZORAL) 2 % shampoo Apply 1 application topically 2 (two) times a week.  . levothyroxine (SYNTHROID, LEVOTHROID) 137 MCG tablet Take 1 tablet (137 mcg total) by mouth daily before breakfast.  . lisinopril (PRINIVIL,ZESTRIL) 5 MG tablet Take 1 tablet (5 mg total) by mouth daily.  . meloxicam (MOBIC) 15 MG tablet Take 1 tablet (15 mg total) by mouth daily.  [name] nystatin cream (MYCOSTATIN) Apply 1 application topically 2 (two) times daily.  . pravastatin (PRAVACHOL) 40 MG tablet TAKE 1 TABLET DAILY  . senna-docusate (SENOKOT-S) 8.6-50 MG tablet Take 1-2 tablets by mouth every evening.  . tamsulosin (FLOMAX) 0.4 MG CAPS capsule Take 1 capsule (0.4  mg total) by mouth daily.  [name] triamcinolone lotion (KENALOG) 0.1 % Apply topically.  [name] zolpidem (AMBIEN) 10 MG tablet TAKE 1 TABLET BY MOUTH EVERY DAY AT BEDTIME AS NEEDED FOR SLEEP  . [DISCONTINUED] ciprofloxacin (CIPRO) 500 MG tablet Take 1 tablet (500 mg total) by mouth 2 (two) times daily for 7 days.    Allergies: Patient is allergic to morphine. Family History: Patient family history includes Arthritis in her brother, daughter, father, maternal grandmother, and mother; Asthma in her daughter; Birth defects in her daughter and father; COPD in her  mother; Cancer in her father; Coronary artery disease in her paternal grandfather; Depression in her daughter; Diabetes in her paternal aunt; Emphysema in her mother; Hearing loss in her daughter; Hyperlipidemia in her brother; Hypertension in her father and mother; Kidney Stones in her daughter; Mental retardation in her daughter; Osteoarthritis in her mother; Prostate cancer in her father; Rheum arthritis in her mother. Social History:  Patient  reports that she has never smoked. She has never used smokeless tobacco. She reports current alcohol use of about 1.0 standard drinks of alcohol per week. She reports that she does not use drugs.  Review of Systems: Constitutional: Negative for fever malaise or anorexia Cardiovascular: negative for chest pain Respiratory: negative for SOB or persistent cough Gastrointestinal: negative for abdominal pain  Objective  Vitals: BP 112/76   Pulse 76   Temp 98.3 F (36.8 C) (Oral)   Resp 16   Ht 5\' 3"  (1.6 m)   Wt 222 lb (100.7 kg)   SpO2 94%   BMI 39.33 kg/m  General: no acute distress , A&Ox3, happy and smiling today. Looks better HEENT: PEERL, conjunctiva normal, Cardiovascular:  RRR without murmur or gallop.  Respiratory:  Good breath sounds bilaterally, CTAB with normal respiratory effort Gastrointestinal: soft, flat abdomen, normal active bowel sounds, no palpable masses, no hepatosplenomegaly, no appreciated hernias, no cvat bilaterally, no suprapubic ttp Skin:  Warm, no rashes     Commons side effects, risks, benefits, and alternatives for medications and treatment plan prescribed today were discussed, and the patient expressed understanding of the given instructions. Patient is instructed to call or message via MyChart if he/she has any questions or concerns regarding our treatment plan. No barriers to understanding were identified. We discussed Red Flag symptoms and signs in detail. Patient expressed understanding regarding what to do in  case of urgent or emergency type symptoms.   Medication list was reconciled, printed and provided to the patient in AVS. Patient instructions and summary information was reviewed with the patient as documented in the AVS. This note was prepared with assistance of Dragon voice recognition software. Occasional wrong-word or sound-a-like substitutions may have occurred due to the inherent limitations of voice recognition software

## 2018-06-28 ENCOUNTER — Other Ambulatory Visit: Payer: Self-pay | Admitting: Family Medicine

## 2018-07-12 ENCOUNTER — Other Ambulatory Visit: Payer: Self-pay | Admitting: Family Medicine

## 2018-07-18 ENCOUNTER — Other Ambulatory Visit: Payer: Self-pay | Admitting: Family Medicine

## 2018-07-18 MED ORDER — ZOLPIDEM TARTRATE 10 MG PO TABS: ORAL_TABLET | ORAL | 5 refills | Status: DC

## 2018-07-18 NOTE — Telephone Encounter (Signed)
Pharmacy did not receive the RX on 07/15/18 please resend

## 2018-07-18 NOTE — Addendum Note (Signed)
Addended by: [name] on: 07/18/2018 11:39 AM   Modules accepted: Orders

## 2018-07-20 ENCOUNTER — Other Ambulatory Visit: Payer: Self-pay | Admitting: Family Medicine

## 2018-07-27 DIAGNOSIS — L080 Pyoderma: Secondary | ICD-10-CM | POA: Diagnosis not present

## 2018-07-27 DIAGNOSIS — L089 Local infection of the skin and subcutaneous tissue, unspecified: Secondary | ICD-10-CM | POA: Diagnosis not present

## 2018-07-27 DIAGNOSIS — L409 Psoriasis, unspecified: Secondary | ICD-10-CM | POA: Diagnosis not present

## 2018-07-27 DIAGNOSIS — L304 Erythema intertrigo: Secondary | ICD-10-CM | POA: Diagnosis not present

## 2018-08-17 DIAGNOSIS — L409 Psoriasis, unspecified: Secondary | ICD-10-CM | POA: Diagnosis not present

## 2018-08-17 DIAGNOSIS — L304 Erythema intertrigo: Secondary | ICD-10-CM | POA: Diagnosis not present

## 2018-08-23 DIAGNOSIS — R310 Gross hematuria: Secondary | ICD-10-CM | POA: Diagnosis not present

## 2018-08-23 DIAGNOSIS — R1084 Generalized abdominal pain: Secondary | ICD-10-CM | POA: Diagnosis not present

## 2018-08-23 DIAGNOSIS — N131 Hydronephrosis with ureteral stricture, not elsewhere classified: Secondary | ICD-10-CM | POA: Diagnosis not present

## 2018-08-23 DIAGNOSIS — N1330 Unspecified hydronephrosis: Secondary | ICD-10-CM | POA: Diagnosis not present

## 2018-08-23 DIAGNOSIS — R3121 Asymptomatic microscopic hematuria: Secondary | ICD-10-CM | POA: Diagnosis not present

## 2018-09-02 ENCOUNTER — Ambulatory Visit: Payer: Medicare Other | Admitting: Family Medicine

## 2018-09-02 DIAGNOSIS — R1084 Generalized abdominal pain: Secondary | ICD-10-CM | POA: Diagnosis not present

## 2018-09-02 DIAGNOSIS — N1330 Unspecified hydronephrosis: Secondary | ICD-10-CM | POA: Diagnosis not present

## 2018-09-07 ENCOUNTER — Encounter: Payer: Medicare Other | Admitting: Family Medicine

## 2018-09-07 DIAGNOSIS — M545 Low back pain: Secondary | ICD-10-CM | POA: Diagnosis not present

## 2018-09-07 DIAGNOSIS — Z6837 Body mass index (BMI) 37.0-37.9, adult: Secondary | ICD-10-CM | POA: Diagnosis not present

## 2018-09-07 DIAGNOSIS — M4180 Other forms of scoliosis, site unspecified: Secondary | ICD-10-CM | POA: Diagnosis not present

## 2018-09-07 DIAGNOSIS — M4150 Other secondary scoliosis, site unspecified: Secondary | ICD-10-CM | POA: Insufficient documentation

## 2018-09-07 DIAGNOSIS — M5416 Radiculopathy, lumbar region: Secondary | ICD-10-CM | POA: Insufficient documentation

## 2018-09-15 ENCOUNTER — Ambulatory Visit: Payer: Medicare Other | Admitting: Family Medicine

## 2018-09-15 ENCOUNTER — Telehealth: Payer: Self-pay | Admitting: Family Medicine

## 2018-09-15 NOTE — Telephone Encounter (Signed)
Called pt she report that she has went to the urologist twice received Toradol and Zofran with no relief. They referred her to Ortho stating the pain was MSK related and ortho sent back to Urologist stating pain is not MSK. She reports pain as sharp and stabbing, she has continued to work and is wanting to know what can she do to reduce pain. Please advise. Pt states she has to be at work by 2 pm today

## 2018-09-15 NOTE — Telephone Encounter (Signed)
Scheduled for 1pm tomorrow

## 2018-09-15 NOTE — Telephone Encounter (Signed)
Pt states that she is still having bladder pain and asking what can she do, please advise.

## 2018-09-15 NOTE — Telephone Encounter (Signed)
Please let her know that I have reviewed urology notes; I don't see orthopedic notes.  Urology wanted to see her back in 4 weeks.  Currently diagnosis is unclear.  Please request ortho notes. May offer OV or video visit if she would like me to evaluate her.   At this point, I would need to reevaluate.

## 2018-09-16 ENCOUNTER — Ambulatory Visit (INDEPENDENT_AMBULATORY_CARE_PROVIDER_SITE_OTHER): Payer: Medicare Other | Admitting: Family Medicine

## 2018-09-16 ENCOUNTER — Other Ambulatory Visit: Payer: Self-pay

## 2018-09-16 ENCOUNTER — Encounter: Payer: Self-pay | Admitting: Family Medicine

## 2018-09-16 VITALS — BP 128/84 | HR 60 | Temp 98.8°F | Resp 16 | Ht 63.0 in | Wt 218.8 lb

## 2018-09-16 DIAGNOSIS — M545 Low back pain, unspecified: Secondary | ICD-10-CM

## 2018-09-16 DIAGNOSIS — G8929 Other chronic pain: Secondary | ICD-10-CM

## 2018-09-16 DIAGNOSIS — R319 Hematuria, unspecified: Secondary | ICD-10-CM | POA: Diagnosis not present

## 2018-09-16 DIAGNOSIS — M797 Fibromyalgia: Secondary | ICD-10-CM

## 2018-09-16 DIAGNOSIS — F5101 Primary insomnia: Secondary | ICD-10-CM

## 2018-09-16 DIAGNOSIS — E1142 Type 2 diabetes mellitus with diabetic polyneuropathy: Secondary | ICD-10-CM | POA: Diagnosis not present

## 2018-09-16 DIAGNOSIS — F411 Generalized anxiety disorder: Secondary | ICD-10-CM | POA: Diagnosis not present

## 2018-09-16 LAB — URINALYSIS, ROUTINE W REFLEX MICROSCOPIC
BILIRUBIN URINE: NEGATIVE
Ketones, ur: NEGATIVE
Nitrite: NEGATIVE
PH: 6.5 (ref 5.0–8.0)
Specific Gravity, Urine: 1.015 (ref 1.000–1.030)
Total Protein, Urine: NEGATIVE
Urine Glucose: NEGATIVE
Urobilinogen, UA: 0.2 (ref 0.0–1.0)

## 2018-09-16 LAB — POCT URINALYSIS DIPSTICK
Bilirubin, UA: NEGATIVE
Glucose, UA: NEGATIVE
Ketones, UA: NEGATIVE
Leukocytes, UA: NEGATIVE
NITRITE UA: NEGATIVE
PROTEIN UA: NEGATIVE
Spec Grav, UA: 1.015 (ref 1.010–1.025)
Urobilinogen, UA: 0.2 E.U./dL
pH, UA: 6 (ref 5.0–8.0)

## 2018-09-16 MED ORDER — GABAPENTIN 300 MG PO CAPS: 300.0000 mg | ORAL_CAPSULE | Freq: Three times a day (TID) | ORAL | 1 refills | Status: DC

## 2018-09-16 MED ORDER — AMITRIPTYLINE HCL 25 MG PO TABS: 25.0000 mg | ORAL_TABLET | Freq: Every day | ORAL | 2 refills | Status: DC

## 2018-09-16 NOTE — Patient Instructions (Signed)
Please return in 2-3 weeks for recheck. Video visit - zoom? Or webex: please give instructions.   If you have any questions or concerns, please don't hesitate to send me a message via MyChart or call the office at [phone]. Thank you for visiting with [name] today! It's our pleasure caring for you.  We will need to get your fibromyalgia under control again: need better sleep: add elavil, wean ambien. Better pain control: increase gabapentin to  '300mg'$  three x/day. Continue tramadol as needed.  Stress reduction:   Stress Stress is a normal reaction to life events. Stress is what you feel when life demands more than you are used to, or more than you think you can handle. Some stress can be useful, such as studying for a test or meeting a deadline at work. Stress that occurs too often or for too long can cause problems. It can affect your emotional health and interfere with relationships and normal daily activities. Too much stress can weaken your body's defense system (immune system) and increase your risk for physical illness. If you already have a medical problem, stress can make it worse. What are the causes? All sorts of life events can cause stress. An event that causes stress for one person may not be stressful for another person. Major life events, whether positive or negative, commonly cause stress. Examples include:  Losing a job or starting a new job.  Losing a loved one.  Moving to a new town or home.  Getting married or divorced.  Having a baby.  Injury or illness. Less obvious life events can also cause stress, especially if they occur day after day or in combination with each other. Examples include:  Working long hours.  Driving in traffic.  Caring for children.  Being in debt.  Being in a difficult relationship. What are the signs or symptoms? Stress can cause emotional symptoms, including:  Anxiety. This is feeling worried, afraid, on edge, overwhelmed, or out of  control.  Anger, including irritation or impatience.  Depression. This is feeling sad, down, helpless, or guilty.  Trouble focusing, remembering, or making decisions. Stress can cause physical symptoms, including:  Aches and pains. These may affect your head, neck, back, stomach, or other areas of your body.  Tight muscles or a clenched jaw.  Low energy.  Trouble sleeping. Stress can cause unhealthy behaviors, including:  Eating to feel better (overeating) or skipping meals.  Working too much or putting off tasks.  Smoking, drinking alcohol, or using drugs to feel better. How is this diagnosed? Stress is diagnosed through an assessment by your health care provider. He or she may diagnose this condition based on:  Your symptoms and any stressful life events.  Your medical history.  Tests to rule out other causes of your symptoms. Depending on your condition, your health care provider may refer you to a specialist for further evaluation. How is this treated?  Stress management techniques are the recommended treatment for stress. Medicine is not typically recommended for the treatment of stress. Techniques to reduce your reaction to stressful life events include:  Stress identification. Monitor yourself for symptoms of stress and identify what causes stress for you. These skills may help you to avoid or prepare for stressful events.  Time management. Set your priorities, keep a calendar of events, and learn to say "no." Taking these actions can help you avoid making too many commitments. Techniques for coping with stress include:  Rethinking the problem. Try to think realistically  about stressful events rather than ignoring them or overreacting. Try to find the positives in a stressful situation rather than focusing on the negatives.  Exercise. Physical exercise can release both physical and emotional tension. The key is to find a form of exercise that you enjoy and do it  regularly.  Relaxation techniques. These relax the body and mind. The key is to find one or more that you enjoy and use the technique(s) regularly. Examples include: ? Meditation, deep breathing, or progressive relaxation techniques. ? Yoga or tai chi. ? Biofeedback, mindfulness techniques, or journaling. ? Listening to music, being out in nature, or participating in other hobbies.  Practicing a healthy lifestyle. Eat a balanced diet, drink plenty of water, limit or avoid caffeine, and get plenty of sleep.  Having a strong support network. Spend time with family, friends, or other people you enjoy being around. Express your feelings and talk things over with someone you trust. Counseling or talk therapy with a mental health professional may be helpful if you are having trouble managing stress on your own. Follow these instructions at home: Lifestyle   Avoid drugs.  Do not use any products that contain nicotine or tobacco, such as cigarettes and e-cigarettes. If you need help quitting, ask your health care provider.  Limit alcohol intake to no more than 1 drink a day for nonpregnant women and 2 drinks a day for men. One drink equals 12 oz of beer, 5 oz of wine, or 1 oz of hard liquor.  Do not use alcohol or drugs to relax.  Eat a balanced diet that includes fresh fruits and vegetables, whole grains, lean meats, fish, eggs, and beans, and low-fat dairy. Avoid processed foods and foods high in added fat, sugar, and salt.  Exercise at least 30 minutes on 5 or more days each week.  Get 7-8 hours of sleep each night. General instructions   Practice stress management techniques as discussed with your health care provider.  Drink enough fluid to keep your urine clear or pale yellow.  Take over-the-counter and prescription medicines only as told by your health care provider.  Keep all follow-up visits as told by your health care provider. This is important. Contact a health care  provider if:  Your symptoms get worse.  You have new symptoms.  You feel overwhelmed by your problems and can no longer manage them on your own. Get help right away if:  You have thoughts of hurting yourself or others. If you ever feel like you may hurt yourself or others, or have thoughts about taking your own life, get help right away. You can go to your nearest emergency department or call:  Your local emergency services (911 in the [place]  A suicide crisis helpline, such as the [place] at 307-633-4781. This is open 24 hours a day. Summary  Stress is a normal reaction to life events. It can cause problems if it happens too often or for too long.  Practicing stress management techniques is the best way to treat stress.  Counseling or talk therapy with a mental health professional may be helpful if you are having trouble managing stress on your own. This information is not intended to replace advice given to you by your health care provider. Make sure you discuss any questions you have with your health care provider. Document Released: 12/02/2000 Document Revised: 07/29/2016 Document Reviewed: 07/29/2016 Elsevier Interactive Patient Education  2019 [employer].

## 2018-09-16 NOTE — Progress Notes (Signed)
Subjective  CC:  Chief Complaint  Patient presents with   Bladder pain    She has been to both urology and orthopedics.. Urologist have given her Tramadol and Z-pak.. Her pain is in the middle of lower abdomen and goes to the right side    HPI: Miranda Mathis is a 68 y.o. female who presents to the office today to address the problems listed above in the chief complaint.  68 yo female with h/o recurrent renal stones and chronic low back and fibromyalgia presents due to persistent lower abdominal pain for the last 3 - 48months. She has been evaluated by urology and I have reviewed those notes: negative urology eval overall; pt thinks it is related to her bladder; however, will have episodes of pain that radiates to groin and right side/low back that will last for hours, is worse with prolonged standing, and is moderate. Partially relieved with ultram. Ortho evaluated her as well and feels her pain is in part due to DJD lumbar and possible hip joint.   Pt reports she is frustrated, tired and depressed. Hasn't slept well in part due to pain and in part due to depression and having to care for her disabled daughter who has been up multiple times per night. She is on lexapro.   ROS is positive for "soreness" all over.    Assessment  1. Fibromyalgia   2. Hematuria, unspecified type   3. DM type 2 with diabetic peripheral neuropathy (HCC) Chronic  4. Primary insomnia   5. Chronic midline low back pain without sciatica   6. GAD (generalized anxiety disorder)      Plan   pain:  Multifactorial but likely related to fibromyalgia flare in addition to low back pain. Doubt urological involvement at this time. Will recheck urine culture (last was negative at urology office). Start increaseing gabapentin, continue tramadol prn, start aquatic therapy if able per ortho, improve sleep by weaning from [place] and starting elavil. Close f/u.   Monitor mood. If improve pain and sleep, should improve mood.  Counseling done. Pt reports she feels better already.   Follow up: 2 week f/u for recheck.     Orders Placed This Encounter  Procedures   Urine Culture   Urinalysis, Routine w reflex microscopic   POCT Urinalysis Dipstick   Meds ordered this encounter  Medications   gabapentin (NEURONTIN) 300 MG capsule    Sig: Take 1 capsule (300 mg total) by mouth 3 (three) times daily.    Dispense:  270 capsule    Refill:  1   amitriptyline (ELAVIL) 25 MG tablet    Sig: Take 1 tablet (25 mg total) by mouth at bedtime.    Dispense:  30 tablet    Refill:  2      I reviewed the patients updated PMH, FH, and SocHx.    Patient Active Problem List   Diagnosis Date Noted   DM type 2 with diabetic peripheral neuropathy ([place]) 09/02/2016    Priority: High   Recurrent kidney stones 08/29/2015    Priority: High   Barrett's esophagus with esophagitis 08/29/2015    Priority: High   GAD (generalized anxiety disorder) 08/29/2015    Priority: High   Acquired hypothyroidism 11/02/2013    Priority: High   Mixed hyperlipidemia 04/04/2012    Priority: High   Fibromyalgia 10/11/2007    Priority: High   Chronic low back pain 10/11/2007    Priority: High   Psoriasis 12/02/2017  Priority: Medium   GERD (gastroesophageal reflux disease) 08/29/2015    Priority: Medium   Irritable bowel syndrome with constipation 08/29/2015    Priority: Medium   Spondylosis of lumbar region without myelopathy or radiculopathy 08/29/2015    Priority: Medium   Primary insomnia 10/25/2014    Priority: Medium   Seborrheic dermatitis of scalp 12/02/2017    Priority: Low   Allergic rhinitis 10/11/2007    Priority: Low   Current Meds  Medication Sig   acetaminophen (TYLENOL) 500 MG tablet Take 1,000 mg by mouth every 4 (four) hours as needed. pain    betamethasone dipropionate 0.05 % lotion Apply 1 application topically daily as needed.   cholecalciferol (VITAMIN D) 1000 units tablet Take 2,000  Units by mouth daily.   clotrimazole-betamethasone (LOTRISONE) cream APPLY TO AFFECTED AREA TWICE A DAY   escitalopram (LEXAPRO) 20 MG tablet TAKE 1 TABLET BY MOUTH EVERY DAY   esomeprazole (NEXIUM) 40 MG capsule Take 1 capsule (40 mg total) by mouth daily.   gabapentin (NEURONTIN) 300 MG capsule Take 1 capsule (300 mg total) by mouth 3 (three) times daily.   ketorolac (TORADOL) 10 MG tablet Take 1 tablet (10 mg total) by mouth every 6 (six) hours as needed.   levothyroxine (SYNTHROID, LEVOTHROID) 137 MCG tablet Take 1 tablet (137 mcg total) by mouth daily before breakfast.   lisinopril (PRINIVIL,ZESTRIL) 5 MG tablet Take 1 tablet (5 mg total) by mouth daily.   meloxicam (MOBIC) 15 MG tablet Take 1 tablet (15 mg total) by mouth daily.   pravastatin (PRAVACHOL) 40 MG tablet TAKE 1 TABLET DAILY   senna-docusate (SENOKOT-S) 8.6-50 MG tablet Take 1-2 tablets by mouth every evening.   zolpidem (AMBIEN) 10 MG tablet TAKE 1 TABLET BY MOUTH EVERY DAY AT BEDTIME AS NEEDED FOR SLEEP   [DISCONTINUED] gabapentin (NEURONTIN) 300 MG capsule Take 1 capsule (300 mg total) by mouth at bedtime.   [DISCONTINUED] tamsulosin (FLOMAX) 0.4 MG CAPS capsule Take 1 capsule (0.4 mg total) by mouth daily.    Allergies: Patient is allergic to morphine. Family History: Patient family history includes Arthritis in her brother, daughter, father, maternal grandmother, and mother; Asthma in her daughter; Birth defects in her daughter and father; COPD in her mother; Cancer in her father; Coronary artery disease in her paternal grandfather; Depression in her daughter; Diabetes in her paternal aunt; Emphysema in her mother; Hearing loss in her daughter; Hyperlipidemia in her brother; Hypertension in her father and mother; Kidney Stones in her daughter; Mental retardation in her daughter; Osteoarthritis in her mother; Prostate cancer in her father; Rheum arthritis in her mother. Social History:  Patient  reports that  she has never smoked. She has never used smokeless tobacco. She reports current alcohol use of about 1.0 standard drinks of alcohol per week. She reports that she does not use drugs.  Review of Systems: Constitutional: Negative for fever malaise or anorexia Cardiovascular: negative for chest pain Respiratory: negative for SOB or persistent cough Gastrointestinal: negative for abdominal pain  Objective  Vitals: BP 128/84    Pulse 60    Temp 98.8 F (37.1 C) (Oral)    Resp 16    Ht 5\' 3"  (1.6 m)    Wt 218 lb 12.8 oz (99.2 kg)    SpO2 96%    BMI 38.76 kg/m  General: no acute distress , A&Ox3 Psych: irritable and frustrated Back: multiple positive trigger tender points Neg seated SLR; no flank ttp Abdomen: diffuse mild ttp with suprapubic and  epigastric worse w/o rebound, guarding or mass. Soft, nl bowel sounds Skin:  Warm, no rashes  Office Visit on 09/16/2018  Component Date Value Ref Range Status   Color, UA 09/16/2018 yellow   Final   Clarity, UA 09/16/2018 clear   Final   Glucose, UA 09/16/2018 Negative  Negative Final   Bilirubin, UA 09/16/2018 negative   Final   Ketones, UA 09/16/2018 negative   Final   Spec Grav, UA 09/16/2018 1.015  1.010 - 1.025 Final   Blood, UA 09/16/2018 3+   Final   pH, UA 09/16/2018 6.0  5.0 - 8.0 Final   Protein, UA 09/16/2018 Negative  Negative Final   Urobilinogen, UA 09/16/2018 0.2  0.2 or 1.0 E.U./dL Final   Nitrite, UA 09/16/2018 negative   Final   Leukocytes, UA 09/16/2018 Negative  Negative Final   Color, Urine 09/16/2018 YELLOW  Yellow;Lt. Yellow;Straw;Dark Yellow;Amber;Green;Red;Brown Final   APPearance 09/16/2018 CLEAR  Clear;Turbid;Slightly Cloudy;Cloudy Final   Specific Gravity, Urine 09/16/2018 1.015  1.000 - 1.030 Final   pH 09/16/2018 6.5  5.0 - 8.0 Final   Total Protein, Urine 09/16/2018 NEGATIVE  Negative Final   Urine Glucose 09/16/2018 NEGATIVE  Negative Final   Ketones, ur 09/16/2018 NEGATIVE  Negative Final     Bilirubin Urine 09/16/2018 NEGATIVE  Negative Final   Hgb urine dipstick 09/16/2018 LARGE* Negative Final   Urobilinogen, UA 09/16/2018 0.2  0.0 - 1.0 Final   Leukocytes,Ua 09/16/2018 SMALL* Negative Final   Nitrite 09/16/2018 NEGATIVE  Negative Final   WBC, UA 09/16/2018 3-6/hpf* 0-2/hpf Final   RBC / HPF 09/16/2018 21-50/hpf* 0-2/hpf Final   Squamous Epithelial / LPF 09/16/2018 Rare(0-4/hpf)  Rare(0-4/hpf) Final      Commons side effects, risks, benefits, and alternatives for medications and treatment plan prescribed today were discussed, and the patient expressed understanding of the given instructions. Patient is instructed to call or message via MyChart if he/she has any questions or concerns regarding our treatment plan. No barriers to understanding were identified. We discussed Red Flag symptoms and signs in detail. Patient expressed understanding regarding what to do in case of urgent or emergency type symptoms.   Medication list was reconciled, printed and provided to the patient in AVS. Patient instructions and summary information was reviewed with the patient as documented in the AVS. This note was prepared with assistance of Dragon voice recognition software. Occasional wrong-word or sound-a-like substitutions may have occurred due to the inherent limitations of voice recognition software

## 2018-09-17 LAB — URINE CULTURE
MICRO NUMBER:: 359110
SPECIMEN QUALITY:: ADEQUATE

## 2018-09-19 NOTE — Progress Notes (Signed)
Labs reviewed.negative urine as expected. No call warranted

## 2018-09-20 ENCOUNTER — Encounter: Payer: Medicare Other | Admitting: Family Medicine

## 2018-09-22 ENCOUNTER — Other Ambulatory Visit: Payer: Self-pay | Admitting: *Deleted

## 2018-09-22 MED ORDER — OMEPRAZOLE 40 MG PO CPDR: 40.0000 mg | DELAYED_RELEASE_CAPSULE | Freq: Every day | ORAL | 1 refills | Status: DC

## 2018-10-05 ENCOUNTER — Encounter: Payer: Self-pay | Admitting: Family Medicine

## 2018-10-05 ENCOUNTER — Ambulatory Visit (INDEPENDENT_AMBULATORY_CARE_PROVIDER_SITE_OTHER): Payer: Medicare Other | Admitting: Family Medicine

## 2018-10-05 ENCOUNTER — Other Ambulatory Visit: Payer: Self-pay

## 2018-10-05 DIAGNOSIS — M797 Fibromyalgia: Secondary | ICD-10-CM | POA: Diagnosis not present

## 2018-10-05 DIAGNOSIS — M545 Low back pain, unspecified: Secondary | ICD-10-CM

## 2018-10-05 DIAGNOSIS — F411 Generalized anxiety disorder: Secondary | ICD-10-CM

## 2018-10-05 DIAGNOSIS — G8929 Other chronic pain: Secondary | ICD-10-CM | POA: Diagnosis not present

## 2018-10-05 DIAGNOSIS — F5101 Primary insomnia: Secondary | ICD-10-CM | POA: Diagnosis not present

## 2018-10-05 NOTE — Progress Notes (Signed)
Virtual Visit via Video Note  Subjective  CC:  Chief Complaint  Patient presents with  . Follow-up    HPI:  I connected with Weston R Volcy on 10/05/18 at  9:20 AM EDT by a video enabled telemedicine application and verified that I am speaking with the correct person using two identifiers. Location patient: Home Location provider: [facility], Office Persons participating in the virtual visit: Rupal R Sauve, Leamon Arnt, MD Lilli Light, [place] discussed the limitations of evaluation and management by telemedicine and the availability of in person appointments. The patient expressed understanding and agreed to proceed. . Patient is a 68 y.o. @GENDER @ who presents via video conferencing with the following concerns: o Follow-up fibromyalgia: 2-week follow-up since last visit.  Patient was experiencing several months of worsening lower abdominal pain and low back pain.  Please refer to that note.  My evaluation seem most consistent with fibromyalgia flare due to stressors.  Increase gabapentin to 3 times daily and added Elavil for pain and sleep.  Fortunately, she reports she is doing much better.  Elavil has helped sleep significantly.  No side effects.  She is still able to wake up with her daughter in the middle night if needed.  Gabapentin also has been helping decrease her lower back pain and also some paresthesias that she has been experiencing in her feet.  No adverse effects.  No new symptoms.  Abdominal pain is much less frequent and less severe.  Quality is the same.  Only intermittent at this time.  No new symptoms. o Lumbar DJD: Has follow-up with orthopedics.  Unable to try aquatic therapy at this time due to COVID-19 epidemic.  However, improved as noted above. o Anxiety: Doing well.  Coping with stressors better.  Seems to be in a better mind space.  No active depression symptoms. Assessment  1. Fibromyalgia   2. Primary insomnia   3. Chronic midline low  back pain without sciatica   4. GAD (generalized anxiety disorder)      Plan   Fibromyalgia flare with abdominal pain and back pain: Much improved.  Continue gabapentin and Elavil at current doses.  Will recheck in about 6 to 12 weeks.  Can adjust dosing if needed.  Continue stress management  Recommend home back exercises  Elavil 25 mg nightly for sleep I discussed the assessment and treatment plan with the patient. The patient was provided an opportunity to ask questions and all were answered. The patient agreed with the plan and demonstrated an understanding of the instructions.   The patient was advised to call back or seek an in-person evaluation if the symptoms worsen or if the condition fails to improve as anticipated. Follow up: Return for as scheduled, complete physical.  10/27/2018  No orders of the defined types were placed in this encounter.     I reviewed the patients updated PMH, FH, and SocHx.    Patient Active Problem List   Diagnosis Date Noted  . DM type 2 with diabetic peripheral neuropathy ([place]) 09/02/2016    Priority: High  . Recurrent kidney stones 08/29/2015    Priority: High  . Barrett's esophagus with esophagitis 08/29/2015    Priority: High  . GAD (generalized anxiety disorder) 08/29/2015    Priority: High  . Acquired hypothyroidism 11/02/2013    Priority: High  . Mixed hyperlipidemia 04/04/2012    Priority: High  . Fibromyalgia 10/11/2007    Priority: High  . Chronic  low back pain 10/11/2007    Priority: High  . Psoriasis 12/02/2017    Priority: Medium  . GERD (gastroesophageal reflux disease) 08/29/2015    Priority: Medium  . Irritable bowel syndrome with constipation 08/29/2015    Priority: Medium  . Spondylosis of lumbar region without myelopathy or radiculopathy 08/29/2015    Priority: Medium  . Primary insomnia 10/25/2014    Priority: Medium  . Seborrheic dermatitis of scalp 12/02/2017    Priority: Low  . Allergic rhinitis 10/11/2007     Priority: Low   Current Meds  Medication Sig  . acetaminophen (TYLENOL) 500 MG tablet Take 1,000 mg by mouth every 4 (four) hours as needed. pain   . amitriptyline (ELAVIL) 25 MG tablet Take 1 tablet (25 mg total) by mouth at bedtime.  . betamethasone dipropionate 0.05 % lotion Apply 1 application topically daily as needed.  . cholecalciferol (VITAMIN D) 1000 units tablet Take 2,000 Units by mouth daily.  . clotrimazole-betamethasone (LOTRISONE) cream APPLY TO AFFECTED AREA TWICE A DAY  . escitalopram (LEXAPRO) 20 MG tablet TAKE 1 TABLET BY MOUTH EVERY DAY  . gabapentin (NEURONTIN) 300 MG capsule Take 1 capsule (300 mg total) by mouth 3 (three) times daily.  [name] ketoconazole (NIZORAL) 2 % shampoo Apply 1 application topically 2 (two) times a week.  [name] ketorolac (TORADOL) 10 MG tablet Take 1 tablet (10 mg total) by mouth every 6 (six) hours as needed.  [name] levothyroxine (SYNTHROID, LEVOTHROID) 137 MCG tablet Take 1 tablet (137 mcg total) by mouth daily before breakfast.  . lisinopril (PRINIVIL,ZESTRIL) 5 MG tablet Take 1 tablet (5 mg total) by mouth daily.  . meloxicam (MOBIC) 15 MG tablet Take 1 tablet (15 mg total) by mouth daily.  [name] omeprazole (PRILOSEC) 40 MG capsule Take 1 capsule (40 mg total) by mouth daily.  . pravastatin (PRAVACHOL) 40 MG tablet TAKE 1 TABLET DAILY  . senna-docusate (SENOKOT-S) 8.6-50 MG tablet Take 1-2 tablets by mouth every evening.  . zolpidem (AMBIEN) 10 MG tablet TAKE 1 TABLET BY MOUTH EVERY DAY AT BEDTIME AS NEEDED FOR SLEEP    Allergies: Patient is allergic to morphine. Family History: Patient family history includes Arthritis in her brother, daughter, father, maternal grandmother, and mother; Asthma in her daughter; Birth defects in her daughter and father; COPD in her mother; Cancer in her father; Coronary artery disease in her paternal grandfather; Depression in her daughter; Diabetes in her paternal aunt; Emphysema in her mother; Hearing loss in her  daughter; Hyperlipidemia in her brother; Hypertension in her father and mother; Kidney Stones in her daughter; Mental retardation in her daughter; Osteoarthritis in her mother; Prostate cancer in her father; Rheum arthritis in her mother. Social History:  Patient  reports that she has never smoked. She has never used smokeless tobacco. She reports current alcohol use of about 1.0 standard drinks of alcohol per week. She reports that she does not use drugs.  Review of Systems: Constitutional: Negative for fever malaise or anorexia Cardiovascular: negative for chest pain Respiratory: negative for SOB or persistent cough Gastrointestinal: negative for abdominal pain  OBJECTIVE Vitals: There were no vitals taken for this visit.  Patient reports she is afebrile General: no acute distress , A&Ox3 Psych: Normal speech, normal affect, appears happier, relaxed Leamon Arnt, MD

## 2018-10-05 NOTE — Progress Notes (Signed)
I have discussed the procedure for the virtual visit with the patient who has given consent to proceed with assessment and treatment.   Tiara S Simmons, CMA     

## 2018-10-05 NOTE — Patient Instructions (Addendum)
Please follow as scheduled for CPE. 10/27/2018  AWV is due at your convenience.   If you have any questions or concerns, please don't hesitate to send me a message via MyChart or call the office at [phone]. Thank you for visiting with [name] today! It's our pleasure caring for you.

## 2018-10-08 ENCOUNTER — Other Ambulatory Visit: Payer: Self-pay | Admitting: Family Medicine

## 2018-10-18 ENCOUNTER — Encounter (HOSPITAL_BASED_OUTPATIENT_CLINIC_OR_DEPARTMENT_OTHER): Payer: Self-pay | Admitting: *Deleted

## 2018-10-18 ENCOUNTER — Emergency Department (HOSPITAL_BASED_OUTPATIENT_CLINIC_OR_DEPARTMENT_OTHER): Payer: Medicare Other

## 2018-10-18 ENCOUNTER — Other Ambulatory Visit: Payer: Self-pay

## 2018-10-18 ENCOUNTER — Emergency Department (HOSPITAL_BASED_OUTPATIENT_CLINIC_OR_DEPARTMENT_OTHER)
Admission: EM | Admit: 2018-10-18 | Discharge: 2018-10-18 | Disposition: A | Discharge status: Home / Self Care | Payer: Medicare Other | Source: Home / Self Care | Attending: Emergency Medicine | Admitting: Emergency Medicine

## 2018-10-18 DIAGNOSIS — N1339 Other hydronephrosis: Secondary | ICD-10-CM | POA: Insufficient documentation

## 2018-10-18 DIAGNOSIS — E039 Hypothyroidism, unspecified: Secondary | ICD-10-CM

## 2018-10-18 DIAGNOSIS — N1330 Unspecified hydronephrosis: Secondary | ICD-10-CM | POA: Diagnosis not present

## 2018-10-18 DIAGNOSIS — R7881 Bacteremia: Secondary | ICD-10-CM | POA: Diagnosis not present

## 2018-10-18 DIAGNOSIS — N179 Acute kidney failure, unspecified: Secondary | ICD-10-CM

## 2018-10-18 DIAGNOSIS — E1122 Type 2 diabetes mellitus with diabetic chronic kidney disease: Secondary | ICD-10-CM | POA: Insufficient documentation

## 2018-10-18 DIAGNOSIS — Z79899 Other long term (current) drug therapy: Secondary | ICD-10-CM

## 2018-10-18 DIAGNOSIS — M5136 Other intervertebral disc degeneration, lumbar region: Secondary | ICD-10-CM | POA: Diagnosis not present

## 2018-10-18 DIAGNOSIS — R1031 Right lower quadrant pain: Secondary | ICD-10-CM

## 2018-10-18 DIAGNOSIS — R000 Tachycardia, unspecified: Secondary | ICD-10-CM | POA: Diagnosis not present

## 2018-10-18 DIAGNOSIS — A419 Sepsis, unspecified organism: Secondary | ICD-10-CM | POA: Diagnosis not present

## 2018-10-18 DIAGNOSIS — N189 Chronic kidney disease, unspecified: Secondary | ICD-10-CM | POA: Insufficient documentation

## 2018-10-18 DIAGNOSIS — N131 Hydronephrosis with ureteral stricture, not elsewhere classified: Secondary | ICD-10-CM | POA: Diagnosis not present

## 2018-10-18 DIAGNOSIS — Z20828 Contact with and (suspected) exposure to other viral communicable diseases: Secondary | ICD-10-CM | POA: Diagnosis not present

## 2018-10-18 LAB — URINALYSIS, ROUTINE W REFLEX MICROSCOPIC
Bilirubin Urine: NEGATIVE
Glucose, UA: NEGATIVE mg/dL
Ketones, ur: NEGATIVE mg/dL
Nitrite: NEGATIVE
Protein, ur: 100 mg/dL — AB
Specific Gravity, Urine: 1.01 (ref 1.005–1.030)
pH: 8 (ref 5.0–8.0)

## 2018-10-18 LAB — COMPREHENSIVE METABOLIC PANEL
ALT: 26 U/L (ref 0–44)
AST: 26 U/L (ref 15–41)
Albumin: 3.6 g/dL (ref 3.5–5.0)
Alkaline Phosphatase: 77 U/L (ref 38–126)
Anion gap: 11 (ref 5–15)
BUN: 17 mg/dL (ref 8–23)
CO2: 21 mmol/L — ABNORMAL LOW (ref 22–32)
Calcium: 8.7 mg/dL — ABNORMAL LOW (ref 8.9–10.3)
Chloride: 101 mmol/L (ref 98–111)
Creatinine, Ser: 1.57 mg/dL — ABNORMAL HIGH (ref 0.44–1.00)
GFR calc Af Amer: 39 mL/min — ABNORMAL LOW (ref >60)
GFR calc non Af Amer: 34 mL/min — ABNORMAL LOW (ref >60)
Glucose, Bld: 178 mg/dL — ABNORMAL HIGH (ref 70–99)
Potassium: 3.9 mmol/L (ref 3.5–5.1)
Sodium: 133 mmol/L — ABNORMAL LOW (ref 135–145)
Total Bilirubin: 0.7 mg/dL (ref 0.3–1.2)
Total Protein: 7.3 g/dL (ref 6.5–8.1)

## 2018-10-18 LAB — CBC WITH DIFFERENTIAL/PLATELET
Abs Immature Granulocytes: 0.05 10*3/uL (ref 0.00–0.07)
Basophils Absolute: 0 10*3/uL (ref 0.0–0.1)
Basophils Relative: 0 %
Eosinophils Absolute: 0 10*3/uL (ref 0.0–0.5)
Eosinophils Relative: 0 %
HCT: 38.2 % (ref 36.0–46.0)
Hemoglobin: 12.3 g/dL (ref 12.0–15.0)
Immature Granulocytes: 1 %
Lymphocytes Relative: 8 %
Lymphs Abs: 0.7 10*3/uL (ref 0.7–4.0)
MCH: 28.8 pg (ref 26.0–34.0)
MCHC: 32.2 g/dL (ref 30.0–36.0)
MCV: 89.5 fL (ref 80.0–100.0)
Monocytes Absolute: 0.9 10*3/uL (ref 0.1–1.0)
Monocytes Relative: 10 %
Neutro Abs: 7.4 10*3/uL (ref 1.7–7.7)
Neutrophils Relative %: 81 %
Platelets: 141 10*3/uL — ABNORMAL LOW (ref 150–400)
RBC: 4.27 MIL/uL (ref 3.87–5.11)
RDW: 14 % (ref 11.5–15.5)
WBC: 9.1 10*3/uL (ref 4.0–10.5)
nRBC: 0 % (ref 0.0–0.2)

## 2018-10-18 LAB — LACTIC ACID, PLASMA: Lactic Acid, Venous: 3.2 mmol/L (ref 0.5–1.9)

## 2018-10-18 LAB — URINALYSIS, MICROSCOPIC (REFLEX)

## 2018-10-18 MED ORDER — ACETAMINOPHEN 500 MG PO TABS
1000.0000 mg | ORAL_TABLET | Freq: Once | ORAL | Status: AC
  Administered 2018-10-18: 17:00:00 1000 mg via ORAL

## 2018-10-18 MED ORDER — ACETAMINOPHEN 500 MG PO TABS
ORAL_TABLET | ORAL | Status: AC
  Filled 2018-10-18: qty 2

## 2018-10-18 MED ORDER — HYDROMORPHONE HCL 1 MG/ML IJ SOLN
0.5000 mg | Freq: Once | INTRAMUSCULAR | Status: AC
  Administered 2018-10-18: 17:00:00 0.5 mg via INTRAVENOUS
  Filled 2018-10-18: qty 1

## 2018-10-18 MED ORDER — SODIUM CHLORIDE 0.9 % IV BOLUS
1000.0000 mL | Freq: Once | INTRAVENOUS | Status: AC
  Administered 2018-10-18: 1000 mL via INTRAVENOUS

## 2018-10-18 MED ORDER — ONDANSETRON 4 MG PO TBDP: ORAL_TABLET | ORAL | 0 refills | Status: DC

## 2018-10-18 MED ORDER — HYDROMORPHONE HCL 1 MG/ML IJ SOLN
0.5000 mg | Freq: Once | INTRAMUSCULAR | Status: AC
  Administered 2018-10-18: 18:00:00 0.5 mg via INTRAVENOUS
  Filled 2018-10-18: qty 1

## 2018-10-18 MED ORDER — MORPHINE SULFATE 15 MG PO TABS: 15.0000 mg | ORAL_TABLET | ORAL | 0 refills | Status: DC | PRN

## 2018-10-18 MED ORDER — ONDANSETRON HCL 4 MG/2ML IJ SOLN
4.0000 mg | Freq: Once | INTRAMUSCULAR | Status: AC
  Administered 2018-10-18: 4 mg via INTRAVENOUS
  Filled 2018-10-18: qty 2

## 2018-10-18 NOTE — ED Notes (Signed)
Pt crying in pain. VSS. MD aware. C/o right lower flank pain.

## 2018-10-18 NOTE — ED Triage Notes (Signed)
C/o dysuria x 3 days

## 2018-10-18 NOTE — ED Provider Notes (Addendum)
[facility] EMERGENCY DEPARTMENT Provider Note   CSN: [id number] Arrival date & time: 10/18/18  1632    History   Chief Complaint Chief Complaint  Patient presents with  . Dysuria    HPI Miranda Mathis is a 68 y.o. female.     68 yo F with a chief complaint of right lower quadrant abdominal pain.  This described sharp and stabbing.  Is been going on for about 3 days now.  She has been drinking but is not felt like eating.  Pain seems to come and go.  She has a history of kidney stones in the past is not for sure if it feels the same.  She has had a C-section in the past but denies other abdominal surgery.  Denies dysuria increased frequency or hesitancy.  Denies flank pain.  Has had some nausea and vomiting with this.  The history is provided by the patient.  Dysuria  Associated symptoms: abdominal pain   Associated symptoms: no fever, no nausea and no vomiting   Abdominal Pain  Pain location:  RLQ Pain quality: sharp and shooting   Pain radiates to:  Does not radiate Pain severity:  Severe Onset quality:  Sudden Duration:  3 days Timing:  Constant Progression:  Worsening Chronicity:  New Relieved by:  Nothing Worsened by:  Nothing Ineffective treatments:  None tried Associated symptoms: dysuria   Associated symptoms: no chest pain, no chills, no fever, no nausea, no shortness of breath and no vomiting     Past Medical History:  Diagnosis Date  . Allergic rhinitis, cause unspecified   . Barrett's esophagus   . Chicken pox   . Chronic back pain   . Chronic kidney disease    Stones  . Colon polyps   . DDD (degenerative disc disease), lumbar   . Depression   . Fibromyalgia   . GERD (gastroesophageal reflux disease)   . Headache(784.0)   . History of UTI   . Hyperlipidemia   . Hypothyroid 11/02/2013 dx  . IBS (irritable bowel syndrome)   . Stone, bladder   . Ureteral stricture, right     Patient Active Problem List   Diagnosis Date Noted  .  Seborrheic dermatitis of scalp 12/02/2017  . Psoriasis 12/02/2017  . DM type 2 with diabetic peripheral neuropathy ([place]) 09/02/2016  . Recurrent kidney stones 08/29/2015  . Barrett's esophagus with esophagitis 08/29/2015  . GAD (generalized anxiety disorder) 08/29/2015  . GERD (gastroesophageal reflux disease) 08/29/2015  . Irritable bowel syndrome with constipation 08/29/2015  . Spondylosis of lumbar region without myelopathy or radiculopathy 08/29/2015  . Primary insomnia 10/25/2014  . Acquired hypothyroidism 11/02/2013  . Mixed hyperlipidemia 04/04/2012  . Allergic rhinitis 10/11/2007  . Fibromyalgia 10/11/2007  . Chronic low back pain 10/11/2007    Past Surgical History:  Procedure Laterality Date  . ABDOMINAL HYSTERECTOMY  1987  . BALLOON DILATION  07/10/2011   Procedure: BALLOON DILATION;  Surgeon: Fredricka Bonine, MD;  Location: [facility];  Service: Urology;  Laterality: Right;  . CESAREAN SECTION  X2  . CYSTO/ BILATERAL URETEROSCOPY / URETERAL BX'S/ BILATERAL URETERAL STENT PLACEMENT/ BLADDER STONE EXTRACTION  03-13-2011  . CYSTOSCOPY W/ RETROGRADES  07/10/2011   Procedure: CYSTOSCOPY WITH RETROGRADE PYELOGRAM;  Surgeon: Fredricka Bonine, MD;  Location: [facility];  Service: Urology;  Laterality: Right;  . CYSTOSCOPY W/ URETERAL STENT PLACEMENT  07/10/2011   Procedure: CYSTOSCOPY WITH STENT REPLACEMENT;  Surgeon: Fredricka Bonine, MD;  Location: [place];  Service: Urology;  Laterality: Right;  . CYSTOSCOPY W/ URETERAL STENT REMOVAL  07/10/2011   Procedure: CYSTOSCOPY WITH STENT REMOVAL;  Surgeon: Fredricka Bonine, MD;  Location: [facility];  Service: Urology;  Laterality: Right;  . GALLBLADDER SURGERY    . LUMBAR MICRODISCECTOMY  1990'S   L5 - S1  . RIGHT FOOT SURG  2003   HEEL  . RIGHT URETEROSCOPIC / URETERAL BX/ STENT PLACEMENT  04-17-2011     OB History   No obstetric  history on file.      Home Medications    Prior to Admission medications   Medication Sig Start Date End Date Taking? Authorizing Provider  acetaminophen (TYLENOL) 500 MG tablet Take 1,000 mg by mouth every 4 (four) hours as needed. pain    Yes [provider]  amitriptyline (ELAVIL) 25 MG tablet TAKE 1 TABLET BY MOUTH EVERYDAY AT BEDTIME 10/08/18   Leamon Arnt, MD  betamethasone dipropionate 0.05 % lotion Apply 1 application topically daily as needed. 12/02/17   Leamon Arnt, MD  cholecalciferol (VITAMIN D) 1000 units tablet Take 2,000 Units by mouth daily.    [provider]  clotrimazole-betamethasone (LOTRISONE) cream APPLY TO AFFECTED AREA TWICE A DAY 11/10/17   [provider]  escitalopram (LEXAPRO) 20 MG tablet TAKE 1 TABLET BY MOUTH EVERY DAY 06/28/18   Leamon Arnt, MD  gabapentin (NEURONTIN) 300 MG capsule Take 1 capsule (300 mg total) by mouth 3 (three) times daily. 09/16/18   Leamon Arnt, MD  ketoconazole (NIZORAL) 2 % shampoo Apply 1 application topically 2 (two) times a week. 05/29/16   [provider]  ketorolac (TORADOL) 10 MG tablet Take 1 tablet (10 mg total) by mouth every 6 (six) hours as needed. 06/13/18   Leamon Arnt, MD  levothyroxine (SYNTHROID, LEVOTHROID) 137 MCG tablet Take 1 tablet (137 mcg total) by mouth daily before breakfast. 12/02/17   Leamon Arnt, MD  lisinopril (PRINIVIL,ZESTRIL) 5 MG tablet Take 1 tablet (5 mg total) by mouth daily. 06/03/18   Leamon Arnt, MD  meloxicam (MOBIC) 15 MG tablet Take 1 tablet (15 mg total) by mouth daily. 12/02/17   Leamon Arnt, MD  morphine (MSIR) 15 MG tablet Take 1 tablet (15 mg total) by mouth every 4 (four) hours as needed for severe pain. 10/18/18   Deno Etienne, DO  omeprazole (PRILOSEC) 40 MG capsule Take 1 capsule (40 mg total) by mouth daily. 09/22/18   Leamon Arnt, MD  ondansetron (ZOFRAN ODT) 4 MG disintegrating tablet 4mg  ODT q4 hours prn nausea/vomit 10/18/18    Deno Etienne, DO  pravastatin (PRAVACHOL) 40 MG tablet TAKE 1 TABLET DAILY 09/28/17   [provider]  senna-docusate (SENOKOT-S) 8.6-50 MG tablet Take 1-2 tablets by mouth every evening.    [provider]  zolpidem (AMBIEN) 10 MG tablet TAKE 1 TABLET BY MOUTH EVERY DAY AT BEDTIME AS NEEDED FOR SLEEP 07/18/18   Leamon Arnt, MD    Family History Family History  Problem Relation Age of Onset  . COPD Mother   . Emphysema Mother   . Osteoarthritis Mother   . Rheum arthritis Mother   . Arthritis Mother   . Hypertension Mother   . Prostate cancer Father        w/mets  . Arthritis Father   . Birth defects Father   . Cancer Father   . Hypertension Father   . Arthritis  Daughter   . Asthma Daughter   . Birth defects Daughter   . Depression Daughter   . Hearing loss Daughter   . Mental retardation Daughter   . Kidney Stones Daughter   . Arthritis Brother   . Hyperlipidemia Brother   . Coronary artery disease Paternal Grandfather   . Diabetes Paternal Aunt        x 3  . Arthritis Maternal Grandmother   . Colon cancer Neg Hx     Social History Social History   Tobacco Use  . Smoking status: Never Smoker  . Smokeless tobacco: Never Used  Substance Use Topics  . Alcohol use: Yes    Alcohol/week: 1.0 standard drinks    Types: 1 Standard drinks or equivalent per week    Comment: rarely  . Drug use: No     Allergies   Morphine   Review of Systems Review of Systems  Constitutional: Negative for chills and fever.  HENT: Negative for congestion and rhinorrhea.   Eyes: Negative for redness and visual disturbance.  Respiratory: Negative for shortness of breath and wheezing.   Cardiovascular: Negative for chest pain and palpitations.  Gastrointestinal: Positive for abdominal pain. Negative for nausea and vomiting.  Genitourinary: Positive for dysuria. Negative for urgency.  Musculoskeletal: Negative for arthralgias and myalgias.  Skin: Negative for pallor  and wound.  Neurological: Negative for dizziness and headaches.     Physical Exam Updated Vital Signs BP 130/82 (BP Location: Right Arm)   Pulse 88   Temp (!) 101.2 F (38.4 C) (Oral)   Resp 18   Ht 5\' 2"  (1.575 m)   Wt 95.3 kg   SpO2 100%   BMI 38.41 kg/m   Physical Exam Vitals signs and nursing note reviewed.  Constitutional:      General: She is not in acute distress.    Appearance: She is well-developed. She is not diaphoretic.  HENT:     Head: Normocephalic and atraumatic.  Eyes:     Pupils: Pupils are equal, round, and reactive to light.  Neck:     Musculoskeletal: Normal range of motion and neck supple.  Cardiovascular:     Rate and Rhythm: Normal rate and regular rhythm.     Heart sounds: No murmur. No friction rub. No gallop.   Pulmonary:     Effort: Pulmonary effort is normal.     Breath sounds: No wheezing or rales.  Abdominal:     General: There is no distension.     Palpations: Abdomen is soft.     Tenderness: There is abdominal tenderness. There is guarding.     Comments: Tenderness to the right lower quadrant.  Negative rebound.  Musculoskeletal:        General: No tenderness.  Skin:    General: Skin is warm and dry.  Neurological:     Mental Status: She is alert and oriented to person, place, and time.  Psychiatric:        Behavior: Behavior normal.      ED Treatments / Results  Labs (all labs ordered are listed, but only abnormal results are displayed) Labs Reviewed  COMPREHENSIVE METABOLIC PANEL - Abnormal; Notable for the following components:      Result Value   Sodium 133 (*)    CO2 21 (*)    Glucose, Bld 178 (*)    Creatinine, Ser 1.57 (*)    Calcium 8.7 (*)    GFR calc non Af Amer 34 (*)    GFR  calc Af Amer 39 (*)    All other components within normal limits  CBC WITH DIFFERENTIAL/PLATELET - Abnormal; Notable for the following components:   Platelets 141 (*)    All other components within normal limits  URINALYSIS, ROUTINE W  REFLEX MICROSCOPIC - Abnormal; Notable for the following components:   Hgb urine dipstick SMALL (*)    Protein, ur 100 (*)    Leukocytes,Ua SMALL (*)    All other components within normal limits  LACTIC ACID, PLASMA - Abnormal; Notable for the following components:   Lactic Acid, Venous 3.2 (*)    All other components within normal limits  URINALYSIS, MICROSCOPIC (REFLEX) - Abnormal; Notable for the following components:   Bacteria, UA RARE (*)    All other components within normal limits  CULTURE, BLOOD (ROUTINE X 2)  CULTURE, BLOOD (ROUTINE X 2)  URINE CULTURE  LACTIC ACID, PLASMA    EKG EKG Interpretation  Date/Time:  Tuesday October 18 2018 17:28:28 EDT Ventricular Rate:  100 PR Interval:    QRS Duration: 78 QT Interval:  381 QTC Calculation: 492 R Axis:   52 Text Interpretation:  Sinus tachycardia Low voltage, precordial leads Abnormal R-wave progression, early transition Borderline T wave abnormalities Borderline prolonged QT interval No significant change since last tracing Confirmed by Deno Etienne [phone]) on 10/18/2018 5:48:35 PM   Radiology Dg Chest 2 View  Result Date: 10/18/2018 CLINICAL DATA:  Sepsis EXAM: CHEST - 2 VIEW COMPARISON:  11/03/2015 FINDINGS: The heart size and mediastinal contours are within normal limits. Both lungs are clear. The visualized skeletal structures are unremarkable. IMPRESSION: No active cardiopulmonary disease. Electronically Signed   By: Franchot Gallo M.D.   On: 10/18/2018 18:32   Ct Renal Stone Study  Result Date: 10/18/2018 CLINICAL DATA:  Right flank pain.  Nausea vomiting fever EXAM: CT ABDOMEN AND PELVIS WITHOUT CONTRAST TECHNIQUE: Multidetector CT imaging of the abdomen and pelvis was performed following the standard protocol without IV contrast. COMPARISON:  CT abdomen pelvis 08/23/2018 FINDINGS: Lower chest: Lung bases clear bilaterally. Hepatobiliary: Postop cholecystectomy without biliary dilatation. Fatty liver with enlargement of  the left lobe of the liver unchanged. No focal liver lesion. Pancreas: Negative Spleen: Negative Adrenals/Urinary Tract: Mild right hydronephrosis. Proximal right ureter dilated. No obstructing stone. Punctate right upper pole stone seen on the prior study no longer present apparently has passed in the interval. Moderate left hydronephrosis and moderate dilatation of the left ureter with transition at the iliac crossing. No stone or mass identified. Left hydroureter appears to have progressed since the prior CT. Stomach/Bowel: Negative for bowel obstruction. Negative for bowel mass or edema. Normal appendix. Vascular/Lymphatic: Negative Reproductive: Hysterectomy.  No pelvic mass Other: No free fluid Musculoskeletal: Disc degeneration and spurring L4-5 and L5-S1. No acute skeletal abnormality. IMPRESSION: 1. Moderate left hydronephrosis with mild progression since 08/23/2018. Transition at the iliac crossing. No stone identified. Urologic evaluation recommended to rule out stricture or mass. 2. Mild right hydronephrosis. No obstructing stone seen today. Previously noted punctate stone right upper pole has passed since the prior CT. 3. Hepatomegaly with fatty infiltration. Electronically Signed   By: Franchot Gallo M.D.   On: 10/18/2018 18:39    Procedures Procedures (including critical care time)  Medications Ordered in ED Medications  sodium chloride 0.9 % bolus 1,000 mL ( Intravenous Stopped 10/18/18 1717)  acetaminophen (TYLENOL) tablet 1,000 mg (1,000 mg Oral Given 10/18/18 1718)  HYDROmorphone (DILAUDID) injection 0.5 mg (0.5 mg Intravenous Given 10/18/18 1718)  ondansetron (ZOFRAN)  injection 4 mg (4 mg Intravenous Given 10/18/18 1718)  HYDROmorphone (DILAUDID) injection 0.5 mg (0.5 mg Intravenous Given 10/18/18 1744)     Initial Impression / Assessment and Plan / ED Course  I have reviewed the triage vital signs and the nursing notes.  Pertinent labs & imaging results that were available during  my care of the patient were reviewed by me and considered in my medical decision making (see chart for details).        68 yo F with a chief complaint of right lower quadrant abdominal pain.  Patient appears very uncomfortable on arrival.  She has a history of kidney stones before and had some described urinary symptoms in triage though denies them to me.  She is febrile to 103.5 here.  Will obtain laboratory evaluation CT stone study chest x-ray UA.  Patient CT scan shows that she has bilateral hydronephrosis.  This apparently was seen on a prior CT scan and it is somewhat worse than it was at the beginning of March.  She does have some right-sided hydronephrosis but no signs of stone.  Appendix was read as normal.  On my repeat exam the patient is feeling much better.  I wonder if the patient had a stone and passed it, I discussed that with fever and abdominal pain of unknown etiology it would not be unreasonable to put her into the hospital for observation.  The patient at this point would like to go home.  I discussed that her lactic acid was elevated and offered to repeat it though at this point she would prefer to go home regardless of the results.  She has been followed up for this hydronephrosis previously with Dr. Junious Silk and she will call him tomorrow.  She also call her family doctor and see when they want to repeat her renal function.  7:11 PM:  I have discussed the diagnosis/risks/treatment options with the patient and believe the pt to be eligible for discharge home to follow-up with PCP, urology. We also discussed returning to the ED immediately if new or worsening sx occur. We discussed the sx which are most concerning (e.g., sudden worsening pain, fever, inability to tolerate by mouth) that necessitate immediate return. Medications administered to the patient during their visit and any new prescriptions provided to the patient are listed below.  Medications given during this visit  Medications  sodium chloride 0.9 % bolus 1,000 mL ( Intravenous Stopped 10/18/18 1717)  acetaminophen (TYLENOL) tablet 1,000 mg (1,000 mg Oral Given 10/18/18 1718)  HYDROmorphone (DILAUDID) injection 0.5 mg (0.5 mg Intravenous Given 10/18/18 1718)  ondansetron (ZOFRAN) injection 4 mg (4 mg Intravenous Given 10/18/18 1718)  HYDROmorphone (DILAUDID) injection 0.5 mg (0.5 mg Intravenous Given 10/18/18 1744)     The patient appears reasonably screen and/or stabilized for discharge and I doubt any other medical condition or other [facility] requiring further screening, evaluation, or treatment in the ED at this time prior to discharge.    Final Clinical Impressions(s) / ED Diagnoses   Final diagnoses:  RLQ abdominal pain  Other hydronephrosis  AKI (acute kidney injury) [facility])    ED Discharge Orders         Ordered    morphine (MSIR) 15 MG tablet  Every 4 hours PRN     10/18/18 1908    ondansetron (ZOFRAN ODT) 4 MG disintegrating tablet     10/18/18 1908           Jamylah Marinaccio, DO 10/18/18 1910  Deno Etienne, DO 10/18/18 1911

## 2018-10-18 NOTE — ED Notes (Signed)
Patient transported to CT 

## 2018-10-18 NOTE — Discharge Instructions (Addendum)
Return for worsening pain, inability to eat or drink.  Follow up with your PCP and urologist.

## 2018-10-19 ENCOUNTER — Encounter: Payer: Self-pay | Admitting: Family Medicine

## 2018-10-19 ENCOUNTER — Ambulatory Visit (INDEPENDENT_AMBULATORY_CARE_PROVIDER_SITE_OTHER): Payer: Medicare Other | Admitting: Family Medicine

## 2018-10-19 DIAGNOSIS — N200 Calculus of kidney: Secondary | ICD-10-CM | POA: Diagnosis not present

## 2018-10-19 DIAGNOSIS — M797 Fibromyalgia: Secondary | ICD-10-CM | POA: Diagnosis not present

## 2018-10-19 DIAGNOSIS — R1031 Right lower quadrant pain: Secondary | ICD-10-CM | POA: Diagnosis not present

## 2018-10-19 DIAGNOSIS — R509 Fever, unspecified: Secondary | ICD-10-CM | POA: Diagnosis not present

## 2018-10-19 LAB — URINE CULTURE: Culture: NO GROWTH

## 2018-10-19 NOTE — Progress Notes (Signed)
Virtual Visit via Video Note  Subjective  CC:  Chief Complaint  Patient presents with  . Shortness of Breath    Symptoms started 4/26, comes and goes  . Fever    103.8 today, [facility]  . Headache  . Abdominal Pain     I connected with Miranda Mathis on 10/19/18 at  3:00 PM EDT by a video enabled telemedicine application and verified that I am speaking with the correct person using two identifiers. Location patient: Home Location provider: [facility] [place], Office Persons participating in the virtual visit: Miranda Mathis, Miranda Arnt, MD Miranda Mathis, [name] discussed the limitations of evaluation and management by telemedicine and the availability of in person appointments. The patient expressed understanding and agreed to proceed. HPI: Miranda Mathis is a 68 y.o. female who was contacted today to address the problems listed above in the chief complaint. . Complicated case: has h/o RLQ abdominal ongoing now for months and evaluated by me, GI and urology w/o clear cause, thought related IBS/constipation and fibromyalgia. Has long h/o recurrent nephrolithiasis. Now with fevers and pain. No respiratory sxs. I reveiewed ER evaluation from yesterday: nl WBC, elevated lactic acid, mildly abnl UA with rare bacteria present, neg CXR and renal stone CT showed mildly worsening bilateral hydronephrosis. No bowel abnormalities were noted. Also with mild AKI. She was released to home for monitoring. No treatments offered.  . Today with fever to 103 down to 100 after advil. Decreased appetite and mild nausea. No new sxs.   Assessment  1. RLQ abdominal pain   2. Fibromyalgia   3. Recurrent kidney stones   4. Fever, unspecified fever cause      Plan   Fever with pain and unclear source:  Possible related to renal stone or pyelo; urine culture is pending. Worrisome given high fevers. rec calling urology to evaluate the ua from yesterday and CT: low  threshold for starting abx. If worsens, rec hospital admission for further monitoring and clarification of etiology of pain and fever. Patient understands and agrees with care plan.   I discussed the assessment and treatment plan with the patient. The patient was provided an opportunity to ask questions and all were answered. The patient agreed with the plan and demonstrated an understanding of the instructions.   The patient was advised to call back or seek an in-person evaluation if the symptoms worsen or if the condition fails to improve as anticipated. Follow up:   10/27/2018  No orders of the defined types were placed in this encounter.     I reviewed the patients updated PMH, FH, and SocHx.    Patient Active Problem List   Diagnosis Date Noted  . DM type 2 with diabetic peripheral neuropathy ([place]) 09/02/2016    Priority: High  . Recurrent kidney stones 08/29/2015    Priority: High  . Barrett's esophagus with esophagitis 08/29/2015    Priority: High  . GAD (generalized anxiety disorder) 08/29/2015    Priority: High  . Acquired hypothyroidism 11/02/2013    Priority: High  . Mixed hyperlipidemia 04/04/2012    Priority: High  . Fibromyalgia 10/11/2007    Priority: High  . Chronic low back pain 10/11/2007    Priority: High  . Psoriasis 12/02/2017    Priority: Medium  . GERD (gastroesophageal reflux disease) 08/29/2015    Priority: Medium  . Irritable bowel syndrome with constipation 08/29/2015  Priority: Medium  . Spondylosis of lumbar region without myelopathy or radiculopathy 08/29/2015    Priority: Medium  . Primary insomnia 10/25/2014    Priority: Medium  . Seborrheic dermatitis of scalp 12/02/2017    Priority: Low  . Allergic rhinitis 10/11/2007    Priority: Low   Current Meds  Medication Sig  . acetaminophen (TYLENOL) 500 MG tablet Take 1,000 mg by mouth every 4 (four) hours as needed. pain   . amitriptyline (ELAVIL) 25 MG tablet TAKE 1 TABLET BY MOUTH  EVERYDAY AT BEDTIME  . cholecalciferol (VITAMIN D) 1000 units tablet Take 2,000 Units by mouth daily.  . clotrimazole-betamethasone (LOTRISONE) cream APPLY TO AFFECTED AREA TWICE A DAY  . escitalopram (LEXAPRO) 20 MG tablet TAKE 1 TABLET BY MOUTH EVERY DAY  . gabapentin (NEURONTIN) 300 MG capsule Take 1 capsule (300 mg total) by mouth 3 (three) times daily.  [name] ketorolac (TORADOL) 10 MG tablet Take 1 tablet (10 mg total) by mouth every 6 (six) hours as needed.  [name] levothyroxine (SYNTHROID, LEVOTHROID) 137 MCG tablet Take 1 tablet (137 mcg total) by mouth daily before breakfast.  . lisinopril (PRINIVIL,ZESTRIL) 5 MG tablet Take 1 tablet (5 mg total) by mouth daily.  . meloxicam (MOBIC) 15 MG tablet Take 1 tablet (15 mg total) by mouth daily.  [name] morphine (MSIR) 15 MG tablet Take 1 tablet (15 mg total) by mouth every 4 (four) hours as needed for severe pain.  [name] omeprazole (PRILOSEC) 40 MG capsule Take 1 capsule (40 mg total) by mouth daily.  . ondansetron (ZOFRAN ODT) 4 MG disintegrating tablet 4mg  ODT q4 hours prn nausea/vomit  . pravastatin (PRAVACHOL) 40 MG tablet TAKE 1 TABLET DAILY  . senna-docusate (SENOKOT-S) 8.6-50 MG tablet Take 1-2 tablets by mouth every evening.  . zolpidem (AMBIEN) 10 MG tablet TAKE 1 TABLET BY MOUTH EVERY DAY AT BEDTIME AS NEEDED FOR SLEEP    Allergies: Patient is allergic to morphine. Family History: Patient family history includes Arthritis in her brother, daughter, father, maternal grandmother, and mother; Asthma in her daughter; Birth defects in her daughter and father; COPD in her mother; Cancer in her father; Coronary artery disease in her paternal grandfather; Depression in her daughter; Diabetes in her paternal aunt; Emphysema in her mother; Hearing loss in her daughter; Hyperlipidemia in her brother; Hypertension in her father and mother; Kidney Stones in her daughter; Mental retardation in her daughter; Osteoarthritis in her mother; Prostate cancer in her  father; Rheum arthritis in her mother. Social History:  Patient  reports that she has never smoked. She has never used smokeless tobacco. She reports current alcohol use of about 1.0 standard drinks of alcohol per week. She reports that she does not use drugs.  Review of Systems: Constitutional: Negative for fever malaise or anorexia Cardiovascular: negative for chest pain Respiratory: negative for SOB or persistent cough Gastrointestinal: negative for abdominal pain  OBJECTIVE Vitals: There were no vitals taken for this visit. reports fever, non toxic appearing. No respiratory distress General: no acute distress , A&Ox3  No visits with results within 1 Day(s) from this visit.  Latest known visit with results is:  Admission on 10/18/2018, Discharged on 10/18/2018  Component Date Value Ref Range Status  . Sodium 10/18/2018 133* 135 - 145 mmol/L Final  . Potassium 10/18/2018 3.9  3.5 - 5.1 mmol/L Final  . Chloride 10/18/2018 101  98 - 111 mmol/L Final  . CO2 10/18/2018 21* 22 - 32 mmol/L Final  . Glucose, Bld 10/18/2018 178*  70 - 99 mg/dL Final  . BUN 10/18/2018 17  8 - 23 mg/dL Final  . Creatinine, Ser 10/18/2018 1.57* 0.44 - 1.00 mg/dL Final  . Calcium 10/18/2018 8.7* 8.9 - 10.3 mg/dL Final  . Total Protein 10/18/2018 7.3  6.5 - 8.1 g/dL Final  . Albumin 10/18/2018 3.6  3.5 - 5.0 g/dL Final  . AST 10/18/2018 26  15 - 41 U/L Final  . ALT 10/18/2018 26  0 - 44 U/L Final  . Alkaline Phosphatase 10/18/2018 77  38 - 126 U/L Final  . Total Bilirubin 10/18/2018 0.7  0.3 - 1.2 mg/dL Final  . GFR calc non Af Amer 10/18/2018 34* >60 mL/min Final  . GFR calc Af Amer 10/18/2018 39* >60 mL/min Final  . Anion gap 10/18/2018 11  5 - 15 Final  . WBC 10/18/2018 9.1  4.0 - 10.5 K/uL Final  . RBC 10/18/2018 4.27  3.87 - 5.11 MIL/uL Final  . Hemoglobin 10/18/2018 12.3  12.0 - 15.0 g/dL Final  . HCT 10/18/2018 38.2  36.0 - 46.0 % Final  . MCV 10/18/2018 89.5  80.0 - 100.0 fL Final  . MCH  10/18/2018 28.8  26.0 - 34.0 pg Final  . MCHC 10/18/2018 32.2  30.0 - 36.0 g/dL Final  . RDW 10/18/2018 14.0  11.5 - 15.5 % Final  . Platelets 10/18/2018 141* 150 - 400 K/uL Final  . nRBC 10/18/2018 0.0  0.0 - 0.2 % Final  . Neutrophils Relative % 10/18/2018 81  % Final  . Neutro Abs 10/18/2018 7.4  1.7 - 7.7 K/uL Final  . Lymphocytes Relative 10/18/2018 8  % Final  . Lymphs Abs 10/18/2018 0.7  0.7 - 4.0 K/uL Final  . Monocytes Relative 10/18/2018 10  % Final  . Monocytes Absolute 10/18/2018 0.9  0.1 - 1.0 K/uL Final  . Eosinophils Relative 10/18/2018 0  % Final  . Eosinophils Absolute 10/18/2018 0.0  0.0 - 0.5 K/uL Final  . Basophils Relative 10/18/2018 0  % Final  . Basophils Absolute 10/18/2018 0.0  0.0 - 0.1 K/uL Final  . Immature Granulocytes 10/18/2018 1  % Final  . Abs Immature Granulocytes 10/18/2018 0.05  0.00 - 0.07 K/uL Final  . Specimen Description 10/18/2018    Final                   Value:BLOOD LEFT ANTECUBITAL Performed at [facility], [place] [address] [place], [place] [address]   . Special Requests 10/18/2018    Final                   Value:BOTTLES DRAWN AEROBIC AND ANAEROBIC Blood Culture adequate volume Performed at [facility], [address] [place], [place] [address]   . Culture 10/18/2018    Final                   Value:NO GROWTH < 24 HOURS Performed at [facility], [place] [address] [place], [place] [address]   . Report Status 10/18/2018 PENDING   Incomplete  . Specimen Description 10/18/2018    Final                   Value:BLOOD LEFT HAND Performed at [facility], [address] [place],  [address]   . Special Requests 10/18/2018    Final                   Value:BOTTLES  DRAWN AEROBIC AND ANAEROBIC Blood Culture adequate volume Performed at [facility], [address] [place], [place] [address]   . Culture 10/18/2018    Final                   Value:NO GROWTH < 24 HOURS Performed at [facility], [place] [address] [place], [place] [address]   . Report Status 10/18/2018 PENDING   Incomplete  . Color, Urine 10/18/2018 YELLOW  YELLOW Final  . APPearance 10/18/2018 CLEAR  CLEAR Final  . Specific Gravity, Urine 10/18/2018 1.010  1.005 - 1.030 Final  . pH 10/18/2018 8.0  5.0 - 8.0 Final  . Glucose, UA 10/18/2018 NEGATIVE  NEGATIVE mg/dL Final  . Hgb urine dipstick 10/18/2018 SMALL* NEGATIVE Final  . Bilirubin Urine 10/18/2018 NEGATIVE  NEGATIVE Final  . Ketones, ur 10/18/2018 NEGATIVE  NEGATIVE mg/dL Final  . Protein, ur 10/18/2018 100* NEGATIVE mg/dL Final  . Nitrite 10/18/2018 NEGATIVE  NEGATIVE Final  . Chalmers Guest 10/18/2018 SMALL* NEGATIVE Final  . Lactic Acid, Venous 10/18/2018 3.2* 0.5 - 1.9 mmol/L Final  . RBC / HPF 10/18/2018 0-5  0 - 5 RBC/hpf Final  . WBC, UA 10/18/2018 6-10  0 - 5 WBC/hpf Final  . Bacteria, UA 10/18/2018 RARE* NONE SEEN Final  . Squamous Epithelial / LPF 10/18/2018 0-5  0 - 5 Final    Miranda Arnt, MD

## 2018-10-21 ENCOUNTER — Encounter (HOSPITAL_COMMUNITY): Payer: Self-pay | Admitting: Emergency Medicine

## 2018-10-21 ENCOUNTER — Inpatient Hospital Stay (HOSPITAL_COMMUNITY)
Admission: EM | Admit: 2018-10-21 | Discharge: 2018-10-23 | DRG: 660 | Disposition: A | Discharge status: Home / Self Care | Payer: Medicare Other | Attending: Family Medicine | Admitting: Family Medicine

## 2018-10-21 ENCOUNTER — Telehealth (HOSPITAL_BASED_OUTPATIENT_CLINIC_OR_DEPARTMENT_OTHER): Payer: Self-pay | Admitting: Emergency Medicine

## 2018-10-21 ENCOUNTER — Other Ambulatory Visit: Payer: Self-pay

## 2018-10-21 DIAGNOSIS — Z8261 Family history of arthritis: Secondary | ICD-10-CM

## 2018-10-21 DIAGNOSIS — E782 Mixed hyperlipidemia: Secondary | ICD-10-CM | POA: Diagnosis not present

## 2018-10-21 DIAGNOSIS — Z791 Long term (current) use of non-steroidal anti-inflammatories (NSAID): Secondary | ICD-10-CM

## 2018-10-21 DIAGNOSIS — M4150 Other secondary scoliosis, site unspecified: Secondary | ICD-10-CM | POA: Diagnosis present

## 2018-10-21 DIAGNOSIS — L210 Seborrhea capitis: Secondary | ICD-10-CM | POA: Diagnosis present

## 2018-10-21 DIAGNOSIS — E1122 Type 2 diabetes mellitus with diabetic chronic kidney disease: Secondary | ICD-10-CM | POA: Diagnosis not present

## 2018-10-21 DIAGNOSIS — N1330 Unspecified hydronephrosis: Secondary | ICD-10-CM

## 2018-10-21 DIAGNOSIS — Z810 Family history of intellectual disabilities: Secondary | ICD-10-CM

## 2018-10-21 DIAGNOSIS — M797 Fibromyalgia: Secondary | ICD-10-CM | POA: Diagnosis present

## 2018-10-21 DIAGNOSIS — Z6838 Body mass index (BMI) 38.0-38.9, adult: Secondary | ICD-10-CM

## 2018-10-21 DIAGNOSIS — B9620 Unspecified Escherichia coli [E. coli] as the cause of diseases classified elsewhere: Secondary | ICD-10-CM | POA: Diagnosis present

## 2018-10-21 DIAGNOSIS — R7881 Bacteremia: Secondary | ICD-10-CM | POA: Diagnosis not present

## 2018-10-21 DIAGNOSIS — Z833 Family history of diabetes mellitus: Secondary | ICD-10-CM

## 2018-10-21 DIAGNOSIS — Z8349 Family history of other endocrine, nutritional and metabolic diseases: Secondary | ICD-10-CM | POA: Diagnosis not present

## 2018-10-21 DIAGNOSIS — E039 Hypothyroidism, unspecified: Secondary | ICD-10-CM | POA: Diagnosis not present

## 2018-10-21 DIAGNOSIS — F5101 Primary insomnia: Secondary | ICD-10-CM | POA: Diagnosis present

## 2018-10-21 DIAGNOSIS — N189 Chronic kidney disease, unspecified: Secondary | ICD-10-CM | POA: Diagnosis not present

## 2018-10-21 DIAGNOSIS — K219 Gastro-esophageal reflux disease without esophagitis: Secondary | ICD-10-CM | POA: Diagnosis not present

## 2018-10-21 DIAGNOSIS — Z8042 Family history of malignant neoplasm of prostate: Secondary | ICD-10-CM

## 2018-10-21 DIAGNOSIS — Z20828 Contact with and (suspected) exposure to other viral communicable diseases: Secondary | ICD-10-CM | POA: Diagnosis not present

## 2018-10-21 DIAGNOSIS — L304 Erythema intertrigo: Secondary | ICD-10-CM | POA: Diagnosis present

## 2018-10-21 DIAGNOSIS — F411 Generalized anxiety disorder: Secondary | ICD-10-CM | POA: Diagnosis not present

## 2018-10-21 DIAGNOSIS — F329 Major depressive disorder, single episode, unspecified: Secondary | ICD-10-CM | POA: Diagnosis present

## 2018-10-21 DIAGNOSIS — L409 Psoriasis, unspecified: Secondary | ICD-10-CM | POA: Diagnosis present

## 2018-10-21 DIAGNOSIS — Z8249 Family history of ischemic heart disease and other diseases of the circulatory system: Secondary | ICD-10-CM

## 2018-10-21 DIAGNOSIS — K581 Irritable bowel syndrome with constipation: Secondary | ICD-10-CM | POA: Diagnosis present

## 2018-10-21 DIAGNOSIS — N131 Hydronephrosis with ureteral stricture, not elsewhere classified: Secondary | ICD-10-CM | POA: Diagnosis not present

## 2018-10-21 DIAGNOSIS — Z825 Family history of asthma and other chronic lower respiratory diseases: Secondary | ICD-10-CM

## 2018-10-21 DIAGNOSIS — Z79890 Hormone replacement therapy (postmenopausal): Secondary | ICD-10-CM | POA: Diagnosis not present

## 2018-10-21 DIAGNOSIS — N179 Acute kidney failure, unspecified: Principal | ICD-10-CM | POA: Diagnosis present

## 2018-10-21 DIAGNOSIS — M5136 Other intervertebral disc degeneration, lumbar region: Secondary | ICD-10-CM | POA: Diagnosis present

## 2018-10-21 DIAGNOSIS — Z87442 Personal history of urinary calculi: Secondary | ICD-10-CM

## 2018-10-21 DIAGNOSIS — R1031 Right lower quadrant pain: Secondary | ICD-10-CM | POA: Diagnosis not present

## 2018-10-21 DIAGNOSIS — Z818 Family history of other mental and behavioral disorders: Secondary | ICD-10-CM

## 2018-10-21 DIAGNOSIS — E1142 Type 2 diabetes mellitus with diabetic polyneuropathy: Secondary | ICD-10-CM | POA: Diagnosis present

## 2018-10-21 DIAGNOSIS — Z885 Allergy status to narcotic agent status: Secondary | ICD-10-CM

## 2018-10-21 DIAGNOSIS — Z9049 Acquired absence of other specified parts of digestive tract: Secondary | ICD-10-CM

## 2018-10-21 DIAGNOSIS — N3289 Other specified disorders of bladder: Secondary | ICD-10-CM | POA: Diagnosis present

## 2018-10-21 LAB — CBC WITH DIFFERENTIAL/PLATELET
Abs Immature Granulocytes: 0.02 10*3/uL (ref 0.00–0.07)
Basophils Absolute: 0 10*3/uL (ref 0.0–0.1)
Basophils Relative: 1 %
Eosinophils Absolute: 0.3 10*3/uL (ref 0.0–0.5)
Eosinophils Relative: 5 %
HCT: 37.3 % (ref 36.0–46.0)
Hemoglobin: 11.9 g/dL — ABNORMAL LOW (ref 12.0–15.0)
Immature Granulocytes: 0 %
Lymphocytes Relative: 17 %
Lymphs Abs: 1 10*3/uL (ref 0.7–4.0)
MCH: 28.5 pg (ref 26.0–34.0)
MCHC: 31.9 g/dL (ref 30.0–36.0)
MCV: 89.4 fL (ref 80.0–100.0)
Monocytes Absolute: 0.5 10*3/uL (ref 0.1–1.0)
Monocytes Relative: 8 %
Neutro Abs: 4.2 10*3/uL (ref 1.7–7.7)
Neutrophils Relative %: 69 %
Platelets: 209 10*3/uL (ref 150–400)
RBC: 4.17 MIL/uL (ref 3.87–5.11)
RDW: 14.2 % (ref 11.5–15.5)
WBC: 6.1 10*3/uL (ref 4.0–10.5)
nRBC: 0 % (ref 0.0–0.2)

## 2018-10-21 LAB — COMPREHENSIVE METABOLIC PANEL
ALT: 37 U/L (ref 0–44)
AST: 31 U/L (ref 15–41)
Albumin: 2.8 g/dL — ABNORMAL LOW (ref 3.5–5.0)
Alkaline Phosphatase: 80 U/L (ref 38–126)
Anion gap: 7 (ref 5–15)
BUN: 27 mg/dL — ABNORMAL HIGH (ref 8–23)
CO2: 26 mmol/L (ref 22–32)
Calcium: 8.7 mg/dL — ABNORMAL LOW (ref 8.9–10.3)
Chloride: 106 mmol/L (ref 98–111)
Creatinine, Ser: 1.62 mg/dL — ABNORMAL HIGH (ref 0.44–1.00)
GFR calc Af Amer: 37 mL/min — ABNORMAL LOW (ref >60)
GFR calc non Af Amer: 32 mL/min — ABNORMAL LOW (ref >60)
Glucose, Bld: 143 mg/dL — ABNORMAL HIGH (ref 70–99)
Potassium: 4.1 mmol/L (ref 3.5–5.1)
Sodium: 139 mmol/L (ref 135–145)
Total Bilirubin: 0.5 mg/dL (ref 0.3–1.2)
Total Protein: 6.3 g/dL — ABNORMAL LOW (ref 6.5–8.1)

## 2018-10-21 LAB — URINALYSIS, ROUTINE W REFLEX MICROSCOPIC
Bilirubin Urine: NEGATIVE
Bilirubin Urine: NEGATIVE
Glucose, UA: NEGATIVE mg/dL
Glucose, UA: NEGATIVE mg/dL
Hgb urine dipstick: NEGATIVE
Ketones, ur: NEGATIVE mg/dL
Ketones, ur: NEGATIVE mg/dL
Nitrite: NEGATIVE
Nitrite: NEGATIVE
Protein, ur: NEGATIVE mg/dL
Protein, ur: NEGATIVE mg/dL
RBC / HPF: >50 RBC/hpf — ABNORMAL HIGH (ref 0–5)
Specific Gravity, Urine: 1.017 (ref 1.005–1.030)
Specific Gravity, Urine: 1.014 (ref 1.005–1.030)
WBC, UA: >50 WBC/hpf — ABNORMAL HIGH (ref 0–5)
pH: 5 (ref 5.0–8.0)
pH: 5 (ref 5.0–8.0)

## 2018-10-21 LAB — BLOOD CULTURE ID PANEL (REFLEXED)

## 2018-10-21 LAB — SODIUM, URINE, RANDOM: Sodium, Ur: 54 mmol/L

## 2018-10-21 LAB — GLUCOSE, CAPILLARY
Glucose-Capillary: 105 mg/dL — ABNORMAL HIGH (ref 70–99)
Glucose-Capillary: 96 mg/dL (ref 70–99)

## 2018-10-21 LAB — CREATININE, URINE, RANDOM: Creatinine, Urine: 82.23 mg/dL

## 2018-10-21 LAB — HEMOGLOBIN A1C
Hgb A1c MFr Bld: 7 % — ABNORMAL HIGH (ref 4.8–5.6)
Mean Plasma Glucose: 154.2 mg/dL

## 2018-10-21 LAB — PROTIME-INR
INR: 1.1 (ref 0.8–1.2)
Prothrombin Time: 14.2 seconds (ref 11.4–15.2)

## 2018-10-21 MED ORDER — ZOLPIDEM TARTRATE 5 MG PO TABS
5.0000 mg | ORAL_TABLET | Freq: Every evening | ORAL | Status: DC | PRN
  Filled 2018-10-21 (×2): qty 1

## 2018-10-21 MED ORDER — SODIUM CHLORIDE 0.9 % IV SOLN
INTRAVENOUS | Status: DC
  Administered 2018-10-21 – 2018-10-22 (×3): via INTRAVENOUS

## 2018-10-21 MED ORDER — PRAVASTATIN SODIUM 40 MG PO TABS
40.0000 mg | ORAL_TABLET | Freq: Every day | ORAL | Status: DC
  Administered 2018-10-21 – 2018-10-22 (×2): 40 mg via ORAL
  Filled 2018-10-21 (×2): qty 1

## 2018-10-21 MED ORDER — INSULIN ASPART 100 UNIT/ML ~~LOC~~ SOLN
0.0000 [IU] | Freq: Three times a day (TID) | SUBCUTANEOUS | Status: DC
  Administered 2018-10-23: 1 [IU] via SUBCUTANEOUS

## 2018-10-21 MED ORDER — PANTOPRAZOLE SODIUM 40 MG PO TBEC
40.0000 mg | DELAYED_RELEASE_TABLET | Freq: Every day | ORAL | Status: DC
  Administered 2018-10-21 – 2018-10-23 (×3): 40 mg via ORAL
  Filled 2018-10-21 (×3): qty 1

## 2018-10-21 MED ORDER — ACETAMINOPHEN 500 MG PO TABS
1000.0000 mg | ORAL_TABLET | Freq: Four times a day (QID) | ORAL | Status: DC | PRN
  Administered 2018-10-21: 1000 mg via ORAL
  Filled 2018-10-21: qty 2

## 2018-10-21 MED ORDER — SODIUM CHLORIDE 0.9 % IV SOLN
2.0000 g | INTRAVENOUS | Status: DC
  Administered 2018-10-21 – 2018-10-22 (×2): 2 g via INTRAVENOUS
  Filled 2018-10-21 (×3): qty 20

## 2018-10-21 MED ORDER — ENOXAPARIN SODIUM 40 MG/0.4ML ~~LOC~~ SOLN
40.0000 mg | SUBCUTANEOUS | Status: DC
  Administered 2018-10-21: 40 mg via SUBCUTANEOUS
  Filled 2018-10-21: qty 0.4

## 2018-10-21 MED ORDER — TRAMADOL HCL 50 MG PO TABS
50.0000 mg | ORAL_TABLET | Freq: Four times a day (QID) | ORAL | Status: DC | PRN
  Administered 2018-10-21 – 2018-10-22 (×2): 50 mg via ORAL
  Filled 2018-10-21 (×2): qty 1

## 2018-10-21 MED ORDER — FENTANYL CITRATE (PF) 100 MCG/2ML IJ SOLN
100.0000 ug | Freq: Once | INTRAMUSCULAR | Status: AC
  Administered 2018-10-21: 100 ug via INTRAVENOUS
  Filled 2018-10-21: qty 2

## 2018-10-21 MED ORDER — CLOTRIMAZOLE 1 % EX CREA
TOPICAL_CREAM | Freq: Two times a day (BID) | CUTANEOUS | Status: DC
  Administered 2018-10-21 – 2018-10-23 (×4): via TOPICAL
  Filled 2018-10-21: qty 15

## 2018-10-21 MED ORDER — LEVOTHYROXINE SODIUM 25 MCG PO TABS
137.0000 ug | ORAL_TABLET | Freq: Every day | ORAL | Status: DC
  Administered 2018-10-23: 137 ug via ORAL
  Filled 2018-10-21 (×2): qty 1

## 2018-10-21 MED ORDER — ESCITALOPRAM OXALATE 20 MG PO TABS
20.0000 mg | ORAL_TABLET | Freq: Every day | ORAL | Status: DC
  Administered 2018-10-22 – 2018-10-23 (×2): 20 mg via ORAL
  Filled 2018-10-21 (×3): qty 1

## 2018-10-21 MED ORDER — ONDANSETRON 4 MG PO TBDP: 4.0000 mg | ORAL_TABLET | ORAL | Status: DC | PRN

## 2018-10-21 MED ORDER — AMITRIPTYLINE HCL 25 MG PO TABS
25.0000 mg | ORAL_TABLET | Freq: Every day | ORAL | Status: DC
  Administered 2018-10-21 – 2018-10-22 (×2): 25 mg via ORAL
  Filled 2018-10-21 (×2): qty 1

## 2018-10-21 MED ORDER — VITAMIN D 25 MCG (1000 UNIT) PO TABS
2000.0000 [IU] | ORAL_TABLET | Freq: Every day | ORAL | Status: DC
  Administered 2018-10-21 – 2018-10-23 (×3): 2000 [IU] via ORAL
  Filled 2018-10-21 (×3): qty 2

## 2018-10-21 MED ORDER — SODIUM CHLORIDE 0.9 % IV BOLUS
1000.0000 mL | Freq: Once | INTRAVENOUS | Status: AC
  Administered 2018-10-21: 1000 mL via INTRAVENOUS

## 2018-10-21 MED ORDER — ENOXAPARIN SODIUM 30 MG/0.3ML ~~LOC~~ SOLN: 30.0000 mg | SUBCUTANEOUS | Status: DC

## 2018-10-21 NOTE — ED Notes (Signed)
Nurse Navigator Note  The pt is resting, she has been in contact with her husband via text, she does not have any further questions at this time Comfort measures. Updated on admission process.

## 2018-10-21 NOTE — ED Notes (Signed)
admit Provider at bedside. 

## 2018-10-21 NOTE — H&P (Addendum)
[facility] Admission History and Physical Service Pager: [phone]  Patient name: Miranda Mathis Medical record number: [id number] Date of birth: [birth year] Age: 68 y.o. Gender: female  Primary Care Provider: Leamon Arnt, MD Consultants: Urology Code Status: Full  Chief Complaint: Bacteremia, RLQ pain  Assessment and Plan: Miranda Mathis is a 68 y.o. female presenting with bacteremia positive for E.coli from 10/18/18. PMH is significant for nephrolithiasis with chronic hydronephrosis, fibromyalgia, HLD, HTN, hypothyroidism, T2DM, GAD, GERD, IBS with constipation, Psoriasis.  E.Coli Bacteremia:  Patient seen in ED on 4/28 for RLQ and flank pain with fever of 103.2. Work up included negative urine culture, UA significant for small leuks and rare bacteria, elevated lactic acid of 3.2, CT with bilateral hydronephrosis without presence of stones, and blood culture positive for E.coli.  Although urine culture from 4/28 showed no growth, etiology most likely urologic in origin given history of chronic nephrolithiasis and UA significant for large leuks, >50 WBC, and rare bacteria. Patient also endorsed sharp stabbing RLQ and flank pain similar to her past kidney stones and increased frequency and nocturia.  Pyelonephritis is on the differential given symptoms, fever, and blood culture however no leukocytosis is reassuring. Of note, patient has long history of recurrent nephrolithiasis requiring stents and chronic left hydronephrosis. She did endorse intermittent nausea and vomiting, which is likely due to her flank and abdominal pain. Unlikely pulmonary or dermatologic given lack of symptoms and unremarkable physical exam. No recent surgeries and no sick contacts. In the ED, patient was hemodynamically stable on RA and afebrile. She received IV fentanyl x 1 and was started on IV Rocephin per ID pharmacy. - Admit to Med-surg, FPTS 1, attending Dr. Mingo Amber - 1L NS fluid bolus  followed by 179mL/hr NS IV fluids - Continue Ceftriaxone 2g IV - s/p Fentanyl x 1 in ED - Tylenol/Tramadol for pain - vitals per protocol - Urology consulted, recommend NPO, IV antibiotics and plan to see patient - AM CBC, BMP - Follow up Urine Na and Cr to calculate FENa - NPO, ice chips ok, can change pending urology evaluation and plan - vitals per floor - strict I/O's - urine culture recollected on 5/1 unfortunately after CTX given, follow up culture results - follow up blood culture recollected on 5/1  AKI: Cr: 1.57>1.67 (baseline 0.8-0.9). BUN 27.  Reports plenty of fluid intake (although endorses primarily drinking sodas/juice), but she has taken Toradol 3-4 times daily for the past week.  Before this week, she was taking Mobic once daily chronically.  Most likely d/t excessive Toradol intake with possible dehydration as a component.  Post-renal etiology is also possible given her acute on chronic bilateral hydronephrosis, although an obstruction was not visualized on CT.  Will further evaluate source of AKI and hydrate. - AM BMP - Follow up urine Na, urine Cr - IV fluids - avoid nephrotoxic medications  T2DM: Hgb A1C (06/02/18) 6.8. Currently diet controlled. - Follow up A1C - sSSI - CBG's TID AC  Hypothyroidism: Last TSH 3.64 (08/27/17). Home meds: Synthroid 16mcg QD.  - continue home meds  Fibromyalgia  Chronic low back pain  Peripheral Neuropathy: Noted to have long-standing degenerative scoliosis along with degenerative disc disease. Also h/o intermittent peripheral neuropathy in LE. Home Meds: OTC Tylenol, Gabapentin 300mg  TID, Elavil 25mg  for sleep, Toradol 10mg /Mobic 15mg  PRN, and Morphine PRN. Currently weaning Lorrin Mais, but continues to take it most nights.   - Continue Tylenol 1g q4 PRN, Elavil 25 qHS - [place]  Gabapentin, Toradol, and Mobic given AKI - Begin Tramadol 50mg  q6 PRN - If patient desires, can consider restarting home Gabapentin but renally  adjust  HTN: Home meds: Lisinopril 5mg  QD. BP on admission 142/65.  - [place] home Lisinopril given AKI - monitor BP's, restart when improved if BP tolerates and AKI resolves  GAD:  Home meds: Lexapro 20mg  QD - continue home meds  GERD: Home meds: Omeprazole 40mg  QD - Continue Protonix 40 QD (home med not on formulary)  HLD:  Last lipid panel 06/02/18: Chol 152, HDL 36.2, LDL 97, Trig 95. Home meds: Pravastatin 40mg  QD - Continue Pravastatin 40mg  QD  Insomnia: Home meds: Ambien 5mg  QD and Elavil 25mg .  She takes amitriptyline in the afternoon and will take Ambien if she cannot sleep after taking amitriptyline. - Continue home Ambien and Elavil   Psoriasis  Intertrigo Chronic yeast infection of the skin along inflammatory and pannus area. Home meds: Lotrisone cream and Betamethasone. - continue Lotrimin (home med not on formulary) for intertrigo - can restart steroid cream if desires for psoriasis  FEN/GI: Regular diet Prophylaxis: Lovenox  Disposition: Medsurg, Home pending improvement  History of Present Illness:  Miranda Mathis is a 68 y.o. female presenting with E coli bacteremia.  She says that her symptoms started on Sunday, April 26 with RLQ pain.  She also had nausea, vomiting, and confusion.  She also reports temperatures of 101F on Sunday through Tuesday.  She went to [facility] on April 28, where blood and urine cultures were obtained.  They also obtained a CT scan, which did not show nephrolithiasis but did show bilateral hydronephrosis that appeared worse than her baseline.  She says the pain is sharp and stabbing and feels similar to her kidney stone pain.  She says that she has had ureteral stent in her R ureter due to narrowing in this ureter.  Her urologist is Dr. Junious Silk, and she was supposed to see him today, however she was informed of blood culture results and instructed to come to the ED. There are no exacerbating factors to her pain that comes and goes. She  denies dysuria but says that she has increased urinary frequency.  She describes her urine as unusually dark this week despite drinking plenty of lemonade, Dr. Malachi Bonds, and Fruit Punch this week.  She reports reduced food intake due to feeling poorly and having intermittent nausea and vomiting.  She denies diarrhea but does report chronic constipation that is related to her IBS-C.  Her last bowel movement was a couple days ago. Denies any sick contacts, rashes, sore throat, ear pain, SOB, cough.   She was given Toradol for her pain from her primary doctor, her urologist, and her orthopedist about one month ago, and she was given morphine from [facility] when the Toradol was not helpful for her pain.    On 4/28 patient was seen at [facility] for RLQ pain and fever of 103.2. Her work-up included a lactic acid of 3.2, urinalysis with rare bacteria and small leukocytes, WBC of 9.1, creatinine of 1.57, and BUN of 17.  Urine culture negative, blood cultures significant for E. coli.  CT with bilateral hydronephrosis (although left hydronephrosis is chronic according to past CT scans) and no stones unified.  Patient was referred to urology for further evaluation.  In the ED patient was hemodynamically stable and afebrile. Patient received IV Fentanyl for pain with improvement. Repeat blood cultures and urine culture collected. Urinalysis significant for large leuks, >  50 WBC, and rare bacteria. CMP significant for BUN 27, Cr 1.62. CBC with normal WBC of 6.1.  She was admitted for further evaluation and IV antibiotics.  Review Of Systems: Per HPI with the following additions:   Review of Systems  Constitutional: Positive for chills and fever.  HENT: Negative for congestion, ear pain and sore throat.   Eyes: Negative for blurred vision, double vision and pain.  Respiratory: Negative for cough and shortness of breath.   Cardiovascular: Negative for chest pain.  Gastrointestinal: Positive for constipation,  nausea and vomiting. Negative for diarrhea.  Genitourinary: Positive for flank pain and frequency. Negative for dysuria, hematuria and urgency.  Skin:       No new rashes or skin lesions    Patient Active Problem List   Diagnosis Date Noted  . E coli bacteremia 10/21/2018  . Seborrheic dermatitis of scalp 12/02/2017  . Psoriasis 12/02/2017  . DM type 2 with diabetic peripheral neuropathy ([place]) 09/02/2016  . Recurrent kidney stones 08/29/2015  . Barrett's esophagus with esophagitis 08/29/2015  . GAD (generalized anxiety disorder) 08/29/2015  . GERD (gastroesophageal reflux disease) 08/29/2015  . Irritable bowel syndrome with constipation 08/29/2015  . Spondylosis of lumbar region without myelopathy or radiculopathy 08/29/2015  . Primary insomnia 10/25/2014  . Acquired hypothyroidism 11/02/2013  . Mixed hyperlipidemia 04/04/2012  . Allergic rhinitis 10/11/2007  . Fibromyalgia 10/11/2007  . Chronic low back pain 10/11/2007    Past Medical History: Past Medical History:  Diagnosis Date  . Allergic rhinitis, cause unspecified   . Barrett's esophagus   . Chicken pox   . Chronic back pain   . Chronic kidney disease    Stones  . Colon polyps   . DDD (degenerative disc disease), lumbar   . Depression   . Fibromyalgia   . GERD (gastroesophageal reflux disease)   . Headache(784.0)   . History of UTI   . Hyperlipidemia   . Hypothyroid 11/02/2013 dx  . IBS (irritable bowel syndrome)   . Stone, bladder   . Ureteral stricture, right     Past Surgical History: Past Surgical History:  Procedure Laterality Date  . ABDOMINAL HYSTERECTOMY  1987  . BALLOON DILATION  07/10/2011   Procedure: BALLOON DILATION;  Surgeon: Fredricka Bonine, MD;  Location: [facility];  Service: Urology;  Laterality: Right;  . CESAREAN SECTION  X2  . CYSTO/ BILATERAL URETEROSCOPY / URETERAL BX'S/ BILATERAL URETERAL STENT PLACEMENT/ BLADDER STONE EXTRACTION  03-13-2011  . CYSTOSCOPY W/  RETROGRADES  07/10/2011   Procedure: CYSTOSCOPY WITH RETROGRADE PYELOGRAM;  Surgeon: Fredricka Bonine, MD;  Location[facility];  Service: Urology;  Laterality: Right;  . CYSTOSCOPY W/ URETERAL STENT PLACEMENT  07/10/2011   Procedure: CYSTOSCOPY WITH STENT REPLACEMENT;  Surgeon: Fredricka Bonine, MD;  Location: [facility];  Service: Urology;  Laterality: Right;  . CYSTOSCOPY W/ URETERAL STENT REMOVAL  07/10/2011   Procedure: CYSTOSCOPY WITH STENT REMOVAL;  Surgeon: Fredricka Bonine, MD;  Location: [facility];  Service: Urology;  Laterality: Right;  . GALLBLADDER SURGERY    . LUMBAR MICRODISCECTOMY  1990'S   L5 - S1  . RIGHT FOOT SURG  2003   HEEL  . RIGHT URETEROSCOPIC / URETERAL BX/ STENT PLACEMENT  04-17-2011    Social History: Social History   Tobacco Use  . Smoking status: Never Smoker  . Smokeless tobacco: Never Used  Substance Use Topics  . Alcohol use: Yes    Alcohol/week: 1.0  standard drinks    Types: 1 Standard drinks or equivalent per week    Comment: rarely  . Drug use: No   Additional social history:  Please also refer to relevant sections of EMR.  Family History: Family History  Problem Relation Age of Onset  . COPD Mother   . Emphysema Mother   . Osteoarthritis Mother   . Rheum arthritis Mother   . Arthritis Mother   . Hypertension Mother   . Prostate cancer Father        w/mets  . Arthritis Father   . Birth defects Father   . Cancer Father   . Hypertension Father   . Arthritis Daughter   . Asthma Daughter   . Birth defects Daughter   . Depression Daughter   . Hearing loss Daughter   . Mental retardation Daughter   . Kidney Stones Daughter   . Arthritis Brother   . Hyperlipidemia Brother   . Coronary artery disease Paternal Grandfather   . Diabetes Paternal Aunt        x 3  . Arthritis Maternal Grandmother   . Colon cancer Neg Hx     Allergies and Medications: Allergies   Allergen Reactions  . Morphine Itching    REACTION: Severe itching   No current facility-administered medications on file prior to encounter.    Current Outpatient Medications on File Prior to Encounter  Medication Sig Dispense Refill  . acetaminophen (TYLENOL) 500 MG tablet Take 1,000 mg by mouth every 4 (four) hours as needed. pain     . amitriptyline (ELAVIL) 25 MG tablet TAKE 1 TABLET BY MOUTH EVERYDAY AT BEDTIME (Patient taking differently: Take 25 mg by mouth at bedtime. ) 90 tablet 3  . betamethasone dipropionate 0.05 % lotion Apply 1 application topically daily as needed. 60 mL 2  . cholecalciferol (VITAMIN D) 1000 units tablet Take 2,000 Units by mouth daily.    . clotrimazole-betamethasone (LOTRISONE) cream Apply 1 application topically 2 (two) times daily.   2  . escitalopram (LEXAPRO) 20 MG tablet TAKE 1 TABLET BY MOUTH EVERY DAY (Patient taking differently: Take 20 mg by mouth daily. ) 90 tablet 1  . gabapentin (NEURONTIN) 300 MG capsule Take 1 capsule (300 mg total) by mouth 3 (three) times daily. 270 capsule 1  . ketoconazole (NIZORAL) 2 % shampoo Apply 1 application topically 2 (two) times a week.    [name] ketorolac (TORADOL) 10 MG tablet Take 1 tablet (10 mg total) by mouth every 6 (six) hours as needed. 30 tablet 0  . levothyroxine (SYNTHROID, LEVOTHROID) 137 MCG tablet Take 1 tablet (137 mcg total) by mouth daily before breakfast. 90 tablet 3  . lisinopril (PRINIVIL,ZESTRIL) 5 MG tablet Take 1 tablet (5 mg total) by mouth daily. 90 tablet 3  . meloxicam (MOBIC) 15 MG tablet Take 1 tablet (15 mg total) by mouth daily. 90 tablet 3  . morphine (MSIR) 15 MG tablet Take 1 tablet (15 mg total) by mouth every 4 (four) hours as needed for severe pain. 7 tablet 0  . omeprazole (PRILOSEC) 40 MG capsule Take 1 capsule (40 mg total) by mouth daily. 90 capsule 1  . ondansetron (ZOFRAN ODT) 4 MG disintegrating tablet 4mg  ODT q4 hours prn nausea/vomit (Patient taking differently: Take 4 mg by  mouth every 4 (four) hours as needed for nausea or vomiting. 4mg  ODT q4 hours prn nausea/vomit) 20 tablet 0  . pravastatin (PRAVACHOL) 40 MG tablet Take 40 mg by mouth daily.     [name]  senna-docusate (SENOKOT-S) 8.6-50 MG tablet Take 1-2 tablets by mouth every evening.    . zolpidem (AMBIEN) 10 MG tablet TAKE 1 TABLET BY MOUTH EVERY DAY AT BEDTIME AS NEEDED FOR SLEEP (Patient taking differently: Take 10 mg by mouth at bedtime as needed for sleep. TAKE 1 TABLET BY MOUTH EVERY DAY AT BEDTIME AS NEEDED FOR SLEEP) 30 tablet 5    Objective: BP 124/74 (BP Location: Left Arm)   Pulse 67   Temp 98.7 F (37.1 C) (Oral)   Resp 18   Ht 5\' 2"  (1.575 m)   Wt 95.3 kg   SpO2 100%   BMI 38.41 kg/m  Exam: General: Older Caucasian female, well nourished, well developed, in no acute distress with non-toxic appearance, lying comfortably in hospital bed HEENT: normocephalic, atraumatic, dry mucous membranes, oropharynx without erythema or exudates, tympanic membranes WNL bilaterally Neck: supple, normal ROM CV: regular rate and rhythm without murmurs, rubs, or gallops, no lower extremity edema, 2+ radial and pedal pulses bilaterally Lungs: clear to auscultation bilaterally with normal work of breathing on room air Abdomen: soft, along right lower quadrant to deep palpation, non-distended, no masses or organomegaly palpable, normoactive bowel sounds Skin: warm, dry, no rashes, intertrigo along left inframammary area and under pannus bilaterally, small patch resembling psoriasis along left lower back, no signs of infection along entire skin exam Extremities: warm and well perfused, normal tone Neuro: Alert and oriented, speech normal  Labs and Imaging: CBC BMET  Recent Labs  Lab 10/21/18 1120  WBC 6.1  HGB 11.9*  HCT 37.3  PLT 209   Recent Labs  Lab 10/21/18 1120  NA 139  K 4.1  CL 106  CO2 26  BUN 27*  CREATININE 1.62*  GLUCOSE 143*  CALCIUM 8.7*      Dg Chest 2 View  Result Date:  10/18/2018 CLINICAL DATA:  Sepsis EXAM: CHEST - 2 VIEW COMPARISON:  11/03/2015 FINDINGS: The heart size and mediastinal contours are within normal limits. Both lungs are clear. The visualized skeletal structures are unremarkable. IMPRESSION: No active cardiopulmonary disease. Electronically Signed   By: Franchot Gallo M.D.   On: 10/18/2018 18:32   Ct Renal Stone Study  Result Date: 10/18/2018 CLINICAL DATA:  Right flank pain.  Nausea vomiting fever EXAM: CT ABDOMEN AND PELVIS WITHOUT CONTRAST TECHNIQUE: Multidetector CT imaging of the abdomen and pelvis was performed following the standard protocol without IV contrast. COMPARISON:  CT abdomen pelvis 08/23/2018 FINDINGS: Lower chest: Lung bases clear bilaterally. Hepatobiliary: Postop cholecystectomy without biliary dilatation. Fatty liver with enlargement of the left lobe of the liver unchanged. No focal liver lesion. Pancreas: Negative Spleen: Negative Adrenals/Urinary Tract: Mild right hydronephrosis. Proximal right ureter dilated. No obstructing stone. Punctate right upper pole stone seen on the prior study no longer present apparently has passed in the interval. Moderate left hydronephrosis and moderate dilatation of the left ureter with transition at the iliac crossing. No stone or mass identified. Left hydroureter appears to have progressed since the prior CT. Stomach/Bowel: Negative for bowel obstruction. Negative for bowel mass or edema. Normal appendix. Vascular/Lymphatic: Negative Reproductive: Hysterectomy.  No pelvic mass Other: No free fluid Musculoskeletal: Disc degeneration and spurring L4-5 and L5-S1. No acute skeletal abnormality. IMPRESSION: 1. Moderate left hydronephrosis with mild progression since 08/23/2018. Transition at the iliac crossing. No stone identified. Urologic evaluation recommended to rule out stricture or mass. 2. Mild right hydronephrosis. No obstructing stone seen today. Previously noted punctate stone right upper pole has  passed since the  prior CT. 3. Hepatomegaly with fatty infiltration. Electronically Signed   By: Franchot Gallo M.D.   On: 10/18/2018 18:39    Danna Hefty, DO 10/21/2018, 5:41 PM PGY-1, [place] Intern pager: [phone], text pages welcome  FPTS Upper-Level Resident Addendum   I have independently interviewed and examined the patient. I have discussed the above with the original author and agree with their documentation. My edits for correction/addition/clarification are in blue. Please see also any attending notes.    Kathrene Alu, MD PGY-2, [facility] 10/21/2018 6:35 PM  [facility] Service pager: [phone] (text pages welcome through [place])

## 2018-10-21 NOTE — ED Provider Notes (Signed)
[facility] EMERGENCY DEPARTMENT Provider Note   CSN: [id number] Arrival date & time: 10/21/18  1013    History   Chief Complaint Chief Complaint  Patient presents with  . Abnormal Lab    HPI Miranda Mathis is a 68 y.o. female.     HPI 68 year old female presents the emergency department after a positive blood culture was obtained on October 18, 2018.  She was seen in the emergency department at that time for some right lower quadrant abdominal pain was found to have bilateral hydronephrosis which was thought to be somewhat chronic in nature.  Her urine culture was negative.  She was discharged home to follow-up with urology as an outpatient.  She continues having some mild right lower quadrant abdominal pain at this time.  She was found to have blood cultures positive for entero-bacter and E. coli.  Rocephin is currently recommended by the ID pharmacist.  Patient reports ongoing fevers over the past 4 to 5 days. Past Medical History:  Diagnosis Date  . Allergic rhinitis, cause unspecified   . Barrett's esophagus   . Chicken pox   . Chronic back pain   . Chronic kidney disease    Stones  . Colon polyps   . DDD (degenerative disc disease), lumbar   . Depression   . Fibromyalgia   . GERD (gastroesophageal reflux disease)   . Headache(784.0)   . History of UTI   . Hyperlipidemia   . Hypothyroid 11/02/2013 dx  . IBS (irritable bowel syndrome)   . Stone, bladder   . Ureteral stricture, right     Patient Active Problem List   Diagnosis Date Noted  . Seborrheic dermatitis of scalp 12/02/2017  . Psoriasis 12/02/2017  . DM type 2 with diabetic peripheral neuropathy ([place]) 09/02/2016  . Recurrent kidney stones 08/29/2015  . Barrett's esophagus with esophagitis 08/29/2015  . GAD (generalized anxiety disorder) 08/29/2015  . GERD (gastroesophageal reflux disease) 08/29/2015  . Irritable bowel syndrome with constipation 08/29/2015  . Spondylosis of lumbar region  without myelopathy or radiculopathy 08/29/2015  . Primary insomnia 10/25/2014  . Acquired hypothyroidism 11/02/2013  . Mixed hyperlipidemia 04/04/2012  . Allergic rhinitis 10/11/2007  . Fibromyalgia 10/11/2007  . Chronic low back pain 10/11/2007    Past Surgical History:  Procedure Laterality Date  . ABDOMINAL HYSTERECTOMY  1987  . BALLOON DILATION  07/10/2011   Procedure: BALLOON DILATION;  Surgeon: Fredricka Bonine, MD;  Location: [facility];  Service: Urology;  Laterality: Right;  . CESAREAN SECTION  X2  . CYSTO/ BILATERAL URETEROSCOPY / URETERAL BX'S/ BILATERAL URETERAL STENT PLACEMENT/ BLADDER STONE EXTRACTION  03-13-2011  . CYSTOSCOPY W/ RETROGRADES  07/10/2011   Procedure: CYSTOSCOPY WITH RETROGRADE PYELOGRAM;  Surgeon: Fredricka Bonine, MD;  Location: [facility];  Service: Urology;  Laterality: Right;  . CYSTOSCOPY W/ URETERAL STENT PLACEMENT  07/10/2011   Procedure: CYSTOSCOPY WITH STENT REPLACEMENT;  Surgeon: Fredricka Bonine, MD;  Location: [facility];  Service: Urology;  Laterality: Right;  . CYSTOSCOPY W/ URETERAL STENT REMOVAL  07/10/2011   Procedure: CYSTOSCOPY WITH STENT REMOVAL;  Surgeon: Fredricka Bonine, MD;  Location: [facility];  Service: Urology;  Laterality: Right;  . GALLBLADDER SURGERY    . LUMBAR MICRODISCECTOMY  1990'S   L5 - S1  . RIGHT FOOT SURG  2003   HEEL  . RIGHT URETEROSCOPIC / URETERAL BX/ STENT PLACEMENT  04-17-2011     OB History  No obstetric history on file.      Home Medications    Prior to Admission medications   Medication Sig Start Date End Date Taking? Authorizing Provider  acetaminophen (TYLENOL) 500 MG tablet Take 1,000 mg by mouth every 4 (four) hours as needed. pain    Yes [provider]  amitriptyline (ELAVIL) 25 MG tablet TAKE 1 TABLET BY MOUTH EVERYDAY AT BEDTIME Patient taking differently: Take 25 mg by mouth at bedtime.   10/08/18  Yes Leamon Arnt, MD  betamethasone dipropionate 0.05 % lotion Apply 1 application topically daily as needed. 12/02/17  Yes Leamon Arnt, MD  cholecalciferol (VITAMIN D) 1000 units tablet Take 2,000 Units by mouth daily.   Yes [provider]  clotrimazole-betamethasone (LOTRISONE) cream Apply 1 application topically 2 (two) times daily.  11/10/17  Yes [provider]  escitalopram (LEXAPRO) 20 MG tablet TAKE 1 TABLET BY MOUTH EVERY DAY Patient taking differently: Take 20 mg by mouth daily.  06/28/18  Yes Leamon Arnt, MD  gabapentin (NEURONTIN) 300 MG capsule Take 1 capsule (300 mg total) by mouth 3 (three) times daily. 09/16/18  Yes Leamon Arnt, MD  ketoconazole (NIZORAL) 2 % shampoo Apply 1 application topically 2 (two) times a week. 05/29/16  Yes [provider]  ketorolac (TORADOL) 10 MG tablet Take 1 tablet (10 mg total) by mouth every 6 (six) hours as needed. 06/13/18  Yes Leamon Arnt, MD  levothyroxine (SYNTHROID, LEVOTHROID) 137 MCG tablet Take 1 tablet (137 mcg total) by mouth daily before breakfast. 12/02/17  Yes Leamon Arnt, MD  lisinopril (PRINIVIL,ZESTRIL) 5 MG tablet Take 1 tablet (5 mg total) by mouth daily. 06/03/18  Yes Leamon Arnt, MD  meloxicam (MOBIC) 15 MG tablet Take 1 tablet (15 mg total) by mouth daily. 12/02/17  Yes Leamon Arnt, MD  morphine (MSIR) 15 MG tablet Take 1 tablet (15 mg total) by mouth every 4 (four) hours as needed for severe pain. 10/18/18  Yes Deno Etienne, DO  omeprazole (PRILOSEC) 40 MG capsule Take 1 capsule (40 mg total) by mouth daily. 09/22/18  Yes Leamon Arnt, MD  ondansetron (ZOFRAN ODT) 4 MG disintegrating tablet 4mg  ODT q4 hours prn nausea/vomit Patient taking differently: Take 4 mg by mouth every 4 (four) hours as needed for nausea or vomiting. 4mg  ODT q4 hours prn nausea/vomit 10/18/18  Yes Deno Etienne, DO  pravastatin (PRAVACHOL) 40 MG tablet Take 40 mg by mouth daily.  09/28/17  Yes [provider]  senna-docusate (SENOKOT-S) 8.6-50 MG tablet Take 1-2 tablets by mouth every evening.   Yes [provider]  zolpidem (AMBIEN) 10 MG tablet TAKE 1 TABLET BY MOUTH EVERY DAY AT BEDTIME AS NEEDED FOR SLEEP Patient taking differently: Take 10 mg by mouth at bedtime as needed for sleep. TAKE 1 TABLET BY MOUTH EVERY DAY AT BEDTIME AS NEEDED FOR SLEEP 07/18/18  Yes Leamon Arnt, MD    Family History Family History  Problem Relation Age of Onset  . COPD Mother   . Emphysema Mother   . Osteoarthritis Mother   . Rheum arthritis Mother   . Arthritis Mother   . Hypertension Mother   . Prostate cancer Father        w/mets  . Arthritis Father   . Birth defects Father   . Cancer Father   . Hypertension Father   . Arthritis Daughter   . Asthma Daughter   . Birth defects Daughter   .  Depression Daughter   . Hearing loss Daughter   . Mental retardation Daughter   . Kidney Stones Daughter   . Arthritis Brother   . Hyperlipidemia Brother   . Coronary artery disease Paternal Grandfather   . Diabetes Paternal Aunt        x 3  . Arthritis Maternal Grandmother   . Colon cancer Neg Hx     Social History Social History   Tobacco Use  . Smoking status: Never Smoker  . Smokeless tobacco: Never Used  Substance Use Topics  . Alcohol use: Yes    Alcohol/week: 1.0 standard drinks    Types: 1 Standard drinks or equivalent per week    Comment: rarely  . Drug use: No     Allergies   Morphine   Review of Systems Review of Systems  All other systems reviewed and are negative.    Physical Exam Updated Vital Signs BP (!) 142/65 (BP Location: Right Arm)   Pulse 96   Temp 98.6 F (37 C) (Oral)   Resp 17   Ht 5\' 2"  (1.575 m)   Wt 95.3 kg   SpO2 96%   BMI 38.41 kg/m   Physical Exam Vitals signs and nursing note reviewed.  Constitutional:      General: She is not in acute distress.    Appearance: She is well-developed.  HENT:     Head: Normocephalic and  atraumatic.  Neck:     Musculoskeletal: Normal range of motion.  Cardiovascular:     Rate and Rhythm: Normal rate and regular rhythm.     Heart sounds: Normal heart sounds.  Pulmonary:     Effort: Pulmonary effort is normal.     Breath sounds: Normal breath sounds.  Abdominal:     General: There is no distension.     Palpations: Abdomen is soft.     Tenderness: There is no abdominal tenderness.  Musculoskeletal: Normal range of motion.  Skin:    General: Skin is warm and dry.  Neurological:     Mental Status: She is alert and oriented to person, place, and time.  Psychiatric:        Judgment: Judgment normal.      ED Treatments / Results  Labs (all labs ordered are listed, but only abnormal results are displayed) Labs Reviewed  COMPREHENSIVE METABOLIC PANEL - Abnormal; Notable for the following components:      Result Value   Glucose, Bld 143 (*)    BUN 27 (*)    Creatinine, Ser 1.62 (*)    Calcium 8.7 (*)    Total Protein 6.3 (*)    Albumin 2.8 (*)    GFR calc non Af Amer 32 (*)    GFR calc Af Amer 37 (*)    All other components within normal limits  CBC WITH DIFFERENTIAL/PLATELET - Abnormal; Notable for the following components:   Hemoglobin 11.9 (*)    All other components within normal limits  URINALYSIS, ROUTINE W REFLEX MICROSCOPIC - Abnormal; Notable for the following components:   APPearance HAZY (*)    Leukocytes,Ua LARGE (*)    WBC, UA >50 (*)    Bacteria, UA RARE (*)    All other components within normal limits  CULTURE, BLOOD (ROUTINE X 2)  CULTURE, BLOOD (ROUTINE X 2)  PROTIME-INR    EKG None  Radiology No results found.  Procedures Procedures (including critical care time)  Medications Ordered in ED Medications  cefTRIAXone (ROCEPHIN) 2 g in sodium chloride 0.9 %  100 mL IVPB (2 g Intravenous New Bag/Given 10/21/18 1425)  fentaNYL (SUBLIMAZE) injection 100 mcg (has no administration in time range)     Initial Impression / Assessment and  Plan / ED Course  I have reviewed the triage vital signs and the nursing notes.  Pertinent labs & imaging results that were available during my care of the patient were reviewed by me and considered in my medical decision making (see chart for details).        No significant findings and the right lower quadrant on examination.  No signs of cellulitis or abscess.  CT scan from 28 April was reviewed.  Patient with positive blood cultures.  Repeat labs and blood cultures now.  IV Rocephin.  Admission to the hospital.  No known COVID-19 contacts.  Repeat urinalysis and urine culture will be obtained  Final Clinical Impressions(s) / ED Diagnoses   Final diagnoses:  None    ED Discharge Orders    None       Jola Schmidt, MD 10/21/18 1454

## 2018-10-21 NOTE — Consult Note (Signed)
Urology Consult   Physician requesting consult: Dr. Mingo Amber  Reason for consult: Left hydronephrosis  History of Present Illness: Miranda Mathis is a 68 y.o. who has a history of chronic right-sided hydronephrosis dating back multiple years.  This is been proven to be nonobstructive based on a prior Lasix renal scan.    For the past 2 months, she has complained of right-sided flank and abdominal pain radiating to her right groin.  This does not appear to be positional.  Her pain is always present although does wax and wane.  She does not note any exacerbating or remitting factors.  She had denied any other symptoms until approximately 1 week ago when she began developing fever.  This progressed and she had a fever to 103 on Monday prompting her to present to [facility].    Of note, she did have a CT scan in early March, presumably due to her right-sided abdominal pain symptoms, that indicated improvement of her right hydronephrosis but presence of new left-sided hydronephrosis and hydroureter down to the iliac vessels.    On 10/18/2018, her evaluation in the emergency department revealed a urinalysis that was not particularly concerning.  Her urine culture from that date subsequently returned negative.  She was treated with empiric antibiotic therapy and discharged home.  Her creatinine at that time was 1.58 which was slightly elevated compared to baseline of 0.9.  She has been clinically improving and has been afebrile for the last few days.  Her pain symptoms are slightly improved on the right side.  She denies any left-sided pain.  She was called in told that her blood culture from the 28th was positive for E coli and she was instructed to present to the [facility] emergency department to be admitted for IV antibiotic therapy.  She did have a CT scan on the 28th that confirmed persistent and progressive left-sided hydronephrosis down to a distinct transition point at the left  iliac vessels.  No clear source for these findings was noted.  She did not have an obstructing stone.  Again, her right-sided hydronephrosis was significantly improved compared to prior imaging.  Past Medical History:  Diagnosis Date  . Allergic rhinitis, cause unspecified   . Barrett's esophagus   . Chicken pox   . Chronic back pain   . Chronic kidney disease    Stones  . Colon polyps   . DDD (degenerative disc disease), lumbar   . Depression   . Fibromyalgia   . GERD (gastroesophageal reflux disease)   . Headache(784.0)   . History of UTI   . Hyperlipidemia   . Hypothyroid 11/02/2013 dx  . IBS (irritable bowel syndrome)   . Stone, bladder   . Ureteral stricture, right     Past Surgical History:  Procedure Laterality Date  . ABDOMINAL HYSTERECTOMY  1987  . BALLOON DILATION  07/10/2011   Procedure: BALLOON DILATION;  Surgeon: Fredricka Bonine, MD;  Location: [facility];  Service: Urology;  Laterality: Right;  . CESAREAN SECTION  X2  . CYSTO/ BILATERAL URETEROSCOPY / URETERAL BX'S/ BILATERAL URETERAL STENT PLACEMENT/ BLADDER STONE EXTRACTION  03-13-2011  . CYSTOSCOPY W/ RETROGRADES  07/10/2011   Procedure: CYSTOSCOPY WITH RETROGRADE PYELOGRAM;  Surgeon: Fredricka Bonine, MD;  Location: [facility];  Service: Urology;  Laterality: Right;  . CYSTOSCOPY W/ URETERAL STENT PLACEMENT  07/10/2011   Procedure: CYSTOSCOPY WITH STENT REPLACEMENT;  Surgeon: Fredricka Bonine, MD;  Location: [facility];  Service: Urology;  Laterality: Right;  . CYSTOSCOPY W/ URETERAL STENT REMOVAL  07/10/2011   Procedure: CYSTOSCOPY WITH STENT REMOVAL;  Surgeon: Fredricka Bonine, MD;  Location: [facility];  Service: Urology;  Laterality: Right;  . GALLBLADDER SURGERY    . LUMBAR MICRODISCECTOMY  1990'S   L5 - S1  . RIGHT FOOT SURG  2003   HEEL  . RIGHT URETEROSCOPIC / URETERAL BX/ STENT PLACEMENT  04-17-2011      Current Hospital Medications:  Home meds:  No current facility-administered medications on file prior to encounter.    Current Outpatient Medications on File Prior to Encounter  Medication Sig Dispense Refill  . acetaminophen (TYLENOL) 500 MG tablet Take 1,000 mg by mouth every 4 (four) hours as needed. pain     . amitriptyline (ELAVIL) 25 MG tablet TAKE 1 TABLET BY MOUTH EVERYDAY AT BEDTIME (Patient taking differently: Take 25 mg by mouth at bedtime. ) 90 tablet 3  . betamethasone dipropionate 0.05 % lotion Apply 1 application topically daily as needed. 60 mL 2  . cholecalciferol (VITAMIN D) 1000 units tablet Take 2,000 Units by mouth daily.    . clotrimazole-betamethasone (LOTRISONE) cream Apply 1 application topically 2 (two) times daily.   2  . escitalopram (LEXAPRO) 20 MG tablet TAKE 1 TABLET BY MOUTH EVERY DAY (Patient taking differently: Take 20 mg by mouth daily. ) 90 tablet 1  . gabapentin (NEURONTIN) 300 MG capsule Take 1 capsule (300 mg total) by mouth 3 (three) times daily. 270 capsule 1  . ketoconazole (NIZORAL) 2 % shampoo Apply 1 application topically 2 (two) times a week.    [name] ketorolac (TORADOL) 10 MG tablet Take 1 tablet (10 mg total) by mouth every 6 (six) hours as needed. 30 tablet 0  . levothyroxine (SYNTHROID, LEVOTHROID) 137 MCG tablet Take 1 tablet (137 mcg total) by mouth daily before breakfast. 90 tablet 3  . lisinopril (PRINIVIL,ZESTRIL) 5 MG tablet Take 1 tablet (5 mg total) by mouth daily. 90 tablet 3  . meloxicam (MOBIC) 15 MG tablet Take 1 tablet (15 mg total) by mouth daily. 90 tablet 3  . morphine (MSIR) 15 MG tablet Take 1 tablet (15 mg total) by mouth every 4 (four) hours as needed for severe pain. 7 tablet 0  . omeprazole (PRILOSEC) 40 MG capsule Take 1 capsule (40 mg total) by mouth daily. 90 capsule 1  . ondansetron (ZOFRAN ODT) 4 MG disintegrating tablet 4mg  ODT q4 hours prn nausea/vomit (Patient taking differently: Take 4 mg by mouth every 4 (four)  hours as needed for nausea or vomiting. 4mg  ODT q4 hours prn nausea/vomit) 20 tablet 0  . pravastatin (PRAVACHOL) 40 MG tablet Take 40 mg by mouth daily.     [name] senna-docusate (SENOKOT-S) 8.6-50 MG tablet Take 1-2 tablets by mouth every evening.    . zolpidem (AMBIEN) 10 MG tablet TAKE 1 TABLET BY MOUTH EVERY DAY AT BEDTIME AS NEEDED FOR SLEEP (Patient taking differently: Take 10 mg by mouth at bedtime as needed for sleep. TAKE 1 TABLET BY MOUTH EVERY DAY AT BEDTIME AS NEEDED FOR SLEEP) 30 tablet 5     Scheduled Meds: . amitriptyline  25 mg Oral QHS  . cholecalciferol  2,000 Units Oral Daily  . clotrimazole   Topical BID  . enoxaparin (LOVENOX) injection  40 mg Subcutaneous Q24H  . [START ON 10/22/2018] escitalopram  20 mg Oral Daily  . insulin aspart  0-9 Units Subcutaneous TID WC  . [  START ON 10/22/2018] levothyroxine  137 mcg Oral QAC breakfast  . pantoprazole  40 mg Oral Daily  . pravastatin  40 mg Oral Q2000   Continuous Infusions: . sodium chloride 125 mL/hr at 10/21/18 1755  . cefTRIAXone (ROCEPHIN)  IV Stopped (10/21/18 1558)   PRN Meds:.acetaminophen, traMADol, zolpidem  Allergies:  Allergies  Allergen Reactions  . Morphine Itching    REACTION: Severe itching    Family History  Problem Relation Age of Onset  . COPD Mother   . Emphysema Mother   . Osteoarthritis Mother   . Rheum arthritis Mother   . Arthritis Mother   . Hypertension Mother   . Prostate cancer Father        w/mets  . Arthritis Father   . Birth defects Father   . Cancer Father   . Hypertension Father   . Arthritis Daughter   . Asthma Daughter   . Birth defects Daughter   . Depression Daughter   . Hearing loss Daughter   . Mental retardation Daughter   . Kidney Stones Daughter   . Arthritis Brother   . Hyperlipidemia Brother   . Coronary artery disease Paternal Grandfather   . Diabetes Paternal Aunt        x 3  . Arthritis Maternal Grandmother   . Colon cancer Neg Hx     Social History:   reports that she has never smoked. She has never used smokeless tobacco. She reports current alcohol use of about 1.0 standard drinks of alcohol per week. She reports that she does not use drugs.  ROS: A complete review of systems was performed.  All systems are negative except for pertinent findings as noted.  Physical Exam:  Vital signs in last 24 hours: Temp:  [98.6 F (37 C)-98.7 F (37.1 C)] 98.7 F (37.1 C) (05/01 1728) Pulse Rate:  [64-96] 67 (05/01 1728) Resp:  [17-18] 18 (05/01 1728) BP: (108-142)/(59-82) 124/74 (05/01 1728) SpO2:  [88 %-100 %] 100 % (05/01 1728) Weight:  [95.2 kg-95.3 kg] 95.3 kg (05/01 1159) Constitutional:  Alert and oriented, No acute distress Cardiovascular: Regular rate and rhythm, No JVD Respiratory: Normal respiratory effort, Lungs clear bilaterally GI:  Abdomen is obese with mild tenderness diffusely across the lower abdomen.  No rebound tenderness or guarding. GU:   No right CVA tenderness.  Moderate left CVA tenderness. Lymphatic: No lymphadenopathy Neurologic: Grossly intact, no focal deficits Psychiatric: Normal mood and affect  Laboratory Data:  Recent Labs    10/21/18 1120  WBC 6.1  HGB 11.9*  HCT 37.3  PLT 209    Recent Labs    10/21/18 1120  NA 139  K 4.1  CL 106  GLUCOSE 143*  BUN 27*  CALCIUM 8.7*  CREATININE 1.62*     Results for orders placed or performed during the hospital encounter of 10/21/18 (from the past 24 hour(s))  Urinalysis, Routine w reflex microscopic     Status: Abnormal   Collection Time: 10/21/18 11:15 AM  Result Value Ref Range   Color, Urine YELLOW YELLOW   APPearance HAZY (A) CLEAR   Specific Gravity, Urine 1.017 1.005 - 1.030   pH 5.0 5.0 - 8.0   Glucose, UA NEGATIVE NEGATIVE mg/dL   Hgb urine dipstick NEGATIVE NEGATIVE   Bilirubin Urine NEGATIVE NEGATIVE   Ketones, ur NEGATIVE NEGATIVE mg/dL   Protein, ur NEGATIVE NEGATIVE mg/dL   Nitrite NEGATIVE NEGATIVE   Leukocytes,Ua LARGE (A)  NEGATIVE   RBC / HPF 0-5 0 -  5 RBC/hpf   WBC, UA >50 (H) 0 - 5 WBC/hpf   Bacteria, UA RARE (A) NONE SEEN   Squamous Epithelial / LPF 0-5 0 - 5   Mucus PRESENT    Hyaline Casts, UA PRESENT   Comprehensive metabolic panel     Status: Abnormal   Collection Time: 10/21/18 11:20 AM  Result Value Ref Range   Sodium 139 135 - 145 mmol/L   Potassium 4.1 3.5 - 5.1 mmol/L   Chloride 106 98 - 111 mmol/L   CO2 26 22 - 32 mmol/L   Glucose, Bld 143 (H) 70 - 99 mg/dL   BUN 27 (H) 8 - 23 mg/dL   Creatinine, Ser 1.62 (H) 0.44 - 1.00 mg/dL   Calcium 8.7 (L) 8.9 - 10.3 mg/dL   Total Protein 6.3 (L) 6.5 - 8.1 g/dL   Albumin 2.8 (L) 3.5 - 5.0 g/dL   AST 31 15 - 41 U/L   ALT 37 0 - 44 U/L   Alkaline Phosphatase 80 38 - 126 U/L   Total Bilirubin 0.5 0.3 - 1.2 mg/dL   GFR calc non Af Amer 32 (L) >60 mL/min   GFR calc Af Amer 37 (L) >60 mL/min   Anion gap 7 5 - 15  CBC with Differential     Status: Abnormal   Collection Time: 10/21/18 11:20 AM  Result Value Ref Range   WBC 6.1 4.0 - 10.5 K/uL   RBC 4.17 3.87 - 5.11 MIL/uL   Hemoglobin 11.9 (L) 12.0 - 15.0 g/dL   HCT 37.3 36.0 - 46.0 %   MCV 89.4 80.0 - 100.0 fL   MCH 28.5 26.0 - 34.0 pg   MCHC 31.9 30.0 - 36.0 g/dL   RDW 14.2 11.5 - 15.5 %   Platelets 209 150 - 400 K/uL   nRBC 0.0 0.0 - 0.2 %   Neutrophils Relative % 69 %   Neutro Abs 4.2 1.7 - 7.7 K/uL   Lymphocytes Relative 17 %   Lymphs Abs 1.0 0.7 - 4.0 K/uL   Monocytes Relative 8 %   Monocytes Absolute 0.5 0.1 - 1.0 K/uL   Eosinophils Relative 5 %   Eosinophils Absolute 0.3 0.0 - 0.5 K/uL   Basophils Relative 1 %   Basophils Absolute 0.0 0.0 - 0.1 K/uL   Immature Granulocytes 0 %   Abs Immature Granulocytes 0.02 0.00 - 0.07 K/uL  Protime-INR     Status: None   Collection Time: 10/21/18 11:20 AM  Result Value Ref Range   Prothrombin Time 14.2 11.4 - 15.2 seconds   INR 1.1 0.8 - 1.2  Sodium, urine, random     Status: None   Collection Time: 10/21/18  4:23 PM  Result Value Ref  Range   Sodium, Ur 54 mmol/L  Creatinine, urine, random     Status: None   Collection Time: 10/21/18  4:23 PM  Result Value Ref Range   Creatinine, Urine 82.23 mg/dL  Hemoglobin A1c     Status: Abnormal   Collection Time: 10/21/18  4:35 PM  Result Value Ref Range   Hgb A1c MFr Bld 7.0 (H) 4.8 - 5.6 %   Mean Plasma Glucose 154.2 mg/dL  Glucose, capillary     Status: Abnormal   Collection Time: 10/21/18  5:29 PM  Result Value Ref Range   Glucose-Capillary 105 (H) 70 - 99 mg/dL   Recent Results (from the past 240 hour(s))  Blood Culture (routine x 2)     Status: None (Preliminary  result)   Collection Time: 10/18/18  4:55 PM  Result Value Ref Range Status   Specimen Description   Final    BLOOD LEFT ANTECUBITAL Performed at [facility], [place] [address], [place], [place] [address]    Special Requests   Final    BOTTLES DRAWN AEROBIC AND ANAEROBIC Blood Culture adequate volume Performed at [facility], [address] [place], [place] [address]    Culture  Setup Time   Final    GRAM NEGATIVE RODS ANAEROBIC BOTTLE ONLY CRITICAL RESULT CALLED TO, READ BACK BY AND VERIFIED WITH: SCurt Bears RN, AT [phone] 10/21/18 BY Rush Landmark Performed at [facility], [place] [address], [place], [place] [address]    Culture GRAM NEGATIVE RODS  Final   Report Status PENDING  Incomplete  Blood Culture ID Panel (Reflexed)     Status: Abnormal   Collection Time: 10/18/18  4:55 PM  Result Value Ref Range Status   Enterococcus species NOT DETECTED NOT DETECTED Final   Listeria monocytogenes NOT DETECTED NOT DETECTED Final   Staphylococcus species NOT DETECTED NOT DETECTED Final   Staphylococcus aureus (BCID) NOT DETECTED NOT DETECTED Final   Streptococcus species NOT DETECTED NOT DETECTED Final   Streptococcus agalactiae NOT DETECTED NOT DETECTED Final   Streptococcus pneumoniae NOT DETECTED NOT DETECTED Final   Streptococcus pyogenes NOT DETECTED NOT DETECTED Final   Acinetobacter  baumannii NOT DETECTED NOT DETECTED Final   Enterobacteriaceae species DETECTED (A) NOT DETECTED Final    Comment: Enterobacteriaceae represent a large family of gram-negative bacteria, not a single organism. CRITICAL RESULT CALLED TO, READ BACK BY AND VERIFIED WITH: S. COBLE RN, AT [phone] 10/21/18 BY D. VANHOOK    Enterobacter cloacae complex NOT DETECTED NOT DETECTED Final   Escherichia coli DETECTED (A) NOT DETECTED Final    Comment: CRITICAL RESULT CALLED TO, READ BACK BY AND VERIFIED WITH: S. COBLE RN, AT 0277 10/21/18 BY D. VANHOOK    Klebsiella oxytoca NOT DETECTED NOT DETECTED Final   Klebsiella pneumoniae NOT DETECTED NOT DETECTED Final   Proteus species NOT DETECTED NOT DETECTED Final   Serratia marcescens NOT DETECTED NOT DETECTED Final   Carbapenem resistance NOT DETECTED NOT DETECTED Final   Haemophilus influenzae NOT DETECTED NOT DETECTED Final   Neisseria meningitidis NOT DETECTED NOT DETECTED Final   Pseudomonas aeruginosa NOT DETECTED NOT DETECTED Final   Candida albicans NOT DETECTED NOT DETECTED Final   Candida glabrata NOT DETECTED NOT DETECTED Final   Candida krusei NOT DETECTED NOT DETECTED Final   Candida parapsilosis NOT DETECTED NOT DETECTED Final   Candida tropicalis NOT DETECTED NOT DETECTED Final    Comment: Performed at [facility], [place]. [address], [place], [place] [address]  Urine culture     Status: None   Collection Time: 10/18/18  4:58 PM  Result Value Ref Range Status   Specimen Description   Final    URINE, CLEAN CATCH Performed at [facility], [address] [place], [place] [address]    Special Requests   Final    NONE Performed at [facility], [place] [place], [place] [address]    Culture   Final    NO GROWTH Performed at [facility], [place] [address] [place], [place] [address]    Report Status 10/19/2018 FINAL  Final  Blood Culture (routine x 2)     Status: None (Preliminary result)   Collection  Time: 10/18/18  5:05 PM  Result Value Ref Range Status   Specimen Description   Final    BLOOD LEFT HAND Performed at [facility] [address] [place], [place] [address]    Special Requests   Final    BOTTLES DRAWN AEROBIC AND ANAEROBIC Blood Culture adequate volume Performed at [facility], [address] [place], [place] [address]    Culture   Final    NO GROWTH 3 DAYS Performed at [facility], [place] [address] [place], [place] [address]    Report Status PENDING  Incomplete    Renal Function: Recent Labs    10/18/18 1708 10/21/18 1120  CREATININE 1.57* 1.62*   Estimated Creatinine Clearance: 35.8 mL/min (A) (by C-G formula based on SCr of 1.62 mg/dL (H)).  Radiologic Imaging:  I independently reviewed her past imaging studies as described above.  Impression/Recommendation: 1.   Left hydronephrosis/ hydroureter:  It is difficult to determine whether her left hydronephrosis is related to her recent infection or not considering her negative urine culture despite her positive blood culture.  However, she does have significant pyuria today and has previously had a cholecystectomy which would make a source for E coli most likely a genitourinary source.   She is clinically improving with regard to her infection and does not have obvious evidence of complete left ureteral obstruction.  As such, I would recommend continuing her current antibiotic therapy.   If she does develop fever or has evidence of clinical deterioration, she would benefit from left ureteral stent placement acutely.  If her renal function worsens, this also would be another indication to consider left ureteral stent placement during this hospitalization.   In the absence of an indication for urgent intervention, she will otherwise need outpatient follow-up with Dr. Junious Silk and will ultimately require further endoscopic evaluation for interrogation of her left hydronephrosis.  Dutch Gray 10/21/2018, 6:54 PM  Pryor Curia. MD   CC: Dr. Mingo Amber

## 2018-10-21 NOTE — ED Triage Notes (Signed)
Pt in after being called and told she has +blood cx's, also c/o intermittent fevers. Seen by doc recently for dysuria, is being ruled out for pyelo or renal stone.

## 2018-10-22 ENCOUNTER — Inpatient Hospital Stay (HOSPITAL_COMMUNITY): Payer: Medicare Other | Admitting: Certified Registered"

## 2018-10-22 ENCOUNTER — Inpatient Hospital Stay (HOSPITAL_COMMUNITY): Payer: Medicare Other

## 2018-10-22 ENCOUNTER — Encounter (HOSPITAL_COMMUNITY)
Admission: EM | Disposition: A | Discharge status: Home / Self Care | Payer: Self-pay | Source: Home / Self Care | Attending: Family Medicine

## 2018-10-22 DIAGNOSIS — R7881 Bacteremia: Secondary | ICD-10-CM

## 2018-10-22 DIAGNOSIS — N1330 Unspecified hydronephrosis: Secondary | ICD-10-CM

## 2018-10-22 HISTORY — PX: CYSTOSCOPY W/ URETERAL STENT PLACEMENT: SHX1429

## 2018-10-22 LAB — BASIC METABOLIC PANEL
Anion gap: 12 (ref 5–15)
BUN: 23 mg/dL (ref 8–23)
CO2: 20 mmol/L — ABNORMAL LOW (ref 22–32)
Calcium: 8.2 mg/dL — ABNORMAL LOW (ref 8.9–10.3)
Chloride: 109 mmol/L (ref 98–111)
Creatinine, Ser: 1.43 mg/dL — ABNORMAL HIGH (ref 0.44–1.00)
GFR calc Af Amer: 44 mL/min — ABNORMAL LOW (ref >60)
GFR calc non Af Amer: 38 mL/min — ABNORMAL LOW (ref >60)
Glucose, Bld: 111 mg/dL — ABNORMAL HIGH (ref 70–99)
Potassium: 4.6 mmol/L (ref 3.5–5.1)
Sodium: 141 mmol/L (ref 135–145)

## 2018-10-22 LAB — CBC
HCT: 35.9 % — ABNORMAL LOW (ref 36.0–46.0)
Hemoglobin: 11.2 g/dL — ABNORMAL LOW (ref 12.0–15.0)
MCH: 28.1 pg (ref 26.0–34.0)
MCHC: 31.2 g/dL (ref 30.0–36.0)
MCV: 90.2 fL (ref 80.0–100.0)
Platelets: 199 10*3/uL (ref 150–400)
RBC: 3.98 MIL/uL (ref 3.87–5.11)
RDW: 14.5 % (ref 11.5–15.5)
WBC: 7.4 10*3/uL (ref 4.0–10.5)
nRBC: 0 % (ref 0.0–0.2)

## 2018-10-22 LAB — HIV ANTIBODY (ROUTINE TESTING W REFLEX): HIV Screen 4th Generation wRfx: NONREACTIVE

## 2018-10-22 LAB — GLUCOSE, CAPILLARY
Glucose-Capillary: 101 mg/dL — ABNORMAL HIGH (ref 70–99)
Glucose-Capillary: 112 mg/dL — ABNORMAL HIGH (ref 70–99)
Glucose-Capillary: 124 mg/dL — ABNORMAL HIGH (ref 70–99)
Glucose-Capillary: 134 mg/dL — ABNORMAL HIGH (ref 70–99)

## 2018-10-22 LAB — URINE CULTURE: Culture: <10000 — AB

## 2018-10-22 LAB — MRSA PCR SCREENING: MRSA by PCR: POSITIVE — AB

## 2018-10-22 SURGERY — CYSTOSCOPY, WITH RETROGRADE PYELOGRAM AND URETERAL STENT INSERTION
Anesthesia: Monitor Anesthesia Care | Site: Ureter | Laterality: Bilateral

## 2018-10-22 MED ORDER — PROPOFOL 500 MG/50ML IV EMUL
INTRAVENOUS | Status: DC | PRN
  Administered 2018-10-22: 100 ug/kg/min via INTRAVENOUS

## 2018-10-22 MED ORDER — PROPOFOL 10 MG/ML IV BOLUS
INTRAVENOUS | Status: DC | PRN
  Administered 2018-10-22: 20 mg via INTRAVENOUS

## 2018-10-22 MED ORDER — LACTATED RINGERS IV SOLN
INTRAVENOUS | Status: DC
  Administered 2018-10-22: 13:00:00 via INTRAVENOUS

## 2018-10-22 MED ORDER — LIDOCAINE HCL URETHRAL/MUCOSAL 2 % EX GEL
CUTANEOUS | Status: AC
  Filled 2018-10-22: qty 20

## 2018-10-22 MED ORDER — PROPOFOL 10 MG/ML IV BOLUS
INTRAVENOUS | Status: AC
  Filled 2018-10-22: qty 20

## 2018-10-22 MED ORDER — MIDAZOLAM HCL 2 MG/2ML IJ SOLN
INTRAMUSCULAR | Status: AC
  Filled 2018-10-22: qty 2

## 2018-10-22 MED ORDER — MORPHINE SULFATE (PF) 2 MG/ML IV SOLN
1.0000 mg | INTRAVENOUS | Status: DC | PRN
  Administered 2018-10-22 – 2018-10-23 (×2): 1 mg via INTRAVENOUS
  Filled 2018-10-22 (×2): qty 1

## 2018-10-22 MED ORDER — SCOPOLAMINE 1 MG/3DAYS TD PT72
1.0000 | MEDICATED_PATCH | TRANSDERMAL | Status: DC
  Filled 2018-10-22: qty 1

## 2018-10-22 MED ORDER — IOHEXOL 300 MG/ML  SOLN
INTRAMUSCULAR | Status: DC | PRN
  Administered 2018-10-22: 20 mL

## 2018-10-22 MED ORDER — FENTANYL CITRATE (PF) 100 MCG/2ML IJ SOLN
INTRAMUSCULAR | Status: AC
  Administered 2018-10-22: 50 ug via INTRAVENOUS
  Filled 2018-10-22: qty 2

## 2018-10-22 MED ORDER — FENTANYL CITRATE (PF) 250 MCG/5ML IJ SOLN
INTRAMUSCULAR | Status: DC | PRN
  Administered 2018-10-22 (×2): 50 ug via INTRAVENOUS

## 2018-10-22 MED ORDER — ENOXAPARIN SODIUM 40 MG/0.4ML ~~LOC~~ SOLN
40.0000 mg | SUBCUTANEOUS | Status: DC
  Administered 2018-10-22: 40 mg via SUBCUTANEOUS
  Filled 2018-10-22: qty 0.4

## 2018-10-22 MED ORDER — FENTANYL CITRATE (PF) 100 MCG/2ML IJ SOLN
25.0000 ug | INTRAMUSCULAR | Status: DC | PRN
  Administered 2018-10-22 (×2): 50 ug via INTRAVENOUS

## 2018-10-22 MED ORDER — PROMETHAZINE HCL 25 MG/ML IJ SOLN
6.2500 mg | Freq: Three times a day (TID) | INTRAMUSCULAR | Status: DC | PRN
  Administered 2018-10-22: 6.25 mg via INTRAVENOUS
  Filled 2018-10-22: qty 1

## 2018-10-22 MED ORDER — FENTANYL CITRATE (PF) 250 MCG/5ML IJ SOLN
INTRAMUSCULAR | Status: AC
  Filled 2018-10-22: qty 5

## 2018-10-22 MED ORDER — MUPIROCIN 2 % EX OINT
1.0000 "application " | TOPICAL_OINTMENT | Freq: Two times a day (BID) | CUTANEOUS | Status: DC
  Administered 2018-10-22 – 2018-10-23 (×2): 1 via NASAL
  Filled 2018-10-22: qty 22

## 2018-10-22 MED ORDER — PROMETHAZINE HCL 25 MG/ML IJ SOLN: 6.2500 mg | INTRAMUSCULAR | Status: DC | PRN

## 2018-10-22 MED ORDER — MIDAZOLAM HCL 2 MG/2ML IJ SOLN
INTRAMUSCULAR | Status: DC | PRN
  Administered 2018-10-22: 1 mg via INTRAVENOUS

## 2018-10-22 MED ORDER — ACETAMINOPHEN 10 MG/ML IV SOLN
1000.0000 mg | Freq: Four times a day (QID) | INTRAVENOUS | Status: DC
  Administered 2018-10-22 – 2018-10-23 (×3): 1000 mg via INTRAVENOUS
  Filled 2018-10-22 (×6): qty 100

## 2018-10-22 MED ORDER — DIPHENHYDRAMINE HCL 50 MG/ML IJ SOLN: 12.5000 mg | Freq: Three times a day (TID) | INTRAMUSCULAR | Status: DC | PRN

## 2018-10-22 MED ORDER — STERILE WATER FOR IRRIGATION IR SOLN
Status: DC | PRN
  Administered 2018-10-22: 3000 mL

## 2018-10-22 MED ORDER — ONDANSETRON HCL 4 MG/2ML IJ SOLN
INTRAMUSCULAR | Status: DC | PRN
  Administered 2018-10-22: 4 mg via INTRAVENOUS

## 2018-10-22 MED ORDER — CHLORHEXIDINE GLUCONATE CLOTH 2 % EX PADS
6.0000 | MEDICATED_PAD | Freq: Every day | CUTANEOUS | Status: DC
  Administered 2018-10-23: 6 via TOPICAL

## 2018-10-22 SURGICAL SUPPLY — 25 items
BAG DRAIN URO-CYSTO SKYTR STRL (DRAIN) ×2 IMPLANT
BAG DRN UROCATH (DRAIN) ×1
BASKET LASER NITINOL 1.9FR (BASKET) IMPLANT
BSKT STON RTRVL 120 1.9FR (BASKET)
CATH URET 5FR 28IN OPEN ENDED (CATHETERS) ×2 IMPLANT
CATH URET DUAL LUMEN 6-10FR 50 (CATHETERS) IMPLANT
COVER WAND RF STERILE (DRAPES) ×2 IMPLANT
ELECT REM PT RETURN 9FT ADLT (ELECTROSURGICAL)
ELECTRODE REM PT RTRN 9FT ADLT (ELECTROSURGICAL) IMPLANT
EXTRACTOR STONE 1.7FRX115CM (UROLOGICAL SUPPLIES) IMPLANT
FIBER LASER TRAC TIP (UROLOGICAL SUPPLIES) IMPLANT
GLOVE BIO SURGEON STRL SZ7.5 (GLOVE) ×2 IMPLANT
GOWN STRL REUS W/TWL XL LVL3 (GOWN DISPOSABLE) ×3 IMPLANT
GUIDEWIRE ANG ZIPWIRE 038X150 (WIRE) IMPLANT
GUIDEWIRE STR DUAL SENSOR (WIRE) ×2 IMPLANT
INFUSOR MANOMETER BAG 3000ML (MISCELLANEOUS) IMPLANT
IV NS IRRIG 3000ML ARTHROMATIC (IV SOLUTION) ×4 IMPLANT
KIT TURNOVER KIT B (KITS) ×2 IMPLANT
MANIFOLD NEPTUNE II (INSTRUMENTS) ×2 IMPLANT
NS IRRIG 1000ML POUR BTL (IV SOLUTION) ×2 IMPLANT
PACK CYSTO (CUSTOM PROCEDURE TRAY) ×2 IMPLANT
SET IRRIG Y TYPE TUR BLADDER L (SET/KITS/TRAYS/PACK) ×2 IMPLANT
SOL PREP POV-IOD 4OZ 10% (MISCELLANEOUS) ×2 IMPLANT
STENT URET 6FRX24 CONTOUR (STENTS) ×2 IMPLANT
TUBE CONNECTING 12X1/4 (SUCTIONS) ×1 IMPLANT

## 2018-10-22 NOTE — H&P (View-Only) (Signed)
Urology Progress Note   Subjective: NAEON. Patient continues to report bilateral back and flank pain. She also reports nausea which is precluding her from eating. Would like to proceed with placement of ureteral stent, definitely left and possible bilateral.   Objective: Vital signs in last 24 hours: Temp:  [98.1 F (36.7 C)-99.3 F (37.4 C)] 98.6 F (37 C) (05/02 0947) Pulse Rate:  [64-96] 72 (05/02 0947) Resp:  [17-19] 17 (05/02 0548) BP: (108-161)/(56-82) 150/62 (05/02 0947) SpO2:  [88 %-100 %] 98 % (05/02 0947) Weight:  [95.2 kg-95.3 kg] 95.3 kg (05/01 1159)  Intake/Output from previous day: 05/01 0701 - 05/02 0700 In: 2225.6 [P.O.:200; I.V.:908.6; IV Piggyback:1116.9] Out: -  Intake/Output this shift: No intake/output data recorded.  Physical Exam:  General: Alert and oriented CV: regular rate Lungs: NWOB  Abdomen: Soft.  GU: Bilateral CVA tenderness Ext: NT, No erythema  Lab Results: Recent Labs    10/21/18 1120 10/22/18 0340  HGB 11.9* 11.2*  HCT 37.3 35.9*   BMET Recent Labs    10/21/18 1120 10/22/18 0340  NA 139 141  K 4.1 4.6  CL 106 109  CO2 26 20*  GLUCOSE 143* 111*  BUN 27* 23  CREATININE 1.62* 1.43*  CALCIUM 8.7* 8.2*     Studies/Results: No results found.  Assessment/Plan:  68 y.o. female with a history of chronic right hydronephrosis which was non-obstructive on renal lasix scan. She is admitted for E. Coli bacteremia on blood culture from the ED on 10/18/18, when she presented with right-sided flank and abdominal pain radiating to her groin. CT demonstrated worsening moderate left hydronephrosis down to the iliac vessels without a clear source. No stone present. It also showed mild right hydronephrosis without stone. Urine culture showed no growth. However, UA from admission yesterday shows pyuria with culture pending and she has previously had a cholecystectomy which would make a source for her E coli bacteremia most likely a GU source.   While she is afebrile and hemodynamically stable, she does report significant obstructive symptoms today with bilateral CVA tenderness and ongoing nausea. We discussed possible management options with her and she would like to proceed with ureteral stent placement today for symptomatic control. Given E. Coli bacteremia, CVA tenderness, and hydronephrosis with obstructive symptoms, we will proceed with cystoscopy, bilateral retrograde pyelograms, left ureteral stent placement, possible right ureteral stent placement.   - OR today with urology for above procedure. Discussed with OR front desk.  - Keep patient NPO. - Primary team notified via paging system. - Consent ordered.  - Continue broad-spectrum antibiotics. Consider transitioning to po antibiotics today in anticipation of possible discharge tomorrow if she does well clinically following OR.      LOS: 1 day   Dorothey Baseman 10/22/2018, 9:57 AM   I performed a history and physical examination of the patient and discussed the patients management with the resident.  I reviewed the resident's note and agree with the documented findings and plan of care.  Persistently tender left flank, nausea and AKI. Given this and her CT scan findings w/  bacteremia of presumable GU source we have elected to proceed to the OR for stent placement, which will hopefully expedite her recovery.  She will need additional work-up for her hydronephrosis as an outpatient.

## 2018-10-22 NOTE — Anesthesia Postprocedure Evaluation (Signed)
Anesthesia Post Note  Patient: Miranda Mathis  Procedure(s) Performed: CYSTOSCOPY WITH RETROGRADE PYELOGRAM/URETERAL STENT PLACEMENT-BILATERAL (Bilateral Ureter)     Patient location during evaluation: PACU Anesthesia Type: MAC Level of consciousness: awake and alert Pain management: pain level controlled Vital Signs Assessment: post-procedure vital signs reviewed and stable Respiratory status: spontaneous breathing and respiratory function stable Cardiovascular status: stable Postop Assessment: no apparent nausea or vomiting Anesthetic complications: no    Last Vitals:  Vitals:   10/22/18 1505 10/22/18 1525  BP: 132/62 133/68  Pulse: (!) 54 62  Resp: 12 15  Temp:    SpO2: 100% 98%                  Shamecca Whitebread DANIEL

## 2018-10-22 NOTE — Progress Notes (Signed)
Family Medicine Teaching Service Daily Progress Note Intern Pager: [phone]  Patient name: Miranda Mathis Medical record number: [id number] Date of birth: [birth year] Age: 68 y.o. Gender: female  Primary Care Provider: Leamon Arnt, MD Consultants: urology Code Status: full  Pt Overview and Major Events to Date:  5/1- admitted to [facility] with Ecoli bacteremia  Assessment and Plan: Miranda Mathis is a 68 y.o. female presenting with bacteremia positive for E.coli from 10/18/18. PMH is significant for nephrolithiasis with chronic hydronephrosis, fibromyalgia, HLD, HTN, hypothyroidism, T2DM, GAD, GERD, IBS with constipation, Psoriasis.  E.Coli Bacteremia: noted on cultures collected 4/28 when patient presented to ED with fever up to 103.2. Pyuria on UA. Hx of chronic nephrolithiasis. - 1L NS fluid bolus followed by 131mL/hr NS IV fluids - Continue Ceftriaxone 2g IV - Tylenol/Tramadol for pain- avoid NSAIDs - Urology following, appreciate recommendations- plan to continue abx, no urgent need for stenting but if worsens, continues to have fevers, or kidney function worsens would consider stent placement - vitals per floor - strict I/O's - urine culture recollected on 5/1 unfortunately after CTX given, follow up culture results - follow up repeat blood culture recollected on 5/1  AKI: Cr on admission 1.67, baseline 0.9. Today Cr 1.43. Fena 0.8% indicating pre-renal cause - follow BMP - IV fluids at 125/hr - avoid nephrotoxic medications  T2DM: Hgb A1C (06/02/18) 6.8. Currently diet controlled. A1C 7.0 on recheck - sSSI - CBG's TID AC - patient will need to be discharged on oral DM medication pending renal function recovering, could consider metformin  Hypothyroidism: Last TSH 3.64 (08/27/17). Home meds: Synthroid 180mcg QD.  - continue home meds  Fibromyalgia  Chronic low back pain  Peripheral Neuropathy: Noted to have long-standing degenerative scoliosis along with degenerative  disc disease. Also h/o intermittent peripheral neuropathy in LE. Home Meds: OTC Tylenol, Gabapentin 300mg  TID, Elavil 25mg  for sleep, Toradol 10mg /Mobic 15mg  PRN, and Morphine PRN. Currently weaning Lorrin Mais, but continues to take it most nights.   - Continue Tylenol 1g q4 PRN, Elavil 25 qHS - [place]esner Gabapentin, Toradol, and Mobic given AKI - Begin Tramadol 50mg  q6 PRN - If patient desires, can consider restarting home Gabapentin but renally adjust  HTN: Home meds: Lisinopril 5mg  QD. BP on admission 142/65.  - [facility] home Lisinopril given AKI - monitor BP's, restart when improved if BP tolerates and AKI resolves  GAD:  Home meds: Lexapro 20mg  QD - continue home meds  GERD: Home meds: Omeprazole 40mg  QD - Continue Protonix 40 QD (home med not on formulary)  HLD:  Last lipid panel 06/02/18: Chol 152, HDL 36.2, LDL 97, Trig 95. Home meds: Pravastatin 40mg  QD - Continue Pravastatin 40mg  QD  Insomnia: Home meds: Ambien 5mg  QD and Elavil 25mg .  She takes amitriptyline in the afternoon and will take Ambien if she cannot sleep after taking amitriptyline. - Continue home Ambien and Elavil   Psoriasis  Intertrigo Chronic yeast infection of the skin along inflammatory and pannus area. Home meds: Lotrisone cream and Betamethasone. - continue Lotrimin (home med not on formulary) for intertrigo - can restart steroid cream if desires for psoriasis  FEN/GI: Regular diet Prophylaxis: Lovenox  Disposition: continued inpatient care  Subjective:  Denies fevers, continues to have discomfort along left flank/LLQ. No nausea or vomiting. Slept well. No other complaints  Objective: Temp:  [98.1 F (36.7 C)-99.3 F (37.4 C)] 98.1 F (36.7 C) (05/02 0548) Pulse Rate:  [64-96] 64 (05/02 0548) Resp:  [17-19] 17 (05/02 0548) BP: (  108-161)/(56-82) 145/56 (05/02 0548) SpO2:  [88 %-100 %] 97 % (05/02 0548) Weight:  [95.2 kg-95.3 kg] 95.3 kg (05/01 1159) Physical Exam: General: elderly woman  laying in bed in NAD Cardiovascular: RRR, no MRG Respiratory: CTAB. Normal work of breathing. Abdomen: obese abdomen, soft, non-tender, +BS. No guarding or rebound.  Extremities: warm, well perfused, no edema or cyanosis.  Laboratory: Recent Labs  Lab 10/18/18 1708 10/21/18 1120 10/22/18 0340  WBC 9.1 6.1 7.4  HGB 12.3 11.9* 11.2*  HCT 38.2 37.3 35.9*  PLT 141* 209 199   Recent Labs  Lab 10/18/18 1708 10/21/18 1120 10/22/18 0340  NA 133* 139 141  K 3.9 4.1 4.6  CL 101 106 109  CO2 21* 26 20*  BUN 17 27* 23  CREATININE 1.57* 1.62* 1.43*  CALCIUM 8.7* 8.7* 8.2*  PROT 7.3 6.3*  --   BILITOT 0.7 0.5  --   ALKPHOS 77 80  --   ALT 26 37  --   AST 26 31  --   GLUCOSE 178* 143* 111*    Blood and urine cx pending  Imaging/Diagnostic Tests: none  Steve Rattler, DO 10/22/2018, 6:48 AM PGY-3, [place] Intern pager: [phone], text pages welcome

## 2018-10-22 NOTE — Transfer of Care (Signed)
Immediate Anesthesia Transfer of Care Note  Patient: Miranda Mathis  Procedure(s) Performed: CYSTOSCOPY WITH RETROGRADE PYELOGRAM/URETERAL STENT PLACEMENT-BILATERAL (Bilateral Ureter)  Patient Location: PACU  Anesthesia Type:MAC  Level of Consciousness: awake, alert  and oriented  Airway & Oxygen Therapy: Patient Spontanous Breathing and Patient connected to nasal cannula oxygen  Post-op Assessment: Report given to RN and Post -op Vital signs reviewed and stable  Post vital signs: Reviewed and stable  Last Vitals:  Vitals Value Taken Time  BP    Temp    Pulse 56 10/22/2018  2:50 PM  Resp 13 10/22/2018  2:50 PM  SpO2 100 % 10/22/2018  2:50 PM  Vitals shown include unvalidated device data.  Last Pain:  Vitals:   10/22/18 1200  TempSrc:   PainSc: 9          Complications: No apparent anesthesia complications

## 2018-10-22 NOTE — Progress Notes (Signed)
Rings given to Mariea Clonts, RN

## 2018-10-22 NOTE — Progress Notes (Signed)
Verified with patient that morphine allergy is itching only and she is willing to take morphine for pain.

## 2018-10-22 NOTE — Progress Notes (Signed)
Spoke with Urology regarding patient's procedure.  B/L stents placed.  Patient will likely be stable from urology standpoint to d/c tomorrow.  Will monitor Cr.  Should patient have bladder spasms, can use oxybutynin.  Also okay to restart Lovenox today.  [place] Constable, D.O.  PGY-1 Family Medicine  10/22/2018 2:40 PM

## 2018-10-22 NOTE — Progress Notes (Addendum)
Urology Progress Note   Subjective: NAEON. Patient continues to report bilateral back and flank pain. She also reports nausea which is precluding her from eating. Would like to proceed with placement of ureteral stent, definitely left and possible bilateral.   Objective: Vital signs in last 24 hours: Temp:  [98.1 F (36.7 C)-99.3 F (37.4 C)] 98.6 F (37 C) (05/02 0947) Pulse Rate:  [64-96] 72 (05/02 0947) Resp:  [17-19] 17 (05/02 0548) BP: (108-161)/(56-82) 150/62 (05/02 0947) SpO2:  [88 %-100 %] 98 % (05/02 0947) Weight:  [95.2 kg-95.3 kg] 95.3 kg (05/01 1159)  Intake/Output from previous day: 05/01 0701 - 05/02 0700 In: 2225.6 [P.O.:200; I.V.:908.6; IV Piggyback:1116.9] Out: -  Intake/Output this shift: No intake/output data recorded.  Physical Exam:  General: Alert and oriented CV: regular rate Lungs: NWOB  Abdomen: Soft.  GU: Bilateral CVA tenderness Ext: NT, No erythema  Lab Results: Recent Labs    10/21/18 1120 10/22/18 0340  HGB 11.9* 11.2*  HCT 37.3 35.9*   BMET Recent Labs    10/21/18 1120 10/22/18 0340  NA 139 141  K 4.1 4.6  CL 106 109  CO2 26 20*  GLUCOSE 143* 111*  BUN 27* 23  CREATININE 1.62* 1.43*  CALCIUM 8.7* 8.2*     Studies/Results: No results found.  Assessment/Plan:  68 y.o. female with a history of chronic right hydronephrosis which was non-obstructive on renal lasix scan. She is admitted for E. Coli bacteremia on blood culture from the ED on 10/18/18, when she presented with right-sided flank and abdominal pain radiating to her groin. CT demonstrated worsening moderate left hydronephrosis down to the iliac vessels without a clear source. No stone present. It also showed mild right hydronephrosis without stone. Urine culture showed no growth. However, UA from admission yesterday shows pyuria with culture pending and she has previously had a cholecystectomy which would make a source for her E coli bacteremia most likely a GU source.   While she is afebrile and hemodynamically stable, she does report significant obstructive symptoms today with bilateral CVA tenderness and ongoing nausea. We discussed possible management options with her and she would like to proceed with ureteral stent placement today for symptomatic control. Given E. Coli bacteremia, CVA tenderness, and hydronephrosis with obstructive symptoms, we will proceed with cystoscopy, bilateral retrograde pyelograms, left ureteral stent placement, possible right ureteral stent placement.   - OR today with urology for above procedure. Discussed with OR front desk.  - Keep patient NPO. - Primary team notified via paging system. - Consent ordered.  - Continue broad-spectrum antibiotics. Consider transitioning to po antibiotics today in anticipation of possible discharge tomorrow if she does well clinically following OR.      LOS: 1 day   Dorothey Baseman 10/22/2018, 9:57 AM   I performed a history and physical examination of the patient and discussed the patients management with the resident.  I reviewed the resident's note and agree with the documented findings and plan of care.  Persistently tender left flank, nausea and AKI. Given this and her CT scan findings w/  bacteremia of presumable GU source we have elected to proceed to the OR for stent placement, which will hopefully expedite her recovery.  She will need additional work-up for her hydronephrosis as an outpatient.

## 2018-10-22 NOTE — Anesthesia Preprocedure Evaluation (Addendum)
Anesthesia Evaluation  Patient identified by MRN, date of birth, ID band Patient awake    Reviewed: Allergy & Precautions, NPO status , Patient's Chart, lab work & pertinent test results  History of Anesthesia Complications Negative for: history of anesthetic complications  Airway Mallampati: III  TM Distance: >3 FB Neck ROM: Full    Dental no notable dental hx. (+) Edentulous Upper, Edentulous Lower   Pulmonary neg pulmonary ROS,    Pulmonary exam normal        Cardiovascular negative cardio ROS Normal cardiovascular exam     Neuro/Psych  Headaches, PSYCHIATRIC DISORDERS Anxiety Depression    GI/Hepatic Neg liver ROS, GERD  ,  Endo/Other  Hypothyroidism Morbid obesity  Renal/GU      Musculoskeletal  (+) Arthritis , Fibromyalgia -  Abdominal   Peds  Hematology negative hematology ROS (+)   Anesthesia Other Findings Day of surgery medications reviewed with the patient.  Reproductive/Obstetrics                           Anesthesia Physical Anesthesia Plan  ASA: III  Anesthesia Plan: MAC   Post-op Pain Management:    Induction: Intravenous  PONV Risk Score and Plan: Ondansetron and Propofol infusion  Airway Management Planned: Natural Airway and Simple Face Mask  Additional Equipment:   Intra-op Plan:   Post-operative Plan:   Informed Consent: I have reviewed the patients History and Physical, chart, labs and discussed the procedure including the risks, benefits and alternatives for the proposed anesthesia with the patient or authorized representative who has indicated his/her understanding and acceptance.     Dental advisory given  Plan Discussed with: Anesthesiologist, CRNA and Surgeon  Anesthesia Plan Comments:        Anesthesia Quick Evaluation

## 2018-10-22 NOTE — Interval H&P Note (Signed)
History and Physical Interval Note:  10/22/2018 1:59 PM  Miranda Mathis  has presented today for surgery, with the diagnosis of bilateral hydronephrosis.  The various methods of treatment have been discussed with the patient and family. After consideration of risks, benefits and other options for treatment, the patient has consented to  Procedure(s): CYSTOSCOPY WITH RETROGRADE PYELOGRAM/URETERAL STENT PLACEMENT-BILATERAL (Bilateral) as a surgical intervention.  The patient's history has been reviewed, patient examined, no change in status, stable for surgery.  I have reviewed the patient's chart and labs.  Questions were answered to the patient's satisfaction.     Ardis Hughs

## 2018-10-22 NOTE — Op Note (Addendum)
PATIENT:  Miranda Mathis  Preoperative diagnosis:  1. Bilateral hydronephrosis 2. E. Coli bacteremia   Postoperative diagnosis:  1. Bilateral hydronephrosis 2. E. Coli bacteremia  Procedure:  1. Cystoscopy 2. Bilateral ureteral stent placement (6 x 24 cm, no dangler)  3. Bilateral retrograde pyelography with interpretation   Surgeon: Louis Meckel, M.D.  Resident: Coralie Common, MD, PhD  Anesthesia: MAC  Complications: None  EBL: Minimal  Specimens: None  Indication: Miranda Mathis is a 68 y.o. female with a history of chronic hydronephrosis who presented with bilateral hydronephrosis, bilateral hydronephrosis, . After reviewing the management options for treatment, they have elected to proceed with the above surgical procedure(s). We have discussed the potential benefits and risks of the procedure, side effects of the proposed treatment, the likelihood of the patient achieving the goals of the procedure, and any potential problems that might occur during the procedure or recuperation. Informed consent has been obtained.  Findings:  1. Mild cystocele 2. Bladder mucosa erythematous.  3. Left retrograde pyelogram demonstrated severe hydronephrosis and hydroureter to the level of narrowing in the distal ureter. Successful placement of left ureteral stent without dangler. Urine draining after stent placed was not purulent. 4. Right retrograde pyelogram demonstrated mild to moderate hydronephrosis with slightly dilated ureter. Successful placement of right ureteral stent without dangler.  Description of procedure:   The patient was taken to the operating room and general anesthesia was induced.  The patient was placed in the dorsal lithotomy position, prepped and draped in the usual sterile fashion, and preoperative antibiotics were administered. A preoperative time-out was performed.   Cystourethroscopy was performed.  The patient's urethra was examined and was normal. The  bladder was then systematically examined in its entirety. There was no evidence for any bladder tumors, or stones. Mucosa was noted to be erythematous.   Attention then turned to the left ureteral orifice and a ureteral catheter was used to intubate the ureteral orifice.  Omnipaque contrast was injected through the ureteral catheter and a retrograde pyelogram which revealed the above findings.  A Sensor guidewire was then advanced up the left ureter into the renal pelvis under fluoroscopic guidance. The wire was then backloaded through the cystoscope and a ureteral stent was advance over the wire using Seldinger technique.  The stent was positioned appropriately under fluoroscopic and cystoscopic guidance.  The wire was then removed with an adequate stent curl noted in the renal pelvis as well as in the bladder.  Attention then turned to the right ureteral orifice and a ureteral catheter was used to intubate the ureteral orifice.  Omnipaque contrast was injected through the ureteral catheter and a retrograde pyelogram which revealed the above findings.  A Sensor guidewire was then advanced up the right ureter into the renal pelvis under fluoroscopic guidance.  The wire was then backloaded through the cystoscope and a ureteral stent was advance over the wire using Seldinger technique. The stent was positioned appropriately under fluoroscopic and cystoscopic guidance. The wire was then removed with an adequate stent curl noted in the renal pelvis as well as in the bladder.   The bladder was then emptied and the procedure ended. The patient appeared to tolerate the procedure well and without complications. The patient was able to be awakened and transferred to the recovery unit in satisfactory condition.   Post-operative plan: - Return to inpatient unit for continued monitoring. - Continue broad-spectrum antibiotics pending culture results. - Monitor AM creatinine.  - Possible discharge tomorrow pending  clinic course.

## 2018-10-23 ENCOUNTER — Encounter (HOSPITAL_COMMUNITY): Payer: Self-pay | Admitting: Urology

## 2018-10-23 LAB — BASIC METABOLIC PANEL
Anion gap: 9 (ref 5–15)
BUN: 16 mg/dL (ref 8–23)
CO2: 23 mmol/L (ref 22–32)
Calcium: 8.5 mg/dL — ABNORMAL LOW (ref 8.9–10.3)
Chloride: 109 mmol/L (ref 98–111)
Creatinine, Ser: 1.25 mg/dL — ABNORMAL HIGH (ref 0.44–1.00)
GFR calc Af Amer: 51 mL/min — ABNORMAL LOW (ref >60)
GFR calc non Af Amer: 44 mL/min — ABNORMAL LOW (ref >60)
Glucose, Bld: 132 mg/dL — ABNORMAL HIGH (ref 70–99)
Potassium: 4.3 mmol/L (ref 3.5–5.1)
Sodium: 141 mmol/L (ref 135–145)

## 2018-10-23 LAB — CULTURE, BLOOD (ROUTINE X 2)
Culture: NO GROWTH
Special Requests: ADEQUATE
Special Requests: ADEQUATE

## 2018-10-23 LAB — GLUCOSE, CAPILLARY
Glucose-Capillary: 117 mg/dL — ABNORMAL HIGH (ref 70–99)
Glucose-Capillary: 117 mg/dL — ABNORMAL HIGH (ref 70–99)
Glucose-Capillary: 128 mg/dL — ABNORMAL HIGH (ref 70–99)
Glucose-Capillary: 128 mg/dL — ABNORMAL HIGH (ref 70–99)
Glucose-Capillary: 131 mg/dL — ABNORMAL HIGH (ref 70–99)

## 2018-10-23 LAB — CBC
HCT: 33.3 % — ABNORMAL LOW (ref 36.0–46.0)
Hemoglobin: 10.8 g/dL — ABNORMAL LOW (ref 12.0–15.0)
MCH: 28.9 pg (ref 26.0–34.0)
MCHC: 32.4 g/dL (ref 30.0–36.0)
MCV: 89 fL (ref 80.0–100.0)
Platelets: 168 10*3/uL (ref 150–400)
RBC: 3.74 MIL/uL — ABNORMAL LOW (ref 3.87–5.11)
RDW: 14.6 % (ref 11.5–15.5)
WBC: 5.3 10*3/uL (ref 4.0–10.5)
nRBC: 0 % (ref 0.0–0.2)

## 2018-10-23 MED ORDER — OXYBUTYNIN CHLORIDE 5 MG PO TABS: 5.0000 mg | ORAL_TABLET | Freq: Three times a day (TID) | ORAL | 0 refills | Status: DC

## 2018-10-23 MED ORDER — TRAMADOL HCL 50 MG PO TABS: 50.0000 mg | ORAL_TABLET | Freq: Four times a day (QID) | ORAL | 0 refills | Status: DC | PRN

## 2018-10-23 MED ORDER — OXYBUTYNIN CHLORIDE 5 MG PO TABS
5.0000 mg | ORAL_TABLET | Freq: Three times a day (TID) | ORAL | Status: DC
  Administered 2018-10-23 (×2): 5 mg via ORAL
  Filled 2018-10-23 (×2): qty 1

## 2018-10-23 MED ORDER — TRAMADOL HCL 50 MG PO TABS: 50.0000 mg | ORAL_TABLET | Freq: Four times a day (QID) | ORAL | Status: DC | PRN

## 2018-10-23 MED ORDER — OXYBUTYNIN CHLORIDE 5 MG PO TABS: 5.0000 mg | ORAL_TABLET | Freq: Three times a day (TID) | ORAL | Status: DC

## 2018-10-23 MED ORDER — ACETAMINOPHEN 325 MG PO TABS: 650.0000 mg | ORAL_TABLET | Freq: Four times a day (QID) | ORAL | Status: DC | PRN

## 2018-10-23 MED ORDER — MORPHINE SULFATE 15 MG PO TABS: 15.0000 mg | ORAL_TABLET | ORAL | 0 refills | Status: DC | PRN

## 2018-10-23 MED ORDER — CEPHALEXIN 500 MG PO CAPS: 500.0000 mg | ORAL_CAPSULE | Freq: Four times a day (QID) | ORAL | 0 refills | Status: AC

## 2018-10-23 MED ORDER — ONDANSETRON HCL 4 MG PO TABS: 4.0000 mg | ORAL_TABLET | Freq: Three times a day (TID) | ORAL | 0 refills | Status: DC | PRN

## 2018-10-23 NOTE — Discharge Instructions (Signed)
It was a pleasure taking care of you, Ms. Miranda Mathis!  For your blood infection, we are giving you an antibiotic that you will need to take 4 times per day.  This antibiotic is called Keflex.  You will need to take this antibiotic through 5/7.  We are also recommending that you take Tylenol up to 4 times per day for pain, tramadol up to 3 times per day for pain, and morphine only if you have breakthrough pain despite tramadol and Tylenol.  We have also prescribed Zofran to help with your nausea if you continue to have nausea.  Please continue drinking low calorie beverages to stay hydrated and help your kidneys to recover.  Please do not take any Mobic or Toradol until you have approval from your primary doctor, since these medications can hurt your kidneys.  You should also not take any of your lisinopril until your doctor says that it is okay.  Urology will set up a follow-up appointment for you with them, and you should see your primary doctor in the next week if you can.  We hope that you feel better soon!

## 2018-10-23 NOTE — Progress Notes (Signed)
Pt is c/o bladder spasms and is asking for Pyridium. Dont have an order. States she takes at home OTC. Messaged MD.   Eleanora Neighbor, RN

## 2018-10-23 NOTE — Discharge Summary (Signed)
[facility] Discharge Summary  Patient name: Miranda Mathis Medical record number: [id number] Date of birth: [birth year] Age: 68 y.o. Gender: female Date of Admission: 10/21/2018  Date of Discharge: 10/23/2018 Admitting Physician: Alveda Reasons, MD  Primary Care Provider: Leamon Arnt, MD Consultants: Urology  Indication for Hospitalization: E. coli bacteremia  Discharge Diagnoses/Problem List:  E. coli bacteremia Acute on chronic hydronephrosis Bladder spasms AKI Type 2 diabetes Hypothyroidism Fibromyalgia Chronic low back pain Peripheral neuropathy Hypertension GERD General anxiety disorder Hyperlipidemia Insomnia Psoriasis Intertrigo  Disposition: Home  Discharge Condition: Stable, improved, afebrile for over 48 hours  Discharge Exam:  General: relaxed, elderly woman laying in bed Cardiovascular: RRR, no MRG Respiratory: CTAB, normal WOB Abdomen: obese abdomen, mildly tender to palpation on bilateral flanks, soft, nondistended Extremities: trace pedal edema, warm and well perfused   Brief Hospital Course:  Ms. Bassin was admitted on 10/21/2018 after blood cultures taken on 10/18/2018 returned positive for E. coli.  She had been seen on 4/28 at [facility] for bilateral flank pain and fevers.  At that time, a CT scan showed worsening acute on chronic hydronephrosis, left greater than right without evidence of obstruction or nephrolithiasis.  Blood and urine cultures were taken at that time, and she was given p.o. morphine for pain relief.  Between 4/28 and 5/1, patient had nausea and poor p.o. intake as well as continued pain that was partially relieved by morphine.  On admission, patient was afebrile.  Blood cultures and urine cultures were obtained, but the urine culture was obtained after ceftriaxone was given.  She had an AKI with a creatinine of 1.67 up from a baseline of about 0.8.  Urology was consulted, and they agreed with the  administration of ceftriaxone and placed bilateral ureteral stents on 5/2.  During her hospitalization, patient remained afebrile, and her kidney function improved daily.  She had bladder spasms after having a cystoscopy and stents placed, so oxybutynin was started.  She was also found to have a worsening hemoglobin A1c is 7.0.  Since her pain had improved and she remained afebrile, she was appropriate for discharge on 5/3.  She was given a prescription for Keflex 500 mg 4 times daily to finish out a 7-day total course of antibiotics.  She was also given tramadol, Tylenol, and p.o. morphine for pain control, and Zofran was given for nausea.  Toradol and Mobic as well as Lisinopril were held at discharge due to patient's recent AKI.  Urology plans to set up follow-up for the patient.  Issues for Follow Up:  1. Please obtain a BMP for this patient sometime in the next week to monitor her kidney function and restart lisinopril, Toradol, and Mobic if needed if her kidneys return back to normal. 2. Patient's diabetes slightly worsened since her last hemoglobin A1c check since her hemoglobin A1c during admission was 7.0.  You can consider starting metformin once her kidney function returns back to baseline. 3. Ensure that patient has urology follow-up.  Significant Procedures: Bilateral ureteral stent placement with cystoscopy  Significant Labs and Imaging:  Recent Labs  Lab 10/21/18 1120 10/22/18 0340 10/23/18 0751  WBC 6.1 7.4 5.3  HGB 11.9* 11.2* 10.8*  HCT 37.3 35.9* 33.3*  PLT 209 199 168   Recent Labs  Lab 10/18/18 1708 10/21/18 1120 10/22/18 0340 10/23/18 0751  NA 133* 139 141 141  K 3.9 4.1 4.6 4.3  CL 101 106 109 109  CO2 21* 26 20*  23  GLUCOSE 178* 143* 111* 132*  BUN 17 27* 23 16  CREATININE 1.57* 1.62* 1.43* 1.25*  CALCIUM 8.7* 8.7* 8.2* 8.5*  ALKPHOS 77 80  --   --   AST 26 31  --   --   ALT 26 37  --   --   ALBUMIN 3.6 2.8*  --   --     Dg Chest 2 View  Result Date:  10/18/2018 CLINICAL DATA:  Sepsis EXAM: CHEST - 2 VIEW COMPARISON:  11/03/2015 FINDINGS: The heart size and mediastinal contours are within normal limits. Both lungs are clear. The visualized skeletal structures are unremarkable. IMPRESSION: No active cardiopulmonary disease. Electronically Signed   By: Franchot Gallo M.D.   On: 10/18/2018 18:32   Dg Cystogram  Result Date: 10/23/2018 CLINICAL DATA:  Hydronephrosis. EXAM: CYSTOGRAM - 3+ VIEW COMPARISON:  CT abdomen pelvis dated October 18, 2018. FLUOROSCOPY TIME:  35 seconds. C-arm fluoroscopic images were obtained intraoperatively and submitted for post operative interpretation. FINDINGS: Left retrograde pyelogram demonstrates moderate to severe hydroureteronephrosis to the level of a focal narrowing in the distal ureter. Right retrograde pyelogram demonstrates mild right hydroureteronephrosis without focal narrowing. No filling defects. Successful placement of bilateral double-J ureteral stents. IMPRESSION: 1. Moderate to severe left hydroureteronephrosis to the level of focal narrowing in the distal left ureter. Correlate with intraoperative visualization. 2. Mild right hydroureteronephrosis. 3. Successful placement of bilateral double-J ureteral stents. Electronically Signed   By: Titus Dubin M.D.   On: 10/23/2018 04:11   Ct Renal Stone Study  Result Date: 10/18/2018 CLINICAL DATA:  Right flank pain.  Nausea vomiting fever EXAM: CT ABDOMEN AND PELVIS WITHOUT CONTRAST TECHNIQUE: Multidetector CT imaging of the abdomen and pelvis was performed following the standard protocol without IV contrast. COMPARISON:  CT abdomen pelvis 08/23/2018 FINDINGS: Lower chest: Lung bases clear bilaterally. Hepatobiliary: Postop cholecystectomy without biliary dilatation. Fatty liver with enlargement of the left lobe of the liver unchanged. No focal liver lesion. Pancreas: Negative Spleen: Negative Adrenals/Urinary Tract: Mild right hydronephrosis. Proximal right ureter  dilated. No obstructing stone. Punctate right upper pole stone seen on the prior study no longer present apparently has passed in the interval. Moderate left hydronephrosis and moderate dilatation of the left ureter with transition at the iliac crossing. No stone or mass identified. Left hydroureter appears to have progressed since the prior CT. Stomach/Bowel: Negative for bowel obstruction. Negative for bowel mass or edema. Normal appendix. Vascular/Lymphatic: Negative Reproductive: Hysterectomy.  No pelvic mass Other: No free fluid Musculoskeletal: Disc degeneration and spurring L4-5 and L5-S1. No acute skeletal abnormality. IMPRESSION: 1. Moderate left hydronephrosis with mild progression since 08/23/2018. Transition at the iliac crossing. No stone identified. Urologic evaluation recommended to rule out stricture or mass. 2. Mild right hydronephrosis. No obstructing stone seen today. Previously noted punctate stone right upper pole has passed since the prior CT. 3. Hepatomegaly with fatty infiltration. Electronically Signed   By: Franchot Gallo M.D.   On: 10/18/2018 18:39     Results/Tests Pending at Time of Discharge: none  Discharge Medications:  Allergies as of 10/23/2018      Reactions   Morphine Itching   REACTION: Severe itching      Medication List    STOP taking these medications   ketorolac 10 MG tablet Commonly known as:  TORADOL   lisinopril 5 MG tablet Commonly known as:  ZESTRIL   meloxicam 15 MG tablet Commonly known as:  MOBIC     TAKE these medications  acetaminophen 500 MG tablet Commonly known as:  TYLENOL Take 1,000 mg by mouth every 4 (four) hours as needed. pain   amitriptyline 25 MG tablet Commonly known as:  ELAVIL TAKE 1 TABLET BY MOUTH EVERYDAY AT BEDTIME What changed:  See the new instructions.   betamethasone dipropionate 0.05 % lotion Apply 1 application topically daily as needed.   cephALEXin 500 MG capsule Commonly known as:  KEFLEX Take 1  capsule (500 mg total) by mouth 4 (four) times daily for 5 days.   cholecalciferol 1000 units tablet Commonly known as:  VITAMIN D Take 2,000 Units by mouth daily.   clotrimazole-betamethasone cream Commonly known as:  LOTRISONE Apply 1 application topically 2 (two) times daily.   escitalopram 20 MG tablet Commonly known as:  LEXAPRO TAKE 1 TABLET BY MOUTH EVERY DAY   gabapentin 300 MG capsule Commonly known as:  NEURONTIN Take 1 capsule (300 mg total) by mouth 3 (three) times daily.   ketoconazole 2 % shampoo Commonly known as:  NIZORAL Apply 1 application topically 2 (two) times a week.   levothyroxine 137 MCG tablet Commonly known as:  SYNTHROID Take 1 tablet (137 mcg total) by mouth daily before breakfast.   morphine 15 MG tablet Commonly known as:  MSIR Take 1 tablet (15 mg total) by mouth every 4 (four) hours as needed for severe pain. What changed:  Another medication with the same name was added. Make sure you understand how and when to take each.   morphine 15 MG tablet Commonly known as:  MSIR Take 1 tablet (15 mg total) by mouth every 4 (four) hours as needed for severe pain. What changed:  You were already taking a medication with the same name, and this prescription was added. Make sure you understand how and when to take each.   omeprazole 40 MG capsule Commonly known as:  PRILOSEC Take 1 capsule (40 mg total) by mouth daily.   ondansetron 4 MG disintegrating tablet Commonly known as:  Zofran ODT 4mg  ODT q4 hours prn nausea/vomit What changed:    how much to take  how to take this  when to take this  reasons to take this   ondansetron 4 MG tablet Commonly known as:  ZOFRAN Take 1 tablet (4 mg total) by mouth every 8 (eight) hours as needed for nausea or vomiting.   oxybutynin 5 MG tablet Commonly known as:  DITROPAN Take 1 tablet (5 mg total) by mouth 3 (three) times daily.   pravastatin 40 MG tablet Commonly known as:  PRAVACHOL Take 40 mg  by mouth daily.   senna-docusate 8.6-50 MG tablet Commonly known as:  Senokot-S Take 1-2 tablets by mouth every evening.   traMADol 50 MG tablet Commonly known as:  ULTRAM Take 1 tablet (50 mg total) by mouth every 6 (six) hours as needed for moderate pain or severe pain.   zolpidem 10 MG tablet Commonly known as:  AMBIEN TAKE 1 TABLET BY MOUTH EVERY DAY AT BEDTIME AS NEEDED FOR SLEEP What changed:    how much to take  how to take this  when to take this  reasons to take this       Discharge Instructions: Please refer to Patient Instructions section of EMR for full details.  Patient was counseled important signs and symptoms that should prompt return to medical care, changes in medications, dietary instructions, activity restrictions, and follow up appointments.   Follow-Up Appointments: Patient understands the importance of seeing her PCP in the  next week, and urology will set up follow-up at their office.  Kathrene Alu, MD 10/23/2018, 11:04 AM PGY-2, [place]

## 2018-10-23 NOTE — Progress Notes (Signed)
Urology Progress Note   Subjective: Miranda Mathis. Reports bladder spasms, improving on oxybutynin. Flank pain has improved. Intermittent nausea, no fevers.    Objective: Vital signs in last 24 hours: Temp:  [97.8 F (36.6 C)-98.6 F (37 C)] 98.6 F (37 C) (05/03 0431) Pulse Rate:  [54-72] 64 (05/03 0431) Resp:  [11-18] 18 (05/03 0431) BP: (115-150)/(48-89) 144/89 (05/03 0431) SpO2:  [95 %-100 %] 96 % (05/03 0431)  Intake/Output from previous day: 05/02 0701 - 05/03 0700 In: 878.9 [P.O.:240; I.V.:250.8; IV Piggyback:388.1] Out: 0  Intake/Output this shift: No intake/output data recorded.  Physical Exam:  General: Alert and oriented CV: regular rate Lungs: NWOB  Abdomen: Soft.  Ext: NT, No erythema  Lab Results: Recent Labs    10/21/18 1120 10/22/18 0340  HGB 11.9* 11.2*  HCT 37.3 35.9*   BMET Recent Labs    10/21/18 1120 10/22/18 0340  NA 139 141  K 4.1 4.6  CL 106 109  CO2 26 20*  GLUCOSE 143* 111*  BUN 27* 23  CREATININE 1.62* 1.43*  CALCIUM 8.7* 8.2*     Studies/Results: Dg Cystogram  Result Date: 10/23/2018 CLINICAL DATA:  Hydronephrosis. EXAM: CYSTOGRAM - 3+ VIEW COMPARISON:  CT abdomen pelvis dated October 18, 2018. FLUOROSCOPY TIME:  35 seconds. C-arm fluoroscopic images were obtained intraoperatively and submitted for post operative interpretation. FINDINGS: Left retrograde pyelogram demonstrates moderate to severe hydroureteronephrosis to the level of a focal narrowing in the distal ureter. Right retrograde pyelogram demonstrates mild right hydroureteronephrosis without focal narrowing. No filling defects. Successful placement of bilateral double-J ureteral stents. IMPRESSION: 1. Moderate to severe left hydroureteronephrosis to the level of focal narrowing in the distal left ureter. Correlate with intraoperative visualization. 2. Mild right hydroureteronephrosis. 3. Successful placement of bilateral double-J ureteral stents. Electronically Signed   By: Titus Dubin M.D.   On: 10/23/2018 04:11    Assessment/Plan:  68 y.o. female with a history of chronic right hydronephrosis which was non-obstructive on renal lasix scan. She is admitted for E. Coli bacteremia on blood culture from the ED on 10/18/18, when she presented with right-sided flank and abdominal pain radiating to her groin. CT demonstrated worsening moderate left hydronephrosis down to the iliac vessels without a clear source. No stone present. It also showed mild right hydronephrosis without stone. Urine culture showed no growth. However, UA from admission showed pyuria and culture with < 10,000 colonies. Since she has previously had a cholecystectomy, the course of her E coli bacteremia is most likely GU. Due to significant obstructive symptoms today with bilateral CVA tenderness and ongoing nausea, bilateral ureteral stents were placed on 10/23/18. She is recovering well post-operatively. Creatinine improved to 1.2 this AM.   - Defer antibiotic selection to family medicine team; appreciate their assistance with this patient. [place] for discharge from urology perspective.  - Urology will arrange outpatient follow-up for further evaluation of her hydronephrosis.  - Agree with oxybutynin for bladder spasms. Please prescribe at discharge.   Seen and discussed with Dr. Louis Meckel.

## 2018-10-23 NOTE — Progress Notes (Addendum)
Family Medicine Teaching Service Daily Progress Note Intern Pager: [phone]  Patient name: Miranda Mathis Medical record number: [id number] Date of birth: 1950-07-19 Age: 68 y.o. Gender: female  Primary Care Provider: Leamon Arnt, MD Consultants: urology Code Status: full  Pt Overview and Major Events to Date:  5/1- admitted to [facility] with Ecoli bacteremia  Assessment and Plan: Miranda Mathis Driskill is a 68 y.o. female presenting with bacteremia positive for E.coli from 10/18/18. PMH is significant for nephrolithiasis with chronic hydronephrosis, fibromyalgia, HLD, HTN, hypothyroidism, T2DM, GAD, GERD, IBS with constipation, Psoriasis.  E.Coli Bacteremia,improving: Unclear source, presumed to be urinary although urine culture has been negative x 2.  Clinically improving and has been afebrile during her hospital stay.  Since she has had concomitant acute on chronic bilateral hydronephrosis, urology was consulted, and bilateral ureteral stents were placed on 5/2.   - Transition Ceftriaxone 2g IV to Keflex (blood cx sensitive to cefazolin) 500 mg QID for total course of 7 days (last day 5/7) - Tylenol/Tramadol for pain- avoid NSAIDs - will consider sending out with small amount of PO morphine for breakthrough pain - Urology outpatient follow up - Home with zofran  Bladder spasms: Patient complained of bladder spasms on 5/2, possibly in reaction to her cystoscopy.  Per urology's recommendation, oxybutynin was started TID. -continue oxybutynin  AKI, improving: Cr on admission 1.67, baseline 0.9. Today Cr 1.25. Fena 0.8% indicating pre-renal cause - avoid nephrotoxic medications - patient will not continue toradol or Mobic on d/c   T2DM: Hgb A1C (06/02/18) 6.8. Currently diet controlled. A1C 7.0 on recheck - sSSI - CBG's TID AC - patient will need to be discharged on oral DM medication pending renal function recovering, could consider metformin  Hypothyroidism: Last TSH 3.64 (08/27/17). Home  meds: Synthroid 138mcg QD.  - continue home meds  Fibromyalgia  Chronic low back pain  Peripheral Neuropathy: Noted to have long-standing degenerative scoliosis along with degenerative disc disease. Also h/o intermittent peripheral neuropathy in LE. Home Meds: OTC Tylenol, Gabapentin 300mg  TID, Elavil 25mg  for sleep, Toradol 10mg /Mobic 15mg  PRN, and Morphine PRN. Currently weaning Lorrin Mais, but continues to take it most nights.   - Continue Tylenol 1g q4 PRN, Elavil 25 qHS - [place] Gabapentin, Toradol, and Mobic given AKI - Begin Tramadol 50mg  q6 PRN - If patient desires, can consider restarting home Gabapentin but renally adjust  HTN: Home meds: Lisinopril 5mg  QD. BP on admission 142/65.  - [place] home Lisinopril given AKI - monitor BP's, restart when improved if BP tolerates and AKI resolves  GAD:  Home meds: Lexapro 20mg  QD - continue home meds  GERD: Home meds: Omeprazole 40mg  QD - Continue Protonix 40 QD (home med not on formulary)  HLD:  Last lipid panel 06/02/18: Chol 152, HDL 36.2, LDL 97, Trig 95. Home meds: Pravastatin 40mg  QD - Continue Pravastatin 40mg  QD  Insomnia: Home meds: Ambien 5mg  QD and Elavil 25mg .  She takes amitriptyline in the afternoon and will take Ambien if she cannot sleep after taking amitriptyline. - Continue home Ambien and Elavil   Psoriasis  Intertrigo Chronic yeast infection of the skin along inflammatory and pannus area. Home meds: Lotrisone cream and Betamethasone. - continue Lotrimin (home med not on formulary) for intertrigo - can restart steroid cream if desires for psoriasis  FEN/GI: Regular diet Prophylaxis: Lovenox  Disposition: home 5/3 Subjective:  Patient says she is feeling better this morning, although she continues to have some bladder spasms and flank tenderness.  She feels  ready to go home today.  Objective: Temp:  [97.8 F (36.6 C)-98.6 F (37 C)] 98.6 F (37 C) (05/03 0431) Pulse Rate:  [54-72] 64 (05/03  0431) Resp:  [11-18] 18 (05/03 0431) BP: (115-150)/(48-89) 144/89 (05/03 0431) SpO2:  [95 %-100 %] 96 % (05/03 0431) Physical Exam: General: relaxed, elderly woman laying in bed Cardiovascular: RRR, no MRG Respiratory: CTAB, normal WOB Abdomen: obese abdomen, mildly tender to palpation on bilateral flanks, soft, nondistended Extremities: trace pedal edema, warm and well perfused  Laboratory: Recent Labs  Lab 10/18/18 1708 10/21/18 1120 10/22/18 0340  WBC 9.1 6.1 7.4  HGB 12.3 11.9* 11.2*  HCT 38.2 37.3 35.9*  PLT 141* 209 199   Recent Labs  Lab 10/18/18 1708 10/21/18 1120 10/22/18 0340  NA 133* 139 141  K 3.9 4.1 4.6  CL 101 106 109  CO2 21* 26 20*  BUN 17 27* 23  CREATININE 1.57* 1.62* 1.43*  CALCIUM 8.7* 8.7* 8.2*  PROT 7.3 6.3*  --   BILITOT 0.7 0.5  --   ALKPHOS 77 80  --   ALT 26 37  --   AST 26 31  --   GLUCOSE 178* 143* 111*    Blood and urine cx pending  Imaging/Diagnostic Tests: none  Kathrene Alu, MD 10/23/2018, 7:41 AM PGY-2, [place] Intern pager: [phone], text pages welcome

## 2018-10-23 NOTE — Progress Notes (Signed)
Karianna R Charity to be discharged home per MD order. Discussed prescriptions and follow up appointments with the patient. Prescriptions given to patient; medication list explained in detail. Patient verbalized understanding.  Skin clean, dry and intact without evidence of skin break down, no evidence of skin tears noted. IV catheter discontinued intact. Site without signs and symptoms of complications. Dressing and pressure applied. Pt denies pain at the site currently. No complaints noted.  Patient free of lines, drains, and wounds.   An After Visit Summary (AVS) was printed and given to the patient. Patient escorted via wheelchair, and discharged home via private auto.  Shela Commons, RN

## 2018-10-24 ENCOUNTER — Telehealth: Payer: Self-pay

## 2018-10-24 NOTE — Telephone Encounter (Signed)
Post ED Visit - Positive Culture Follow-up  Culture report reviewed by antimicrobial stewardship pharmacist: [place] Team []  Elenor Quinones, Pharm.D. []  Heide Guile, Pharm.D., BCPS AQ-ID []  Parks Neptune, Pharm.D., BCPS []  Alycia Rossetti, Pharm.D., BCPS []  [place], Pharm.D., BCPS, AAHIVP []  Legrand Como, Pharm.D., BCPS, AAHIVP []  Salome Arnt, PharmD, BCPS []  Johnnette Gourd, PharmD, BCPS []  Hughes Better, PharmD, BCPS []  Leeroy Cha, PharmD []  Laqueta Linden, PharmD, BCPS []  Albertina Parr, PharmD [facility] Team []  Leodis Sias, PharmD []  Lindell Spar, PharmD []  Royetta Asal, PharmD []  Graylin Shiver, Rph []  Rema Fendt) Glennon Mac, PharmD []  Arlyn Dunning, PharmD []  Netta Cedars, PharmD []  Dia Sitter, PharmD []  Leone Haven, PharmD []  Gretta Arab, PharmD []  Theodis Shove, PharmD []  Peggyann Juba, PharmD []  Reuel Boom, PharmD   Positive blood culture Treated with Cephalexin, organism sensitive to the same and no further patient follow-up is required at this time.  Genia Del 10/24/2018, 8:38 AM

## 2018-10-24 NOTE — Progress Notes (Signed)
ED Antimicrobial Stewardship Positive Culture Follow Up   Miranda Mathis is an 68 y.o. female who presented to [facility] on 10/18/2018 with a chief complaint of right lower quadrant abdominal pain. Blood cultures from 10/18/18 positive for E. Coli. Patient received 2 doses of ceftriaxone 2g IV q24h on 10/21/18 and 10/22/18 while inpatient and then discharged home on cephalexin.   Chief Complaint  Patient presents with  . Dysuria    Recent Results (from the past 720 hour(s))  Blood Culture (routine x 2)     Status: Abnormal   Collection Time: 10/18/18  4:55 PM  Result Value Ref Range Status   Specimen Description   Final    BLOOD LEFT ANTECUBITAL Performed at [facility], [place] [address], [place], [place] [address]    Special Requests   Final    BOTTLES DRAWN AEROBIC AND ANAEROBIC Blood Culture adequate volume Performed at [facility], [address] [place], [place] [address]    Culture  Setup Time   Final    GRAM NEGATIVE RODS ANAEROBIC BOTTLE ONLY CRITICAL RESULT CALLED TO, READ BACK BY AND VERIFIED WITH: SCurt Bears RN, AT [phone] 10/21/18 BY Rush Landmark Performed at [facility], [place] [address] [place],  [address]    Culture ESCHERICHIA COLI (A)  Final   Report Status 10/23/2018 FINAL  Final   Organism ID, Bacteria ESCHERICHIA COLI  Final      Susceptibility   Escherichia coli - MIC*    AMPICILLIN >=32 RESISTANT Resistant     CEFAZOLIN <=4 SENSITIVE Sensitive     CEFEPIME <=1 SENSITIVE Sensitive     CEFTAZIDIME <=1 SENSITIVE Sensitive     CEFTRIAXONE <=1 SENSITIVE Sensitive     CIPROFLOXACIN <=0.25 SENSITIVE Sensitive     GENTAMICIN >=16 RESISTANT Resistant     IMIPENEM <=0.25 SENSITIVE Sensitive     TRIMETH/SULFA >=320 RESISTANT Resistant     AMPICILLIN/SULBACTAM >=32 RESISTANT Resistant     PIP/TAZO <=4 SENSITIVE Sensitive     Extended ESBL NEGATIVE Sensitive     * ESCHERICHIA COLI  Blood Culture ID Panel (Reflexed)     Status: Abnormal   Collection Time: 10/18/18  4:55 PM  Result Value Ref Range Status   Enterococcus species NOT DETECTED NOT DETECTED Final   Listeria monocytogenes NOT DETECTED NOT DETECTED Final   Staphylococcus species NOT DETECTED NOT DETECTED Final   Staphylococcus aureus (BCID) NOT DETECTED NOT DETECTED Final   Streptococcus species NOT DETECTED NOT DETECTED Final   Streptococcus agalactiae NOT DETECTED NOT DETECTED Final   Streptococcus pneumoniae NOT DETECTED NOT DETECTED Final   Streptococcus pyogenes NOT DETECTED NOT DETECTED Final   Acinetobacter baumannii NOT DETECTED NOT DETECTED Final   Enterobacteriaceae species DETECTED (A) NOT DETECTED Final    Comment: Enterobacteriaceae represent a large family of gram-negative bacteria, not a single organism. CRITICAL RESULT CALLED TO, READ BACK BY AND VERIFIED WITH: S. COBLE RN, AT [phone] 10/21/18 BY D. VANHOOK    Enterobacter cloacae complex NOT DETECTED NOT DETECTED Final   Escherichia coli DETECTED (A) NOT DETECTED Final    Comment: CRITICAL RESULT CALLED TO, READ BACK BY AND VERIFIED WITH: S. COBLE RN, AT [phone] 10/21/18 BY D. VANHOOK    Klebsiella oxytoca NOT DETECTED NOT DETECTED Final   Klebsiella pneumoniae NOT DETECTED NOT DETECTED Final   Proteus species NOT DETECTED NOT DETECTED Final   Serratia marcescens NOT DETECTED NOT DETECTED Final   Carbapenem resistance NOT DETECTED NOT  DETECTED Final   Haemophilus influenzae NOT DETECTED NOT DETECTED Final   Neisseria meningitidis NOT DETECTED NOT DETECTED Final   Pseudomonas aeruginosa NOT DETECTED NOT DETECTED Final   Candida albicans NOT DETECTED NOT DETECTED Final   Candida glabrata NOT DETECTED NOT DETECTED Final   Candida krusei NOT DETECTED NOT DETECTED Final   Candida parapsilosis NOT DETECTED NOT DETECTED Final   Candida tropicalis NOT DETECTED NOT DETECTED Final    Comment: Performed at [facility], [place] [address] [place], [place] [address]  Urine culture     Status: None    Collection Time: 10/18/18  4:58 PM  Result Value Ref Range Status   Specimen Description   Final    URINE, CLEAN CATCH Performed at [facility], [address] [place], [place] [address]    Special Requests   Final    NONE Performed at [facility], [address] [place], [place] [address]    Culture   Final    NO GROWTH Performed at [facility], [place] [address] [place], [place] [address]    Report Status 10/19/2018 FINAL  Final  Blood Culture (routine x 2)     Status: None   Collection Time: 10/18/18  5:05 PM  Result Value Ref Range Status   Specimen Description   Final    BLOOD LEFT HAND Performed at [facility], [address] [place], [place] [address]    Special Requests   Final    BOTTLES DRAWN AEROBIC AND ANAEROBIC Blood Culture adequate volume Performed at [facility], [address] [place], [place] [address]    Culture   Final    NO GROWTH 5 DAYS Performed at [facility], [place] [address], [place], [place] [address]    Report Status 10/23/2018 FINAL  Final  Blood culture (routine x 2)     Status: None (Preliminary result)   Collection Time: 10/21/18  2:40 PM  Result Value Ref Range Status   Specimen Description BLOOD SITE NOT SPECIFIED  Final   Special Requests   Final    BOTTLES DRAWN AEROBIC AND ANAEROBIC Blood Culture adequate volume   Culture   Final    NO GROWTH 3 DAYS Performed at [facility], [address] [place], [place] [address]    Report Status PENDING  Incomplete  Blood culture (routine x 2)     Status: None (Preliminary result)   Collection Time: 10/21/18  2:41 PM  Result Value Ref Range Status   Specimen Description BLOOD SITE NOT SPECIFIED  Final   Special Requests   Final    BOTTLES DRAWN AEROBIC AND ANAEROBIC Blood Culture results may not be optimal due to an inadequate volume of blood received in culture bottles   Culture   Final    NO GROWTH 3 DAYS Performed at  [facility], [place] [address], [place], [place] [address]    Report Status PENDING  Incomplete  Urine culture     Status: Abnormal   Collection Time: 10/21/18  6:44 PM  Result Value Ref Range Status   Specimen Description URINE, RANDOM  Final   Special Requests NONE  Final   Culture (A)  Final    <10,000 COLONIES/mL INSIGNIFICANT GROWTH Performed at [facility], [place] [address] [place], [place] [address]    Report Status 10/22/2018 FINAL  Final  MRSA PCR Screening  Status: Abnormal   Collection Time: 10/22/18  1:04 PM  Result Value Ref Range Status   MRSA by PCR POSITIVE (A) NEGATIVE Final    Comment:        The GeneXpert MRSA Assay (FDA approved for NASAL specimens only), is one component of a comprehensive MRSA colonization surveillance program. It is not intended to diagnose MRSA infection nor to guide or monitor treatment for MRSA infections. CRITICAL RESULT CALLED TO, READ BACK BY AND VERIFIED WITH: RN LAURA D 1735 [phone] FCP Performed at [facility], [place] [address], [place], [place] [address]     [x]  Treated with cephalexin 500 mg PO 4 times daily for 5 days, organism sensitive to prescribed antimicrobial []  Patient discharged originally without antimicrobial agent and treatment is now indicated  New antibiotic prescription: N/A  ED Provider: Wyn Quaker, PA-C  Thank you for allowing pharmacy to be a part of this patient's care.  Leron Croak, PharmD PGY1 Pharmacy Resident Phone: [phone]  Please check AMION for all [place] phone numbers

## 2018-10-24 NOTE — Telephone Encounter (Signed)
Per Chart Review:  Patient name: Miranda Mathis  Medical record number: [id number] Date of birth: January 27, 1951        Age: 68 y.o.    Gender: female Date of Admission: 10/21/2018                        Date of Discharge: 10/23/2018 Admitting Physician: Alveda Reasons, MD  Primary Care Provider: Leamon Arnt, MD Consultants: Urology  Indication for Hospitalization: E. coli bacteremia  Discharge Diagnoses/Problem List:  E. coli bacteremia Acute on chronic hydronephrosis Bladder spasms AKI Type 2 diabetes Hypothyroidism Fibromyalgia Chronic low back pain Peripheral neuropathy Hypertension GERD General anxiety disorder Hyperlipidemia Insomnia Psoriasis Intertrigo  Disposition: Home  Discharge Condition: Stable, improved, afebrile for over 48 hours  Discharge Exam:  General:relaxed,elderly woman laying in bed Cardiovascular:RRR, no MRG Respiratory: CTAB, normal WOB Abdomen:obese abdomen, mildly tender to palpation on bilateral flanks, soft, nondistended Extremities:trace pedal edema, warm and well perfused  Issues for Follow Up:  1. Please obtain a BMP for this patient sometime in the next week to monitor her kidney function and restart lisinopril, Toradol, and Mobic if needed if her kidneys return back to normal. 2. Patient's diabetes slightly worsened since her last hemoglobin A1c check since her hemoglobin A1c during admission was 7.0.  You can consider starting metformin once her kidney function returns back to baseline. 3. Ensure that patient has urology follow-up.  Per Telephone Call:  Transition Care Management Follow-up Telephone Call   Date discharged?  10/23/2018   How have you been since you were released from the hospital? "A little rough, to be honest, but the medication they gave me is helping."   Do you understand why you were in the hospital? yes   Do you understand the discharge instructions? yes   Where were you discharged to?  Home   Items Reviewed:  Medications reviewed: yes  Allergies reviewed: yes  Dietary changes reviewed: yes  Referrals reviewed: yes   Functional Questionnaire:   Activities of Daily Living (ADLs):   She states they are independent in the following: ambulation, bathing and hygiene, feeding, continence, grooming, toileting and dressing States they require assistance with the following: N/A   Any transportation issues/concerns?: no   Any patient concerns? no   Confirmed importance and date/time of follow-up visits scheduled yes  Provider Appointment booked with Dr. Jonni Sanger on 10/27/2018 @ 10:00 am  Confirmed with patient if condition begins to worsen call PCP or go to the ER.  Patient was given the office number and encouraged to call back with question or concerns.  : yes

## 2018-10-26 LAB — CULTURE, BLOOD (ROUTINE X 2)
Culture: NO GROWTH
Culture: NO GROWTH
Special Requests: ADEQUATE

## 2018-10-27 ENCOUNTER — Encounter: Payer: Self-pay | Admitting: Family Medicine

## 2018-10-27 ENCOUNTER — Ambulatory Visit (INDEPENDENT_AMBULATORY_CARE_PROVIDER_SITE_OTHER): Payer: Medicare Other | Admitting: Family Medicine

## 2018-10-27 ENCOUNTER — Other Ambulatory Visit: Payer: Self-pay

## 2018-10-27 VITALS — Ht 62.0 in | Wt 210.0 lb

## 2018-10-27 DIAGNOSIS — N179 Acute kidney failure, unspecified: Secondary | ICD-10-CM

## 2018-10-27 DIAGNOSIS — N200 Calculus of kidney: Secondary | ICD-10-CM

## 2018-10-27 DIAGNOSIS — R7881 Bacteremia: Secondary | ICD-10-CM | POA: Diagnosis not present

## 2018-10-27 DIAGNOSIS — D649 Anemia, unspecified: Secondary | ICD-10-CM | POA: Diagnosis not present

## 2018-10-27 DIAGNOSIS — B9620 Unspecified Escherichia coli [E. coli] as the cause of diseases classified elsewhere: Secondary | ICD-10-CM

## 2018-10-27 DIAGNOSIS — E1142 Type 2 diabetes mellitus with diabetic polyneuropathy: Secondary | ICD-10-CM

## 2018-10-27 NOTE — Patient Instructions (Signed)
Please return in 8 weeks for diabetes follow up and blood pressure check.  If you have any questions or concerns, please don't hesitate to send me a message via MyChart or call the office at [phone]. Thank you for visiting with [name] today! It's our pleasure caring for you.

## 2018-10-27 NOTE — Progress Notes (Signed)
Virtual Visit via Video Note  Subjective  CC:  Chief Complaint  Patient presents with  . Hospitalization Follow-up    She was in the hospital 5/1 - 5/3. Reports overall she is feeling well     I connected with Miranda Mathis on 10/27/18 at 10:00 AM EDT by a video enabled telemedicine application and verified that I am speaking with the correct person using two identifiers. Location patient: Home Location provider: [facility] [place], Office Persons participating in the virtual visit: Iesha R Jeter, Leamon Arnt, MD Lilli Light, Sharon discussed the limitations of evaluation and management by telemedicine and the availability of in person appointments. The patient expressed understanding and agreed to proceed. HPI: Miranda Mathis is a 68 y.o. female who was contacted today to address the problems listed above in the chief complaint. Miranda Mathis was hospitalized as noted above for urosepsis, bacteremia with E. coli noted on 2 blood cultures.  Source was thought to be from obstructed kidneys.  She since has been treated with IV antibiotics, has had bilateral renal stents placed and is feeling much better.  She no longer is having any fevers and her pain is much improved.  She is eating and drinking well.  During the visit she also was noted to have acute kidney injury with an elevated creatinine.  Her nephrotoxic drugs including lisinopril Toradol and Mobic had been held.  She has follow-up with urology scheduled in 1 to 2 weeks.  She does need follow-up of her acute kidney injury.  She denies chest pain, shortness of breath, urinary symptoms, lower extremity edema.  I reviewed all of her records, lab testing, and imaging testing. Miranda Mathis A new mild anemia was also noted on lab results.  It was normocytic.  Some of it could have been delusional.  She had a normal hemoglobin in December.  Colon cancer screening is up-to-date.  She does not have chronic kidney disease. . She has no  history of hypertension, she is on an ACE inhibitor for her diabetes.  This is currently being held. . Diabetes: Diet-controlled, diagnosed in 2019.  Most recent A1c last week was 7.0.  This is elevated from her prior testing and in part due to her chronic infection.  Assessment  1. Bacteremia due to Escherichia coli   2. Recurrent kidney stones   3. Acute kidney injury ([place])   4. DM type 2 with diabetic peripheral neuropathy ([place])   5. Normocytic anemia      Plan   Bacteremia due to urinary source: On antibiotics and improving.  She never mounted a white blood count.  Her most recent set of blood cultures were negative.  Recurrent kidney stones with bilateral hydronephrosis status post stenting: Improving.  Has follow-up with urology.  Acute kidney injury due to obstruction: Recheck BMP next week to ensure improving.  Continue holding nephrotoxins.  Diabetes with slightly worsening control.  Recommend restarting diabetic diet.  Will recheck A1c in 8 weeks.  Will start diabetes medication at that time if indicated.  Patient prefers to stay off medications and will work hard on her diet.  Recheck CBC and start work-up for anemia if needed. I discussed the assessment and treatment plan with the patient. The patient was provided an opportunity to ask questions and all were answered. The patient agreed with the plan and demonstrated an understanding of the instructions.   The patient was advised to call back or  seek an in-person evaluation if the symptoms worsen or if the condition fails to improve as anticipated. Follow up: Return in about 8 weeks (around 12/22/2018) for follow up of diabetes and hypertension.  Visit date not found  No orders of the defined types were placed in this encounter.     I reviewed the patients updated PMH, FH, and SocHx.    Patient Active Problem List   Diagnosis Date Noted  . DM type 2 with diabetic peripheral neuropathy ([place]) 09/02/2016    Priority: High   . Recurrent kidney stones 08/29/2015    Priority: High  . Barrett's esophagus with esophagitis 08/29/2015    Priority: High  . GAD (generalized anxiety disorder) 08/29/2015    Priority: High  . Acquired hypothyroidism 11/02/2013    Priority: High  . Mixed hyperlipidemia 04/04/2012    Priority: High  . Fibromyalgia 10/11/2007    Priority: High  . Chronic low back pain 10/11/2007    Priority: High  . Psoriasis 12/02/2017    Priority: Medium  . GERD (gastroesophageal reflux disease) 08/29/2015    Priority: Medium  . Irritable bowel syndrome with constipation 08/29/2015    Priority: Medium  . Spondylosis of lumbar region without myelopathy or radiculopathy 08/29/2015    Priority: Medium  . Primary insomnia 10/25/2014    Priority: Medium  . Seborrheic dermatitis of scalp 12/02/2017    Priority: Low  . Allergic rhinitis 10/11/2007    Priority: Low  . Bilateral hydronephrosis   . E coli bacteremia 10/21/2018  . Degenerative scoliosis 09/07/2018  . Lumbar radiculopathy 09/07/2018   Current Meds  Medication Sig  . acetaminophen (TYLENOL) 500 MG tablet Take 1,000 mg by mouth every 4 (four) hours as needed. pain   . amitriptyline (ELAVIL) 25 MG tablet TAKE 1 TABLET BY MOUTH EVERYDAY AT BEDTIME (Patient taking differently: Take 25 mg by mouth at bedtime. )  . betamethasone dipropionate 0.05 % lotion Apply 1 application topically daily as needed.  . cephALEXin (KEFLEX) 500 MG capsule Take 1 capsule (500 mg total) by mouth 4 (four) times daily for 5 days.  . cholecalciferol (VITAMIN D) 1000 units tablet Take 2,000 Units by mouth daily.  . clotrimazole-betamethasone (LOTRISONE) cream Apply 1 application topically 2 (two) times daily.   Miranda Mathis escitalopram (LEXAPRO) 20 MG tablet TAKE 1 TABLET BY MOUTH EVERY DAY (Patient taking differently: Take 20 mg by mouth daily. )  . gabapentin (NEURONTIN) 300 MG capsule Take 1 capsule (300 mg total) by mouth 3 (three) times daily.  Miranda Mathis levothyroxine  (SYNTHROID, LEVOTHROID) 137 MCG tablet Take 1 tablet (137 mcg total) by mouth daily before breakfast.  . omeprazole (PRILOSEC) 40 MG capsule Take 1 capsule (40 mg total) by mouth daily.  . ondansetron (ZOFRAN ODT) 4 MG disintegrating tablet 4mg  ODT q4 hours prn nausea/vomit (Patient taking differently: Take 4 mg by mouth every 4 (four) hours as needed for nausea or vomiting. 4mg  ODT q4 hours prn nausea/vomit)  . oxybutynin (DITROPAN) 5 MG tablet Take 1 tablet (5 mg total) by mouth 3 (three) times daily.  . pravastatin (PRAVACHOL) 40 MG tablet Take 40 mg by mouth daily.   Miranda Mathis senna-docusate (SENOKOT-S) 8.6-50 MG tablet Take 1-2 tablets by mouth every evening.  . traMADol (ULTRAM) 50 MG tablet Take 1 tablet (50 mg total) by mouth every 6 (six) hours as needed for moderate pain or severe pain.  Miranda Mathis zolpidem (AMBIEN) 10 MG tablet TAKE 1 TABLET BY MOUTH EVERY DAY AT BEDTIME AS NEEDED FOR SLEEP  . [  DISCONTINUED] morphine (MSIR) 15 MG tablet Take 1 tablet (15 mg total) by mouth every 4 (four) hours as needed for severe pain.  . [DISCONTINUED] morphine (MSIR) 15 MG tablet Take 1 tablet (15 mg total) by mouth every 4 (four) hours as needed for severe pain.    Allergies: Patient is allergic to morphine. Family History: Patient family history includes Arthritis in her brother, daughter, father, maternal grandmother, and mother; Asthma in her daughter; Birth defects in her daughter and father; COPD in her mother; Cancer in her father; Coronary artery disease in her paternal grandfather; Depression in her daughter; Diabetes in her paternal aunt; Emphysema in her mother; Hearing loss in her daughter; Hyperlipidemia in her brother; Hypertension in her father and mother; Kidney Stones in her daughter; Mental retardation in her daughter; Osteoarthritis in her mother; Prostate cancer in her father; Rheum arthritis in her mother. Social History:  Patient  reports that she has never smoked. She has never used smokeless  tobacco. She reports current alcohol use of about 1.0 standard drinks of alcohol per week. She reports that she does not use drugs.  Review of Systems: Constitutional: Negative for fever malaise or anorexia Cardiovascular: negative for chest pain Respiratory: negative for SOB or persistent cough Gastrointestinal: negative for abdominal pain  OBJECTIVE Vitals: Ht 5\' 2"  (1.575 m)   Wt 210 lb (95.3 kg)   BMI 38.41 kg/m  General: no acute distress , A&Ox3, appears much better.  Appears comfortable BP Readings from Last 3 Encounters:  10/23/18 (!) 141/78  10/18/18 132/68  09/16/18 128/84   Lab Results  Component Value Date   CREATININE 1.25 (H) 10/23/2018   BUN 16 10/23/2018   NA 141 10/23/2018   K 4.3 10/23/2018   CL 109 10/23/2018   CO2 23 10/23/2018   Lab Results  Component Value Date   WBC 5.3 10/23/2018   HGB 10.8 (L) 10/23/2018   HCT 33.3 (L) 10/23/2018   MCV 89.0 10/23/2018   PLT 168 10/23/2018    Leamon Arnt, MD

## 2018-10-31 ENCOUNTER — Other Ambulatory Visit (INDEPENDENT_AMBULATORY_CARE_PROVIDER_SITE_OTHER): Payer: Medicare Other

## 2018-10-31 ENCOUNTER — Other Ambulatory Visit: Payer: Self-pay

## 2018-10-31 ENCOUNTER — Ambulatory Visit: Payer: Medicare Other | Admitting: Family Medicine

## 2018-10-31 VITALS — BP 118/76

## 2018-10-31 DIAGNOSIS — N179 Acute kidney failure, unspecified: Secondary | ICD-10-CM

## 2018-10-31 DIAGNOSIS — E1142 Type 2 diabetes mellitus with diabetic polyneuropathy: Secondary | ICD-10-CM | POA: Diagnosis not present

## 2018-10-31 DIAGNOSIS — R7881 Bacteremia: Secondary | ICD-10-CM | POA: Diagnosis not present

## 2018-10-31 DIAGNOSIS — N200 Calculus of kidney: Secondary | ICD-10-CM | POA: Diagnosis not present

## 2018-10-31 DIAGNOSIS — B9620 Unspecified Escherichia coli [E. coli] as the cause of diseases classified elsewhere: Secondary | ICD-10-CM

## 2018-10-31 DIAGNOSIS — D649 Anemia, unspecified: Secondary | ICD-10-CM

## 2018-10-31 LAB — CBC WITH DIFFERENTIAL/PLATELET
Basophils Absolute: 0.1 10*3/uL (ref 0.0–0.1)
Basophils Relative: 1.2 % (ref 0.0–3.0)
Eosinophils Absolute: 0.2 10*3/uL (ref 0.0–0.7)
Eosinophils Relative: 3.2 % (ref 0.0–5.0)
HCT: 39.5 % (ref 36.0–46.0)
Hemoglobin: 13 g/dL (ref 12.0–15.0)
Lymphocytes Relative: 21.5 % (ref 12.0–46.0)
Lymphs Abs: 1.5 10*3/uL (ref 0.7–4.0)
MCHC: 33 g/dL (ref 30.0–36.0)
MCV: 87.5 fl (ref 78.0–100.0)
Monocytes Absolute: 0.3 10*3/uL (ref 0.1–1.0)
Monocytes Relative: 4.7 % (ref 3.0–12.0)
Neutro Abs: 4.8 10*3/uL (ref 1.4–7.7)
Neutrophils Relative %: 69.4 % (ref 43.0–77.0)
Platelets: 248 10*3/uL (ref 150.0–400.0)
RBC: 4.52 Mil/uL (ref 3.87–5.11)
RDW: 15.4 % (ref 11.5–15.5)
WBC: 7 10*3/uL (ref 4.0–10.5)

## 2018-10-31 LAB — COMPREHENSIVE METABOLIC PANEL
ALT: 39 U/L — ABNORMAL HIGH (ref 0–35)
AST: 50 U/L — ABNORMAL HIGH (ref 0–37)
Albumin: 3.9 g/dL (ref 3.5–5.2)
Alkaline Phosphatase: 81 U/L (ref 39–117)
BUN: 16 mg/dL (ref 6–23)
CO2: 26 mEq/L (ref 19–32)
Calcium: 9.1 mg/dL (ref 8.4–10.5)
Chloride: 104 mEq/L (ref 96–112)
Creatinine, Ser: 1.22 mg/dL — ABNORMAL HIGH (ref 0.40–1.20)
GFR: 43.77 mL/min — ABNORMAL LOW (ref >60.00)
Glucose, Bld: 158 mg/dL — ABNORMAL HIGH (ref 70–99)
Potassium: 4.4 mEq/L (ref 3.5–5.1)
Sodium: 140 mEq/L (ref 135–145)
Total Bilirubin: 0.4 mg/dL (ref 0.2–1.2)
Total Protein: 6.9 g/dL (ref 6.0–8.3)

## 2018-10-31 NOTE — Progress Notes (Signed)
Miranda Mathis is a 68 y.o. female presents to the office today for Blood pressure check.  Blood pressure medication: None Blood pressure was taken in the right arm after patient rested for 7 minutes.    [name]

## 2018-11-01 ENCOUNTER — Other Ambulatory Visit: Payer: Self-pay | Admitting: *Deleted

## 2018-11-01 DIAGNOSIS — N179 Acute kidney failure, unspecified: Secondary | ICD-10-CM

## 2018-11-01 DIAGNOSIS — N200 Calculus of kidney: Secondary | ICD-10-CM

## 2018-11-01 DIAGNOSIS — D649 Anemia, unspecified: Secondary | ICD-10-CM

## 2018-11-01 NOTE — Progress Notes (Signed)
Please call patient: I have reviewed his/her lab results. Kidney function is improving and BP was normal. Recommend continuing to Markov lisinopril, toradol, and mobic and recheck CMP in 2 weeks, lab visit only.  Liver tests were mildly elevated: will repeat in 2 weeks as well. No other changes recommended at this time. Anemia has resolved (likely was dilutional).

## 2018-11-02 LAB — IRON,TIBC AND FERRITIN PANEL
%SAT: 18 % (calc) (ref 16–45)
Ferritin: 112 ng/mL (ref 16–288)
Iron: 63 ug/dL (ref 45–160)
TIBC: 350 mcg/dL (calc) (ref 250–450)

## 2018-11-04 DIAGNOSIS — N131 Hydronephrosis with ureteral stricture, not elsewhere classified: Secondary | ICD-10-CM | POA: Diagnosis not present

## 2018-11-10 DIAGNOSIS — R310 Gross hematuria: Secondary | ICD-10-CM | POA: Diagnosis not present

## 2018-11-14 ENCOUNTER — Other Ambulatory Visit: Payer: Self-pay | Admitting: Family Medicine

## 2018-11-15 ENCOUNTER — Other Ambulatory Visit: Payer: Self-pay

## 2018-11-15 ENCOUNTER — Other Ambulatory Visit (INDEPENDENT_AMBULATORY_CARE_PROVIDER_SITE_OTHER): Payer: Medicare Other

## 2018-11-15 DIAGNOSIS — D649 Anemia, unspecified: Secondary | ICD-10-CM | POA: Diagnosis not present

## 2018-11-15 DIAGNOSIS — N179 Acute kidney failure, unspecified: Secondary | ICD-10-CM | POA: Diagnosis not present

## 2018-11-15 DIAGNOSIS — N200 Calculus of kidney: Secondary | ICD-10-CM

## 2018-11-15 LAB — COMPREHENSIVE METABOLIC PANEL
ALT: 36 U/L — ABNORMAL HIGH (ref 0–35)
AST: 35 U/L (ref 0–37)
Albumin: 3.7 g/dL (ref 3.5–5.2)
Alkaline Phosphatase: 85 U/L (ref 39–117)
BUN: 14 mg/dL (ref 6–23)
CO2: 26 mEq/L (ref 19–32)
Calcium: 9 mg/dL (ref 8.4–10.5)
Chloride: 104 mEq/L (ref 96–112)
Creatinine, Ser: 1.17 mg/dL (ref 0.40–1.20)
GFR: 45.94 mL/min — ABNORMAL LOW (ref >60.00)
Glucose, Bld: 167 mg/dL — ABNORMAL HIGH (ref 70–99)
Potassium: 4 mEq/L (ref 3.5–5.1)
Sodium: 140 mEq/L (ref 135–145)
Total Bilirubin: 0.3 mg/dL (ref 0.2–1.2)
Total Protein: 6.8 g/dL (ref 6.0–8.3)

## 2018-11-16 NOTE — Progress Notes (Signed)
Please call patient: I have reviewed his/her lab results. everything has normalized. She may restart her lisinopril. May use mobic sparingly.

## 2018-11-17 ENCOUNTER — Other Ambulatory Visit: Payer: Self-pay | Admitting: Urology

## 2018-11-24 ENCOUNTER — Other Ambulatory Visit: Payer: Self-pay

## 2018-11-24 ENCOUNTER — Encounter (HOSPITAL_BASED_OUTPATIENT_CLINIC_OR_DEPARTMENT_OTHER): Payer: Self-pay | Admitting: *Deleted

## 2018-11-24 NOTE — Progress Notes (Signed)
Spoke w/ pt via phone for pre-op interview.  Npo after mn w/ exception clear liquids until 0800.  Needs istat.  Current ekg in chart and epic.  Will take take am meds dos w/ sips of water, with exception no lisinopril.  Getting covid test done Friday 11-25-2018 @ 1020.

## 2018-11-24 NOTE — Progress Notes (Signed)

## 2018-11-25 ENCOUNTER — Other Ambulatory Visit (HOSPITAL_COMMUNITY)
Admission: RE | Admit: 2018-11-25 | Discharge: 2018-11-25 | Disposition: A | Discharge status: Home / Self Care | Payer: Medicare Other | Source: Ambulatory Visit | Attending: Urology | Admitting: Urology

## 2018-11-25 DIAGNOSIS — Z1159 Encounter for screening for other viral diseases: Secondary | ICD-10-CM | POA: Insufficient documentation

## 2018-11-25 DIAGNOSIS — Z01812 Encounter for preprocedural laboratory examination: Secondary | ICD-10-CM | POA: Insufficient documentation

## 2018-11-26 LAB — NOVEL CORONAVIRUS, NAA (HOSP ORDER, SEND-OUT TO REF LAB; TAT 18-24 HRS): SARS-CoV-2, NAA: NOT DETECTED

## 2018-11-28 NOTE — H&P (Signed)
Telehealth Visit     11/04/2018   --------------------------------------------------------------------------------   Miranda Borer. Mathis  MRN: [id number]  PRIMARY CARE:  [place] Jonni Sanger, MD  DOB: [birth year], 68 year old Female  REFERRING:  Quintella Reichert, MD  SSN: [id number]  PROVIDER:  Festus Aloe, M.D.    LOCATION:  [facility] [phone]   --------------------------------------------------------------------------------   CC: I have hydronephrosis.  HPI: Miranda Mathis is a 68 year-old female established patient who is here for hydronephrosis.  Her problem was diagnosed approximately 02/21/2011. The problem is on both sides. She had the following x-rays done: Renal Ultrasound and CT Scan.   She is currently having flank pain. She denies having back pain, groin pain, nausea, vomiting, fever, and chills.   -a 2012 CT showed moderate right hydro down to the distal ureter, with no definite stone.  -Sep 2012 bilateral URS Sept 2012 with brush biopsy and stent placement for right distal ureteral stricture and some subtle mucosal enhancement of left distal ureter. She also had a small stone in the bladder. I could not get access up to the right stricture, therefore blindly brushed and then stented the right. A small stone was found in the bladder which possibly passed from right ureter as no stone was seen in the bladder on her Aug 2012 CT.  Results Sept 21, 2012:  Urine cytology - no malignant cells, few wbc's, few rbc's  Bladder stone - Calcium phosphate, CaOxalate  Left ureteral brush - rare atypical cells, chronic inflammation = favor reactive atypia  Left ureteral biopsy - insufficient material  Right ureteral brush - limited material, reactive atypia favored, cant exclude LG neoplasm   -Apr 17, 2011. The left stent was removed. Right URS revealed a narrow ureter with no obvious tumor. It was brushed and biopsy done. Despite a 6Fr stent left in place prior I could not get scope  past the stricture. An 8Fr stent was placed on the right.  Results Apr 17, 2011:  -right ureter brush - atypia favor reactive  -right ureter biopsy - fibromuscular stroma with inflammation, necrosis, granulation (c/w ulcer) - no malignancy   -Jan 2013 - cysto, balloon dilation right ureteral stricture Jul 10, 2011. The stent was removed Aug 10, 2011.   A September 2013 ultrasound showed minimal right hydronephrosis and a postvoid of 9 mL.   She presented May 2017 presented with right flank pain. CT again Rt hydro down to distal ureteral sx (similar to 2012). Bun 19, cr 0.9. A Mag-3 Lasix scan -- Rt 42% fxn, Lt 58% fxn, t1/2 Rt 6 min, Lt 3 min.   She had flank pain again Jun 2017. U/S - right hydro and dilated ureter similar to prior - presented with flank pain.   October 2017 repeat MAG3 Lasix renogram showed right function 44% with washout and 5.7 minutes, left function 56% with washout and 4.4 minutes. Repeat ultrasounds showed resolution of the right hydronephrosis and nl PVR (zero).   She had severe pain Mar 24, 2017 and CT showed now possible rt UPJ although the proximal ureter looked dilated. She was taken for Rt RGP which appeared like a right UPJ and the right distal ureter narrow but passable with semi-rigid. I couldn't get a flexible proximal so stent left and repeat URS showed a patent but narrow UPJ. Brush biopsy benign, cytology collected but not sent (lab did not receive). Stent replaced. We removed the stents in office and she did well.   She presented again  Mar 2020 and Apr 2020 with left flank and RLQ pain. CT showed L > R hydro Mar 2020. She developed fever and positive blood cx's for e coli. Repeat CT 10/18/2018 again revealed L > R hydro without stone or mass and transition at the left iliacs. She was stented bilaterally 10/22/2018. She has done well. Occasional frequency. No fver, dysuria or hematuria.     ALLERGIES: Morphine Derivatives - Itching    MEDICATIONS: Ketorolac  Tromethamine 10 mg tablet 1 tablet PO Q 6 H PRN  Levothyroxine Sodium 112 mcg tablet 1 tablet PO Daily  Lisinopril  Oxybutynin Chloride  Tamsulosin Hcl 0.4 mg capsule  Betamethasone Diprop Augmented 0.05 % lotion  Esomeprazole Magnesium  Gabapentin  Ketoconazole 2 % shampoo  Lexapro  Meloxicam 15 mg tablet Oral  Pravastatin Sodium 20 mg tablet Oral  Senna-Docusate Sodium  Triamcinolone Acetonide 0.1 % lotion  Tylenol  Vitamin D3 50 mcg (2,000 unit) tablet  Zofran 4 mg tablet 1 tablet PO Q 8 H PRN  Zolpidem Tartrate 10 mg tablet Oral     GU PSH: Cysto Dilate Ureteral Stricture - 2013 Cysto Remove Stent FB Sim - 04/27/2017 Cysto Uretero Biopsy Fulgura, Right - 04/09/2017, 2012, 2012 Cystoscopy Insert Stent, Bilateral - 10/22/2018, Right - 03/25/2017, 2012, 2012 Cystoscopy Ureteroscopy, Right - 03/25/2017 Hysterectomy Unilat SO - 2012      PSH Notes: Cystourethroscopy With Treatment Of Ureteral Stricture, Cystoscopy With Ureteroscopy For Biopsy Right, Cystoscopy With Insertion Of Ureteral Stent Right, Cystoscopy With Insertion Of Ureteral Stent Bilateral, Cystoscopy With Ureteroscopy For Biopsy Bilateral, Cholecystectomy, Hysterectomy, Foot Surgery, Cesarean Section   NON-GU PSH: Cesarean Delivery Only - 2012 Cholecystectomy (open) - 2012    GU PMH: Flank Pain - 08/23/2018 Ureteral obstruction - 08/23/2018, - 04/27/2017, - 12/21/2016, - 06/17/2016, - 2017 Hydronephrosis Unspec - 2017, Hydronephrosis, - 2017 Microscopic hematuria - 2017 RUQ pain - 2017 History of urolithiasis, History of renal calculi - 2017 Other microscopic hematuria, Microscopic hematuria - 2017 Ureteral stricture, Ureteral stricture - 2017 Bladder Stone, Bladder calculus - 2014 Urinary Tract Inf, Unspec site, Pyuria - 2014      PMH Notes:  2011-02-18 13:32:21 - Note: Arthritis   NON-GU PMH: Pyuria/other UA findings - 08/23/2018 Encounter for general adult medical examination without abnormal findings, Encounter  for preventive health examination - 2017 Personal history of other endocrine, nutritional and metabolic disease, History of hypercholesterolemia - 2017, History of hyperthyroidism, - 2017 Personal history of other diseases of the digestive system, History of esophageal reflux - 2014    FAMILY HISTORY: Chronic Obstructive Pulmonary Disease - Mother Congestive Heart Failure - Mother, Brother Death In The Family Father - Runs In Family Death In The Family Mother - Runs In Family Family Health Status Number - Runs In Family Kidney Stones - Runs In Family Prostate Cancer - Father   SOCIAL HISTORY: Marital Status: Married Preferred Language: English; Ethnicity: Not Hispanic Or Latino; Race: White Current Smoking Status: Patient has never smoked.  Drinks 4+ caffeinated drinks per day. Patient's occupation is/was retired.    REVIEW OF SYSTEMS:    GU Review Female:   Patient reports frequent urination. Patient denies hard to postpone urination, burning /pain with urination, get up at night to urinate, leakage of urine, stream starts and stops, trouble starting your stream, have to strain to urinate, and being pregnant.  Gastrointestinal (Upper):   Patient denies nausea, vomiting, and indigestion/ heartburn.  Gastrointestinal (Lower):   Patient reports constipation. Patient denies diarrhea.  Constitutional:  Patient reports fatigue. Patient denies fever, night sweats, and weight loss.  Skin:   Patient denies skin rash/ lesion and itching.  Eyes:   Patient denies blurred vision and double vision.  Ears/ Nose/ Throat:   Patient denies sore throat and sinus problems.  Hematologic/Lymphatic:   Patient denies easy bruising and swollen glands.  Cardiovascular:   Patient denies leg swelling and chest pains.  Respiratory:   Patient denies cough and shortness of breath.  Endocrine:   Patient denies excessive thirst.  Musculoskeletal:   Patient denies back pain and joint pain.  Neurological:   Patient  denies headaches and dizziness.  Psychologic:   Patient denies depression and anxiety.   PAST DATA REVIEWED:  Source Of History:  Patient  X-Ray Review: Retrograde Pyelogram: Reviewed Films. Discussed With Patient. 10/2018 - screen shared  C.T. Abdomen/Pelvis: Reviewed Films. Discussed With Patient. 08/2018 and 09/2018 -- screen shared     PROCEDURES:          Teleheatlh This patient encounter is appropriate and reasonable under the circumstances given the patient's particular presentation at this time. The patient has been advised of the potential risks and limitations of this mode of treatment (including, but not limited to, the absence of in-person examination) and has agreed to be treated in a remote fashion in spite of them.   Any and all of the patient's/patient's family's questions on this issue have been answered, and I have made no promises or guarantees to the patient. The patient has also been advised to contact this office for worsening conditions or problems, and seek emergency medical treatment and/or call 911 if the patient deems either necessary.    ASSESSMENT:      ICD-10 Details  1 GU:   Ureteral obstruction - N13.1      PLAN:            Medications New Meds: Cephalexin 500 mg capsule 1 capsule PO Q HS   #30  0 Refill(s)            Schedule Labs: 1 Week - Urinalysis w/Scope    1 Week - Urine Culture  Return Visit/Planned Activity: Next Available Appointment - Schedule Surgery          Document Letter(s):  Created for Patient: Clinical Summary         Notes:   She completed abx. I will send nightly cephalexin. We discussed the nature r/b/a to bilateral RGP,/URS, poss balloon dilation and biopsy, stent exchange. Pre-op u/a and cx in office ordered. All questions answred. She elects to proceed.   She's done well with the stents and feels better. I surmise she has some type of inflammatory obstruction. There was no sign of RPF on CT's.   cc: Dr. Jonni Sanger          Next Appointment:      Next Appointment: 11/29/2018 01:15 PM    Appointment Type: Surgery     Location: [facility] [phone]    Provider: Festus Aloe, M.D.    Reason for Visit: NE/OP CYSTO BL URS, POSS BALLOON DIL, BIOPSY STENT      * Signed by Festus Aloe, M.D. on 11/28/18 at 4:28 PM (EDT)*     The information contained in this medical record document is considered private and confidential patient information. This information can only be used for the medical diagnosis and/or medical services that are being provided by the patient's selected caregivers. This information can only be distributed outside of  the patient's care if the patient agrees and signs waivers of authorization for this information to be sent to an outside source or route.  Addendum: UA 5/21 with neg nitrite, 3-10 rbc, 0-5 wbc, few bacteria. Urine cx no growth.

## 2018-11-28 NOTE — Progress Notes (Signed)
SPOKE W/  Patient- Miranda Mathis     SCREENING SYMPTOMS OF COVID 19:   COUGH--No  RUNNY NOSE--- No  SORE THROAT---No  NASAL CONGESTION----No  SNEEZING----No  SHORTNESS OF BREATH---No  DIFFICULTY BREATHING--- No  TEMP >100.0 -----No  UNEXPLAINED BODY ACHES------No  CHILLS -------- No  HEADACHES ---------No  LOSS OF SMELL/ TASTE --------No    HAVE YOU OR ANY FAMILY MEMBER TRAVELLED PAST 14 DAYS OUT OF THE   COUNTY---No STATE----No COUNTRY----No  HAVE YOU OR ANY FAMILY MEMBER BEEN EXPOSED TO ANYONE WITH COVID 19? No  Provided patient with negative COVID test results. Screening completed.  Lyndel Pleasure, RN

## 2018-11-28 NOTE — Progress Notes (Signed)
Called patient to complete pre-op COVID-19 screening. No answer, no voicemail.

## 2018-11-29 ENCOUNTER — Ambulatory Visit (HOSPITAL_BASED_OUTPATIENT_CLINIC_OR_DEPARTMENT_OTHER): Payer: Medicare Other | Admitting: Anesthesiology

## 2018-11-29 ENCOUNTER — Encounter (HOSPITAL_BASED_OUTPATIENT_CLINIC_OR_DEPARTMENT_OTHER)
Admission: RE | Disposition: A | Discharge status: Home / Self Care | Payer: Self-pay | Source: Home / Self Care | Attending: Urology

## 2018-11-29 ENCOUNTER — Encounter (HOSPITAL_BASED_OUTPATIENT_CLINIC_OR_DEPARTMENT_OTHER): Payer: Self-pay | Admitting: Anesthesiology

## 2018-11-29 ENCOUNTER — Ambulatory Visit (HOSPITAL_BASED_OUTPATIENT_CLINIC_OR_DEPARTMENT_OTHER)
Admission: RE | Admit: 2018-11-29 | Discharge: 2018-11-29 | Disposition: A | Discharge status: Home / Self Care | Payer: Medicare Other | Attending: Urology | Admitting: Urology

## 2018-11-29 DIAGNOSIS — E785 Hyperlipidemia, unspecified: Secondary | ICD-10-CM | POA: Diagnosis not present

## 2018-11-29 DIAGNOSIS — E7800 Pure hypercholesterolemia, unspecified: Secondary | ICD-10-CM | POA: Insufficient documentation

## 2018-11-29 DIAGNOSIS — Z87442 Personal history of urinary calculi: Secondary | ICD-10-CM | POA: Diagnosis not present

## 2018-11-29 DIAGNOSIS — N2889 Other specified disorders of kidney and ureter: Secondary | ICD-10-CM | POA: Diagnosis not present

## 2018-11-29 DIAGNOSIS — E119 Type 2 diabetes mellitus without complications: Secondary | ICD-10-CM | POA: Insufficient documentation

## 2018-11-29 DIAGNOSIS — F419 Anxiety disorder, unspecified: Secondary | ICD-10-CM | POA: Diagnosis not present

## 2018-11-29 DIAGNOSIS — Z79899 Other long term (current) drug therapy: Secondary | ICD-10-CM | POA: Insufficient documentation

## 2018-11-29 DIAGNOSIS — E1140 Type 2 diabetes mellitus with diabetic neuropathy, unspecified: Secondary | ICD-10-CM | POA: Diagnosis not present

## 2018-11-29 DIAGNOSIS — M797 Fibromyalgia: Secondary | ICD-10-CM | POA: Diagnosis not present

## 2018-11-29 DIAGNOSIS — R896 Abnormal cytological findings in specimens from other organs, systems and tissues: Secondary | ICD-10-CM | POA: Diagnosis not present

## 2018-11-29 DIAGNOSIS — Z79890 Hormone replacement therapy (postmenopausal): Secondary | ICD-10-CM | POA: Diagnosis not present

## 2018-11-29 DIAGNOSIS — Z466 Encounter for fitting and adjustment of urinary device: Secondary | ICD-10-CM | POA: Diagnosis not present

## 2018-11-29 DIAGNOSIS — N1330 Unspecified hydronephrosis: Secondary | ICD-10-CM | POA: Diagnosis not present

## 2018-11-29 DIAGNOSIS — I10 Essential (primary) hypertension: Secondary | ICD-10-CM | POA: Insufficient documentation

## 2018-11-29 DIAGNOSIS — M1990 Unspecified osteoarthritis, unspecified site: Secondary | ICD-10-CM | POA: Diagnosis not present

## 2018-11-29 DIAGNOSIS — E039 Hypothyroidism, unspecified: Secondary | ICD-10-CM | POA: Insufficient documentation

## 2018-11-29 DIAGNOSIS — R898 Other abnormal findings in specimens from other organs, systems and tissues: Secondary | ICD-10-CM | POA: Diagnosis not present

## 2018-11-29 DIAGNOSIS — K219 Gastro-esophageal reflux disease without esophagitis: Secondary | ICD-10-CM | POA: Insufficient documentation

## 2018-11-29 DIAGNOSIS — F329 Major depressive disorder, single episode, unspecified: Secondary | ICD-10-CM | POA: Diagnosis not present

## 2018-11-29 DIAGNOSIS — N131 Hydronephrosis with ureteral stricture, not elsewhere classified: Secondary | ICD-10-CM | POA: Insufficient documentation

## 2018-11-29 DIAGNOSIS — Z791 Long term (current) use of non-steroidal anti-inflammatories (NSAID): Secondary | ICD-10-CM | POA: Diagnosis not present

## 2018-11-29 HISTORY — DX: Other forms of scoliosis, site unspecified: M41.80

## 2018-11-29 HISTORY — DX: Hypothyroidism, unspecified: E03.9

## 2018-11-29 HISTORY — DX: Other specified disorders of bone density and structure, unspecified site: M85.80

## 2018-11-29 HISTORY — PX: CYSTOSCOPY WITH URETEROSCOPY AND STENT PLACEMENT: SHX6377

## 2018-11-29 HISTORY — DX: Generalized anxiety disorder: F41.1

## 2018-11-29 HISTORY — DX: Other specified disorders of bladder: N32.89

## 2018-11-29 HISTORY — DX: Polyneuropathy, unspecified: G62.9

## 2018-11-29 HISTORY — DX: Personal history of colonic polyps: Z86.010

## 2018-11-29 HISTORY — DX: Spondylolysis, lumbar region: M43.06

## 2018-11-29 HISTORY — DX: Personal history of other infectious and parasitic diseases: Z86.19

## 2018-11-29 HISTORY — DX: Presence of spectacles and contact lenses: Z97.3

## 2018-11-29 HISTORY — DX: Seborrheic dermatitis, unspecified: L21.9

## 2018-11-29 HISTORY — DX: Unspecified hydronephrosis: N13.30

## 2018-11-29 HISTORY — DX: Personal history of colon polyps, unspecified: Z86.0100

## 2018-11-29 HISTORY — DX: Psoriasis, unspecified: L40.9

## 2018-11-29 HISTORY — DX: Personal history of urinary calculi: Z87.442

## 2018-11-29 HISTORY — DX: Personal history of other diseases of urinary system: Z87.448

## 2018-11-29 HISTORY — DX: Personal history of urinary (tract) infections: Z87.440

## 2018-11-29 HISTORY — DX: Allergic rhinitis, unspecified: J30.9

## 2018-11-29 HISTORY — DX: Essential (primary) hypertension: I10

## 2018-11-29 HISTORY — DX: Complete loss of teeth, unspecified cause, unspecified class: K08.109

## 2018-11-29 HISTORY — DX: Other secondary scoliosis, site unspecified: M41.50

## 2018-11-29 LAB — POCT I-STAT 4, (NA,K, GLUC, HGB,HCT)
Glucose, Bld: 129 mg/dL — ABNORMAL HIGH (ref 70–99)
HCT: 37 % (ref 36.0–46.0)
Hemoglobin: 12.6 g/dL (ref 12.0–15.0)
Potassium: 4.1 mmol/L (ref 3.5–5.1)
Sodium: 140 mmol/L (ref 135–145)

## 2018-11-29 SURGERY — CYSTOURETEROSCOPY, WITH STENT INSERTION
Anesthesia: Monitor Anesthesia Care | Site: Ureter | Laterality: Bilateral

## 2018-11-29 MED ORDER — DEXAMETHASONE SODIUM PHOSPHATE 10 MG/ML IJ SOLN
INTRAMUSCULAR | Status: DC | PRN
  Administered 2018-11-29: 5 mg via INTRAVENOUS

## 2018-11-29 MED ORDER — FENTANYL CITRATE (PF) 100 MCG/2ML IJ SOLN
INTRAMUSCULAR | Status: DC | PRN
  Administered 2018-11-29 (×3): 25 ug via INTRAVENOUS
  Administered 2018-11-29 (×2): 50 ug via INTRAVENOUS
  Administered 2018-11-29: 25 ug via INTRAVENOUS

## 2018-11-29 MED ORDER — PROMETHAZINE HCL 25 MG/ML IJ SOLN
6.2500 mg | INTRAMUSCULAR | Status: DC | PRN
  Filled 2018-11-29: qty 1

## 2018-11-29 MED ORDER — PROPOFOL 10 MG/ML IV BOLUS
INTRAVENOUS | Status: DC | PRN
  Administered 2018-11-29: 40 mg via INTRAVENOUS

## 2018-11-29 MED ORDER — FENTANYL CITRATE (PF) 100 MCG/2ML IJ SOLN
INTRAMUSCULAR | Status: AC
  Filled 2018-11-29: qty 2

## 2018-11-29 MED ORDER — SODIUM CHLORIDE 0.9 % IV SOLN
2.0000 g | Freq: Once | INTRAVENOUS | Status: AC
  Administered 2018-11-29: 2 g via INTRAVENOUS
  Filled 2018-11-29: qty 20

## 2018-11-29 MED ORDER — LIDOCAINE HCL URETHRAL/MUCOSAL 2 % EX GEL
CUTANEOUS | Status: DC | PRN
  Administered 2018-11-29: 1

## 2018-11-29 MED ORDER — MIDAZOLAM HCL 2 MG/2ML IJ SOLN
INTRAMUSCULAR | Status: DC | PRN
  Administered 2018-11-29 (×2): 1 mg via INTRAVENOUS

## 2018-11-29 MED ORDER — LIDOCAINE 2% (20 MG/ML) 5 ML SYRINGE
INTRAMUSCULAR | Status: DC | PRN
  Administered 2018-11-29: 50 mg via INTRAVENOUS

## 2018-11-29 MED ORDER — SODIUM CHLORIDE 0.9 % IR SOLN
Status: DC | PRN
  Administered 2018-11-29: 3000 mL via INTRAVESICAL

## 2018-11-29 MED ORDER — OXYCODONE HCL 5 MG/5ML PO SOLN
5.0000 mg | Freq: Once | ORAL | Status: AC | PRN
  Filled 2018-11-29: qty 5

## 2018-11-29 MED ORDER — NITROFURANTOIN MONOHYD MACRO 100 MG PO CAPS: 100.0000 mg | ORAL_CAPSULE | Freq: Every day | ORAL | 0 refills | Status: DC

## 2018-11-29 MED ORDER — OXYCODONE HCL 5 MG PO TABS
5.0000 mg | ORAL_TABLET | Freq: Once | ORAL | Status: AC | PRN
  Administered 2018-11-29: 5 mg via ORAL
  Filled 2018-11-29: qty 1

## 2018-11-29 MED ORDER — OXYBUTYNIN CHLORIDE 5 MG PO TABS: 5.0000 mg | ORAL_TABLET | Freq: Three times a day (TID) | ORAL | 3 refills | Status: DC | PRN

## 2018-11-29 MED ORDER — KETOROLAC TROMETHAMINE 30 MG/ML IJ SOLN
INTRAMUSCULAR | Status: DC | PRN
  Administered 2018-11-29: 30 mg via INTRAVENOUS

## 2018-11-29 MED ORDER — SODIUM CHLORIDE 0.9 % IV SOLN
INTRAVENOUS | Status: DC
  Administered 2018-11-29: 13:00:00 via INTRAVENOUS
  Filled 2018-11-29: qty 1000

## 2018-11-29 MED ORDER — MEPERIDINE HCL 25 MG/ML IJ SOLN
6.2500 mg | INTRAMUSCULAR | Status: DC | PRN
  Filled 2018-11-29: qty 1

## 2018-11-29 MED ORDER — ACETAMINOPHEN 160 MG/5ML PO SOLN
325.0000 mg | Freq: Once | ORAL | Status: DC | PRN
  Filled 2018-11-29: qty 20.3

## 2018-11-29 MED ORDER — ACETAMINOPHEN 10 MG/ML IV SOLN
1000.0000 mg | Freq: Once | INTRAVENOUS | Status: DC | PRN
  Filled 2018-11-29: qty 100

## 2018-11-29 MED ORDER — PROPOFOL 10 MG/ML IV BOLUS
INTRAVENOUS | Status: AC
  Filled 2018-11-29: qty 20

## 2018-11-29 MED ORDER — FENTANYL CITRATE (PF) 100 MCG/2ML IJ SOLN
25.0000 ug | INTRAMUSCULAR | Status: DC | PRN
  Administered 2018-11-29 (×2): 50 ug via INTRAVENOUS
  Filled 2018-11-29: qty 1

## 2018-11-29 MED ORDER — ONDANSETRON HCL 4 MG/2ML IJ SOLN
INTRAMUSCULAR | Status: DC | PRN
  Administered 2018-11-29: 4 mg via INTRAVENOUS

## 2018-11-29 MED ORDER — LACTATED RINGERS IV SOLN
INTRAVENOUS | Status: DC
  Filled 2018-11-29: qty 1000

## 2018-11-29 MED ORDER — PROPOFOL 500 MG/50ML IV EMUL
INTRAVENOUS | Status: DC | PRN
  Administered 2018-11-29: 100 ug/kg/min via INTRAVENOUS

## 2018-11-29 MED ORDER — SODIUM CHLORIDE 0.9 % IV SOLN
INTRAVENOUS | Status: AC
  Filled 2018-11-29: qty 100

## 2018-11-29 MED ORDER — CEFTRIAXONE SODIUM 2 G IJ SOLR
INTRAMUSCULAR | Status: AC
  Filled 2018-11-29: qty 20

## 2018-11-29 MED ORDER — ACETAMINOPHEN 325 MG PO TABS
325.0000 mg | ORAL_TABLET | Freq: Once | ORAL | Status: DC | PRN
  Filled 2018-11-29: qty 2

## 2018-11-29 MED ORDER — MIDAZOLAM HCL 2 MG/2ML IJ SOLN
INTRAMUSCULAR | Status: AC
  Filled 2018-11-29: qty 2

## 2018-11-29 MED ORDER — OXYCODONE HCL 5 MG PO TABS
ORAL_TABLET | ORAL | Status: AC
  Filled 2018-11-29: qty 1

## 2018-11-29 SURGICAL SUPPLY — 22 items
BAG DRAIN URO-CYSTO SKYTR STRL (DRAIN) ×2 IMPLANT
BAG DRN UROCATH (DRAIN) ×1
BRUSH URET BIOPSY 3F (UROLOGICAL SUPPLIES) ×1 IMPLANT
CATH URET 5FR 28IN CONE TIP (BALLOONS)
CATH URET 5FR 28IN OPEN ENDED (CATHETERS) ×1 IMPLANT
CATH URET 5FR 70CM CONE TIP (BALLOONS) IMPLANT
CATH URET DUAL LUMEN 6-10FR 50 (CATHETERS) IMPLANT
CLOTH BEACON ORANGE TIMEOUT ST (SAFETY) ×1 IMPLANT
GLOVE BIO SURGEON STRL SZ7.5 (GLOVE) ×2 IMPLANT
GOWN STRL REUS W/TWL LRG LVL3 (GOWN DISPOSABLE) ×2 IMPLANT
GOWN STRL REUS W/TWL XL LVL3 (GOWN DISPOSABLE) ×2 IMPLANT
GUIDEWIRE ANG ZIPWIRE 038X150 (WIRE) ×1 IMPLANT
GUIDEWIRE STR DUAL SENSOR (WIRE) ×2 IMPLANT
IV NS IRRIG 3000ML ARTHROMATIC (IV SOLUTION) ×3 IMPLANT
KIT TURNOVER CYSTO (KITS) ×2 IMPLANT
MANIFOLD NEPTUNE II (INSTRUMENTS) ×1 IMPLANT
NS IRRIG 500ML POUR BTL (IV SOLUTION) ×2 IMPLANT
PACK CYSTO (CUSTOM PROCEDURE TRAY) ×2 IMPLANT
SHEATH URETERAL 12FRX28CM (UROLOGICAL SUPPLIES) ×1 IMPLANT
STENT URET 6FRX24 CONTOUR (STENTS) ×2 IMPLANT
TUBE CONNECTING 12X1/4 (SUCTIONS) ×1 IMPLANT
TUBING UROLOGY SET (TUBING) ×1 IMPLANT

## 2018-11-29 NOTE — Op Note (Signed)
Preoperative diagnosis: Right hydronephrosis, left hydronephrosis Postoperative diagnosis: Right hydronephrosis, right UPJ obstruction, left hydronephrosis, left mid-distal ureteral stricture  Procedure: Cystoscopy, bilateral stent exchange, bilateral ureteroscopy, left brush biopsy  Surgeon: Junious Silk  Anesthesia: General  Indication for procedure: Miranda Mathis is a 68 year old female.  A few years ago she had a right distal stricture dilated and then more recently a right UPJ obstruction with intermittent right hydronephrosis but patent right UPJ on prior ureteroscopy.  Recently she was not feeling well and had a CT scan that revealed right hydro-and new onset left hydronephrosis down to just below the iliacs at the junction of the mid to distal ureter.  She underwent urgent stenting and was brought today for diagnostic ureteroscopy.  Findings: The urethra and bladder were unremarkable.  Right ureteroscopy- the ureter appeared normal, the UPJ was narrow but patent.  The semirigid and digital ureteroscope both passed into the collecting system without difficulty.  The collecting system appeared normal.  Left ureteroscopy- in the distal to mid ureter just below the iliacs the ureter was erythematous and edematous with a clear transition point to normal more dilated ureter.  Washing for cytology as well as a brush biopsy was obtained at the transition point and in the strictured ureter.  There was no papillary tumor.  The collecting system and proximal ureter appeared normal.  Description of procedure after consent was obtained patient brought to the operating room.  After adequate anesthesia she was placed in lithotomy position and prepped and draped in the usual sterile fashion.  A timeout was performed to confirm the patient and procedure.  The cystoscope was passed per urethra and the bladder inspected.  The right ureteral stent was grasped and removed through the urethral meatus.  I completely  remove the right stent and using a no touch technique took the 4.5 [place] long semirigid ureteroscope up the right ureter all the way to the collecting system without difficulty.  I then passed the sensor wire and backed the scope out.  I used the access sheath to place 2 wires and went adjacent to a Glidewire leaving it as the safety.  The access sheath went without difficulty.  The digital ureteroscope was advanced into the right collecting system without difficulty.  The collecting system appeared normal.  I was able to retroflex the scope and again visualized the UPJ with the scope and the wire passing through the UPJ.  The ureteroscope was backed out and the wire was backloaded on the cystoscope and a 6 x 24 cm right ureteral stent replaced.  Attention was then turned to the left ureter.  The left ureteral stent was grasped and removed.  Similar no touch technique was employed and the scope was passed without difficulty up into the left proximal ureter.  Coming back down there was an obvious area of narrowing and erythema at the mid to distal ureter junction.  Sitting does distal or inferior to this I washed it with saline and collected cytology through the scope.  I then took a brush biopsy and brush the area.  I then passed the ureteroscope back into the ureter and a sensor wire under direct vision.  I then used the access sheath to get a second wire in place and then passed the second wire adjacent to the Glidewire to dilate the distal to mid ureter.  The access sheath went with some resistance but dilated the ureter.  I then inspected the collecting system with the digital scope and noted it  to be normal as well as the proximal ureter.  I left the access sheath in place for about 5 minutes to dilate the ureter.  The access sheath was backed out over the scope and then the collecting system and ureter were inspected again and noted to be intact without injury but the mid to distal area was oozing from the  dilation.  The wire was then backloaded on the cystoscope and a 6 x 26 cm stent was advanced.  The wire was removed with a good coil seen in the kidney and a good coil in the bladder.  The bladder was drained and the scope removed.  I instilled some lidocaine jelly per urethra and she was awakened taken recovery room stable condition.  Complications: None  Blood loss: Minimal  Specimens to pathology: #1 left ureteral wash for cytology #2 left ureteral brush  Drains: 6 x 26 cm bilateral ureteral stents  Disposition: Patient stable to PACU

## 2018-11-29 NOTE — OR Nursing (Signed)
Right ureteral stent placed on 10/22/18 removed at 1411. Left ureteral stent placed on 10/22/18 removed at 1426 in OR #2 by Dr. Junious Silk.

## 2018-11-29 NOTE — Anesthesia Postprocedure Evaluation (Signed)
Anesthesia Post Note  Patient: Miranda Mathis  Procedure(s) Performed: CYSTOSCOPY WITH BILATERAL URETEROSCOPY AND STENT EXCHANGE/ WITH LEFT BRUSH BIOPSY (Bilateral Ureter)     Patient location during evaluation: PACU Anesthesia Type: MAC Level of consciousness: awake and alert Pain management: pain level controlled Vital Signs Assessment: post-procedure vital signs reviewed and stable Respiratory status: spontaneous breathing, nonlabored ventilation, respiratory function stable and patient connected to nasal cannula oxygen Cardiovascular status: stable and blood pressure returned to baseline Postop Assessment: no apparent nausea or vomiting Anesthetic complications: no    Last Vitals:  Vitals:   11/29/18 1506 11/29/18 1520  BP: 138/80   Pulse:  84  Resp:  17  Temp: 36.9 C   SpO2:  100%    Last Pain:  Vitals:   11/29/18 1520  TempSrc:   PainSc: [name]

## 2018-11-29 NOTE — Anesthesia Preprocedure Evaluation (Addendum)
Anesthesia Evaluation  Patient identified by MRN, date of birth, ID band Patient awake    Reviewed: Allergy & Precautions, NPO status , Patient's Chart, lab work & pertinent test results  Airway Mallampati: II  TM Distance: >3 FB Neck ROM: Full    Dental  (+) Edentulous Upper, Edentulous Lower   Pulmonary    Pulmonary exam normal        Cardiovascular hypertension, Pt. on medications  Rhythm:Regular Rate:Normal     Neuro/Psych Anxiety Depression    GI/Hepatic Neg liver ROS, GERD  Medicated,  Endo/Other  diabetesHypothyroidism   Renal/GU      Musculoskeletal  (+) Arthritis , Fibromyalgia -  Abdominal (+) + obese,   Peds  Hematology negative hematology ROS (+)   Anesthesia Other Findings - HLD  Reproductive/Obstetrics                            Anesthesia Physical Anesthesia Plan  ASA: III  Anesthesia Plan: MAC   Post-op Pain Management:    Induction: Intravenous  PONV Risk Score and Plan: 3 and Ondansetron, Propofol infusion and Midazolam  Airway Management Planned: Simple Face Mask and Natural Airway  Additional Equipment: None  Intra-op Plan:   Post-operative Plan:   Informed Consent: I have reviewed the patients History and Physical, chart, labs and discussed the procedure including the risks, benefits and alternatives for the proposed anesthesia with the patient or authorized representative who has indicated his/her understanding and acceptance.     Dental advisory given  Plan Discussed with: CRNA  Anesthesia Plan Comments: (COVID-19 Labs  No results for input(s): DDIMER, FERRITIN, LDH, CRP in the last 72 hours.  Lab Results      Component                Value               Date                      [id number]              NOT DETECTED        11/25/2018            )       Anesthesia Quick Evaluation

## 2018-11-29 NOTE — Interval H&P Note (Signed)
History and Physical Interval Note:  11/29/2018 1:47 PM  Miranda Mathis  has presented today for surgery, with the diagnosis of BILATERAL HYDRONEPHROSIS.  The various methods of treatment have been discussed with the patient and family. After consideration of risks, benefits and other options for treatment, the patient has consented to  Procedure(s): CYSTOSCOPY WITH BILATERAL URETEROSCOPY AND STENT EXCHANGE/POSSIBLE BALLOON DILATION WITH BIOPSY (N/A) as a surgical intervention.  The patient's history has been reviewed, patient examined, no change in status, stable for surgery.  She is well. No fever or dysuria. Culture was negative. She has frequency and needs more oxybutynin. She tolerates stents well. I have reviewed the patient's chart and labs.  Questions were answered to the patient's satisfaction.     Festus Aloe

## 2018-11-29 NOTE — Discharge Instructions (Signed)
Ureteral Stent Implantation, Care After Refer to this sheet in the next few weeks. These instructions provide you with information about caring for yourself after your procedure. Your health care provider may also give you more specific instructions. Your treatment has been planned according to current medical practices, but problems sometimes occur. Call your health care provider if you have any problems or questions after your procedure. What can I expect after the procedure? After the procedure, it is common to have:  Nausea.  Mild pain when you urinate. You may feel this pain in your lower back or lower abdomen. Pain should stop within a few minutes after you urinate. This may last for up to 1 week.  A small amount of blood in your urine for several days. Follow these instructions at home:  Medicines  Take over-the-counter and prescription medicines only as told by your health care provider.  If you were prescribed an antibiotic medicine, take it as told by your health care provider. Do not stop taking the antibiotic even if you start to feel better.  Do not drive for 24 hours if you received a sedative.  Do not drive or operate heavy machinery while taking prescription pain medicines. Activity  Return to your normal activities as told by your health care provider. Ask your health care provider what activities are safe for you.  Do not lift anything that is heavier than 10 lb (4.5 kg). Follow this limit for 1 week after your procedure, or for as long as told by your health care provider. General instructions  Watch for any blood in your urine. Call your health care provider if the amount of blood in your urine increases.  If you have a catheter: ? Follow instructions from your health care provider about taking care of your catheter and collection bag. ? Do not take baths, swim, or use a hot tub until your health care provider approves.  Drink enough fluid to keep your urine  clear or pale yellow.  Keep all follow-up visits as told by your health care provider. This is important. Contact a health care provider if:  You have pain that gets worse or does not get better with medicine, especially pain when you urinate.  You have difficulty urinating.  You feel nauseous or you vomit repeatedly during a period of more than 2 days after the procedure. Get help right away if:  Your urine is dark red or has blood clots in it.  You are leaking urine (have incontinence).  The end of the stent comes out of your urethra.  You cannot urinate.  You have sudden, sharp, or severe pain in your abdomen or lower back.  You have a fever. This information is not intended to replace advice given to you by your health care provider. Make sure you discuss any questions you have with your health care provider. Document Released: 02/08/2013 Document Revised: 11/14/2015 Document Reviewed: 12/21/2014 Elsevier Interactive Patient Education  2019 [place].  Avoid Motrin,Aleve Ibuprofen until after 9 pm  Post Anesthesia Home Care Instructions  Activity: Get plenty of rest for the remainder of the day. A responsible individual must stay with you for 24 hours following the procedure.  For the next 24 hours, DO NOT: -Drive a car -Paediatric nurse -Drink alcoholic beverages -Take any medication unless instructed by your physician -Make any legal decisions or sign important papers.  Meals: Start with liquid foods such as gelatin or soup. Progress to regular foods as tolerated. Avoid  greasy, spicy, heavy foods. If nausea and/or vomiting occur, drink only clear liquids until the nausea and/or vomiting subsides. Call your physician if vomiting continues.  Special Instructions/Symptoms: Your throat may feel dry or sore from the anesthesia or the breathing tube placed in your throat during surgery. If this causes discomfort, gargle with warm salt water. The discomfort should  disappear within 24 hours.  If you had a scopolamine patch placed behind your ear for the management of post- operative nausea and/or vomiting:  1. The medication in the patch is effective for 72 hours, after which it should be removed.  Wrap patch in a tissue and discard in the trash. Wash hands thoroughly with soap and water. 2. You may remove the patch earlier than 72 hours if you experience unpleasant side effects which may include dry mouth, dizziness or visual disturbances. 3. Avoid touching the patch. Wash your hands with soap and water after contact with the patch.

## 2018-11-29 NOTE — Transfer of Care (Signed)
Immediate Anesthesia Transfer of Care Note  Patient: Joeann R Alkire  Procedure(s) Performed: Procedure(s) (LRB): CYSTOSCOPY WITH BILATERAL URETEROSCOPY AND STENT EXCHANGE/ WITH LEFT BRUSH BIOPSY (Bilateral)  Patient Location: PACU  Anesthesia Type: General  Level of Consciousness: awake, oriented, sedated and patient cooperative  Airway & Oxygen Therapy: Patient Spontanous Breathing and Patient connected to face mask oxygen  Post-op Assessment: Report given to PACU RN and Post -op Vital signs reviewed and stable  Post vital signs: Reviewed and stable  Complications: No apparent anesthesia complications  Last Vitals:  Vitals Value Taken Time  BP    Temp    Pulse 77 11/29/2018  3:09 PM  Resp 8 11/29/2018  3:09 PM  SpO2 100 % 11/29/2018  3:09 PM  Vitals shown include unvalidated device data.  Last Pain:  Vitals:   11/29/18 1203  TempSrc: Oral  PainSc: 0-No pain      Patients Stated Pain Goal: 6 (11/29/18 1203)

## 2018-11-30 ENCOUNTER — Encounter (HOSPITAL_BASED_OUTPATIENT_CLINIC_OR_DEPARTMENT_OTHER): Payer: Self-pay | Admitting: Urology

## 2018-12-08 DIAGNOSIS — N131 Hydronephrosis with ureteral stricture, not elsewhere classified: Secondary | ICD-10-CM | POA: Diagnosis not present

## 2018-12-08 DIAGNOSIS — R310 Gross hematuria: Secondary | ICD-10-CM | POA: Diagnosis not present

## 2018-12-25 ENCOUNTER — Encounter (HOSPITAL_BASED_OUTPATIENT_CLINIC_OR_DEPARTMENT_OTHER): Payer: Self-pay | Admitting: Emergency Medicine

## 2018-12-25 ENCOUNTER — Emergency Department (HOSPITAL_BASED_OUTPATIENT_CLINIC_OR_DEPARTMENT_OTHER)
Admission: EM | Admit: 2018-12-25 | Discharge: 2018-12-25 | Disposition: A | Discharge status: Home / Self Care | Payer: Medicare Other | Attending: Emergency Medicine | Admitting: Emergency Medicine

## 2018-12-25 ENCOUNTER — Other Ambulatory Visit: Payer: Self-pay

## 2018-12-25 DIAGNOSIS — E119 Type 2 diabetes mellitus without complications: Secondary | ICD-10-CM | POA: Diagnosis not present

## 2018-12-25 DIAGNOSIS — W540XXA Bitten by dog, initial encounter: Secondary | ICD-10-CM | POA: Diagnosis not present

## 2018-12-25 DIAGNOSIS — Y999 Unspecified external cause status: Secondary | ICD-10-CM | POA: Insufficient documentation

## 2018-12-25 DIAGNOSIS — Y929 Unspecified place or not applicable: Secondary | ICD-10-CM | POA: Diagnosis not present

## 2018-12-25 DIAGNOSIS — E039 Hypothyroidism, unspecified: Secondary | ICD-10-CM | POA: Insufficient documentation

## 2018-12-25 DIAGNOSIS — S61452A Open bite of left hand, initial encounter: Secondary | ICD-10-CM | POA: Insufficient documentation

## 2018-12-25 DIAGNOSIS — I10 Essential (primary) hypertension: Secondary | ICD-10-CM | POA: Insufficient documentation

## 2018-12-25 DIAGNOSIS — Z79899 Other long term (current) drug therapy: Secondary | ICD-10-CM | POA: Diagnosis not present

## 2018-12-25 DIAGNOSIS — Y939 Activity, unspecified: Secondary | ICD-10-CM | POA: Insufficient documentation

## 2018-12-25 MED ORDER — AMOXICILLIN-POT CLAVULANATE 875-125 MG PO TABS: 1.0000 | ORAL_TABLET | Freq: Two times a day (BID) | ORAL | 0 refills | Status: AC

## 2018-12-25 NOTE — ED Provider Notes (Addendum)
[facility] EMERGENCY DEPARTMENT Provider Note   CSN: [id number] Arrival date & time: 12/25/18  1254    History   Chief Complaint Chief Complaint  Patient presents with  . Animal Bite    HPI Delphine Sizemore Neels is a 68 y.o. female.     68 y.o female with a PMH of Allergic rhinitis, GERD, HTN, DM presents to the ED with a chief complaint of dog bite x 2 days.  Patient reports she was at home when her dog bit her on dorsum aspect of her left hand.  Patient reports she is cleaned it with Betadine, soap and water, applied Bactroban to the area.  Today she reports there is swelling noted to that along with pain with flexing of her hand.  Patient reports the pain is worse with lifting objects.  According to patient her dog has his vaccines which are up-to-date.  She denies any fever, weakness, drainage from the wound.  The history is provided by the patient.  Animal Bite Associated symptoms: no fever and no rash     Past Medical History:  Diagnosis Date  . Allergic rhinitis   . Barrett's esophagus   . Bladder spasms   . Chronic back pain   . DDD (degenerative disc disease), lumbar   . Degenerative scoliosis   . Depression   . Fibromyalgia   . Full dentures   . GAD (generalized anxiety disorder)   . GERD (gastroesophageal reflux disease)   . History of bladder stone   . History of colon polyps   . History of kidney stones   . History of recurrent UTIs   . History of sepsis    10-18-2018  bacterium-ecoli blood culture  . Hydronephrosis, bilateral   . Hyperlipidemia   . Hypertension   . Hypothyroidism 11/02/2013 dx   followed by pcp  . IBS (irritable bowel syndrome)   . Osteopenia   . Peripheral neuropathy   . Psoriasis   . Seborrheic dermatitis of scalp   . Spondylolysis of lumbar region   . Wears glasses     Patient Active Problem List   Diagnosis Date Noted  . Bilateral hydronephrosis   . E coli bacteremia 10/21/2018  . Degenerative scoliosis 09/07/2018  .  Lumbar radiculopathy 09/07/2018  . Seborrheic dermatitis of scalp 12/02/2017  . Psoriasis 12/02/2017  . DM type 2 with diabetic peripheral neuropathy ([place]) 09/02/2016  . Recurrent kidney stones 08/29/2015  . Barrett's esophagus with esophagitis 08/29/2015  . GAD (generalized anxiety disorder) 08/29/2015  . GERD (gastroesophageal reflux disease) 08/29/2015  . Irritable bowel syndrome with constipation 08/29/2015  . Spondylosis of lumbar region without myelopathy or radiculopathy 08/29/2015  . Primary insomnia 10/25/2014  . Acquired hypothyroidism 11/02/2013  . Mixed hyperlipidemia 04/04/2012  . Allergic rhinitis 10/11/2007  . Fibromyalgia 10/11/2007  . Chronic low back pain 10/11/2007    Past Surgical History:  Procedure Laterality Date  . ABDOMINAL HYSTERECTOMY  1987  . BALLOON DILATION  07/10/2011   Procedure: BALLOON DILATION;  Surgeon: Fredricka Bonine, MD;  [facility];  Service: Urology;  Laterality: Right;  . CESAREAN SECTION  X2  . CHOLECYSTECTOMY    . CYSTO/ BILATERAL URETEROSCOPY / URETERAL BX'S/ BILATERAL URETERAL STENT PLACEMENT/ BLADDER STONE EXTRACTION  03-13-2011  . CYSTOSCOPY W/ RETROGRADES  07/10/2011   Procedure: CYSTOSCOPY WITH RETROGRADE PYELOGRAM;  Surgeon: Fredricka Bonine, MD;  Location: [facility];  Service: Urology;  Laterality: Right;  . CYSTOSCOPY W/ URETERAL STENT  PLACEMENT  07/10/2011   Procedure: CYSTOSCOPY WITH STENT REPLACEMENT;  Surgeon: Fredricka Bonine, MD;  Location: [facility];  Service: Urology;  Laterality: Right;  . CYSTOSCOPY W/ URETERAL STENT PLACEMENT Bilateral 10/22/2018   Procedure: CYSTOSCOPY WITH RETROGRADE PYELOGRAM/URETERAL STENT PLACEMENT-BILATERAL;  Surgeon: Ardis Hughs, MD;  Location: [facility];  Service: Urology;  Laterality: Bilateral;  . CYSTOSCOPY W/ URETERAL STENT REMOVAL  07/10/2011   Procedure: CYSTOSCOPY WITH STENT REMOVAL;  Surgeon: Fredricka Bonine, MD;  Location: [facility];  Service: Urology;  Laterality: Right;  . CYSTOSCOPY WITH URETEROSCOPY AND STENT PLACEMENT Bilateral 11/29/2018   Procedure: CYSTOSCOPY WITH BILATERAL URETEROSCOPY AND STENT EXCHANGE/ WITH LEFT BRUSH BIOPSY;  Surgeon: Festus Aloe, MD;  Location[facility];  Service: Urology;  Laterality: Bilateral;  . LUMBAR MICRODISCECTOMY  1990'S   L5 - S1  . RIGHT FOOT SURG  2003   HEEL  . RIGHT URETEROSCOPIC / URETERAL BX/ STENT PLACEMENT  04-17-2011     OB History   No obstetric history on file.      Home Medications    Prior to Admission medications   Medication Sig Start Date End Date Taking? Authorizing Provider  amitriptyline (ELAVIL) 25 MG tablet TAKE 1 TABLET BY MOUTH EVERYDAY AT BEDTIME Patient taking differently: Take 25 mg by mouth at bedtime.  10/08/18  Yes Leamon Arnt, MD  betamethasone dipropionate 0.05 % lotion Apply 1 application topically daily as needed. 12/02/17  Yes Leamon Arnt, MD  cholecalciferol (VITAMIN D) 1000 units tablet Take 2,000 Units by mouth daily.   Yes [provider]  clotrimazole-betamethasone (LOTRISONE) cream Apply 1 application topically 2 (two) times daily.  11/10/17  Yes [provider]  escitalopram (LEXAPRO) 20 MG tablet TAKE 1 TABLET BY MOUTH EVERY DAY Patient taking differently: Take 20 mg by mouth daily.  06/28/18  Yes Leamon Arnt, MD  gabapentin (NEURONTIN) 300 MG capsule Take 1 capsule (300 mg total) by mouth 3 (three) times daily. 09/16/18  Yes Leamon Arnt, MD  levothyroxine (SYNTHROID, LEVOTHROID) 137 MCG tablet Take 1 tablet (137 mcg total) by mouth daily before breakfast. 12/02/17  Yes Leamon Arnt, MD  lisinopril (ZESTRIL) 5 MG tablet Take 5 mg by mouth daily.   Yes [provider]  nitrofurantoin, macrocrystal-monohydrate, (MACROBID) 100 MG capsule Take 1 capsule (100 mg total) by mouth at bedtime for 30 days. 11/29/18 12/29/18 Yes  Festus Aloe, MD  omeprazole (PRILOSEC) 40 MG capsule Take 1 capsule (40 mg total) by mouth daily. 09/22/18  Yes Leamon Arnt, MD  oxybutynin (DITROPAN) 5 MG tablet Take 1 tablet (5 mg total) by mouth 3 (three) times daily as needed for bladder spasms. 11/29/18  Yes Festus Aloe, MD  pravastatin (PRAVACHOL) 40 MG tablet Take 40 mg by mouth daily.  09/28/17  Yes [provider]  senna-docusate (SENOKOT-S) 8.6-50 MG tablet Take 1-2 tablets by mouth every evening.   Yes [provider]  acetaminophen (TYLENOL) 500 MG tablet Take 1,000 mg by mouth every 4 (four) hours as needed. pain     [provider]  ketoconazole (NIZORAL) 2 % shampoo Apply 1 application topically 2 (two) times a week. 05/29/16   [provider]  ondansetron (ZOFRAN ODT) 4 MG disintegrating tablet 4mg  ODT q4 hours prn nausea/vomit Patient taking differently: Take 4 mg by mouth every 4 (four) hours as needed for nausea or vomiting. 4mg  ODT q4 hours prn nausea/vomit 10/18/18   Tyrone Nine,  Dan, DO  zolpidem (AMBIEN) 10 MG tablet TAKE 1 TABLET BY MOUTH EVERY DAY AT BEDTIME AS NEEDED FOR SLEEP 07/18/18   Leamon Arnt, MD    Family History Family History  Problem Relation Age of Onset  . COPD Mother   . Emphysema Mother   . Osteoarthritis Mother   . Rheum arthritis Mother   . Arthritis Mother   . Hypertension Mother   . Prostate cancer Father        w/mets  . Arthritis Father   . Birth defects Father   . Cancer Father   . Hypertension Father   . Arthritis Daughter   . Asthma Daughter   . Birth defects Daughter   . Depression Daughter   . Hearing loss Daughter   . Mental retardation Daughter   . Kidney Stones Daughter   . Arthritis Brother   . Hyperlipidemia Brother   . Coronary artery disease Paternal Grandfather   . Diabetes Paternal Aunt        x 3  . Arthritis Maternal Grandmother   . Colon cancer Neg Hx     Social History Social History   Tobacco Use  . Smoking  status: Never Smoker  . Smokeless tobacco: Never Used  Substance Use Topics  . Alcohol use: Yes    Alcohol/week: 1.0 standard drinks    Types: 1 Standard drinks or equivalent per week    Comment: rarely  . Drug use: No     Allergies   Patient has no known allergies.   Review of Systems Review of Systems  Constitutional: Negative for chills and fever.  HENT: Negative for ear pain and sore throat.   Eyes: Negative for pain and visual disturbance.  Respiratory: Negative for cough and shortness of breath.   Cardiovascular: Negative for chest pain and palpitations.  Gastrointestinal: Negative for abdominal pain and vomiting.  Genitourinary: Negative for dysuria and hematuria.  Musculoskeletal: Negative for arthralgias and back pain.  Skin: Positive for wound. Negative for color change and rash.  Neurological: Negative for seizures and syncope.  All other systems reviewed and are negative.    Physical Exam Updated Vital Signs BP 129/80 (BP Location: Right Arm)   Pulse 85   Temp 99.7 F (37.6 C) (Oral)   Resp 18   Ht 5\' 2"  (1.575 m)   Wt 95.3 kg   SpO2 96%   BMI 38.41 kg/m   Physical Exam Vitals signs and nursing note reviewed.  Constitutional:      General: She is not in acute distress.    Appearance: She is well-developed.     Comments: Non ill appearing.   HENT:     Head: Normocephalic and atraumatic.     Mouth/Throat:     Pharynx: No oropharyngeal exudate.  Eyes:     Pupils: Pupils are equal, round, and reactive to light.  Neck:     Musculoskeletal: Normal range of motion.  Cardiovascular:     Rate and Rhythm: Regular rhythm.     Heart sounds: Normal heart sounds.  Pulmonary:     Effort: Pulmonary effort is normal. No respiratory distress.     Breath sounds: Normal breath sounds.  Abdominal:     General: Bowel sounds are normal. There is no distension.     Palpations: Abdomen is soft.     Tenderness: There is no abdominal tenderness.  Musculoskeletal:         General: No tenderness or deformity.     Right lower  leg: No edema.     Left lower leg: No edema.  Skin:    General: Skin is warm and dry.     Findings: Erythema and wound present.          Comments: Wound with skin changes, pulses present. Capillary refill is intact pain with palpation.   Neurological:     Mental Status: She is alert and oriented to person, place, and time.          ED Treatments / Results  Labs (all labs ordered are listed, but only abnormal results are displayed) Labs Reviewed - No data to display  EKG None  Radiology No results found.  Procedures Procedures (including critical care time)  Medications Ordered in ED Medications - No data to display   Initial Impression / Assessment and Plan / ED Course  I have reviewed the triage vital signs and the nursing notes.  Pertinent labs & imaging results that were available during my care of the patient were reviewed by me and considered in my medical decision making (see chart for details).    Patient with an extensive past medical history including diabetes presents to the ED with a left dog bite by her own dog who currently has vaccines up-to-date.  She reports cleaning the wound, has been applying Bactroban to the area without improvement in symptoms.  She reports pain along the wound along with some minor drainage.  According to patient's records, her last Tdap vaccine was given on 11/13/2010 will not be updating this vaccine at this time as she is within the 10-year mark.  Please see photos attached to better observe wound.  According to patient her dog has vaccines which are up-to-date at this time, she has not experienced any fever or any other systemic symptoms.  I will provide her a prescription for Augmentin, she is to take this daily today for the next 7 days.  She may return to the emergency department in 3 days to have her wound recheck or her primary care physician's office.  No  treatment for rabies necessary at this time as dog is up-to-date with vaccines.  Return precautions discussed at length.   Portions of this note were generated with [employer]. Dictation errors may occur despite best attempts at proofreading.    Final Clinical Impressions(s) / ED Diagnoses   Final diagnoses:  Dog bite, initial encounter    ED Discharge Orders    None       Janeece Fitting, PA-C 12/25/18 1348    Janeece Fitting, PA-C 12/25/18 1351    Jola Schmidt, MD 12/26/18 [phone]

## 2018-12-25 NOTE — ED Triage Notes (Signed)
Dog bite to back of L hand on Friday by her dog. Vaccines UTD

## 2018-12-25 NOTE — Discharge Instructions (Signed)
I have prescribed antibiotics to help treat the infection on your left hand, please take 1 tablet twice a day for the next 7 days.  If you experience any fever, worsening symptoms you may return to the emergency department prior to.  I would like your wound rechecked within 3 days after starting antibiotic therapy either with your primary care physician, urgent care or the emergency department.

## 2018-12-26 ENCOUNTER — Telehealth: Payer: Self-pay | Admitting: Family Medicine

## 2018-12-26 NOTE — Telephone Encounter (Signed)
[facility] RECORD [facility] Patient Name: Miranda Mathis Gender: Female DOB: Sep 09, 1950 Age: 68 Y 35 M 29 D Return Phone Number: [phone] (Primary) Address: City/State/Zip: [place] [address] Client [facility] Client Site [facility] [employer] Type Call Who Is Calling Patient / Member / Family / Caregiver Call Type Triage / Clinical Relationship To Patient Self Return Phone Number [phone] (Primary) Chief Complaint Cuts and Lacerations Reason for Call Symptomatic / Request for Health Information Initial Comment her dog scratched up her hand, is already swollen. Dr. Jonni Sanger, verified location Translation No Nurse Assessment Nurse: Turner-Bodine, RN, Freda Munro Date/Time (Eastern Time): 12/24/2018 10:08:38 AM Confirm and document reason for call. If symptomatic, describe symptoms. ---her dog scratched up her hand, is already swollen, last night, prediabetic Has the patient had close contact with a person known or suspected to have the novel coronavirus illness OR traveled / lives in area with major community spread (including international travel) in the last 14 days from the onset of symptoms? * If Asymptomatic, screen for exposure and travel within the last 14 days. ---No Does the patient have any new or worsening symptoms? ---Yes Will a triage be completed? ---Yes Related visit to physician within the last 2 weeks? ---No Does the PT have any chronic conditions? (i.e. diabetes, asthma, this includes High risk factors for pregnancy, etc.) ---Yes List chronic conditions. ---prediabetic Is this a behavioral health or substance abuse call? ---No Guidelines Guideline Title Affirmed Question Affirmed Notes Nurse Date/Time (Eastern Time) Skin Injury [1] Looks infected (spreading redness, pus) AND [2] no fever Turner-Bodine, RN, Freda Munro 12/24/2018  10:10:25 AM Disp. Time Eilene Ghazi Time) Disposition Final User 12/24/2018 10:15:47 AM See PCP within 24 Hours Yes Turner-Bodine, RN, Freda Munro PLEASE NOTE: All timestamps contained within this report are represented as [place] Standard Time. CONFIDENTIALTY NOTICE: This fax transmission is intended only for the addressee. It contains information that is legally privileged, confidential or otherwise protected from use or disclosure. If you are not the intended recipient, you are strictly prohibited from reviewing, disclosing, copying using or disseminating any of this information or taking any action in reliance on or regarding this information. If you have received this fax in error, please notify us immediately by telephone so that we can arrange for its return to [name]. Phone: [phone], Toll-Free: [phone], Fax: [phone] Page: 2 of 2 Call Id: [id number] [place] Disagree/Comply Comply Caller Understands Yes PreDisposition Call Doctor Care Advice Given Per Guideline SEE PCP WITHIN 24 HOURS: * IF OFFICE WILL BE CLOSED AND NO PCP (PRIMARY CARE PROVIDER) SECONDLEVEL TRIAGE: You need to be seen within the next 24 hours. A clinic or an urgent care center is often a good source of care if your doctor's office is closed or you can't get an appointment. CLEANSING: * Wash the wound with soap and water for 5 minutes. * For any dirt, scrub gently with a washcloth. * For any bleeding, apply direct pressure with a sterile gauze or clean cloth for 10 minutes. * Apply an ANTIBIOTIC OINTMENT (e.g., OTC Bacitracin), covered by a Band-Aid or dressing. Change daily or if it becomes wet. ANTIBIOTIC OINTMENT: CALL BACK IF: * Fever occurs * You become worse. CARE ADVICE given per Skin Injury (Adult) guideline. Comments User: Terrall Laity, RN Date/Time Eilene Ghazi Time): 12/24/2018 10:17:52 AM Caller states that wound may need stitches but happened yesterday and it is is longer than 1 inch  and swollen, gave her  number [phone] to try to make an appointment today and advised her to go to UC to be seen today if she could not obtain a visit at above number Referrals GO TO FACILITY UNDECIDED

## 2018-12-26 NOTE — Telephone Encounter (Signed)
Recommend eval today if symptoms are worsening - here or another [place] office or UC.  Algis Greenhouse. Jerline Pain, MD 12/26/2018 3:16 PM

## 2018-12-26 NOTE — Telephone Encounter (Signed)
Spoke with patient.  She did go to [facility] ED @ [facility].  She sustained a dog bite to her left hand on 7/3 and received a script for Augmentin to take for 7 days.  Her wound was dressed and an antibiotic ointment was also applied.    She reports that her hand is still very swollen and painful today; swelling has spread up to her wrist.  She denies redness at site of wound or any red streaks up her arm.  She has an appointment to follow up on 7/8, based on current symptoms-ok to wait?  Please advise.

## 2018-12-28 ENCOUNTER — Other Ambulatory Visit: Payer: Self-pay

## 2018-12-28 ENCOUNTER — Ambulatory Visit (INDEPENDENT_AMBULATORY_CARE_PROVIDER_SITE_OTHER): Payer: Medicare Other | Admitting: Family Medicine

## 2018-12-28 ENCOUNTER — Ambulatory Visit (INDEPENDENT_AMBULATORY_CARE_PROVIDER_SITE_OTHER): Payer: Medicare Other

## 2018-12-28 ENCOUNTER — Encounter: Payer: Self-pay | Admitting: Family Medicine

## 2018-12-28 VITALS — BP 116/78 | HR 76 | Temp 98.4°F | Ht 62.0 in | Wt 212.8 lb

## 2018-12-28 DIAGNOSIS — S61452A Open bite of left hand, initial encounter: Secondary | ICD-10-CM | POA: Diagnosis not present

## 2018-12-28 DIAGNOSIS — M79642 Pain in left hand: Secondary | ICD-10-CM

## 2018-12-28 DIAGNOSIS — W540XXA Bitten by dog, initial encounter: Secondary | ICD-10-CM | POA: Diagnosis not present

## 2018-12-28 MED ORDER — DOXYCYCLINE HYCLATE 100 MG PO TABS: 100.0000 mg | ORAL_TABLET | Freq: Two times a day (BID) | ORAL | 0 refills | Status: DC

## 2018-12-28 NOTE — Patient Instructions (Signed)
It was very nice to see you today!  Please start the doxycycline. Continue the augmentin.  We will get an xray today.  Come back in 1 week for a recheck.  Let me know if symptoms change or worsen.   Take care, Dr Jerline Pain

## 2018-12-28 NOTE — Progress Notes (Signed)
   Chief Complaint:  Miranda Mathis is a 68 y.o. female who presents today with a chief complaint of rash.   Assessment/Plan:  Dog Bite / Cellulitis Symptoms not significantly improving with augmentin. Will add on doxycycline to cover MRSA. Check plain film given her persistent severe pain. Instructed pt to keep area clean and dry. She will follow up with me in a week.    Subjective:  HPI:  Rash Patient was bit by her dog 5 days ago on her left hand. Went to the ED 3 days ago and was started on augmentin. Dog was up to date on vaccines and she has had a tetanus vaccine within the last 10 years. Over the last couple of days she has not noticed any significant difference in her symptoms. She still has a significant amount of pain to the dorsal aspect of her hand and wrist. No fevers or chills. No redness. Some swelling. She has been keeping the area dry and clean.  No other obvious alleviating or aggravating factors.   ROS: Per HPI  PMH: She reports that she has never smoked. She has never used smokeless tobacco. She reports current alcohol use of about 1.0 standard drinks of alcohol per week. She reports that she does not use drugs.      Objective:  Physical Exam: There were no vitals taken for this visit.  Gen: NAD, resting comfortably Skin: Dorsal aspect of left hand with several irregular lacerations that seem to be healing normally. Minimal amount of surround erythema. She has exquisite tenderness to palpation along dorsal aspect of proximal wrist and along dorsal aspect of her hand.      Algis Greenhouse. Jerline Pain, MD 12/28/2018 10:57 AM

## 2018-12-29 DIAGNOSIS — R310 Gross hematuria: Secondary | ICD-10-CM | POA: Diagnosis not present

## 2018-12-29 DIAGNOSIS — N131 Hydronephrosis with ureteral stricture, not elsewhere classified: Secondary | ICD-10-CM | POA: Diagnosis not present

## 2018-12-29 NOTE — Progress Notes (Signed)
Please inform patient of the following:  Xray is normal. Would like for her to continue her antibiotics. We'll see her back next week.  Algis Greenhouse. Jerline Pain, MD 12/29/2018 9:45 AM

## 2019-01-05 ENCOUNTER — Ambulatory Visit (INDEPENDENT_AMBULATORY_CARE_PROVIDER_SITE_OTHER): Payer: Medicare Other | Admitting: Family Medicine

## 2019-01-05 ENCOUNTER — Other Ambulatory Visit: Payer: Self-pay

## 2019-01-05 ENCOUNTER — Encounter: Payer: Self-pay | Admitting: Family Medicine

## 2019-01-05 VITALS — BP 126/74 | HR 81 | Temp 98.0°F | Resp 16 | Ht 62.0 in | Wt 209.4 lb

## 2019-01-05 DIAGNOSIS — E1142 Type 2 diabetes mellitus with diabetic polyneuropathy: Secondary | ICD-10-CM

## 2019-01-05 DIAGNOSIS — M797 Fibromyalgia: Secondary | ICD-10-CM | POA: Diagnosis not present

## 2019-01-05 DIAGNOSIS — S6992XD Unspecified injury of left wrist, hand and finger(s), subsequent encounter: Secondary | ICD-10-CM

## 2019-01-05 DIAGNOSIS — N200 Calculus of kidney: Secondary | ICD-10-CM | POA: Diagnosis not present

## 2019-01-05 DIAGNOSIS — F411 Generalized anxiety disorder: Secondary | ICD-10-CM

## 2019-01-05 DIAGNOSIS — W540XXD Bitten by dog, subsequent encounter: Secondary | ICD-10-CM | POA: Diagnosis not present

## 2019-01-05 DIAGNOSIS — Z1239 Encounter for other screening for malignant neoplasm of breast: Secondary | ICD-10-CM

## 2019-01-05 LAB — POCT GLYCOSYLATED HEMOGLOBIN (HGB A1C): Hemoglobin A1C: 7.2 % — AB (ref 4.0–5.6)

## 2019-01-05 MED ORDER — SHINGRIX 50 MCG/0.5ML IM SUSR: 0.5000 mL | Freq: Once | INTRAMUSCULAR | 0 refills | Status: AC

## 2019-01-05 MED ORDER — METFORMIN HCL 500 MG PO TABS: 500.0000 mg | ORAL_TABLET | Freq: Two times a day (BID) | ORAL | 3 refills | Status: DC

## 2019-01-05 NOTE — Patient Instructions (Addendum)
Please return in December for your annual complete physical; please come fasting. And diabetes recheck.   Your diabetes is slightly worsening: start low dose metformin twice daily. Work on [employer] again.   If you have any questions or concerns, please don't hesitate to send me a message via MyChart or call the office at [phone]. Thank you for visiting with [name] today! It's our pleasure caring for you.   Diabetes Mellitus and Nutrition, Adult When you have diabetes (diabetes mellitus), it is very important to have healthy eating habits because your blood sugar (glucose) levels are greatly affected by what you eat and drink. Eating healthy foods in the appropriate amounts, at about the same times every day, can help you:  Control your blood glucose.  Lower your risk of heart disease.  Improve your blood pressure.  Reach or maintain a healthy weight. Every person with diabetes is different, and each person has different needs for a meal plan. Your health care provider may recommend that you work with a diet and nutrition specialist (dietitian) to make a meal plan that is best for you. Your meal plan may vary depending on factors such as:  The calories you need.  The medicines you take.  Your weight.  Your blood glucose, blood pressure, and cholesterol levels.  Your activity level.  Other health conditions you have, such as heart or kidney disease. How do carbohydrates affect me? Carbohydrates, also called carbs, affect your blood glucose level more than any other type of food. Eating carbs naturally raises the amount of glucose in your blood. Carb counting is a method for keeping track of how many carbs you eat. Counting carbs is important to keep your blood glucose at a healthy level, especially if you use insulin or take certain oral diabetes medicines. It is important to know how many carbs you can safely have in each meal. This is different for every person. Your  dietitian can help you calculate how many carbs you should have at each meal and for each snack. Foods that contain carbs include:  Bread, cereal, rice, pasta, and crackers.  Potatoes and corn.  Peas, beans, and lentils.  Milk and yogurt.  Fruit and juice.  Desserts, such as cakes, cookies, ice cream, and candy. How does alcohol affect me? Alcohol can cause a sudden decrease in blood glucose (hypoglycemia), especially if you use insulin or take certain oral diabetes medicines. Hypoglycemia can be a life-threatening condition. Symptoms of hypoglycemia (sleepiness, dizziness, and confusion) are similar to symptoms of having too much alcohol. If your health care provider says that alcohol is safe for you, follow these guidelines:  Limit alcohol intake to no more than 1 drink per day for nonpregnant women and 2 drinks per day for men. One drink equals 12 oz of beer, 5 oz of wine, or 1 oz of hard liquor.  Do not drink on an empty stomach.  Keep yourself hydrated with water, diet soda, or unsweetened iced tea.  Keep in mind that regular soda, juice, and other mixers may contain a lot of sugar and must be counted as carbs. What are tips for following this plan?  Reading food labels  Start by checking the serving size on the "Nutrition Facts" label of packaged foods and drinks. The amount of calories, carbs, fats, and other nutrients listed on the label is based on one serving of the item. Many items contain more than one serving per package.  Check the total grams (g) of  carbs in one serving. You can calculate the number of servings of carbs in one serving by dividing the total carbs by 15. For example, if a food has 30 g of total carbs, it would be equal to 2 servings of carbs.  Check the number of grams (g) of saturated and trans fats in one serving. Choose foods that have low or no amount of these fats.  Check the number of milligrams (mg) of salt (sodium) in one serving. Most people  should limit total sodium intake to less than 2,300 mg per day.  Always check the nutrition information of foods labeled as "low-fat" or "nonfat". These foods may be higher in added sugar or refined carbs and should be avoided.  Talk to your dietitian to identify your daily goals for nutrients listed on the label. Shopping  Avoid buying canned, premade, or processed foods. These foods tend to be high in fat, sodium, and added sugar.  Shop around the outside edge of the grocery store. This includes fresh fruits and vegetables, bulk grains, fresh meats, and fresh dairy. Cooking  Use low-heat cooking methods, such as baking, instead of high-heat cooking methods like deep frying.  Cook using healthy oils, such as olive, canola, or sunflower oil.  Avoid cooking with butter, cream, or high-fat meats. Meal planning  Eat meals and snacks regularly, preferably at the same times every day. Avoid going long periods of time without eating.  Eat foods high in fiber, such as fresh fruits, vegetables, beans, and whole grains. Talk to your dietitian about how many servings of carbs you can eat at each meal.  Eat 4-6 ounces (oz) of lean protein each day, such as lean meat, chicken, fish, eggs, or tofu. One oz of lean protein is equal to: ? 1 oz of meat, chicken, or fish. ? 1 egg. ?  cup of tofu.  Eat some foods each day that contain healthy fats, such as avocado, nuts, seeds, and fish. Lifestyle  Check your blood glucose regularly.  Exercise regularly as told by your health care provider. This may include: ? 150 minutes of moderate-intensity or vigorous-intensity exercise each week. This could be brisk walking, biking, or water aerobics. ? Stretching and doing strength exercises, such as yoga or weightlifting, at least 2 times a week.  Take medicines as told by your health care provider.  Do not use any products that contain nicotine or tobacco, such as cigarettes and e-cigarettes. If you need  help quitting, ask your health care provider.  Work with a Social worker or diabetes educator to identify strategies to manage stress and any emotional and social challenges. Questions to ask a health care provider  Do I need to meet with a diabetes educator?  Do I need to meet with a dietitian?  What number can I call if I have questions?  When are the best times to check my blood glucose? Where to find more information:  American Diabetes Association: diabetes.org  Academy of Nutrition and Dietetics: www.eatright.[employer] of Diabetes and Digestive and Kidney Diseases (NIH): [employer] Summary  A healthy meal plan will help you control your blood glucose and maintain a healthy lifestyle.  Working with a diet and nutrition specialist (dietitian) can help you make a meal plan that is best for you.  Keep in mind that carbohydrates (carbs) and alcohol have immediate effects on your blood glucose levels. It is important to count carbs and to use alcohol carefully. This information is not intended to replace  advice given to you by your health care provider. Make sure you discuss any questions you have with your health care provider. Document Released: 03/05/2005 Document Revised: 05/21/2017 Document Reviewed: 07/13/2016 Elsevier Patient Education  2020 [employer].

## 2019-01-05 NOTE — Progress Notes (Signed)
Subjective  CC:  Chief Complaint  Patient presents with  . Animal Bite    Left hand, was unable to complete TX due to stomach problems.  . Diabetes  . Hyperlipidemia  . Fibromyalgia    HPI: Miranda Mathis is a 68 y.o. female who presents to the office today for follow up of diabetes and problems listed above in the chief complaint.   Diabetes follow up: Her diabetic control is reported as Unchanged.  She has been eating more fruit and watermelon and perhaps less exercise due to the [place] pandemic.  She denies symptoms of hyperglycemia She denies exertional CP or SOB or symptomatic hypoglycemia. She denies foot sores or paresthesias.   Hyperlipidemia has been well controlled on statin.  No adverse effects.  Follow-up dog bite: She completed a course of Augmentin but was unable to complete the course of doxycycline.  Wound is healing nicely.  No longer with pain.  No drainage or warmth.  Fibromyalgia: Stable.  Not currently active Wt Readings from Last 3 Encounters:  01/05/19 209 lb 6.4 oz (95 kg)  12/28/18 212 lb 12 oz (96.5 kg)  12/25/18 210 lb (95.3 kg)    BP Readings from Last 3 Encounters:  01/05/19 126/74  12/28/18 116/78  12/25/18 129/80    Assessment  1. Dog bite, subsequent encounter   2. DM type 2 with diabetic peripheral neuropathy ([place])   3. Recurrent kidney stones   4. GAD (generalized anxiety disorder)   5. Fibromyalgia   6. Breast cancer screening      Plan   Diabetes is currently marginally controlled.  Has been diet controlled but now A1c is elevated.  Education and counseling done.  Recommend diabetic diet.  Decreasing fruit, weight loss and daily exercise.  Will start low-dose metformin.  Educated on risks and benefits.  Common side effects discussed.  Hyperlipidemia is well controlled on statin.  Health maintenance: Due for Shingrix and prescription provided to patient today to take to the pharmacy to get  Of myalgias well controlled  Dog bite,  left hand: Resolving.  Follow up: Return in about 5 months (around 06/07/2019) for complete physical, follow up Diabetes.. Orders Placed This Encounter  Procedures  . MM DIGITAL SCREENING BILATERAL  . POCT glycosylated hemoglobin (Hb A1C)   Meds ordered this encounter  Medications  . Zoster Vaccine Adjuvanted [facility]) injection    Sig: Inject 0.5 mLs into the muscle once for 1 dose. Please give 2nd dose 2-6 months after first dose    Dispense:  2 each    Refill:  0  . metFORMIN (GLUCOPHAGE) 500 MG tablet    Sig: Take 1 tablet (500 mg total) by mouth 2 (two) times daily with a meal.    Dispense:  180 tablet    Refill:  3      Immunization History  Administered Date(s) Administered  . Influenza Whole 03/22/2001, 03/22/2006, 03/22/2010  . Influenza, High Dose Seasonal PF 02/19/2016, 06/02/2018  . Influenza, Seasonal, Injecte, Preservative Fre 06/27/2012  . Pneumococcal Conjugate-13 11/27/2015  . Pneumococcal Polysaccharide-23 11/13/2010  . Tdap 11/13/2010  . Zoster 11/27/2015    Diabetes Related Lab Review: Lab Results  Component Value Date   HGBA1C 7.2 (A) 01/05/2019   HGBA1C 7.0 (H) 10/21/2018   HGBA1C 6.8 (A) 06/02/2018    Lab Results  Component Value Date   MICROALBUR 19.6 (H) 06/02/2018   Lab Results  Component Value Date   CREATININE 1.17 11/15/2018   BUN 14 11/15/2018  NA 140 11/29/2018   K 4.1 11/29/2018   CL 104 11/15/2018   CO2 26 11/15/2018   Lab Results  Component Value Date   CHOL 152 06/02/2018   CHOL 166 08/27/2017   CHOL 226 (H) 01/09/2015   Lab Results  Component Value Date   HDL 36.20 (L) 06/02/2018   HDL 35 08/27/2017   HDL 39.80 01/09/2015   Lab Results  Component Value Date   LDLCALC 97 06/02/2018   LDLCALC 112 08/27/2017   LDLCALC 112 08/27/2017   Lab Results  Component Value Date   TRIG 95.0 06/02/2018   TRIG 96 08/27/2017   TRIG 98.0 01/09/2015   Lab Results  Component Value Date   CHOLHDL 4 06/02/2018   CHOLHDL 6  01/09/2015   CHOLHDL 5 11/02/2013   Lab Results  Component Value Date   LDLDIRECT 165.3 11/13/2010   LDLDIRECT 143.5 08/30/2008   The 10-year ASCVD risk score Mikey Bussing DC Jr., et al., 2013) is: 18.5%   Values used to calculate the score:     Age: 63 years     Sex: Female     Is Non-Hispanic African American: No     Diabetic: Yes     Tobacco smoker: No     Systolic Blood Pressure: 379 mmHg     Is BP treated: Yes     HDL Cholesterol: 36.2 mg/dL     Total Cholesterol: 152 mg/dL I have reviewed the PMH, Fam and Soc history. Patient Active Problem List   Diagnosis Date Noted  . DM type 2 with diabetic peripheral neuropathy ([place]) 09/02/2016    Priority: High    Diagnosed 2019; no meds/diet controlled   . Recurrent kidney stones 08/29/2015    Priority: High    Overview:  S/p stents, multiple, [facility], Dr. Linton Ham   . Barrett's esophagus with esophagitis 08/29/2015    Priority: High    Last EGD 2018, no evidence of Barrett's esophagus, no further EGD recommended   . GAD (generalized anxiety disorder) 08/29/2015    Priority: High  . Acquired hypothyroidism 11/02/2013    Priority: High  . Mixed hyperlipidemia 04/04/2012    Priority: High  . Fibromyalgia 10/11/2007    Priority: High    Qualifier: Diagnosis of  By: Tiney Rouge CMA, Ellison Hughs     . Chronic low back pain 10/11/2007    Priority: High  . Psoriasis 12/02/2017    Priority: Medium  . GERD (gastroesophageal reflux disease) 08/29/2015    Priority: Medium  . Irritable bowel syndrome with constipation 08/29/2015    Priority: Medium  . Spondylosis of lumbar region without myelopathy or radiculopathy 08/29/2015    Priority: Medium  . Primary insomnia 10/25/2014    Priority: Medium    Overview:  Last Assessment & Plan:  Insomnia is stable with current regimen of Ambien. Continue current dosage of Ambien.   . Seborrheic dermatitis of scalp 12/02/2017    Priority: Low  . Allergic rhinitis 10/11/2007    Priority: Low     Qualifier: Diagnosis of  By: Tiney Rouge CMA, Ellison Hughs     . Bilateral hydronephrosis   . E coli bacteremia 10/21/2018  . Degenerative scoliosis 09/07/2018  . Lumbar radiculopathy 09/07/2018    Social History: Patient  reports that she has never smoked. She has never used smokeless tobacco. She reports current alcohol use of about 1.0 standard drinks of alcohol per week. She reports that she does not use drugs.  Review of Systems: Ophthalmic: negative for eye pain, loss  of vision or double vision Cardiovascular: negative for chest pain Respiratory: negative for SOB or persistent cough Gastrointestinal: negative for abdominal pain Genitourinary: negative for dysuria or gross hematuria MSK: negative for foot lesions Neurologic: negative for weakness or gait disturbance  Objective  Vitals: BP 126/74   Pulse 81   Temp 98 F (36.7 C) (Oral)   Resp 16   Ht 5\' 2"  (1.575 m)   Wt 209 lb 6.4 oz (95 kg)   SpO2 96%   BMI 38.30 kg/m  General: well appearing, no acute distress  Psych:  Alert and oriented, normal mood and affect HEENT:  Normocephalic, atraumatic, moist mucous membranes, supple neck  Cardiovascular:  Nl S1 and S2, RRR without murmur, gallop or rub. no edema Respiratory:  Good breath sounds bilaterally, CTAB with normal effort, no rales Gastrointestinal: normal BS, soft, nontender Skin:  Warm, no rashes Neurologic:   Mental status is normal. normal gait Extremity: Dorsal left hand with healing wound, scar tissue forming, decreased redness, nontender.  No drainage, no warmth Foot exam: no erythema, pallor, or cyanosis visible nl proprioception and sensation to monofilament testing bilaterally, +2 distal pulses bilaterally    Diabetic education: ongoing education regarding chronic disease management for diabetes was given today. We continue to reinforce the ABC's of diabetic management: A1c (<7 or 8 dependent upon patient), tight blood pressure control, and cholesterol  management with goal LDL < 100 minimally. We discuss diet strategies, exercise recommendations, medication options and possible side effects. At each visit, we review recommended immunizations and preventive care recommendations for diabetics and stress that good diabetic control can prevent other problems. See below for this patient's data.    Commons side effects, risks, benefits, and alternatives for medications and treatment plan prescribed today were discussed, and the patient expressed understanding of the given instructions. Patient is instructed to call or message via MyChart if he/she has any questions or concerns regarding our treatment plan. No barriers to understanding were identified. We discussed Red Flag symptoms and signs in detail. Patient expressed understanding regarding what to do in case of urgent or emergency type symptoms.   Medication list was reconciled, printed and provided to the patient in AVS. Patient instructions and summary information was reviewed with the patient as documented in the AVS. This note was prepared with assistance of Dragon voice recognition software. Occasional wrong-word or sound-a-like substitutions may have occurred due to the inherent limitations of voice recognition software

## 2019-01-09 ENCOUNTER — Other Ambulatory Visit: Payer: Self-pay | Admitting: *Deleted

## 2019-01-09 MED ORDER — LEVOTHYROXINE SODIUM 137 MCG PO TABS: 137.0000 ug | ORAL_TABLET | Freq: Every day | ORAL | 3 refills | Status: DC

## 2019-01-26 ENCOUNTER — Other Ambulatory Visit: Payer: Self-pay | Admitting: *Deleted

## 2019-01-26 MED ORDER — PRAVASTATIN SODIUM 40 MG PO TABS: 40.0000 mg | ORAL_TABLET | Freq: Every day | ORAL | 1 refills | Status: DC

## 2019-01-31 ENCOUNTER — Other Ambulatory Visit: Payer: Self-pay | Admitting: *Deleted

## 2019-02-01 MED ORDER — ZOLPIDEM TARTRATE 10 MG PO TABS: ORAL_TABLET | ORAL | 5 refills | Status: DC

## 2019-02-01 NOTE — Addendum Note (Signed)
Addended by: Billey Chang on: 02/01/2019 08:20 AM   Modules accepted: Orders

## 2019-02-20 ENCOUNTER — Encounter: Payer: Self-pay | Admitting: *Deleted

## 2019-02-23 ENCOUNTER — Ambulatory Visit: Payer: Medicare Other

## 2019-03-07 ENCOUNTER — Ambulatory Visit: Payer: Medicare Other

## 2019-03-09 ENCOUNTER — Other Ambulatory Visit: Payer: Self-pay | Admitting: *Deleted

## 2019-03-09 MED ORDER — ESCITALOPRAM OXALATE 20 MG PO TABS: 20.0000 mg | ORAL_TABLET | Freq: Every day | ORAL | 3 refills | Status: DC

## 2019-03-11 ENCOUNTER — Other Ambulatory Visit: Payer: Self-pay | Admitting: Family Medicine

## 2019-03-22 DIAGNOSIS — N131 Hydronephrosis with ureteral stricture, not elsewhere classified: Secondary | ICD-10-CM | POA: Diagnosis not present

## 2019-04-01 ENCOUNTER — Other Ambulatory Visit: Payer: Self-pay | Admitting: Family Medicine

## 2019-04-13 DIAGNOSIS — Z23 Encounter for immunization: Secondary | ICD-10-CM | POA: Diagnosis not present

## 2019-06-05 ENCOUNTER — Other Ambulatory Visit: Payer: Self-pay

## 2019-06-06 ENCOUNTER — Ambulatory Visit (INDEPENDENT_AMBULATORY_CARE_PROVIDER_SITE_OTHER): Payer: Medicare Other | Admitting: Family Medicine

## 2019-06-06 ENCOUNTER — Encounter: Payer: Self-pay | Admitting: Family Medicine

## 2019-06-06 ENCOUNTER — Other Ambulatory Visit: Payer: Self-pay

## 2019-06-06 VITALS — BP 132/72 | HR 72 | Temp 97.2°F | Ht 62.0 in | Wt 214.6 lb

## 2019-06-06 DIAGNOSIS — F5101 Primary insomnia: Secondary | ICD-10-CM

## 2019-06-06 DIAGNOSIS — M545 Low back pain, unspecified: Secondary | ICD-10-CM

## 2019-06-06 DIAGNOSIS — G8929 Other chronic pain: Secondary | ICD-10-CM

## 2019-06-06 DIAGNOSIS — F411 Generalized anxiety disorder: Secondary | ICD-10-CM

## 2019-06-06 DIAGNOSIS — M797 Fibromyalgia: Secondary | ICD-10-CM | POA: Diagnosis not present

## 2019-06-06 DIAGNOSIS — E782 Mixed hyperlipidemia: Secondary | ICD-10-CM | POA: Diagnosis not present

## 2019-06-06 DIAGNOSIS — E1142 Type 2 diabetes mellitus with diabetic polyneuropathy: Secondary | ICD-10-CM

## 2019-06-06 DIAGNOSIS — E039 Hypothyroidism, unspecified: Secondary | ICD-10-CM

## 2019-06-06 DIAGNOSIS — Z780 Asymptomatic menopausal state: Secondary | ICD-10-CM

## 2019-06-06 DIAGNOSIS — M8580 Other specified disorders of bone density and structure, unspecified site: Secondary | ICD-10-CM

## 2019-06-06 HISTORY — DX: Asymptomatic menopausal state: Z78.0

## 2019-06-06 HISTORY — DX: Other specified disorders of bone density and structure, unspecified site: M85.80

## 2019-06-06 LAB — LIPID PANEL
Cholesterol: 183 mg/dL (ref 0–200)
HDL: 36.5 mg/dL — ABNORMAL LOW (ref >39.00)
LDL Cholesterol: 123 mg/dL — ABNORMAL HIGH (ref 0–99)
NonHDL: 146.63
Total CHOL/HDL Ratio: 5
Triglycerides: 116 mg/dL (ref 0.0–149.0)
VLDL: 23.2 mg/dL (ref 0.0–40.0)

## 2019-06-06 LAB — COMPREHENSIVE METABOLIC PANEL
ALT: 27 U/L (ref 0–35)
AST: 25 U/L (ref 0–37)
Albumin: 4 g/dL (ref 3.5–5.2)
Alkaline Phosphatase: 56 U/L (ref 39–117)
BUN: 13 mg/dL (ref 6–23)
CO2: 28 mEq/L (ref 19–32)
Calcium: 9.8 mg/dL (ref 8.4–10.5)
Chloride: 103 mEq/L (ref 96–112)
Creatinine, Ser: 1.01 mg/dL (ref 0.40–1.20)
GFR: 54.34 mL/min — ABNORMAL LOW (ref >60.00)
Glucose, Bld: 114 mg/dL — ABNORMAL HIGH (ref 70–99)
Potassium: 4.2 mEq/L (ref 3.5–5.1)
Sodium: 140 mEq/L (ref 135–145)
Total Bilirubin: 0.3 mg/dL (ref 0.2–1.2)
Total Protein: 6.6 g/dL (ref 6.0–8.3)

## 2019-06-06 LAB — POCT GLYCOSYLATED HEMOGLOBIN (HGB A1C): Hemoglobin A1C: 6.4 % — AB (ref 4.0–5.6)

## 2019-06-06 LAB — MICROALBUMIN / CREATININE URINE RATIO
Creatinine,U: 94.5 mg/dL
Microalb Creat Ratio: 3.1 mg/g (ref 0.0–30.0)
Microalb, Ur: 2.9 mg/dL — ABNORMAL HIGH (ref 0.0–1.9)

## 2019-06-06 LAB — TSH: TSH: 3.34 u[IU]/mL (ref 0.35–4.50)

## 2019-06-06 MED ORDER — ROSUVASTATIN CALCIUM 20 MG PO TABS: 20.0000 mg | ORAL_TABLET | Freq: Every day | ORAL | 3 refills | Status: DC

## 2019-06-06 NOTE — Patient Instructions (Addendum)
Please return in 6 months for diabetes and blood pressure follow up. Medicare recommends an Annual Wellness Visit for all patients. Please schedule this to be done with our Nurse Educator, Loma Sousa. This is an informative "talk" visit; it's goals are to ensure that your health care needs are being met and to give you education regarding avoiding falls, ensuring you are not suffering from depression or problems with memory or thinking, and to educate you on Advance Care Planning. It helps me take good care of you!  I will release your lab results to you on your MyChart account with further instructions. Please reply with any questions.   If you have any questions or concerns, please don't hesitate to send me a message via MyChart or call the office at [phone]. Thank you for visiting with [name] today! It's our pleasure caring for you.  Please set up an appointment for a diabetic eye exam for next year and have the results sent to me.   Your diabetes is better! Keep limiting sweets and Dr. Malachi Bonds!   Preventive Care 35 Years and Older, Female Preventive care refers to lifestyle choices and visits with your health care provider that can promote health and wellness. This includes:  A yearly physical exam. This is also called an annual well check.  Regular dental and eye exams.  Immunizations.  Screening for certain conditions.  Healthy lifestyle choices, such as diet and exercise. What can I expect for my preventive care visit? Physical exam Your health care provider will check:  Height and weight. These may be used to calculate body mass index (BMI), which is a measurement that tells if you are at a healthy weight.  Heart rate and blood pressure.  Your skin for abnormal spots. Counseling Your health care provider may ask you questions about:  Alcohol, tobacco, and drug use.  Emotional well-being.  Home and relationship well-being.  Sexual activity.  Eating habits.  History  of falls.  Memory and ability to understand (cognition).  Work and work Statistician.  Pregnancy and menstrual history. What immunizations do I need?  Influenza (flu) vaccine  This is recommended every year. Tetanus, diphtheria, and pertussis (Tdap) vaccine  You may need a Td booster every 10 years. Varicella (chickenpox) vaccine  You may need this vaccine if you have not already been vaccinated. Zoster (shingles) vaccine  You may need this after age 4. Pneumococcal conjugate (PCV13) vaccine  One dose is recommended after age 22. Pneumococcal polysaccharide (PPSV23) vaccine  One dose is recommended after age 38. Measles, mumps, and rubella (MMR) vaccine  You may need at least one dose of MMR if you were born in 1957 or later. You may also need a second dose. Meningococcal conjugate (MenACWY) vaccine  You may need this if you have certain conditions. Hepatitis A vaccine  You may need this if you have certain conditions or if you travel or work in places where you may be exposed to hepatitis A. Hepatitis B vaccine  You may need this if you have certain conditions or if you travel or work in places where you may be exposed to hepatitis B. Haemophilus influenzae type b (Hib) vaccine  You may need this if you have certain conditions. You may receive vaccines as individual doses or as more than one vaccine together in one shot (combination vaccines). Talk with your health care provider about the risks and benefits of combination vaccines. What tests do I need? Blood tests  Lipid and cholesterol levels. These  may be checked every 5 years, or more frequently depending on your overall health.  Hepatitis C test.  Hepatitis B test. Screening  Lung cancer screening. You may have this screening every year starting at age 87 if you have a 30-pack-year history of smoking and currently smoke or have quit within the past 15 years.  Colorectal cancer screening. All adults should  have this screening starting at age 34 and continuing until age 26. Your health care provider may recommend screening at age 27 if you are at increased risk. You will have tests every 1-10 years, depending on your results and the type of screening test.  Diabetes screening. This is done by checking your blood sugar (glucose) after you have not eaten for a while (fasting). You may have this done every 1-3 years.  Mammogram. This may be done every 1-2 years. Talk with your health care provider about how often you should have regular mammograms.  BRCA-related cancer screening. This may be done if you have a family history of breast, ovarian, tubal, or peritoneal cancers. Other tests  Sexually transmitted disease (STD) testing.  Bone density scan. This is done to screen for osteoporosis. You may have this done starting at age 42. Follow these instructions at home: Eating and drinking  Eat a diet that includes fresh fruits and vegetables, whole grains, lean protein, and low-fat dairy products. Limit your intake of foods with high amounts of sugar, saturated fats, and salt.  Take vitamin and mineral supplements as recommended by your health care provider.  Do not drink alcohol if your health care provider tells you not to drink.  If you drink alcohol: ? Limit how much you have to 0-1 drink a day. ? Be aware of how much alcohol is in your drink. In the [place] one drink equals one 12 oz bottle of beer (355 mL), one 5 oz glass of wine (148 mL), or one 1 oz glass of hard liquor (44 mL). Lifestyle  Take daily care of your teeth and gums.  Stay active. Exercise for at least 30 minutes on 5 or more days each week.  Do not use any products that contain nicotine or tobacco, such as cigarettes, e-cigarettes, and chewing tobacco. If you need help quitting, ask your health care provider.  If you are sexually active, practice safe sex. Use a condom or other form of protection in order to prevent STIs  (sexually transmitted infections).  Talk with your health care provider about taking a low-dose aspirin or statin. What's next?  Go to your health care provider once a year for a well check visit.  Ask your health care provider how often you should have your eyes and teeth checked.  Stay up to date on all vaccines. This information is not intended to replace advice given to you by your health care provider. Make sure you discuss any questions you have with your health care provider. Document Released: 07/05/2015 Document Revised: 06/02/2018 Document Reviewed: 06/02/2018 Elsevier Patient Education  2020 [employer].

## 2019-06-06 NOTE — Progress Notes (Signed)
Please call patient: I have reviewed his/her lab results. Most labs look fine including her thyroid, kidney function and diabetes. However, cholesterol levels are now higher and above goal. I recommend changing to crestor 20mg  po qhs. She may stop the pravachol and get the crestor; please order #[id number] w/ 3 rf and d/x the pravachol. No other medication changes are needed at this time.

## 2019-06-06 NOTE — Progress Notes (Signed)
Subjective  Chief Complaint  Patient presents with  . Annual Exam  . Diabetes  . Hypertension  . Hyperlipidemia  . Hypothyroidism    HPI: Miranda Mathis is a 68 y.o. female who presents to [facility] [place] today for a Female Wellness Visit. She also has the concerns and/or needs as listed above in the chief complaint. These will be addressed in addition to the Health Maintenance Visit.   Wellness Visit: annual visit with health maintenance review and exam without Pap   HM: pt refuses mammogram at this time. Will consider again in the future. Last 2019. Gets her eye exams every 2 years: ok'd by her eye doctor. No h/o retinopathy. Eating better. Busy home life caring for daughter and her husband had appendectomy last night.  Chronic disease f/u and/or acute problem visit: (deemed necessary to be done in addition to the wellness visit):  Diabetes follow up: Her diabetic control is reported as Improved. Eating less sweets and down to 1 Dr. Malachi Bonds / day (down from several/day). Tolerating low dose metformin well; started 3 months ago.  She denies exertional CP or SOB or symptomatic hypoglycemia. She denies foot sores; + neuropathy controlled with gabapentin.+ microalbuminuria on ace  HTN: Feeling well. Taking medications w/o adverse effects. No symptoms of CHF, angina; no palpitations, sob, cp or lower extremity edema. Compliant with meds.   HLD on statin due for recheck. Had been near goal. Obese. Tolerated statin  Low thyroid: at times feels very fatigued but no edema or skin or hair changes. Compliant with medications. Due for recheck.   Fibromyalgia: stable right now. Back flares here and there. Manages well with elavil and gabapentin.   GAD: stable on chronic lexapro. Uses ambien for sleep. I reviewed patient's records from the PMP aware controlled substance registry today.   Reviewed dexa with osteopenia.  Immunization History  Administered Date(s) Administered   . Fluad Quad(high Dose 65+) 04/13/2019  . Influenza Whole 03/22/2001, 03/22/2006, 03/22/2010  . Influenza, High Dose Seasonal PF 02/19/2016, 06/02/2018  . Influenza, Seasonal, Injecte, Preservative Fre 06/27/2012  . Influenza-Unspecified 04/13/2019  . Pneumococcal Conjugate-13 11/27/2015  . Pneumococcal Polysaccharide-23 11/13/2010  . Tdap 11/13/2010  . Zoster 11/27/2015  . Zoster Recombinat (Shingrix) 04/13/2019    Diabetes Related Lab Review: Lab Results  Component Value Date   HGBA1C 6.4 (A) 06/06/2019   HGBA1C 7.2 (A) 01/05/2019   HGBA1C 7.0 (H) 10/21/2018    Lab Results  Component Value Date   MICROALBUR 19.6 (H) 06/02/2018   Lab Results  Component Value Date   CREATININE 1.17 11/15/2018   BUN 14 11/15/2018   NA 140 11/29/2018   K 4.1 11/29/2018   CL 104 11/15/2018   CO2 26 11/15/2018   Lab Results  Component Value Date   CHOL 152 06/02/2018   CHOL 166 08/27/2017   CHOL 226 (H) 01/09/2015   Lab Results  Component Value Date   HDL 36.20 (L) 06/02/2018   HDL 35 08/27/2017   HDL 39.80 01/09/2015   Lab Results  Component Value Date   LDLCALC 97 06/02/2018   LDLCALC 112 08/27/2017   LDLCALC 112 08/27/2017   Lab Results  Component Value Date   TRIG 95.0 06/02/2018   TRIG 96 08/27/2017   TRIG 98.0 01/09/2015   Lab Results  Component Value Date   CHOLHDL 4 06/02/2018   CHOLHDL 6 01/09/2015   CHOLHDL 5 11/02/2013   Lab Results  Component Value Date   LDLDIRECT  165.3 11/13/2010   LDLDIRECT 143.5 08/30/2008   The 10-year ASCVD risk score Mikey Bussing DC Jr., et al., 2013) is: 22.1%   Values used to calculate the score:     Age: 45 years     Sex: Female     Is Non-Hispanic African American: No     Diabetic: Yes     Tobacco smoker: No     Systolic Blood Pressure: 702 mmHg     Is BP treated: Yes     HDL Cholesterol: 36.2 mg/dL     Total Cholesterol: 152 mg/dL  BP Readings from Last 3 Encounters:  06/06/19 132/72  01/05/19 126/74  12/28/18 116/78    Wt Readings from Last 3 Encounters:  06/06/19 214 lb 9.6 oz (97.3 kg)  01/05/19 209 lb 6.4 oz (95 kg)  12/28/18 212 lb 12 oz (96.5 kg)    Health Maintenance  Topic Date Due  . OPHTHALMOLOGY EXAM  02/20/2020 (Originally 02/19/2019)  . MAMMOGRAM  06/05/2020 (Originally 12/22/2016)  . HEMOGLOBIN A1C  12/05/2019  . FOOT EXAM  01/05/2020  . DEXA SCAN  06/04/2020  . TETANUS/TDAP  11/12/2020  . COLONOSCOPY  06/23/2027  . INFLUENZA VACCINE  Completed  . Hepatitis C Screening  Completed  . PNA vac Low Risk Adult  Completed     Assessment  1. DM type 2 with diabetic peripheral neuropathy ([place])   2. Acquired hypothyroidism   3. Mixed hyperlipidemia   4. Fibromyalgia   5. GAD (generalized anxiety disorder)   6. Primary insomnia   7. Chronic midline low back pain without sciatica   8. Osteopenia after menopause      Plan  Female Wellness Visit:  Age appropriate Health Maintenance and Prevention measures were discussed with patient. Included topics are cancer screening recommendations, ways to keep healthy (see AVS) including dietary and exercise recommendations, regular eye and dental care, use of seat belts, and avoidance of moderate alcohol use and tobacco use. Refuses mammogram. Will readdress this summer. Normal clinical breast exam today. crc screen is up to date.   BMI: discussed patient's BMI and encouraged positive lifestyle modifications to help get to or maintain a target BMI.  HM needs and immunizations were addressed and ordered. See below for orders. See HM and immunization section for updates.imms up to date. Due 2nd shingrix in april  Routine labs and screening tests ordered including cmp, cbc and lipids where appropriate.  Discussed recommendations regarding Vit D and calcium supplementation (see AVS)  Chronic disease management visit and/or acute problem visit:  DM: improved control. Now at goal. Continue with diabetic diet changes and low dose metformin. Defers  eye exam until next year. Continue gabapentin for neuropathy. Nl foot exam today. Continue ace, asa and statin  HTN well controlled on ace. Check renal function and electrolytes.  HLD; recheck today on statin.   Low thyroid: recheck and adjust meds if needed. Clinically is euthyroid  Fibro and insomnia and GAD: doing well on combo of meds.  Due AWV. Follow up: Return in about 6 months (around 12/05/2019) for follow up of diabetes and hypertension.  Orders Placed This Encounter  Procedures  . CMP  . Lipids  . TSH  . Urine MAC  . A1C POCT   No orders of the defined types were placed in this encounter.     Lifestyle: Body mass index is 39.25 kg/m. Wt Readings from Last 3 Encounters:  06/06/19 214 lb 9.6 oz (97.3 kg)  01/05/19 209 lb 6.4 oz (95  kg)  12/28/18 212 lb 12 oz (96.5 kg)     Patient Active Problem List   Diagnosis Date Noted  . DM type 2 with diabetic peripheral neuropathy ([place]) 09/02/2016    Priority: High    Diagnosed 2019; started meds 2020; + microalbunuria on ace   . Recurrent kidney stones 08/29/2015    Priority: High    Overview:  S/p stents, multiple, [facility], Dr. Linton Ham   . GAD (generalized anxiety disorder) 08/29/2015    Priority: High  . Acquired hypothyroidism 11/02/2013    Priority: High  . Mixed hyperlipidemia 04/04/2012    Priority: High  . Fibromyalgia 10/11/2007    Priority: High    Qualifier: Diagnosis of  By: Tiney Rouge CMA, Ellison Hughs     . Chronic low back pain 10/11/2007    Priority: High  . Osteopenia after menopause 06/06/2019    Priority: Medium    dexa 2019 T = -1.9, multiple site osteopenia; recheck 2021   . Bilateral hydronephrosis     Priority: Medium  . Degenerative scoliosis 09/07/2018    Priority: Medium  . Psoriasis 12/02/2017    Priority: Medium  . History of Barrett's esophagus 08/29/2015    Priority: Medium    Last EGD 2018, no evidence of Barrett's esophagus, no further EGD recommended   . GERD  (gastroesophageal reflux disease) 08/29/2015    Priority: Medium  . Irritable bowel syndrome with constipation 08/29/2015    Priority: Medium  . Lumbosacral spondylosis with radiculopathy 08/29/2015    Priority: Medium  . Primary insomnia 10/25/2014    Priority: Medium    Overview:  Last Assessment & Plan:  Insomnia is stable with current regimen of Ambien. Continue current dosage of Ambien.   . Seborrheic dermatitis of scalp 12/02/2017    Priority: Low  . Allergic rhinitis 10/11/2007    Priority: Low    Qualifier: Diagnosis of  By: Lebron Conners, [place] Maintenance  Topic Date Due  . OPHTHALMOLOGY EXAM  02/20/2020 (Originally 02/19/2019)  . MAMMOGRAM  06/05/2020 (Originally 12/22/2016)  . HEMOGLOBIN A1C  12/05/2019  . FOOT EXAM  01/05/2020  . DEXA SCAN  06/04/2020  . TETANUS/TDAP  11/12/2020  . COLONOSCOPY  06/23/2027  . INFLUENZA VACCINE  Completed  . Hepatitis C Screening  Completed  . PNA vac Low Risk Adult  Completed   Immunization History  Administered Date(s) Administered  . Fluad Quad(high Dose 65+) 04/13/2019  . Influenza Whole 03/22/2001, 03/22/2006, 03/22/2010  . Influenza, High Dose Seasonal PF 02/19/2016, 06/02/2018  . Influenza, Seasonal, Injecte, Preservative Fre 06/27/2012  . Influenza-Unspecified 04/13/2019  . Pneumococcal Conjugate-13 11/27/2015  . Pneumococcal Polysaccharide-23 11/13/2010  . Tdap 11/13/2010  . Zoster 11/27/2015  . Zoster Recombinat (Shingrix) 04/13/2019   We updated and reviewed the patient's past history in detail and it is documented below. Allergies: Patient has No Known Allergies. Past Medical History Patient  has a past medical history of Allergic rhinitis, Barrett's esophagus, Bladder spasms, Chronic back pain, DDD (degenerative disc disease), lumbar, Degenerative scoliosis, Depression, Fibromyalgia, Full dentures, GAD (generalized anxiety disorder), GERD (gastroesophageal reflux disease), History of bladder stone,  History of colon polyps, History of kidney stones, History of recurrent UTIs, History of sepsis, Hydronephrosis, bilateral, Hyperlipidemia, Hypertension, Hypothyroidism (11/02/2013 dx), IBS (irritable bowel syndrome), Osteopenia, Osteopenia after menopause (06/06/2019), Peripheral neuropathy, Psoriasis, Seborrheic dermatitis of scalp, Spondylolysis of lumbar region, and Wears glasses. Past Surgical History Patient  has a past surgical history that includes Cesarean section (X2); CYSTO/  BILATERAL URETEROSCOPY / URETERAL BX'S/ BILATERAL URETERAL STENT PLACEMENT/ BLADDER STONE EXTRACTION (03-13-2011); RIGHT URETEROSCOPIC / URETERAL BX/ STENT PLACEMENT (04-17-2011); RIGHT FOOT SURG (2003); Lumbar microdiscectomy (1990'S); Abdominal hysterectomy (1987); Cystoscopy w/ retrogrades (07/10/2011); Balloon dilation (07/10/2011); Cystoscopy w/ ureteral stent removal (07/10/2011); Cystoscopy w/ ureteral stent placement (07/10/2011); Cystoscopy w/ ureteral stent placement (Bilateral, 10/22/2018); Cholecystectomy; and Cystoscopy with ureteroscopy and stent placement (Bilateral, 11/29/2018). Family History: Patient family history includes Arthritis in her brother, daughter, father, maternal grandmother, and mother; Asthma in her daughter; Birth defects in her daughter and father; COPD in her mother; Cancer in her father; Coronary artery disease in her paternal grandfather; Depression in her daughter; Diabetes in her paternal aunt; Emphysema in her mother; Hearing loss in her daughter; Hyperlipidemia in her brother; Hypertension in her father and mother; Kidney Stones in her daughter; Mental retardation in her daughter; Osteoarthritis in her mother; Prostate cancer in her father; Rheum arthritis in her mother. Social History:  Patient  reports that she has never smoked. She has never used smokeless tobacco. She reports current alcohol use of about 1.0 standard drinks of alcohol per week. She reports that she does not use  drugs.  Review of Systems: Constitutional: negative for fever or malaise Ophthalmic: negative for photophobia, double vision or loss of vision Cardiovascular: negative for chest pain, dyspnea on exertion, or new LE swelling Respiratory: negative for SOB or persistent cough Gastrointestinal: negative for abdominal pain, change in bowel habits or melena Genitourinary: negative for dysuria or gross hematuria, no abnormal uterine bleeding or disharge Musculoskeletal: negative for new gait disturbance or muscular weakness Integumentary: negative for new or persistent rashes, no breast lumps Neurological: negative for TIA or stroke symptoms Psychiatric: negative for SI or delusions Allergic/Immunologic: negative for hives  Patient Care Team    Relationship Specialty Notifications Start End  Leamon Arnt, MD PCP - General Family Medicine  12/02/17   Festus Aloe, MD  Urology  10/06/13   Gatha Mayer, MD  Gastroenterology  10/06/13   Curt Jews, OD  Optometry  12/02/17   Melina Schools, MD Consulting Physician Orthopedic Surgery  09/15/18     Objective  Vitals: BP 132/72 (BP Location: Left Arm, Patient Position: Sitting, Cuff Size: Large)   Pulse 72   Temp (!) 97.2 F (36.2 C) (Temporal)   Ht _0  (1.575 m)   Wt 214 lb 9.6 oz (97.3 kg)   SpO2 95%   BMI 39.25 kg/m  General:  Well developed, well nourished, no acute distress  Psych:  Alert and orientedx3,normal mood and affect HEENT:  Normocephalic, atraumatic, non-icteric sclera, PERRL, oropharynx is clear without mass or exudate, supple neck without adenopathy, mass or thyromegaly Cardiovascular:  Normal S1, S2, RRR without gallop, rub or murmur, nondisplaced PMI Respiratory:  Good breath sounds bilaterally, CTAB with normal respiratory effort Gastrointestinal: normal bowel sounds, soft, non-tender, no noted masses. No HSM MSK: no deformities, contusions. Joints are without erythema or swelling. Spine and CVA region are  nontender Skin:  Warm, no rashes or suspicious lesions noted Neurologic:    Mental status is normal. CN 2-11 are normal. Gross motor and sensory exams are normal. Normal gait. No tremor Breast Exam: No mass, skin retraction or nipple discharge is appreciated in either breast. No axillary adenopathy. Fibrocystic changes are not noted Diabetic Foot Exam: Appearance - no lesions, ulcers or calluses Skin - no sigificant pallor or erythema Monofilament testing - sensitive bilaterally in following locations:  Right - Great toe, medial, central, lateral  ball and posterior foot intact  Left - Great toe, medial, central, lateral ball and posterior foot intact Pulses - +2 distally bilaterally    Commons side effects, risks, benefits, and alternatives for medications and treatment plan prescribed today were discussed, and the patient expressed understanding of the given instructions. Patient is instructed to call or message via MyChart if he/she has any questions or concerns regarding our treatment plan. No barriers to understanding were identified. We discussed Red Flag symptoms and signs in detail. Patient expressed understanding regarding what to do in case of urgent or emergency type symptoms.   Medication list was reconciled, printed and provided to the patient in AVS. Patient instructions and summary information was reviewed with the patient as documented in the AVS. This note was prepared with assistance of Dragon voice recognition software. Occasional wrong-word or sound-a-like substitutions may have occurred due to the inherent limitations of voice recognition software  This visit occurred during the SARS-CoV-2 public health emergency.  Safety protocols were in place, including screening questions prior to the visit, additional usage of staff PPE, and extensive cleaning of exam room while observing appropriate contact time as indicated for disinfecting solutions.

## 2019-07-13 LAB — HM DIABETES EYE EXAM

## 2019-07-31 DIAGNOSIS — N131 Hydronephrosis with ureteral stricture, not elsewhere classified: Secondary | ICD-10-CM | POA: Diagnosis not present

## 2019-07-31 DIAGNOSIS — R300 Dysuria: Secondary | ICD-10-CM | POA: Diagnosis not present

## 2019-08-09 ENCOUNTER — Encounter: Payer: Self-pay | Admitting: Family Medicine

## 2019-08-09 ENCOUNTER — Other Ambulatory Visit: Payer: Self-pay

## 2019-08-09 MED ORDER — LISINOPRIL 5 MG PO TABS: 5.0000 mg | ORAL_TABLET | Freq: Every day | ORAL | 3 refills | Status: DC

## 2019-08-11 ENCOUNTER — Other Ambulatory Visit: Payer: Self-pay | Admitting: Family Medicine

## 2019-08-13 ENCOUNTER — Other Ambulatory Visit: Payer: Self-pay | Admitting: Family Medicine

## 2019-08-17 DIAGNOSIS — N131 Hydronephrosis with ureteral stricture, not elsewhere classified: Secondary | ICD-10-CM | POA: Diagnosis not present

## 2019-08-30 DIAGNOSIS — N131 Hydronephrosis with ureteral stricture, not elsewhere classified: Secondary | ICD-10-CM | POA: Diagnosis not present

## 2019-08-30 DIAGNOSIS — R1084 Generalized abdominal pain: Secondary | ICD-10-CM | POA: Diagnosis not present

## 2019-08-30 DIAGNOSIS — R300 Dysuria: Secondary | ICD-10-CM | POA: Diagnosis not present

## 2019-09-18 ENCOUNTER — Other Ambulatory Visit: Payer: Self-pay

## 2019-09-18 MED ORDER — CLOBETASOL PROPIONATE 0.05 % EX SOLN: 1.0000 "application " | Freq: Two times a day (BID) | CUTANEOUS | 1 refills | Status: DC

## 2019-09-20 DIAGNOSIS — R300 Dysuria: Secondary | ICD-10-CM | POA: Diagnosis not present

## 2019-09-20 DIAGNOSIS — R1032 Left lower quadrant pain: Secondary | ICD-10-CM | POA: Diagnosis not present

## 2019-10-05 DIAGNOSIS — R1084 Generalized abdominal pain: Secondary | ICD-10-CM | POA: Diagnosis not present

## 2019-10-05 DIAGNOSIS — R3121 Asymptomatic microscopic hematuria: Secondary | ICD-10-CM | POA: Diagnosis not present

## 2019-10-06 ENCOUNTER — Other Ambulatory Visit: Payer: Self-pay | Admitting: Family Medicine

## 2019-10-11 DIAGNOSIS — E119 Type 2 diabetes mellitus without complications: Secondary | ICD-10-CM | POA: Diagnosis not present

## 2019-10-12 DIAGNOSIS — N131 Hydronephrosis with ureteral stricture, not elsewhere classified: Secondary | ICD-10-CM | POA: Diagnosis not present

## 2019-10-12 DIAGNOSIS — N2889 Other specified disorders of kidney and ureter: Secondary | ICD-10-CM | POA: Diagnosis not present

## 2019-10-12 DIAGNOSIS — I700 Atherosclerosis of aorta: Secondary | ICD-10-CM | POA: Diagnosis not present

## 2019-10-12 DIAGNOSIS — R3121 Asymptomatic microscopic hematuria: Secondary | ICD-10-CM | POA: Diagnosis not present

## 2019-10-13 ENCOUNTER — Encounter: Payer: Self-pay | Admitting: Family Medicine

## 2019-10-20 ENCOUNTER — Encounter: Payer: Self-pay | Admitting: Physician Assistant

## 2019-10-20 ENCOUNTER — Ambulatory Visit (INDEPENDENT_AMBULATORY_CARE_PROVIDER_SITE_OTHER): Payer: Medicare Other | Admitting: Physician Assistant

## 2019-10-20 ENCOUNTER — Other Ambulatory Visit: Payer: Self-pay

## 2019-10-20 DIAGNOSIS — L409 Psoriasis, unspecified: Secondary | ICD-10-CM | POA: Diagnosis not present

## 2019-10-20 DIAGNOSIS — L570 Actinic keratosis: Secondary | ICD-10-CM | POA: Diagnosis not present

## 2019-10-20 DIAGNOSIS — Z1283 Encounter for screening for malignant neoplasm of skin: Secondary | ICD-10-CM

## 2019-10-20 MED ORDER — CEPHALEXIN 500 MG PO CAPS: 500.0000 mg | ORAL_CAPSULE | Freq: Two times a day (BID) | ORAL | 0 refills | Status: AC

## 2019-10-20 NOTE — Patient Instructions (Signed)

## 2019-10-20 NOTE — Progress Notes (Signed)
   Follow-Up Visit   Subjective  Miranda Mathis is a 69 y.o. female who presents for the following: Annual Exam (no concerns ).   Location: posterior scalp   Duration: years Quality: thick, swollen Associated Signs/Symptoms:itches Modifying Factors: clobetasol not working and too expensive Severity: flaring bad right now Context:    The following portions of the chart were reviewed this encounter and updated as appropriate: Tobacco  Allergies  Meds  Problems  Med Hx  Surg Hx  Fam Hx  Soc Hx      Objective  Well appearing patient in no apparent distress; mood and affect are within normal limits.  All skin waist up examined.  Objective  Right Submandibular Area: Erythematous patches with gritty scale.  Objective  Left Occipital Scalp, Mid Occipital Scalp (2): Well-marginated erythematous papules/plaques with silvery scale.  Thick and swollen.  Objective  UBSE: No Dn or NMSC   Assessment & Plan  AK (actinic keratosis) Right Submandibular Area  Destruction of lesion - Right Submandibular Area Complexity: simple   Destruction method: cryotherapy   Informed consent: discussed and consent obtained   Timeout:  patient name, date of birth, surgical site, and procedure verified Lesion destroyed using liquid nitrogen: Yes   Cryotherapy cycles:  1 Outcome: patient tolerated procedure well with no complications   Post-procedure details: wound care instructions given    Psoriasis (3) Left Occipital Scalp; Mid Occipital Scalp (2)  Keflex 500   Screening exam for skin cancer UBSE  Yearly skin checks  No atypical nevi noted at the time of the visit.  I, Torian Thoennes, PA-C, have reviewed all documentation for this visit. The documentation on 10/20/19 for the exam, diagnosis, procedures, and orders are all accurate and complete.

## 2019-10-21 ENCOUNTER — Other Ambulatory Visit: Payer: Self-pay | Admitting: Family Medicine

## 2019-10-24 DIAGNOSIS — R300 Dysuria: Secondary | ICD-10-CM | POA: Diagnosis not present

## 2019-10-24 DIAGNOSIS — R310 Gross hematuria: Secondary | ICD-10-CM | POA: Diagnosis not present

## 2019-10-24 DIAGNOSIS — N131 Hydronephrosis with ureteral stricture, not elsewhere classified: Secondary | ICD-10-CM | POA: Diagnosis not present

## 2019-10-24 DIAGNOSIS — R1032 Left lower quadrant pain: Secondary | ICD-10-CM | POA: Diagnosis not present

## 2019-10-26 ENCOUNTER — Other Ambulatory Visit: Payer: Self-pay | Admitting: Urology

## 2019-10-31 ENCOUNTER — Encounter (HOSPITAL_COMMUNITY): Payer: Self-pay

## 2019-10-31 NOTE — Patient Instructions (Addendum)
DUE TO COVID-19 ONLY ONE VISITOR ARE ALLOWED TO COME WITH YOU AND STAY IN THE WAITING ROOM ONLY DURING PRE OP AND PROCEDURE. THEN TWO VISITORS MAY VISIT WITH YOU IN YOUR PRIVATE ROOM DURING VISITING HOURS ONLY!!   COVID SWAB TESTING MUST BE COMPLETED ON: Friday, Nov 10, 2019 at 10:00 AM [address], [place] -Former [facility] enter pre surgical testing line (Must self quarantine after testing. Follow instructions on handout.)             Your procedure is scheduled on: Tuesday, Nov 14, 2019   Report to [facility] Main  Entrance    Report to admitting at 6:15 AM   Call this number if you have problems the morning of surgery [phone]   Do not eat food or drink liquids :After Midnight.   Oral Hygiene is also important to reduce your risk of infection.                                    Remember - BRUSH YOUR TEETH THE MORNING OF SURGERY WITH YOUR REGULAR TOOTHPASTE   Do NOT smoke after Midnight   Take these medicines the morning of surgery with A SIP OF WATER: Escitalopram, Gabapentin, Levothyroxine, Omeprazole, Oxybutynin, Rosuvastatin  DO NOT TAKE ANY ORAL DIABETIC MEDICATIONS DAY OF YOUR SURGERY                               You may not have any metal on your body including hair pins, jewelry, and body piercings             Do not wear make-up, lotions, powders, perfumes/cologne, or deodorant             Do not wear nail polish.  Do not shave  48 hours prior to surgery.              Do not bring valuables to the hospital. [place].   Contacts, dentures or bridgework may not be worn into surgery.    Patients discharged the day of surgery will not be allowed to drive home.   Special Instructions: Bring a copy of your healthcare power of attorney and living will documents         the day of surgery if you haven't scanned them in before.              Please read over the following fact sheets you  were given: IF YOU HAVE QUESTIONS ABOUT YOUR PRE OP INSTRUCTIONS PLEASE CALL [phone]   [place] - Preparing for Surgery Before surgery, you can play an important role.  Because skin is not sterile, your skin needs to be as free of germs as possible.  You can reduce the number of germs on your skin by washing with CHG (chlorahexidine gluconate) soap before surgery.  CHG is an antiseptic cleaner which kills germs and bonds with the skin to continue killing germs even after washing. Please DO NOT use if you have an allergy to CHG or antibacterial soaps.  If your skin becomes reddened/irritated stop using the CHG and inform your nurse when you arrive at Short Stay. Do not shave (including legs and underarms) for at least 48 hours prior to  the first CHG shower.  You may shave your face/neck.  Please follow these instructions carefully:  1.  Shower with CHG Soap the night before surgery and the  morning of surgery.  2.  If you choose to wash your hair, wash your hair first as usual with your normal  shampoo.  3.  After you shampoo, rinse your hair and body thoroughly to remove the shampoo.                             4.  Use CHG as you would any other liquid soap.  You can apply chg directly to the skin and wash.  Gently with a scrungie or clean washcloth.  5.  Apply the CHG Soap to your body ONLY FROM THE NECK DOWN.   Do   not use on face/ open                           Wound or open sores. Avoid contact with eyes, ears mouth and   genitals (private parts).                       Wash face,  Genitals (private parts) with your normal soap.             6.  Wash thoroughly, paying special attention to the area where your    surgery  will be performed.  7.  Thoroughly rinse your body with warm water from the neck down.  8.  DO NOT shower/wash with your normal soap after using and rinsing off the CHG Soap.                9.  Pat yourself dry with a clean towel.            10.  Wear clean pajamas.             11.  Place clean sheets on your bed the night of your first shower and do not  sleep with pets. Day of Surgery : Do not apply any lotions/deodorants the morning of surgery.  Please wear clean clothes to the hospital/surgery center.  FAILURE TO FOLLOW THESE INSTRUCTIONS MAY RESULT IN THE CANCELLATION OF YOUR SURGERY  PATIENT SIGNATURE_________________________________  NURSE SIGNATURE__________________________________  ________________________________________________________________________

## 2019-11-01 ENCOUNTER — Encounter (HOSPITAL_COMMUNITY)
Admission: RE | Admit: 2019-11-01 | Discharge: 2019-11-01 | Disposition: A | Discharge status: Home / Self Care | Payer: Medicare Other | Source: Ambulatory Visit | Attending: Urology | Admitting: Urology

## 2019-11-01 ENCOUNTER — Other Ambulatory Visit: Payer: Self-pay

## 2019-11-01 ENCOUNTER — Encounter (HOSPITAL_COMMUNITY): Payer: Self-pay

## 2019-11-01 DIAGNOSIS — Z01818 Encounter for other preprocedural examination: Secondary | ICD-10-CM | POA: Insufficient documentation

## 2019-11-01 HISTORY — DX: Personal history of other diseases of the nervous system and sense organs: Z86.69

## 2019-11-01 HISTORY — DX: Insomnia, unspecified: G47.00

## 2019-11-01 HISTORY — DX: Restless legs syndrome: G25.81

## 2019-11-01 HISTORY — DX: Type 2 diabetes mellitus without complications: E11.9

## 2019-11-01 NOTE — Progress Notes (Signed)
PCP - Dr. Cathlean Marseilles last office visit 06/06/19 in epic Cardiologist - N/A  Chest x-ray - greater than 1 year EKG - 11/02/19 in epic Stress Test - N/A ECHO - N/A Cardiac Cath - N/A  Sleep Study - N/A CPAP - N/A  Fasting Blood Sugar - Does not check at home Checks Blood Sugar _N/A____ times a day  Blood Thinner Instructions:  N/A Aspirin Instructions:  N/A Last Dose:  N/A  Anesthesia review: N/A  Patient denies shortness of breath, fever, cough and chest pain at PAT appointment   Patient verbalized understanding of instructions that were given to them at the PAT appointment. Patient was also instructed that they will need to review over the PAT instructions again at home before surgery.

## 2019-11-02 ENCOUNTER — Encounter (HOSPITAL_COMMUNITY)
Admission: RE | Admit: 2019-11-02 | Discharge: 2019-11-02 | Disposition: A | Discharge status: Home / Self Care | Payer: Medicare Other | Source: Ambulatory Visit | Attending: Urology | Admitting: Urology

## 2019-11-02 DIAGNOSIS — Z01818 Encounter for other preprocedural examination: Secondary | ICD-10-CM | POA: Diagnosis not present

## 2019-11-02 LAB — CBC
HCT: 43.6 % (ref 36.0–46.0)
Hemoglobin: 13.5 g/dL (ref 12.0–15.0)
MCH: 28.2 pg (ref 26.0–34.0)
MCHC: 31 g/dL (ref 30.0–36.0)
MCV: 91.2 fL (ref 80.0–100.0)
Platelets: 221 10*3/uL (ref 150–400)
RBC: 4.78 MIL/uL (ref 3.87–5.11)
RDW: 14.6 % (ref 11.5–15.5)
WBC: 7.1 10*3/uL (ref 4.0–10.5)
nRBC: 0 % (ref 0.0–0.2)

## 2019-11-02 LAB — BASIC METABOLIC PANEL
Anion gap: 10 (ref 5–15)
BUN: 11 mg/dL (ref 8–23)
CO2: 25 mmol/L (ref 22–32)
Calcium: 9.4 mg/dL (ref 8.9–10.3)
Chloride: 105 mmol/L (ref 98–111)
Creatinine, Ser: 0.96 mg/dL (ref 0.44–1.00)
GFR calc Af Amer: >60 mL/min (ref >60)
GFR calc non Af Amer: >60 mL/min (ref >60)
Glucose, Bld: 162 mg/dL — ABNORMAL HIGH (ref 70–99)
Potassium: 4.5 mmol/L (ref 3.5–5.1)
Sodium: 140 mmol/L (ref 135–145)

## 2019-11-02 LAB — HEMOGLOBIN A1C
Hgb A1c MFr Bld: 7.1 % — ABNORMAL HIGH (ref 4.8–5.6)
Mean Plasma Glucose: 157.07 mg/dL

## 2019-11-02 LAB — GLUCOSE, CAPILLARY: Glucose-Capillary: 151 mg/dL — ABNORMAL HIGH (ref 70–99)

## 2019-11-02 LAB — SURGICAL PCR SCREEN
MRSA, PCR: POSITIVE — AB
Staphylococcus aureus: POSITIVE — AB

## 2019-11-03 NOTE — Progress Notes (Signed)
PCR results 11/02/19 faxed to Dr. Junious Silk via epic 11/03/19.

## 2019-11-10 ENCOUNTER — Other Ambulatory Visit (HOSPITAL_COMMUNITY)
Admission: RE | Admit: 2019-11-10 | Discharge: 2019-11-10 | Disposition: A | Discharge status: Home / Self Care | Payer: Medicare Other | Source: Ambulatory Visit | Attending: Urology | Admitting: Urology

## 2019-11-10 DIAGNOSIS — Z20822 Contact with and (suspected) exposure to covid-19: Secondary | ICD-10-CM | POA: Insufficient documentation

## 2019-11-10 DIAGNOSIS — Z01812 Encounter for preprocedural laboratory examination: Secondary | ICD-10-CM | POA: Insufficient documentation

## 2019-11-10 LAB — SARS CORONAVIRUS 2 (TAT 6-24 HRS): SARS Coronavirus 2: NEGATIVE

## 2019-11-12 NOTE — H&P (Signed)
Office Visit Report     10/24/2019   --------------------------------------------------------------------------------   Miranda Borer. Mathis  MRN: [id number]  DOB: 03-23-1951, 69 year old Female  SSN: [id number]   PRIMARY CARE:  Camille L. Jonni Sanger, MD  REFERRING:  Georgette Dover, MD  PROVIDER:  Festus Aloe, M.D.  LOCATION:  [facility] [phone]     --------------------------------------------------------------------------------   CC: I have hydronephrosis.  HPI: Miranda Mathis is a 69 year-old female established patient who is here for hydronephrosis.  Her problem was diagnosed approximately 02/21/2011. The problem is on both sides. She had the following x-rays done: Renal Ultrasound and CT Scan.   She is currently having flank pain. She denies having back pain, groin pain, nausea, vomiting, fever, and chills.   She returns with h/o right distal ureteral sx 2012, right UPJ obstruction 2018 and then left distal ureteral stricture March 2020.   -a 2012 CT showed moderate right hydro down to the distal ureter, with no definite stone.  -Sep 2012 bilateral URS Sept 2012 with brush biopsy and stent placement for right distal ureteral stricture and some subtle mucosal enhancement of left distal ureter. She also had a small stone in the bladder. I could not get access up to the right stricture, therefore blindly brushed and then stented the right. A small stone was found in the bladder which possibly passed from right ureter as no stone was seen in the bladder on her Aug 2012 CT.  Results Sept 21, 2012:  Urine cytology - no malignant cells, few wbc's, few rbc's  Bladder stone - Calcium phosphate, CaOxalate  Left ureteral brush - rare atypical cells, chronic inflammation = favor reactive atypia  Left ureteral biopsy - insufficient material  Right ureteral brush - limited material, reactive atypia favored, cant exclude LG neoplasm   -Apr 17, 2011. The left stent was removed.  Right URS revealed a narrow ureter with no obvious tumor. It was brushed and biopsy done. Despite a 6Fr stent left in place prior I could not get scope past the stricture. An 8Fr stent was placed on the right.  Results Apr 17, 2011:  -right ureter brush - atypia favor reactive  -right ureter biopsy - fibromuscular stroma with inflammation, necrosis, granulation (c/w ulcer) - no malignancy   -Jan 2013 - cysto, balloon dilation right ureteral stricture Jul 10, 2011. The stent was removed Aug 10, 2011.   A September 2013 ultrasound showed minimal right hydronephrosis and a postvoid of 9 mL.   May 2017 presented with right flank pain. CT again Rt hydro down to distal ureteral sx (similar to 2012). Bun 19, cr 0.9. A Mag-3 Lasix scan -- Rt 42% fxn, Lt 58% fxn, t1/2 Rt 6 min, Lt 3 min.   She had flank pain again Jun 2017. U/S - right hydro and dilated ureter similar to prior - presented with flank pain. October 2017 repeat MAG3 Lasix renogram showed right function 44% with washout and 5.7 minutes, left function 56% with washout and 4.4 minutes. Repeat ultrasound showed resolution of the right hydronephrosis and nl PVR (zero).   She had severe pain Mar 24, 2017 and CT showed now possible rt UPJ although the proximal ureter looked dilated. She was taken for Rt RGP which appeared like a right UPJ and the right distal ureter narrow but passable with semi-rigid. I couldn't get a flexible proximal so stent left and repeat URS showed a patent but narrow UPJ. Brush biopsy benign, cytology collected but not  sent (lab did not receive). Stent replaced. We removed the stents in office and she did well.   She presented again Mar 2020 and Apr 2020 with left flank and RLQ pain. CT showed L > R hydro Mar 2020. She developed fever and positive blood cx's for e coli. Repeat CT 10/18/2018 again revealed L > R hydro without stone or mass and transition at the left iliacs. She was stented bilaterally 10/22/2018. She does well with  stents.   She was taken to OR 11/29/2018 for cystoscopy, bilateral ureteroscopy, left cytology and brush biopsy. This revealed patent right UPJ and right ureter. On the left there was a specific transition point at the mid to distal ureter where the ureter appeared edematous and erythematous. Cytology showed no malignant cells and brush biopsy showed atypia favor reactive. Bree removed the stents Jul 2020. F/u US benign Sep 2020 with no hydro.   Her bun was 13, cr 1.01 06/06/2019. Feb 2021 she had bladder pain and left flank pain. UTI+ with e coli. F/u US benign 08/17/2019. No hydro.   Currently, she continues to have episodes of left flank pain, LLQ pain,nausea. ABx not helping as much. CT scan 04/21 with enhancement of the left proximal ureter and thickening of the mid-third of the left ureter.  Last urine negative. Cytology returned mild inflammation, squamous cells, urothelial cells, no HG urothelial carcinoma. She is taking oxyb TID. On cephalexin now for a skin infection. Only oxycodone helping pain .     ALLERGIES: None   MEDICATIONS: Crestor  Levothyroxine Sodium 137 mcg tablet  Lisinopril  Omeprazole 40 mg capsule,delayed release  Oxybutynin Chloride  Amitriptyline Hcl 25 mg tablet tablet PRN  Betamethasone Diprop Augmented 0.05 % lotion  Cephalexin 500 mg capsule  Gabapentin  Lexapro  Oxycodone-Acetaminophen 5 mg-325 mg tablet 1 tablet PO Q 6 H PRN  Senna-Docusate Sodium  Triamcinolone Acetonide 0.1 % lotion  Tylenol  Vitamin D3 50 mcg (2,000 unit) tablet  Zolpidem Tartrate 10 mg tablet Oral     GU PSH: Cysto Dilate Ureteral Stricture - 2013 Cysto Remove Stent FB Sim - 12/29/2018, 2018 Cysto Uretero Biopsy Fulgura, Right - 2018, 2012, 2012 Cystoscopy And Biopsy, Left - 11/29/2018 Cystoscopy Insert Stent, Bilateral - 11/29/2018, Bilateral - 10/22/2018, Right - 2018, 2012, 2012 Cystoscopy Ureteroscopy, Bilateral - 11/29/2018, Right - 2018 Hysterectomy Unilat SO - 2012 Locm  300-399Mg /Ml Iodine,1Ml - 10/12/2019       PSH Notes: Cystourethroscopy With Treatment Of Ureteral Stricture, Cystoscopy With Ureteroscopy For Biopsy Right, Cystoscopy With Insertion Of Ureteral Stent Right, Cystoscopy With Insertion Of Ureteral Stent Bilateral, Cystoscopy With Ureteroscopy For Biopsy Bilateral, Cholecystectomy, Hysterectomy, Foot Surgery, Cesarean Section   NON-GU PSH: Cesarean Delivery Only - 2012 Cholecystectomy (open) - 2012     GU PMH: LLQ pain - 09/20/2019 Ureteral obstruction, recent US looked good - no hydro. - 08/30/2019, Renal US next available. See me OV next available after [name]. Discussed possible need for stents/ureteroscopy. , - 07/31/2019, - 03/22/2019, - 12/29/2018, - 12/08/2018, - 11/04/2018, - 08/23/2018, - 2018, - 2018, - 2017, - 2017 Dysuria, check UTI+ - discussed nature r/b/a with patient. I sent rx for Bactrim. - 07/31/2019 Flank Pain - 08/23/2018 Hydronephrosis Unspec - 2017, Hydronephrosis, - 2017 Microscopic hematuria - 2017 RUQ pain - 2017 History of urolithiasis, History of renal calculi - 2017 Other microscopic hematuria, Microscopic hematuria - 2017 Ureteral stricture, Ureteral stricture - 2017 Bladder Stone, Bladder calculus - 2014 Urinary Tract Inf, Unspec site, Pyuria - 2014  PMH Notes:  2011-02-18 13:32:21 - Note: Arthritis   NON-GU PMH: Bacteriuria - 03/22/2019 Pyuria/other UA findings - 08/23/2018 Encounter for general adult medical examination without abnormal findings, Encounter for preventive health examination - 2017 Personal history of other endocrine, nutritional and metabolic disease, History of hyperthyroidism - 2017, History of hypercholesterolemia, - 2017 Personal history of other diseases of the digestive system, History of esophageal reflux - 2014    FAMILY HISTORY: Chronic Obstructive Pulmonary Disease - Mother Congestive Heart Failure - Mother, Brother Death In The Family Father - Runs In Family Death In The Family Mother - Runs  In Family Family Health Status Number - Runs In Family Kidney Stones - Runs In Family Prostate Cancer - Father   SOCIAL HISTORY: Marital Status: Married Preferred Language: English; Ethnicity: Not Hispanic Or Latino; Race: White Current Smoking Status: Patient has never smoked.  Drinks 4+ caffeinated drinks per day. Patient's occupation is/was retired.    REVIEW OF SYSTEMS:    GU Review Female:   Patient denies frequent urination, hard to postpone urination, burning /pain with urination, get up at night to urinate, leakage of urine, stream starts and stops, trouble starting your stream, have to strain to urinate, and being pregnant.  Gastrointestinal (Upper):   Patient reports nausea. Patient denies vomiting and indigestion/ heartburn.  Gastrointestinal (Lower):   Patient denies diarrhea and constipation.  Constitutional:   Patient denies fever, night sweats, weight loss, and fatigue.  Skin:   Patient denies skin rash/ lesion and itching.  Eyes:   Patient denies blurred vision and double vision.  Ears/ Nose/ Throat:   Patient denies sore throat and sinus problems.  Hematologic/Lymphatic:   Patient denies swollen glands and easy bruising.  Cardiovascular:   Patient denies leg swelling and chest pains.  Respiratory:   Patient denies cough and shortness of breath.  Endocrine:   Patient denies excessive thirst.  Musculoskeletal:   left flank and bladder . Patient reports back pain. Patient denies joint pain.  Neurological:   Patient denies headaches and dizziness.  Psychologic:   Patient denies depression and anxiety.   VITAL SIGNS:      10/24/2019 10:32 AM  BP 143/81 mmHg  Pulse 80 /min  Temperature 97.1 F / 36.1 C   MULTI-SYSTEM PHYSICAL EXAMINATION:    Constitutional: Well-nourished. No physical deformities. Normally developed. Good grooming.  Neck: Neck symmetrical, not swollen. Normal tracheal position.  Respiratory: No labored breathing, no use of accessory muscles.    Cardiovascular: Normal temperature, normal extremity pulses, no swelling, no varicosities.  Neurologic / Psychiatric: Oriented to time, oriented to place, oriented to person. No depression, no anxiety, no agitation.  Gastrointestinal: No mass, no tenderness, no rigidity, non obese abdomen.     Complexity of Data:  X-Ray Review: C.T. Abdomen/Pelvis: Reviewed Films. Discussed With Patient. apr 2021     PROCEDURES:         PVR Ultrasound - 51798  Scanned Volume: 97 cc         Urinalysis w/Scope Micro  WBC/hpf: 6 - 10/hpf  RBC/hpf: >60/hpf  Bacteria: Few (10-25/hpf)  Cystals: NS (Not Seen)  Casts: NS (Not Seen)  Trichomonas: Not Present  Mucous: Not Present  Epithelial Cells: 6 - 10/hpf  Yeast: NS (Not Seen)  Sperm: Not Present    ASSESSMENT:      ICD-10 Details  1 GU:   Ureteral obstruction - N13.1 Chronic, Worsening - cytology pending, but discussed the nature r/b/a to repeat cysto, left RGP, left URS, bx, stent.  All questions answered. She elects to proceed.   2   Dysuria - R30.0 Chronic, Worsening - on oxyb.   3   LLQ pain - R10.32 Chronic, Worsening - Left ureteral enhancement on CT but no other pathology (bowel, msk, adnexa, etc).    PLAN:            Medications New Meds: Oxycodone-Acetaminophen 5 mg-325 mg tablet 1 tablet PO Q 6 H PRN   #20  0 Refill(s)    Stop Meds: Oxycodone-Acetaminophen 5 mg-325 mg tablet 1 tablet PO Q 6 H PRN  Start: 10/05/2019  Discontinue: 10/24/2019  - Reason: The medication cycle was completed.            Orders Labs Urine Culture          Schedule Return Visit/Planned Activity: Next Available Appointment - Schedule Surgery          Document Letter(s):  Created for Patient: Clinical Summary         Next Appointment:      Next Appointment: 11/14/2019 08:15 AM    Appointment Type: Surgery     Location: [facility] [phone]    Provider: Festus Aloe, M.D.    Reason for Visit: WL/OP CYSTO, LEFT RGP, LT  US BIOPSY, STENT      * Signed by Festus Aloe, M.D. on 11/12/19 at 4:44 PM (EDT)*     The information contained in this medical record document is considered private and confidential patient information. This information can only be used for the medical diagnosis and/or medical services that are being provided by the patient's selected caregivers. This information can only be distributed outside of the patient's care if the patient agrees and signs waivers of authorization for this information to be sent to an outside source or route.

## 2019-11-13 NOTE — Anesthesia Preprocedure Evaluation (Addendum)
Anesthesia Evaluation  Patient identified by MRN, date of birth, ID band Patient awake    Reviewed: Allergy & Precautions, NPO status , Patient's Chart, lab work & pertinent test results  Airway Mallampati: II  TM Distance: >3 FB Neck ROM: Full    Dental  (+) Dental Advisory Given, Edentulous Lower, Edentulous Upper   Pulmonary neg pulmonary ROS,    Pulmonary exam normal breath sounds clear to auscultation       Cardiovascular hypertension, Pt. on medications (-) angina(-) CAD and (-) Past MI Normal cardiovascular exam Rhythm:Regular Rate:Normal     Neuro/Psych PSYCHIATRIC DISORDERS Anxiety Depression  Neuromuscular disease    GI/Hepatic Neg liver ROS, GERD  Medicated and Controlled,  Endo/Other  diabetes, Type 2, Oral Hypoglycemic AgentsHypothyroidism Obesity   Renal/GU Renal disease     Musculoskeletal  (+) Arthritis , Fibromyalgia -  Abdominal   Peds  Hematology negative hematology ROS (+)   Anesthesia Other Findings   Reproductive/Obstetrics                            Anesthesia Physical Anesthesia Plan  ASA: III  Anesthesia Plan: General   Post-op Pain Management:    Induction:   PONV Risk Score and Plan: 4 or greater and Dexamethasone, Ondansetron and Treatment may vary due to age or medical condition  Airway Management Planned: LMA  Additional Equipment:   Intra-op Plan:   Post-operative Plan: Extubation in OR  Informed Consent: I have reviewed the patients History and Physical, chart, labs and discussed the procedure including the risks, benefits and alternatives for the proposed anesthesia with the patient or authorized representative who has indicated his/her understanding and acceptance.     Dental advisory given  Plan Discussed with: CRNA  Anesthesia Plan Comments:        Anesthesia Quick Evaluation

## 2019-11-14 ENCOUNTER — Ambulatory Visit (HOSPITAL_COMMUNITY): Payer: Medicare Other

## 2019-11-14 ENCOUNTER — Ambulatory Visit (HOSPITAL_COMMUNITY)
Admission: RE | Admit: 2019-11-14 | Discharge: 2019-11-14 | Disposition: A | Discharge status: Home / Self Care | Payer: Medicare Other | Source: Other Acute Inpatient Hospital | Attending: Urology | Admitting: Urology

## 2019-11-14 ENCOUNTER — Ambulatory Visit (HOSPITAL_COMMUNITY): Payer: Medicare Other | Admitting: Certified Registered"

## 2019-11-14 ENCOUNTER — Encounter (HOSPITAL_COMMUNITY)
Admission: RE | Disposition: A | Discharge status: Home / Self Care | Payer: Self-pay | Source: Other Acute Inpatient Hospital | Attending: Urology

## 2019-11-14 DIAGNOSIS — N2889 Other specified disorders of kidney and ureter: Secondary | ICD-10-CM | POA: Diagnosis not present

## 2019-11-14 DIAGNOSIS — E119 Type 2 diabetes mellitus without complications: Secondary | ICD-10-CM | POA: Insufficient documentation

## 2019-11-14 DIAGNOSIS — E039 Hypothyroidism, unspecified: Secondary | ICD-10-CM | POA: Diagnosis not present

## 2019-11-14 DIAGNOSIS — K219 Gastro-esophageal reflux disease without esophagitis: Secondary | ICD-10-CM | POA: Diagnosis not present

## 2019-11-14 DIAGNOSIS — Z79890 Hormone replacement therapy (postmenopausal): Secondary | ICD-10-CM | POA: Diagnosis not present

## 2019-11-14 DIAGNOSIS — Z79899 Other long term (current) drug therapy: Secondary | ICD-10-CM | POA: Insufficient documentation

## 2019-11-14 DIAGNOSIS — M797 Fibromyalgia: Secondary | ICD-10-CM | POA: Insufficient documentation

## 2019-11-14 DIAGNOSIS — I10 Essential (primary) hypertension: Secondary | ICD-10-CM | POA: Diagnosis not present

## 2019-11-14 DIAGNOSIS — Z8249 Family history of ischemic heart disease and other diseases of the circulatory system: Secondary | ICD-10-CM | POA: Diagnosis not present

## 2019-11-14 DIAGNOSIS — N1330 Unspecified hydronephrosis: Secondary | ICD-10-CM | POA: Insufficient documentation

## 2019-11-14 DIAGNOSIS — N135 Crossing vessel and stricture of ureter without hydronephrosis: Secondary | ICD-10-CM | POA: Diagnosis not present

## 2019-11-14 DIAGNOSIS — E782 Mixed hyperlipidemia: Secondary | ICD-10-CM | POA: Diagnosis not present

## 2019-11-14 DIAGNOSIS — F419 Anxiety disorder, unspecified: Secondary | ICD-10-CM | POA: Diagnosis not present

## 2019-11-14 DIAGNOSIS — N952 Postmenopausal atrophic vaginitis: Secondary | ICD-10-CM | POA: Diagnosis not present

## 2019-11-14 DIAGNOSIS — F329 Major depressive disorder, single episode, unspecified: Secondary | ICD-10-CM | POA: Insufficient documentation

## 2019-11-14 DIAGNOSIS — N2886 Ureteritis cystica: Secondary | ICD-10-CM | POA: Diagnosis not present

## 2019-11-14 HISTORY — PX: CYSTOSCOPY/RETROGRADE/URETEROSCOPY: SHX5316

## 2019-11-14 LAB — GLUCOSE, CAPILLARY
Glucose-Capillary: 139 mg/dL — ABNORMAL HIGH (ref 70–99)
Glucose-Capillary: 131 mg/dL — ABNORMAL HIGH (ref 70–99)

## 2019-11-14 SURGERY — CYSTOSCOPY/RETROGRADE/URETEROSCOPY
Anesthesia: General | Laterality: Left

## 2019-11-14 MED ORDER — OXYCODONE HCL 5 MG PO TABS
ORAL_TABLET | ORAL | Status: AC
  Filled 2019-11-14: qty 1

## 2019-11-14 MED ORDER — CEFAZOLIN SODIUM-DEXTROSE 2-4 GM/100ML-% IV SOLN
2.0000 g | Freq: Once | INTRAVENOUS | Status: AC
  Administered 2019-11-14: 2 g via INTRAVENOUS
  Filled 2019-11-14: qty 100

## 2019-11-14 MED ORDER — DEXAMETHASONE SODIUM PHOSPHATE 10 MG/ML IJ SOLN
INTRAMUSCULAR | Status: DC | PRN
  Administered 2019-11-14: 10 mg via INTRAVENOUS

## 2019-11-14 MED ORDER — OXYCODONE-ACETAMINOPHEN 5-325 MG PO TABS
ORAL_TABLET | ORAL | Status: AC
  Filled 2019-11-14: qty 1

## 2019-11-14 MED ORDER — LACTATED RINGERS IV SOLN: INTRAVENOUS | Status: DC

## 2019-11-14 MED ORDER — LIDOCAINE HCL (CARDIAC) PF 100 MG/5ML IV SOSY
PREFILLED_SYRINGE | INTRAVENOUS | Status: DC | PRN
  Administered 2019-11-14: 80 mg via INTRAVENOUS

## 2019-11-14 MED ORDER — PHENYLEPHRINE 40 MCG/ML (10ML) SYRINGE FOR IV PUSH (FOR BLOOD PRESSURE SUPPORT)
PREFILLED_SYRINGE | INTRAVENOUS | Status: AC
  Filled 2019-11-14: qty 10

## 2019-11-14 MED ORDER — FENTANYL CITRATE (PF) 100 MCG/2ML IJ SOLN
INTRAMUSCULAR | Status: DC | PRN
  Administered 2019-11-14 (×4): 25 ug via INTRAVENOUS

## 2019-11-14 MED ORDER — LIDOCAINE HCL URETHRAL/MUCOSAL 2 % EX GEL
CUTANEOUS | Status: DC | PRN
  Administered 2019-11-14: 1 via URETHRAL

## 2019-11-14 MED ORDER — GLYCOPYRROLATE PF 0.2 MG/ML IJ SOSY
PREFILLED_SYRINGE | INTRAMUSCULAR | Status: AC
  Filled 2019-11-14: qty 1

## 2019-11-14 MED ORDER — MIDAZOLAM HCL 2 MG/2ML IJ SOLN
INTRAMUSCULAR | Status: AC
  Filled 2019-11-14: qty 2

## 2019-11-14 MED ORDER — OXYCODONE-ACETAMINOPHEN 5-325 MG PO TABS
1.0000 | ORAL_TABLET | Freq: Once | ORAL | Status: AC
  Administered 2019-11-14: 1 via ORAL

## 2019-11-14 MED ORDER — OXYBUTYNIN CHLORIDE 5 MG PO TABS
5.0000 mg | ORAL_TABLET | Freq: Once | ORAL | Status: AC
  Administered 2019-11-14: 5 mg via ORAL

## 2019-11-14 MED ORDER — ESMOLOL HCL 100 MG/10ML IV SOLN
INTRAVENOUS | Status: AC
  Filled 2019-11-14: qty 10

## 2019-11-14 MED ORDER — OXYBUTYNIN CHLORIDE 5 MG PO TABS
ORAL_TABLET | ORAL | Status: AC
  Filled 2019-11-14: qty 1

## 2019-11-14 MED ORDER — IOHEXOL 300 MG/ML  SOLN
INTRAMUSCULAR | Status: DC | PRN
  Administered 2019-11-14: 10 mL

## 2019-11-14 MED ORDER — EPHEDRINE SULFATE 50 MG/ML IJ SOLN
INTRAMUSCULAR | Status: DC | PRN
  Administered 2019-11-14: 10 mg via INTRAVENOUS

## 2019-11-14 MED ORDER — ONDANSETRON HCL 4 MG/2ML IJ SOLN
INTRAMUSCULAR | Status: DC | PRN
  Administered 2019-11-14: 4 mg via INTRAVENOUS

## 2019-11-14 MED ORDER — OXYCODONE HCL 5 MG PO TABS
5.0000 mg | ORAL_TABLET | Freq: Once | ORAL | Status: AC | PRN
  Administered 2019-11-14: 5 mg via ORAL

## 2019-11-14 MED ORDER — LACTATED RINGERS IV SOLN: INTRAVENOUS | Status: DC | PRN

## 2019-11-14 MED ORDER — MIDAZOLAM HCL 5 MG/5ML IJ SOLN
INTRAMUSCULAR | Status: DC | PRN
  Administered 2019-11-14 (×2): 1 mg via INTRAVENOUS

## 2019-11-14 MED ORDER — PROPOFOL 10 MG/ML IV BOLUS
INTRAVENOUS | Status: DC | PRN
  Administered 2019-11-14: 140 mg via INTRAVENOUS

## 2019-11-14 MED ORDER — LIDOCAINE HCL URETHRAL/MUCOSAL 2 % EX GEL
CUTANEOUS | Status: AC
  Filled 2019-11-14: qty 30

## 2019-11-14 MED ORDER — ONDANSETRON HCL 4 MG/2ML IJ SOLN: 4.0000 mg | Freq: Once | INTRAMUSCULAR | Status: DC | PRN

## 2019-11-14 MED ORDER — ACETAMINOPHEN 500 MG PO TABS
1000.0000 mg | ORAL_TABLET | Freq: Once | ORAL | Status: AC
  Administered 2019-11-14: 1000 mg via ORAL
  Filled 2019-11-14: qty 2

## 2019-11-14 MED ORDER — EPHEDRINE 5 MG/ML INJ
INTRAVENOUS | Status: AC
  Filled 2019-11-14: qty 10

## 2019-11-14 MED ORDER — FENTANYL CITRATE (PF) 100 MCG/2ML IJ SOLN
INTRAMUSCULAR | Status: AC
  Filled 2019-11-14: qty 2

## 2019-11-14 MED ORDER — FENTANYL CITRATE (PF) 100 MCG/2ML IJ SOLN: 25.0000 ug | INTRAMUSCULAR | Status: DC | PRN

## 2019-11-14 MED ORDER — PHENYLEPHRINE HCL (PRESSORS) 10 MG/ML IV SOLN
INTRAVENOUS | Status: DC | PRN
  Administered 2019-11-14 (×3): 50 ug via INTRAVENOUS

## 2019-11-14 MED ORDER — GLYCOPYRROLATE 0.2 MG/ML IJ SOLN
INTRAMUSCULAR | Status: DC | PRN
  Administered 2019-11-14: .1 mg via INTRAVENOUS

## 2019-11-14 MED ORDER — PROPOFOL 10 MG/ML IV BOLUS
INTRAVENOUS | Status: AC
  Filled 2019-11-14: qty 40

## 2019-11-14 SURGICAL SUPPLY — 20 items
BAG URO CATCHER STRL LF (MISCELLANEOUS) ×3 IMPLANT
BASKET ZERO TIP NITINOL 2.4FR (BASKET) ×3 IMPLANT
BRUSH URET BIOPSY 3F (UROLOGICAL SUPPLIES) ×2 IMPLANT
BSKT STON RTRVL ZERO TP 2.4FR (BASKET) ×1
CATH INTERMIT  6FR 70CM (CATHETERS) ×6 IMPLANT
CATH URET 5FR 28IN CONE TIP (BALLOONS) ×3
CATH URET 5FR 70CM CONE TIP (BALLOONS) ×1 IMPLANT
CLOTH BEACON ORANGE TIMEOUT ST (SAFETY) ×3 IMPLANT
GLOVE BIO SURGEON STRL SZ7.5 (GLOVE) ×3 IMPLANT
GOWN STRL REUS W/TWL XL LVL3 (GOWN DISPOSABLE) ×3 IMPLANT
GUIDEWIRE STR DUAL SENSOR (WIRE) ×3 IMPLANT
KIT TURNOVER KIT A (KITS) IMPLANT
MANIFOLD NEPTUNE II (INSTRUMENTS) ×3 IMPLANT
SHEATH URETERAL 12FRX28CM (UROLOGICAL SUPPLIES) ×3 IMPLANT
SHEATH URETERAL 12FRX35CM (MISCELLANEOUS) ×3 IMPLANT
STENT URET 6FRX24 CONTOUR (STENTS) ×2 IMPLANT
TRAY CYSTO PACK (CUSTOM PROCEDURE TRAY) ×3 IMPLANT
TUBING CONNECTING 10 (TUBING) ×2 IMPLANT
TUBING CONNECTING 10' (TUBING) ×1
TUBING UROLOGY SET (TUBING) ×3 IMPLANT

## 2019-11-14 NOTE — Interval H&P Note (Signed)
History and Physical Interval Note:  11/14/2019 8:14 AM  Miranda Mathis  has presented today for surgery, with the diagnosis of LEFT URETERAL OBSTRUCTION.  The various methods of treatment have been discussed with the patient and family. After consideration of risks, benefits and other options for treatment, the patient has consented to  Procedure(s): CYSTOSCOPY/RETROGRADE/URETEROSCOPY/ BIOPSY/ STENT PLACEMENT (Left) as a surgical intervention.  The patient's history has been reviewed, patient examined, no change in status, stable for surgery. No dysuria or fever but continued "shooting pain" LLQ. No hematuria. I have reviewed the patient's chart and labs.  Questions were answered to the patient's satisfaction.      Festus Aloe

## 2019-11-14 NOTE — Transfer of Care (Signed)
Immediate Anesthesia Transfer of Care Note  Patient: Miranda Mathis  Procedure(s) Performed: CYSTOSCOPY/RETROGRADE/URETEROSCOPY/ BIOPSY/ STENT PLACEMENT (Left )  Patient Location: PACU  Anesthesia Type:General  Level of Consciousness: awake, alert  and oriented  Airway & Oxygen Therapy: Patient Spontanous Breathing and Patient connected to nasal cannula oxygen  Post-op Assessment: Report given to RN and Post -op Vital signs reviewed and stable  Post vital signs: Reviewed and stable  Last Vitals:  Vitals Value Taken Time  BP 133/61 11/14/19 0930  Temp 36.5 C 11/14/19 0925  Pulse 88 11/14/19 0937  Resp 11 11/14/19 0937  SpO2 92 % 11/14/19 0937  Vitals shown include unvalidated device data.  Last Pain:  Vitals:   11/14/19 0930  TempSrc:   PainSc: 4          Complications: No apparent anesthesia complications

## 2019-11-14 NOTE — Anesthesia Procedure Notes (Signed)
Procedure Name: LMA Insertion Date/Time: 11/14/2019 8:29 AM Performed by: Adalberto Ill, CRNA Pre-anesthesia Checklist: Patient identified, Emergency Drugs available, Suction available, Patient being monitored and Timeout performed Patient Re-evaluated:Patient Re-evaluated prior to induction Oxygen Delivery Method: Circle system utilized Preoxygenation: Pre-oxygenation with 100% oxygen Induction Type: IV induction LMA: LMA inserted LMA Size: 4.0 Number of attempts: 1 (brief atraumatic ) Placement Confirmation: positive ETCO2 and breath sounds checked- equal and bilateral ETT to lip (cm): yes. Tube secured with: Tape Dental Injury: Teeth and Oropharynx as per pre-operative assessment

## 2019-11-14 NOTE — Anesthesia Postprocedure Evaluation (Signed)
Anesthesia Post Note  Patient: Miranda Mathis  Procedure(s) Performed: CYSTOSCOPY/RETROGRADE/URETEROSCOPY/ BIOPSY/ STENT PLACEMENT (Left )     Patient location during evaluation: PACU Anesthesia Type: General Level of consciousness: awake and alert Pain management: pain level controlled Vital Signs Assessment: post-procedure vital signs reviewed and stable Respiratory status: spontaneous breathing, nonlabored ventilation, respiratory function stable and patient connected to nasal cannula oxygen Cardiovascular status: blood pressure returned to baseline and stable Postop Assessment: no apparent nausea or vomiting Anesthetic complications: no    Last Vitals:  Vitals:   11/14/19 1030 11/14/19 1042  BP: 117/60 115/88  Pulse: 72 76  Resp: 18 16  Temp: 36.7 C 36.8 C  SpO2: 91% 94%    Last Pain:  Vitals:   11/14/19 1130  TempSrc:   PainSc: [place]

## 2019-11-14 NOTE — Discharge Instructions (Signed)
Ureteral Stent Implantation, Care After This sheet gives you information about how to care for yourself after your procedure. Your health care provider may also give you more specific instructions. If you have problems or questions, contact your health care provider. What can I expect after the procedure? After the procedure, it is common to have:  Nausea.  Mild pain when you urinate. You may feel this pain in your lower back or lower abdomen. The pain should stop within a few minutes after you urinate. This may last for up to 1 week.  A small amount of blood in your urine for several days. Follow these instructions at home: Medicines  Take over-the-counter and prescription medicines only as told by your health care provider.  If you were prescribed an antibiotic medicine, take it as told by your health care provider. Do not stop taking the antibiotic even if you start to feel better.  Do not drive for 24 hours if you were given a sedative during your procedure.  Ask your health care provider if the medicine prescribed to you requires you to avoid driving or using heavy machinery. Activity  Rest as told by your health care provider.  Avoid sitting for a long time without moving. Get up to take short walks every 1-2 hours. This is important to improve blood flow and breathing. Ask for help if you feel weak or unsteady.  Return to your normal activities as told by your health care provider. Ask your health care provider what activities are safe for you. General instructions   Watch for any blood in your urine. Call your health care provider if the amount of blood in your urine increases.  If you have a catheter: ? Follow instructions from your health care provider about taking care of your catheter and collection bag. ? Do not take baths, swim, or use a hot tub until your health care provider approves. Ask your health care provider if you may take showers. You may only be allowed to  take sponge baths.  Drink enough fluid to keep your urine pale yellow.  Do not use any products that contain nicotine or tobacco, such as cigarettes, e-cigarettes, and chewing tobacco. These can delay healing after surgery. If you need help quitting, ask your health care provider.  Keep all follow-up visits as told by your health care provider. This is important. Contact a health care provider if:  You have pain that gets worse or does not get better with medicine, especially pain when you urinate.  You have difficulty urinating.  You feel nauseous or you vomit repeatedly during a period of more than 2 days after the procedure. Get help right away if:  Your urine is dark red or has blood clots in it.  You are leaking urine (have incontinence).  The end of the stent comes out of your urethra.  You cannot urinate.  You have sudden, sharp, or severe pain in your abdomen or lower back.  You have a fever.  You have swelling or pain in your legs.  You have difficulty breathing. Summary  After the procedure, it is common to have mild pain when you urinate that goes away within a few minutes after you urinate. This may last for up to 1 week.  Watch for any blood in your urine. Call your health care provider if the amount of blood in your urine increases.  Take over-the-counter and prescription medicines only as told by your health care provider.  Drink   enough fluid to keep your urine pale yellow. This information is not intended to replace advice given to you by your health care provider. Make sure you discuss any questions you have with your health care provider. Document Revised: 03/15/2018 Document Reviewed: 03/16/2018 Elsevier Patient Education  2020 [employer]  General Anesthesia, Adult, Care After This sheet gives you information about how to care for yourself after your procedure. Your health care provider may also give you more specific instructions. If you have  problems or questions, contact your health care provider. What can I expect after the procedure? After the procedure, the following side effects are common:  Pain or discomfort at the IV site.  Nausea.  Vomiting.  Sore throat.  Trouble concentrating.  Feeling cold or chills.  Weak or tired.  Sleepiness and fatigue.  Soreness and body aches. These side effects can affect parts of the body that were not involved in surgery. Follow these instructions at home:  For at least 24 hours after the procedure:  Have a responsible adult stay with you. It is important to have someone help care for you until you are awake and alert.  Rest as needed.  Do not: ? Participate in activities in which you could fall or become injured. ? Drive. ? Use heavy machinery. ? Drink alcohol. ? Take sleeping pills or medicines that cause drowsiness. ? Make important decisions or sign legal documents. ? Take care of children on your own. Eating and drinking  Follow any instructions from your health care provider about eating or drinking restrictions.  When you feel hungry, start by eating small amounts of foods that are soft and easy to digest (bland), such as toast. Gradually return to your regular diet.  Drink enough fluid to keep your urine pale yellow.  If you vomit, rehydrate by drinking water, juice, or clear broth. General instructions  If you have sleep apnea, surgery and certain medicines can increase your risk for breathing problems. Follow instructions from your health care provider about wearing your sleep device: ? Anytime you are sleeping, including during daytime naps. ? While taking prescription pain medicines, sleeping medicines, or medicines that make you drowsy.  Return to your normal activities as told by your health care provider. Ask your health care provider what activities are safe for you.  Take over-the-counter and prescription medicines only as told by your health  care provider.  If you smoke, do not smoke without supervision.  Keep all follow-up visits as told by your health care provider. This is important. Contact a health care provider if:  You have nausea or vomiting that does not get better with medicine.  You cannot eat or drink without vomiting.  You have pain that does not get better with medicine.  You are unable to pass urine.  You develop a skin rash.  You have a fever.  You have redness around your IV site that gets worse. Get help right away if:  You have difficulty breathing.  You have chest pain.  You have blood in your urine or stool, or you vomit blood. Summary  After the procedure, it is common to have a sore throat or nausea. It is also common to feel tired.  Have a responsible adult stay with you for the first 24 hours after general anesthesia. It is important to have someone help care for you until you are awake and alert.  When you feel hungry, start by eating small amounts of foods that are soft   and easy to digest (bland), such as toast. Gradually return to your regular diet.  Drink enough fluid to keep your urine pale yellow.  Return to your normal activities as told by your health care provider. Ask your health care provider what activities are safe for you. This information is not intended to replace advice given to you by your health care provider. Make sure you discuss any questions you have with your health care provider. Document Revised: 06/11/2017 Document Reviewed: 01/22/2017 Elsevier Patient Education  2020 [employer]  

## 2019-11-14 NOTE — Op Note (Signed)
Preoperative diagnosis: Left ureteral enhancement, left hydronephrosis Postoperative diagnosis: Left ureteritis, left hydronephrosis  Procedure: Cystoscopy, left retrograde pyelogram, left ureteroscopy, diagnostic with brush biopsy of ureter, left ureteral stent placement  Surgeon: Junious Silk  Anesthesia: General  Indication for procedure: Miranda Mathis is a 69 year old female with a history of left flank pain and left ureteritis requiring ureteral stent in 2020.  She had recurrent symptoms earlier this year with CT scan revealing enhancement of the left proximal ureter and thickening of the mid to proximal ureter.  She was brought today for diagnostic evaluation.  Findings: On exam under anesthesia there is vaginal atrophy but no vaginal mass.  Introitus appeared normal.  Meatus appeared normal.  The bladder and urethra were palpably normal.  On cystoscopy the urethra and the bladder were unremarkable.  Clear right E flux.  Left E flux not visualized.  Trigone and ureteral orifice ease were in the normal orthotopic position.  Left retrograde pyelogram-this outlined a single ureter single collecting system unit.  The left ureter was focally narrow along the distal to mid ureter with a focal transition point in the proximal ureter here with widening to dilation of the ureter and collecting system of a moderate degree.  No filling defects.  No medial deviation.  On ureteroscopy left distal ureter appeared normal with some small raised sporadic mucosal small bumps consistent with ureteritis cystica and then in the mid to proximal ureter at the transition point the ureter at some erythema and edema and was friable.  Mid to proximal ureter at the transition point bled easily when distended and then drained.  This area circumferentially brush biopsy.  Proximal to the transition point the ureter was dilated but smooth mucosa and I was able to view all the way up to the UPJ which appeared normal.  The dual channel  scope was used and it was tight and used to dilate the mid to proximal ureter.  On the way out, ureter with same findings but no injury or mucosal disruption.  Description of procedure: After consent was obtained patient brought to the operating room.  After adequate anesthesia she was placed lithotomy position and prepped and draped in the usual sterile fashion.  Timeout was performed to confirm the patient and procedure.  Bladder carefully inspected.  Left ureteral orifice was cannulated with 6 [place] open-ended catheter and left retrograde injection of contrast was performed.  I then used the dual channel semirigid ureteroscope to do a no touch technique and inspect the left ureter.  At the transition point where the ureter was edematous and erythematous corresponded to the thickened area on the CT.  This was brush biopsy circumferentially and a length of 1 to 2 cm.  Brush was sent for pathology.  Although narrow again, it accommodated the dual channel semirigid.  Continue dilate slightly more aggressively I passed a sensor wire up into the collecting system and then passed the semirigid up to the left UPJ.  This was not difficult but there was some resistance especially when removing the scope and I slowly backed out without any difficulty.  I then backloaded the wire on the cystoscope and a 6 x 24 cm stent was advanced.  The wire was removed with a good coil seen in the upper pole calyx good coil in the bladder.  Bladder was drained and the scope removed.  Lidocaine jelly instilled per urethra.  She was awakened taken recovery room in stable condition.  Complications: None  Blood loss: Minimal  Specimens  to pathology: Left ureteral brush biopsy  Drains: 6 x 24 cm left ureteral stent  Disposition: Patient stable to PACU

## 2019-11-15 ENCOUNTER — Encounter: Payer: Self-pay | Admitting: *Deleted

## 2019-11-16 LAB — CYTOLOGY - NON PAP

## 2019-11-17 ENCOUNTER — Ambulatory Visit: Payer: Medicare Other | Admitting: Physician Assistant

## 2019-11-19 ENCOUNTER — Emergency Department (HOSPITAL_COMMUNITY): Payer: Medicare Other

## 2019-11-19 ENCOUNTER — Other Ambulatory Visit: Payer: Self-pay

## 2019-11-19 ENCOUNTER — Emergency Department (HOSPITAL_COMMUNITY)
Admission: EM | Admit: 2019-11-19 | Discharge: 2019-11-19 | Disposition: A | Discharge status: Home / Self Care | Payer: Medicare Other | Attending: Emergency Medicine | Admitting: Emergency Medicine

## 2019-11-19 DIAGNOSIS — R0902 Hypoxemia: Secondary | ICD-10-CM | POA: Diagnosis not present

## 2019-11-19 DIAGNOSIS — Z7984 Long term (current) use of oral hypoglycemic drugs: Secondary | ICD-10-CM | POA: Insufficient documentation

## 2019-11-19 DIAGNOSIS — R109 Unspecified abdominal pain: Secondary | ICD-10-CM | POA: Diagnosis not present

## 2019-11-19 DIAGNOSIS — Z79899 Other long term (current) drug therapy: Secondary | ICD-10-CM | POA: Diagnosis not present

## 2019-11-19 DIAGNOSIS — R1084 Generalized abdominal pain: Secondary | ICD-10-CM | POA: Diagnosis not present

## 2019-11-19 DIAGNOSIS — M797 Fibromyalgia: Secondary | ICD-10-CM | POA: Insufficient documentation

## 2019-11-19 DIAGNOSIS — I10 Essential (primary) hypertension: Secondary | ICD-10-CM | POA: Diagnosis not present

## 2019-11-19 DIAGNOSIS — E119 Type 2 diabetes mellitus without complications: Secondary | ICD-10-CM | POA: Insufficient documentation

## 2019-11-19 DIAGNOSIS — K5730 Diverticulosis of large intestine without perforation or abscess without bleeding: Secondary | ICD-10-CM | POA: Diagnosis not present

## 2019-11-19 DIAGNOSIS — R0989 Other specified symptoms and signs involving the circulatory and respiratory systems: Secondary | ICD-10-CM | POA: Diagnosis not present

## 2019-11-19 DIAGNOSIS — R52 Pain, unspecified: Secondary | ICD-10-CM | POA: Diagnosis not present

## 2019-11-19 LAB — COMPREHENSIVE METABOLIC PANEL
ALT: 33 U/L (ref 0–44)
AST: 24 U/L (ref 15–41)
Albumin: 3.9 g/dL (ref 3.5–5.0)
Alkaline Phosphatase: 55 U/L (ref 38–126)
Anion gap: 9 (ref 5–15)
BUN: 19 mg/dL (ref 8–23)
CO2: 26 mmol/L (ref 22–32)
Calcium: 9 mg/dL (ref 8.9–10.3)
Chloride: 106 mmol/L (ref 98–111)
Creatinine, Ser: 1.07 mg/dL — ABNORMAL HIGH (ref 0.44–1.00)
GFR calc Af Amer: >60 mL/min (ref >60)
GFR calc non Af Amer: 53 mL/min — ABNORMAL LOW (ref >60)
Glucose, Bld: 151 mg/dL — ABNORMAL HIGH (ref 70–99)
Potassium: 4.6 mmol/L (ref 3.5–5.1)
Sodium: 141 mmol/L (ref 135–145)
Total Bilirubin: 0.5 mg/dL (ref 0.3–1.2)
Total Protein: 7.3 g/dL (ref 6.5–8.1)

## 2019-11-19 LAB — CBC WITH DIFFERENTIAL/PLATELET
Abs Immature Granulocytes: 0.03 10*3/uL (ref 0.00–0.07)
Basophils Absolute: 0 10*3/uL (ref 0.0–0.1)
Basophils Relative: 0 %
Eosinophils Absolute: 0.4 10*3/uL (ref 0.0–0.5)
Eosinophils Relative: 5 %
HCT: 44.3 % (ref 36.0–46.0)
Hemoglobin: 13.7 g/dL (ref 12.0–15.0)
Immature Granulocytes: 0 %
Lymphocytes Relative: 14 %
Lymphs Abs: 1 10*3/uL (ref 0.7–4.0)
MCH: 27.8 pg (ref 26.0–34.0)
MCHC: 30.9 g/dL (ref 30.0–36.0)
MCV: 90 fL (ref 80.0–100.0)
Monocytes Absolute: 0.4 10*3/uL (ref 0.1–1.0)
Monocytes Relative: 6 %
Neutro Abs: 5.2 10*3/uL (ref 1.7–7.7)
Neutrophils Relative %: 75 %
Platelets: 204 10*3/uL (ref 150–400)
RBC: 4.92 MIL/uL (ref 3.87–5.11)
RDW: 14.5 % (ref 11.5–15.5)
WBC: 7 10*3/uL (ref 4.0–10.5)
nRBC: 0 % (ref 0.0–0.2)

## 2019-11-19 LAB — URINALYSIS, ROUTINE W REFLEX MICROSCOPIC
Bacteria, UA: NONE SEEN
Bilirubin Urine: NEGATIVE
Glucose, UA: NEGATIVE mg/dL
Ketones, ur: NEGATIVE mg/dL
Nitrite: NEGATIVE
Protein, ur: 100 mg/dL — AB
Specific Gravity, Urine: 1.011 (ref 1.005–1.030)
pH: 7 (ref 5.0–8.0)

## 2019-11-19 LAB — LIPASE, BLOOD: Lipase: 18 U/L (ref 11–51)

## 2019-11-19 MED ORDER — ONDANSETRON 4 MG PO TBDP
4.0000 mg | ORAL_TABLET | Freq: Three times a day (TID) | ORAL | 0 refills | Status: DC | PRN
Start: 2019-11-19 — End: 2020-06-08

## 2019-11-19 MED ORDER — IOHEXOL 300 MG/ML  SOLN
100.0000 mL | Freq: Once | INTRAMUSCULAR | Status: AC | PRN
  Administered 2019-11-19: 100 mL via INTRAVENOUS

## 2019-11-19 MED ORDER — SODIUM CHLORIDE (PF) 0.9 % IJ SOLN
INTRAMUSCULAR | Status: AC
  Filled 2019-11-19: qty 50

## 2019-11-19 MED ORDER — IBUPROFEN 400 MG PO TABS
400.0000 mg | ORAL_TABLET | Freq: Four times a day (QID) | ORAL | 0 refills | Status: DC | PRN
Start: 2019-11-19 — End: 2020-04-22

## 2019-11-19 MED ORDER — KETOROLAC TROMETHAMINE 30 MG/ML IJ SOLN
15.0000 mg | Freq: Once | INTRAMUSCULAR | Status: DC
  Filled 2019-11-19: qty 1

## 2019-11-19 MED ORDER — HYDROMORPHONE HCL 1 MG/ML IJ SOLN
0.5000 mg | Freq: Once | INTRAMUSCULAR | Status: AC
  Administered 2019-11-19: 0.5 mg via INTRAVENOUS
  Filled 2019-11-19: qty 1

## 2019-11-19 MED ORDER — ONDANSETRON HCL 4 MG/2ML IJ SOLN
4.0000 mg | Freq: Once | INTRAMUSCULAR | Status: AC
  Administered 2019-11-19: 4 mg via INTRAVENOUS
  Filled 2019-11-19: qty 2

## 2019-11-19 MED ORDER — TAMSULOSIN HCL 0.4 MG PO CAPS: 0.4000 mg | ORAL_CAPSULE | Freq: Every day | ORAL | 0 refills | Status: DC

## 2019-11-19 MED ORDER — OXYCODONE-ACETAMINOPHEN 5-325 MG PO TABS: 1.0000 | ORAL_TABLET | Freq: Three times a day (TID) | ORAL | 0 refills | Status: DC | PRN

## 2019-11-19 MED ORDER — HYDROMORPHONE HCL 1 MG/ML IJ SOLN: 1.0000 mg | Freq: Once | INTRAMUSCULAR | Status: DC

## 2019-11-19 NOTE — ED Notes (Signed)
Patient denies pain and is resting comfortably.  

## 2019-11-19 NOTE — Discharge Instructions (Addendum)
1. Medications: Take 400 mg of ibuprofen every 6 hours as needed for pain.  Take this with food to avoid upset stomach issues.  If you develop any concerning signs or symptoms such as upper abdominal pain, indigestion, vomiting blood or other concerning symptoms then stop taking ibuprofen. You can take percocet  as needed for severe pain but do not drive, drink alcohol, or operate heavy machinery while taking this medicine as it can cause drowsiness.  Be aware this medicine also contains Tylenol and do not exceed more than 4000 mg of Tylenol daily. Take Zofran as needed for nausea.  Let this medicine dissolve under your tongue and wait around 10-20 minutes before eating or drinking after taking this medication.  Start taking Flomax at night which should help with stent related pain.  This medicine can cause drops in blood pressure so if you develop any lightheadedness with standing or weakness or low blood pressure persistently then stop taking this medication. 2. Treatment: rest, drink plenty of fluids, apply ice or heat whichever feels best areas of pain. 3. Follow Up: Please followup with your urologist within 3 days for discussion of your diagnoses and further evaluation after today's visit; Please return to the ER for persistent vomiting, high fevers or worsening symptoms

## 2019-11-19 NOTE — ED Provider Notes (Signed)
[facility] DEPT Provider Note   CSN: [id number] Arrival date & time: 11/19/19  1105     History No chief complaint on file.   Miranda Mathis is a 69 y.o. female with history of degenerative disc disease, diabetes mellitus, fibromyalgia, GERD, hypertension, hyperlipidemia, IBS, restless leg syndrome presents for evaluation of acute onset, progressively worsening abdominal pain and flank pain for 5 days. She reports that she underwent stent placement on the left with Dr. Junious Silk on 11/14/19 and since then she has experienced progressively worsening sharp pains worse along the left side of the abdomen. She notes associated nausea but no vomiting. Last night she developed low back pain worse on the right side. Symptoms worsen with certain movements. She denies diarrhea, constipation, melena, hematochezia, or urinary symptoms at this time including dysuria, hematuria, urgency or frequency. She has been taking Tylenol with a little bit of relief initially but it is not effective at this time. No fevers. No bowel or bladder incontinence or saddle anesthesia.  The history is provided by the patient.       Past Medical History:  Diagnosis Date  . Allergic rhinitis   . Barrett's esophagus   . Bladder spasms   . Chronic back pain   . DDD (degenerative disc disease), lumbar   . Degenerative scoliosis   . Depression   . Diabetes mellitus without complication ([place])   . Fibromyalgia   . Full dentures   . GAD (generalized anxiety disorder)   . GERD (gastroesophageal reflux disease)   . History of bladder stone   . History of colon polyps   . History of kidney stones   . History of migraine   . History of recurrent UTIs   . History of sepsis    10-18-2018  bacterium-ecoli blood culture  . Hydronephrosis, bilateral   . Hyperlipidemia   . Hypertension   . Hypothyroidism 11/02/2013 dx   followed by pcp  . IBS (irritable bowel syndrome)   . Insomnia   .  Osteopenia   . Osteopenia after menopause 06/06/2019   dexa 2019 T = -1.9, multiple site osteopenia; recheck 2021  . Peripheral neuropathy   . Psoriasis   . Restless leg syndrome   . Seborrheic dermatitis of scalp   . Spondylolysis of lumbar region   . Wears glasses     Patient Active Problem List   Diagnosis Date Noted  . Osteopenia after menopause 06/06/2019  . Bilateral hydronephrosis   . Degenerative scoliosis 09/07/2018  . Seborrheic dermatitis of scalp 12/02/2017  . Psoriasis 12/02/2017  . DM type 2 with diabetic peripheral neuropathy ([place]) 09/02/2016  . Recurrent kidney stones 08/29/2015  . History of Barrett's esophagus 08/29/2015  . GAD (generalized anxiety disorder) 08/29/2015  . GERD (gastroesophageal reflux disease) 08/29/2015  . Irritable bowel syndrome with constipation 08/29/2015  . Lumbosacral spondylosis with radiculopathy 08/29/2015  . Primary insomnia 10/25/2014  . Acquired hypothyroidism 11/02/2013  . Mixed hyperlipidemia 04/04/2012  . Allergic rhinitis 10/11/2007  . Fibromyalgia 10/11/2007  . Chronic low back pain 10/11/2007    Past Surgical History:  Procedure Laterality Date  . ABDOMINAL HYSTERECTOMY  1987  . BALLOON DILATION  07/10/2011   Procedure: BALLOON DILATION;  Surgeon: Fredricka Bonine, MD;  Location: [facility];  Service: Urology;  Laterality: Right;  . CESAREAN SECTION  X2  . CHOLECYSTECTOMY    . COLONOSCOPY    . CYSTO/ BILATERAL URETEROSCOPY / URETERAL BX'S/ BILATERAL URETERAL STENT PLACEMENT/ BLADDER STONE  EXTRACTION  03-13-2011  . CYSTOSCOPY W/ RETROGRADES  07/10/2011   Procedure: CYSTOSCOPY WITH RETROGRADE PYELOGRAM;  Surgeon: Fredricka Bonine, MD;  Location: [facility];  Service: Urology;  Laterality: Right;  . CYSTOSCOPY W/ URETERAL STENT PLACEMENT  07/10/2011   Procedure: CYSTOSCOPY WITH STENT REPLACEMENT;  Surgeon: Fredricka Bonine, MD;  Location: [facility];   Service: Urology;  Laterality: Right;  . CYSTOSCOPY W/ URETERAL STENT PLACEMENT Bilateral 10/22/2018   Procedure: CYSTOSCOPY WITH RETROGRADE PYELOGRAM/URETERAL STENT PLACEMENT-BILATERAL;  Surgeon: Ardis Hughs, MD;  Location: [facility];  Service: Urology;  Laterality: Bilateral;  . CYSTOSCOPY W/ URETERAL STENT REMOVAL  07/10/2011   Procedure: CYSTOSCOPY WITH STENT REMOVAL;  Surgeon: Fredricka Bonine, MD;  Location: [facility];  Service: Urology;  Laterality: Right;  . CYSTOSCOPY WITH URETEROSCOPY AND STENT PLACEMENT Bilateral 11/29/2018   Procedure: CYSTOSCOPY WITH BILATERAL URETEROSCOPY AND STENT EXCHANGE/ WITH LEFT BRUSH BIOPSY;  Surgeon: Festus Aloe, MD;  Location: [facility];  Service: Urology;  Laterality: Bilateral;  . CYSTOSCOPY/RETROGRADE/URETEROSCOPY Left 11/14/2019   Procedure: CYSTOSCOPY/RETROGRADE/URETEROSCOPY/ BIOPSY/ STENT PLACEMENT;  Surgeon: Festus Aloe, MD;  Location: [facility];  Service: Urology;  Laterality: Left;  . LUMBAR MICRODISCECTOMY  1990'S   L5 - S1  . RIGHT FOOT SURG  2003   HEEL  . RIGHT URETEROSCOPIC / URETERAL BX/ STENT PLACEMENT  04-17-2011  . UPPER GI ENDOSCOPY       OB History   No obstetric history on file.     Family History  Problem Relation Age of Onset  . COPD Mother   . Emphysema Mother   . Osteoarthritis Mother   . Rheum arthritis Mother   . Arthritis Mother   . Hypertension Mother   . Prostate cancer Father        w/mets  . Arthritis Father   . Birth defects Father   . Cancer Father   . Hypertension Father   . Arthritis Daughter   . Asthma Daughter   . Birth defects Daughter   . Depression Daughter   . Hearing loss Daughter   . Mental retardation Daughter   . Kidney Stones Daughter   . Arthritis Brother   . Hyperlipidemia Brother   . Coronary artery disease Paternal Grandfather   . Diabetes Paternal Aunt        x 3  . Arthritis Maternal Grandmother   . Colon cancer Neg Hx      Social History   Tobacco Use  . Smoking status: Never Smoker  . Smokeless tobacco: Never Used  Substance Use Topics  . Alcohol use: Yes    Alcohol/week: 1.0 standard drinks    Types: 1 Standard drinks or equivalent per week    Comment: rarely  . Drug use: No    Home Medications Prior to Admission medications   Medication Sig Start Date End Date Taking? Authorizing Provider  betamethasone dipropionate 0.05 % lotion Apply 1 application topically daily as needed. Patient taking differently: Apply 1 application topically every other day.  12/02/17  Yes Leamon Arnt, MD  Cholecalciferol (VITAMIN D) 50 MCG (2000 UT) tablet Take 2,000 Units by mouth daily.    Yes [provider]  clobetasol (TEMOVATE) 0.05 % external solution Apply 1 application topically 2 (two) times daily. Patient needs office visit. Patient taking differently: Apply 1 application topically every other day. Patient needs office visit. 09/18/19  Yes Sheffield, Kelli R, PA-C  escitalopram (LEXAPRO) 20 MG tablet Take 1 tablet (  20 mg total) by mouth daily. 03/09/19  Yes Leamon Arnt, MD  gabapentin (NEURONTIN) 300 MG capsule TAKE 1 CAPSULE BY MOUTH THREE TIMES A DAY Patient taking differently: Take 300 mg by mouth 3 (three) times daily.  04/03/19  Yes Leamon Arnt, MD  levothyroxine (SYNTHROID) 137 MCG tablet Take 1 tablet (137 mcg total) by mouth daily before breakfast. 01/09/19  Yes Leamon Arnt, MD  lisinopril (ZESTRIL) 5 MG tablet Take 1 tablet (5 mg total) by mouth daily. 08/09/19  Yes Leamon Arnt, MD  metFORMIN (GLUCOPHAGE) 500 MG tablet Take 1 tablet (500 mg total) by mouth 2 (two) times daily with a meal. 01/05/19  Yes Leamon Arnt, MD  omeprazole (PRILOSEC) 40 MG capsule TAKE 1 CAPSULE BY MOUTH EVERY DAY Patient taking differently: Take 40 mg by mouth daily.  10/23/19  Yes Leamon Arnt, MD  oxybutynin (DITROPAN) 5 MG tablet Take 5 mg by mouth 3 (three) times daily.   Yes [provider]  rosuvastatin (CRESTOR) 20 MG tablet Take 1 tablet (20 mg total) by mouth daily. 06/06/19  Yes Leamon Arnt, MD  senna-docusate (SENOKOT-S) 8.6-50 MG tablet Take 2 tablets by mouth every evening.    Yes [provider]  zolpidem (AMBIEN) 10 MG tablet TAKE 1 TABLET BY MOUTH AT BEDTIME AS NEEDED FOR SLEEP Patient taking differently: Take 10 mg by mouth at bedtime.  08/11/19  Yes Leamon Arnt, MD  amitriptyline (ELAVIL) 25 MG tablet TAKE 1 TABLET BY MOUTH EVERYDAY AT BEDTIME Patient not taking: Reported on 11/19/2019 10/08/18   Leamon Arnt, MD  ibuprofen (ADVIL) 400 MG tablet Take 1 tablet (400 mg total) by mouth every 6 (six) hours as needed. 11/19/19   Nils Flack, Shakema Surita A, PA-C  ondansetron (ZOFRAN ODT) 4 MG disintegrating tablet Take 1 tablet (4 mg total) by mouth every 8 (eight) hours as needed for nausea or vomiting. 11/19/19   Nils Flack, Jaxsun Ciampi A, PA-C  oxyCODONE-acetaminophen (PERCOCET/ROXICET) 5-325 MG tablet Take 1 tablet by mouth every 8 (eight) hours as needed for severe pain. 11/19/19   Alyn Jurney A, PA-C  tamsulosin (FLOMAX) 0.4 MG CAPS capsule Take 1 capsule (0.4 mg total) by mouth daily after supper. 11/19/19   Arliss Hepburn A, PA-C  pravastatin (PRAVACHOL) 40 MG tablet Take 1 tablet (40 mg total) by mouth daily. 01/26/19 06/06/19  Leamon Arnt, MD    Allergies    Patient has no known allergies.  Review of Systems   Review of Systems  Constitutional: Negative for chills and fever.  Respiratory: Negative for shortness of breath.   Cardiovascular: Negative for chest pain.  Gastrointestinal: Positive for abdominal pain and nausea. Negative for diarrhea and vomiting.  Genitourinary: Positive for flank pain. Negative for dysuria, hematuria and urgency.  Musculoskeletal: Positive for back pain.  Neurological: Negative for weakness and numbness.  All other systems reviewed and are negative.   Physical Exam Updated Vital Signs BP (!) 134/40   Pulse 64   Temp 98.9  F (37.2 C) (Oral)   Resp 19   Ht 5\' 2"  (1.575 m)   Wt 96.7 kg   SpO2 96%   BMI 38.98 kg/m   Physical Exam Vitals and nursing note reviewed.  Constitutional:      General: She is not in acute distress.    Appearance: She is well-developed. She is obese.  HENT:     Head: Normocephalic and atraumatic.  Eyes:     General:  Right eye: No discharge.        Left eye: No discharge.     Conjunctiva/sclera: Conjunctivae normal.  Neck:     Vascular: No JVD.     Trachea: No tracheal deviation.  Cardiovascular:     Rate and Rhythm: Normal rate and regular rhythm.     Pulses: Normal pulses.  Pulmonary:     Effort: Pulmonary effort is normal.     Comments: Crackles in the right lung base Abdominal:     General: Bowel sounds are normal. There is no distension.     Palpations: Abdomen is soft.     Tenderness: There is abdominal tenderness in the epigastric area, periumbilical area, suprapubic area and left lower quadrant. There is no right CVA tenderness, left CVA tenderness or guarding.  Musculoskeletal:        General: Tenderness present.     Comments: Diffuse midline lumbar spine tenderness with right paralumbar muscle tenderness. No deformity, crepitus, or step-off. Moves extremities spontaneously without difficulty.  Skin:    General: Skin is warm and dry.     Findings: No erythema.  Neurological:     Mental Status: She is alert.  Psychiatric:        Behavior: Behavior normal.     ED Results / Procedures / Treatments   Labs (all labs ordered are listed, but only abnormal results are displayed) Labs Reviewed  URINE CULTURE - Abnormal; Notable for the following components:      Result Value   Culture   (*)    Value: 70,000 COLONIES/mL ENTEROCOCCUS FAECALIS SUSCEPTIBILITIES TO FOLLOW Performed at [facility], [address], [place], [place] [address]    All other components within normal limits  COMPREHENSIVE METABOLIC PANEL - Abnormal; Notable for the  following components:   Glucose, Bld 151 (*)    Creatinine, Ser 1.07 (*)    GFR calc non Af Amer 53 (*)    All other components within normal limits  URINALYSIS, ROUTINE W REFLEX MICROSCOPIC - Abnormal; Notable for the following components:   Hgb urine dipstick SMALL (*)    Protein, ur 100 (*)    Leukocytes,Ua SMALL (*)    All other components within normal limits  CBC WITH DIFFERENTIAL/PLATELET  LIPASE, BLOOD    EKG None  Radiology CT ABDOMEN PELVIS W CONTRAST  Result Date: 11/19/2019 CLINICAL DATA:  Bib gems, patient had urinary stent placement last Tuesday, since then has had increased abdominal pain and bilateral flank pain. Denies bleeding, urinary retention/pain. Endorses nausea r/t pain, denies diarrhea/emesis. EXAM: CT ABDOMEN AND PELVIS WITH CONTRAST TECHNIQUE: Multidetector CT imaging of the abdomen and pelvis was performed using the standard protocol following bolus administration of intravenous contrast. CONTRAST:  143mL OMNIPAQUE IOHEXOL 300 MG/ML  SOLN COMPARISON:  CT abdomen pelvis 10/12/2019 FINDINGS: Lower chest: Mild dependent atelectasis, otherwise unremarkable. Hepatobiliary: No focal liver abnormality is seen. Status post cholecystectomy. No biliary dilatation. Pancreas: Unremarkable. No pancreatic ductal dilatation or surrounding inflammatory changes. Spleen: Normal in size without focal abnormality. Adrenals/Urinary Tract: Adrenal glands are unremarkable. Interval placement of a left ureteral stent with distal and within the urinary bladder and proximal and at the left UPJ. There is a small amount of expected stranding about the left ureter and left kidney secondary to recent stent placement. No perinephric hematoma or fluid collection. There is no hydronephrosis bilaterally. No renal masses. No renal calculi. There is a small amount of air in the anterior bladder likely related to recent instrumentation. The  bladder is otherwise unremarkable. Stomach/Bowel: Stomach is  within normal limits. Appendix is not clearly visualized. No evidence of bowel wall thickening, distention, or inflammatory changes. Scattered colonic diverticula without evidence of diverticulitis. Vascular/Lymphatic: Aortic atherosclerosis. No aneurysm. No enlarged abdominal or pelvic lymph nodes. Reproductive: Status post hysterectomy. No adnexal masses. Other: No abdominal wall hernia or abnormality. No abdominopelvic ascites. Musculoskeletal: No acute findings. Degenerative disc disease in the lower lumbar spine. IMPRESSION: 1. Interval placement of a left ureteral stent in good position. No acute complication. 2. Colonic diverticulosis without evidence of diverticulitis. 3. Aortic atherosclerosis. Aortic Atherosclerosis (ICD10-I70.0). Electronically Signed   By: Audie Pinto M.D.   On: 11/19/2019 13:40   DG Chest Portable 1 View  Result Date: 11/19/2019 CLINICAL DATA:  Lung crackles on auscultation. EXAM: PORTABLE CHEST 1 VIEW COMPARISON:  10/18/2018 and prior radiographs FINDINGS: The cardiomediastinal silhouette is unchanged. There is no evidence of focal airspace disease, pulmonary edema, suspicious pulmonary nodule/mass, pleural effusion, or pneumothorax. No acute bony abnormalities are identified. IMPRESSION: No active disease. Electronically Signed   By: Margarette Canada M.D.   On: 11/19/2019 12:46    Procedures Procedures (including critical care time)  Medications Ordered in ED Medications  HYDROmorphone (DILAUDID) injection 0.5 mg (0.5 mg Intravenous Given 11/19/19 1522)  ondansetron (ZOFRAN) injection 4 mg (4 mg Intravenous Given 11/19/19 1522)  iohexol (OMNIPAQUE) 300 MG/ML solution 100 mL (100 mLs Intravenous Contrast Given 11/19/19 1300)    ED Course  I have reviewed the triage vital signs and the nursing notes.  Pertinent labs & imaging results that were available during my care of the patient were reviewed by me and considered in my medical decision making (see chart for  details).    MDM Rules/Calculators/A&P                      Patient presenting for evaluation of flank pain and low back pains and nausea for 5 days status post left ureteral stent placement.  She is afebrile, vital signs are stable in the ED.  She is uncomfortable but nontoxic in appearance.  No red flag signs concerning for cauda equina or spinal abscess.  Abdomen shows mostly left-sided and midline tenderness.  No rebound or guarding noted.  Lab work reviewed and interpreted by myself shows no leukocytosis, no anemia, no metabolic derangements, no renal insufficiency.  Her UA shows proteinuria and hemoglobin but less concerning for UTI.  We will culture.  Imaging today shows no evidence of pneumonia, pleural effusions, pneumothorax, or acute surgical abdominal pathology.  Left ureteral stent confirms good placement with no evidence of obstruction on CT scan.  3:46 PM CONSULT: Spoke with Dr. Louis Meckel with urology who reviewed the patient's imaging and presentation.  He recommends starting the patient on Flomax as well as giving her Toradol in the ED and possibly B&O suppository if necessary.  He does not feel that the stent appears obstructed and at this point recommends pain control as primary recommendation.  She can follow-up with urology on an outpatient basis.  Patient's pain managed in the ED with Dilaudid.  Nausea managed with Zofran.  On reevaluation she reports her pain has gone down from 10/10 in severity to 3/10 in severity.  She feels comfortable with discharge home.  I relayed Dr. Carlton Adam recommendations.  Will start on Flomax and will discharge home with small amount of Percocet which was prescribed by Dr. Junious Silk previously.  Discussed appropriate use of medications and potential side effects.  Discussed strict ED return precautions.  Patient and family member at the bedside verbalized understanding of and agreement with plan and patient is stable for discharge at this time.   Patient was seen and evaluated by Dr. Johnney Killian who agrees with assessment and plan at this time.  Final Clinical Impression(s) / ED Diagnoses Final diagnoses:  Flank pain, acute    Rx / DC Orders ED Discharge Orders         Ordered    oxyCODONE-acetaminophen (PERCOCET/ROXICET) 5-325 MG tablet  Every 8 hours PRN     11/19/19 1647    tamsulosin (FLOMAX) 0.4 MG CAPS capsule  Daily after supper     11/19/19 1647    ondansetron (ZOFRAN ODT) 4 MG disintegrating tablet  Every 8 hours PRN     11/19/19 1647    ibuprofen (ADVIL) 400 MG tablet  Every 6 hours PRN     11/19/19 1647           Renita Papa, PA-C 11/20/19 1547    Charlesetta Shanks, MD 11/29/19 1347

## 2019-11-19 NOTE — ED Triage Notes (Signed)
Bib gems, patient had urinary stent placement last Tuesday, since then has had increased abdominal pain and bilateral flank pain. Denies bleeding, urinary retention/pain. Endorses nausea r/t pain, denies diarrhea/emesis. Pain initially 10/10, en route ems gave 100 mcg fentanyl, pain rated 5/10 in triage.

## 2019-11-21 LAB — URINE CULTURE: Culture: 70000 — AB

## 2019-11-22 ENCOUNTER — Telehealth: Payer: Self-pay | Admitting: *Deleted

## 2019-11-22 NOTE — Telephone Encounter (Signed)
Post ED Visit - Positive Culture Follow-up: Successful Patient Follow-Up  Culture assessed and recommendations reviewed by:  []  Elenor Quinones, Pharm.D. []  Heide Guile, Pharm.D., BCPS AQ-ID []  Parks Neptune, Pharm.D., BCPS []  Alycia Rossetti, Pharm.D., BCPS []  Kosse, Pharm.D., BCPS, AAHIVP []  Legrand Como, Pharm.D., BCPS, AAHIVP []  Salome Arnt, PharmD, BCPS []  Johnnette Gourd, PharmD, BCPS []  Hughes Better, PharmD, BCPS []  Leeroy Cha, PharmD  Positive urine culture  [x]  Patient discharged without antimicrobial prescription and treatment is now indicated []  Organism is resistant to prescribed ED discharge antimicrobial []  Patient with positive blood cultures  Changes discussed with ED provider Michaela Corner, PA New antibiotic prescription Amoxicillin 500mg  PO BID x 7 days Called to [facility] [phone]  Contacted patient, date 11/22/2019, time [place], [place] 11/22/2019, 10:57 AM

## 2019-12-06 ENCOUNTER — Ambulatory Visit: Payer: Medicare Other | Admitting: Family Medicine

## 2019-12-13 ENCOUNTER — Ambulatory Visit: Payer: Medicare Other | Admitting: Family Medicine

## 2019-12-20 ENCOUNTER — Other Ambulatory Visit: Payer: Self-pay

## 2019-12-20 ENCOUNTER — Ambulatory Visit (INDEPENDENT_AMBULATORY_CARE_PROVIDER_SITE_OTHER): Payer: Medicare Other | Admitting: Family Medicine

## 2019-12-20 ENCOUNTER — Encounter: Payer: Self-pay | Admitting: Family Medicine

## 2019-12-20 VITALS — BP 132/84 | HR 65 | Temp 97.7°F | Ht 62.0 in | Wt 212.0 lb

## 2019-12-20 DIAGNOSIS — N200 Calculus of kidney: Secondary | ICD-10-CM

## 2019-12-20 DIAGNOSIS — K581 Irritable bowel syndrome with constipation: Secondary | ICD-10-CM

## 2019-12-20 DIAGNOSIS — E1142 Type 2 diabetes mellitus with diabetic polyneuropathy: Secondary | ICD-10-CM

## 2019-12-20 DIAGNOSIS — F321 Major depressive disorder, single episode, moderate: Secondary | ICD-10-CM

## 2019-12-20 DIAGNOSIS — E782 Mixed hyperlipidemia: Secondary | ICD-10-CM

## 2019-12-20 DIAGNOSIS — M797 Fibromyalgia: Secondary | ICD-10-CM

## 2019-12-20 DIAGNOSIS — F411 Generalized anxiety disorder: Secondary | ICD-10-CM

## 2019-12-20 LAB — COMPREHENSIVE METABOLIC PANEL
ALT: 35 U/L (ref 0–35)
AST: 37 U/L (ref 0–37)
Albumin: 4.2 g/dL (ref 3.5–5.2)
Alkaline Phosphatase: 57 U/L (ref 39–117)
BUN: 22 mg/dL (ref 6–23)
CO2: 26 mEq/L (ref 19–32)
Calcium: 9.5 mg/dL (ref 8.4–10.5)
Chloride: 101 mEq/L (ref 96–112)
Creatinine, Ser: 1.07 mg/dL (ref 0.40–1.20)
GFR: 50.76 mL/min — ABNORMAL LOW (ref >60.00)
Glucose, Bld: 126 mg/dL — ABNORMAL HIGH (ref 70–99)
Potassium: 4.5 mEq/L (ref 3.5–5.1)
Sodium: 136 mEq/L (ref 135–145)
Total Bilirubin: 0.5 mg/dL (ref 0.2–1.2)
Total Protein: 6.7 g/dL (ref 6.0–8.3)

## 2019-12-20 LAB — LIPID PANEL
Cholesterol: 135 mg/dL (ref 0–200)
HDL: 33.1 mg/dL — ABNORMAL LOW (ref >39.00)
LDL Cholesterol: 80 mg/dL (ref 0–99)
NonHDL: 101.54
Total CHOL/HDL Ratio: 4
Triglycerides: 108 mg/dL (ref 0.0–149.0)
VLDL: 21.6 mg/dL (ref 0.0–40.0)

## 2019-12-20 LAB — POCT GLYCOSYLATED HEMOGLOBIN (HGB A1C): Hemoglobin A1C: 6.9 % — AB (ref 4.0–5.6)

## 2019-12-20 MED ORDER — DICYCLOMINE HCL 10 MG PO CAPS: 10.0000 mg | ORAL_CAPSULE | Freq: Three times a day (TID) | ORAL | 3 refills | Status: DC

## 2019-12-20 MED ORDER — MELOXICAM 7.5 MG PO TABS
7.5000 mg | ORAL_TABLET | Freq: Every day | ORAL | 3 refills | Status: DC | PRN
Start: 2019-12-20 — End: 2020-04-24

## 2019-12-20 MED ORDER — BUPROPION HCL ER (SR) 150 MG PO TB12: 150.0000 mg | ORAL_TABLET | Freq: Two times a day (BID) | ORAL | 5 refills | Status: DC

## 2019-12-20 NOTE — Patient Instructions (Signed)
Please return in 3 months to recheck mood and abdominal symptoms  Try adding the wellbutrin to the lexapro to see if that helps.   Your diabetes looks good.  Let's see how the cholesterol is looking.   If you have any questions or concerns, please don't hesitate to send me a message via MyChart or call the office at [phone]. Thank you for visiting with [name] today! It's our pleasure caring for you.

## 2019-12-20 NOTE — Progress Notes (Signed)
Subjective  CC:  Chief Complaint  Patient presents with  . Diabetes    Does not check at home.  . Irritable Bowel Syndrome  . Back Pain  . Hyperlipidemia  . Depression    HPI: Miranda Mathis is a 69 y.o. female who presents to the office today for follow up of diabetes and problems listed above in the chief complaint.   Diabetes follow up: Her diabetic control is reported as Unchanged. Taking meds w/o AEs.  She denies exertional CP or SOB or symptomatic hypoglycemia. She denies foot sores or paresthesias - on nightly gabapentin.   Recurrent kidney stones now on abx and managed by urology actively  Low back pain, chronic: has questions on best way to treat. No new sxs   HLD on crestor 20 (recent increase) and tolerating. Due for recheck.   Depression is worse: struggles with low mood lately; due to daughter and her high care needs. Sad but not suicidal. On lexapro for some time. No longer working as well.   Wt Readings from Last 3 Encounters:  12/20/19 212 lb (96.2 kg)  11/19/19 213 lb 2 oz (96.7 kg)  11/02/19 213 lb 2 oz (96.7 kg)    BP Readings from Last 3 Encounters:  12/20/19 132/84  11/19/19 (!) 134/40  11/14/19 115/88    Assessment  1. DM type 2 with diabetic peripheral neuropathy ([place])   2. Mixed hyperlipidemia   3. Recurrent kidney stones   4. Irritable bowel syndrome with constipation   5. GAD (generalized anxiety disorder)   6. Fibromyalgia   7. Depression, major, single episode, moderate ([place])      Plan   Diabetes is currently well controlled. Continue same meds.   HLD for recheck today on increased dose of crestor.   IBS is active: add back prn bentyl. Has worked well for her in past from GI; reports GERD is controlled  GAD and now with depressive episode: continue lexapro and add wellbutrin.   Back pain: tylenol es 2 bid; mobic if needed.  Follow up: 3 months to recheck mood and abdominal sxs. Orders Placed This Encounter  Procedures  .  Comprehensive metabolic panel  . Lipid panel  . POCT glycosylated hemoglobin (Hb A1C)   Meds ordered this encounter  Medications  . dicyclomine (BENTYL) 10 MG capsule    Sig: Take 1 capsule (10 mg total) by mouth 4 (four) times daily -  before meals and at bedtime. As needed    Dispense:  60 capsule    Refill:  3  . buPROPion (WELLBUTRIN SR) 150 MG 12 hr tablet    Sig: Take 1 tablet (150 mg total) by mouth 2 (two) times daily.    Dispense:  60 tablet    Refill:  5  . meloxicam (MOBIC) 7.5 MG tablet    Sig: Take 1 tablet (7.5 mg total) by mouth daily as needed for pain.    Dispense:  30 tablet    Refill:  3      Immunization History  Administered Date(s) Administered  . Fluad Quad(high Dose 65+) 04/13/2019  . Influenza Whole 03/22/2001, 03/22/2006, 03/22/2010  . Influenza, High Dose Seasonal PF 02/19/2016, 06/02/2018  . Influenza, Seasonal, Injecte, Preservative Fre 06/27/2012  . Influenza-Unspecified 04/13/2019  . Pneumococcal Conjugate-13 11/27/2015  . Pneumococcal Polysaccharide-23 11/13/2010  . Tdap 11/13/2010  . Zoster 11/27/2015  . Zoster Recombinat (Shingrix) 04/13/2019    Diabetes Related Lab Review: Lab Results  Component Value Date  HGBA1C 6.9 (A) 12/20/2019   HGBA1C 7.1 (H) 11/02/2019   HGBA1C 6.4 (A) 06/06/2019    Lab Results  Component Value Date   MICROALBUR 2.9 (H) 06/06/2019   Lab Results  Component Value Date   CREATININE 1.07 (H) 11/19/2019   BUN 19 11/19/2019   NA 141 11/19/2019   K 4.6 11/19/2019   CL 106 11/19/2019   CO2 26 11/19/2019   Lab Results  Component Value Date   CHOL 183 06/06/2019   CHOL 152 06/02/2018   CHOL 166 08/27/2017   Lab Results  Component Value Date   HDL 36.50 (L) 06/06/2019   HDL 36.20 (L) 06/02/2018   HDL 35 08/27/2017   Lab Results  Component Value Date   LDLCALC 123 (H) 06/06/2019   LDLCALC 97 06/02/2018   LDLCALC 112 08/27/2017   LDLCALC 112 08/27/2017   Lab Results  Component Value Date   TRIG  116.0 06/06/2019   TRIG 95.0 06/02/2018   TRIG 96 08/27/2017   Lab Results  Component Value Date   CHOLHDL 5 06/06/2019   CHOLHDL 4 06/02/2018   CHOLHDL 6 01/09/2015   Lab Results  Component Value Date   LDLDIRECT 165.3 11/13/2010   LDLDIRECT 143.5 08/30/2008   The 10-year ASCVD risk score Mikey Bussing DC Jr., et al., 2013) is: 23.3%   Values used to calculate the score:     Age: 19 years     Sex: Female     Is Non-Hispanic African American: No     Diabetic: Yes     Tobacco smoker: No     Systolic Blood Pressure: 923 mmHg     Is BP treated: Yes     HDL Cholesterol: 36.5 mg/dL     Total Cholesterol: 183 mg/dL I have reviewed the PMH, Fam and Soc history. Patient Active Problem List   Diagnosis Date Noted  . DM type 2 with diabetic peripheral neuropathy ([place]) 09/02/2016    Priority: High    Diagnosed 2019; started meds 2020; + microalbunuria on ace   . Recurrent kidney stones 08/29/2015    Priority: High    Overview:  S/p stents, multiple, [facility], Dr. Linton Ham   . GAD (generalized anxiety disorder) 08/29/2015    Priority: High  . Acquired hypothyroidism 11/02/2013    Priority: High  . Mixed hyperlipidemia 04/04/2012    Priority: High  . Fibromyalgia 10/11/2007    Priority: High  . Chronic low back pain 10/11/2007    Priority: High  . Osteopenia after menopause 06/06/2019    Priority: Medium    dexa 2019 T = -1.9, multiple site osteopenia; recheck 2021   . Bilateral hydronephrosis     Priority: Medium  . Degenerative scoliosis 09/07/2018    Priority: Medium  . Psoriasis 12/02/2017    Priority: Medium  . History of Barrett's esophagus 08/29/2015    Priority: Medium    Last EGD 2018, no evidence of Barrett's esophagus, no further EGD recommended   . GERD (gastroesophageal reflux disease) 08/29/2015    Priority: Medium  . Irritable bowel syndrome with constipation 08/29/2015    Priority: Medium  . Lumbosacral spondylosis with radiculopathy 08/29/2015     Priority: Medium  . Primary insomnia 10/25/2014    Priority: Medium    Overview:  Last Assessment & Plan:  Insomnia is stable with current regimen of Ambien. Continue current dosage of Ambien.   . Seborrheic dermatitis of scalp 12/02/2017    Priority: Low  . Allergic rhinitis 10/11/2007  Priority: Low    Qualifier: Diagnosis of  By: Tiney Rouge CMA, Ellison Hughs       Social History: Patient  reports that she has never smoked. She has never used smokeless tobacco. She reports current alcohol use of about 1.0 standard drink of alcohol per week. She reports that she does not use drugs.  Review of Systems: Ophthalmic: negative for eye pain, loss of vision or double vision Cardiovascular: negative for chest pain Respiratory: negative for SOB or persistent cough Gastrointestinal: negative for abdominal pain Genitourinary: negative for dysuria or gross hematuria MSK: negative for foot lesions Neurologic: negative for weakness or gait disturbance  Objective  Vitals: BP 132/84 (BP Location: Left Arm, Patient Position: Sitting, Cuff Size: Large)   Pulse 65   Temp 97.7 F (36.5 C) (Temporal)   Ht 5\' 2"  (1.575 m)   Wt 212 lb (96.2 kg)   SpO2 94%   BMI 38.78 kg/m  General: well appearing, no acute distress  Psych:  Alert and oriented, depressed mood and affect Cardiovascular:  Nl S1 and S2, RRR without murmur, gallop or rub. no edema Respiratory:  Good breath sounds bilaterally, CTAB with normal effort, no rales Gastrointestinal: normal BS, soft, nontender Skin:  Warm, no rashes Neurologic:   Mental status is normal. normal gait  Diabetic education: ongoing education regarding chronic disease management for diabetes was given today. We continue to reinforce the ABC's of diabetic management: A1c (<7 or 8 dependent upon patient), tight blood pressure control, and cholesterol management with goal LDL < 100 minimally. We discuss diet strategies, exercise recommendations, medication options and  possible side effects. At each visit, we review recommended immunizations and preventive care recommendations for diabetics and stress that good diabetic control can prevent other problems. See below for this patient's data.    Commons side effects, risks, benefits, and alternatives for medications and treatment plan prescribed today were discussed, and the patient expressed understanding of the given instructions. Patient is instructed to call or message via MyChart if he/she has any questions or concerns regarding our treatment plan. No barriers to understanding were identified. We discussed Red Flag symptoms and signs in detail. Patient expressed understanding regarding what to do in case of urgent or emergency type symptoms.   Medication list was reconciled, printed and provided to the patient in AVS. Patient instructions and summary information was reviewed with the patient as documented in the AVS. This note was prepared with assistance of Dragon voice recognition software. Occasional wrong-word or sound-a-like substitutions may have occurred due to the inherent limitations of voice recognition software  This visit occurred during the SARS-CoV-2 public health emergency.  Safety protocols were in place, including screening questions prior to the visit, additional usage of staff PPE, and extensive cleaning of exam room while observing appropriate contact time as indicated for disinfecting solutions.

## 2019-12-21 DIAGNOSIS — R3915 Urgency of urination: Secondary | ICD-10-CM | POA: Diagnosis not present

## 2019-12-30 ENCOUNTER — Other Ambulatory Visit: Payer: Self-pay | Admitting: Family Medicine

## 2020-01-12 ENCOUNTER — Other Ambulatory Visit: Payer: Self-pay | Admitting: Family Medicine

## 2020-01-29 ENCOUNTER — Ambulatory Visit: Payer: Medicare Other

## 2020-02-10 ENCOUNTER — Other Ambulatory Visit: Payer: Self-pay | Admitting: Family Medicine

## 2020-02-13 DIAGNOSIS — N131 Hydronephrosis with ureteral stricture, not elsewhere classified: Secondary | ICD-10-CM | POA: Diagnosis not present

## 2020-02-20 ENCOUNTER — Other Ambulatory Visit: Payer: Self-pay | Admitting: Family Medicine

## 2020-02-26 ENCOUNTER — Other Ambulatory Visit: Payer: Self-pay | Admitting: Family Medicine

## 2020-03-04 DIAGNOSIS — R1032 Left lower quadrant pain: Secondary | ICD-10-CM | POA: Diagnosis not present

## 2020-03-04 DIAGNOSIS — N131 Hydronephrosis with ureteral stricture, not elsewhere classified: Secondary | ICD-10-CM | POA: Diagnosis not present

## 2020-03-04 DIAGNOSIS — R3915 Urgency of urination: Secondary | ICD-10-CM | POA: Diagnosis not present

## 2020-03-06 ENCOUNTER — Other Ambulatory Visit: Payer: Self-pay | Admitting: Family Medicine

## 2020-03-15 ENCOUNTER — Other Ambulatory Visit: Payer: Self-pay | Admitting: Family Medicine

## 2020-03-19 DIAGNOSIS — R3982 Chronic bladder pain: Secondary | ICD-10-CM | POA: Diagnosis not present

## 2020-03-19 DIAGNOSIS — R1032 Left lower quadrant pain: Secondary | ICD-10-CM | POA: Diagnosis not present

## 2020-03-19 DIAGNOSIS — M62838 Other muscle spasm: Secondary | ICD-10-CM | POA: Diagnosis not present

## 2020-03-19 DIAGNOSIS — M6289 Other specified disorders of muscle: Secondary | ICD-10-CM | POA: Diagnosis not present

## 2020-03-22 ENCOUNTER — Ambulatory Visit: Payer: Medicare Other | Admitting: Family Medicine

## 2020-03-25 ENCOUNTER — Ambulatory Visit (INDEPENDENT_AMBULATORY_CARE_PROVIDER_SITE_OTHER): Payer: Medicare Other | Admitting: Nurse Practitioner

## 2020-03-25 ENCOUNTER — Other Ambulatory Visit (INDEPENDENT_AMBULATORY_CARE_PROVIDER_SITE_OTHER): Payer: Medicare Other

## 2020-03-25 ENCOUNTER — Encounter: Payer: Self-pay | Admitting: Nurse Practitioner

## 2020-03-25 VITALS — BP 120/84 | HR 67 | Ht 62.0 in | Wt 216.3 lb

## 2020-03-25 DIAGNOSIS — R1032 Left lower quadrant pain: Secondary | ICD-10-CM

## 2020-03-25 LAB — COMPREHENSIVE METABOLIC PANEL
ALT: 30 U/L (ref 0–35)
AST: 34 U/L (ref 0–37)
Albumin: 4.3 g/dL (ref 3.5–5.2)
Alkaline Phosphatase: 60 U/L (ref 39–117)
BUN: 18 mg/dL (ref 6–23)
CO2: 28 mEq/L (ref 19–32)
Calcium: 9.4 mg/dL (ref 8.4–10.5)
Chloride: 102 mEq/L (ref 96–112)
Creatinine, Ser: 1.14 mg/dL (ref 0.40–1.20)
GFR: 47.14 mL/min — ABNORMAL LOW (ref >60.00)
Glucose, Bld: 106 mg/dL — ABNORMAL HIGH (ref 70–99)
Potassium: 4.3 mEq/L (ref 3.5–5.1)
Sodium: 139 mEq/L (ref 135–145)
Total Bilirubin: 0.5 mg/dL (ref 0.2–1.2)
Total Protein: 7.4 g/dL (ref 6.0–8.3)

## 2020-03-25 LAB — CBC WITH DIFFERENTIAL/PLATELET
Basophils Absolute: 0.1 10*3/uL (ref 0.0–0.1)
Basophils Relative: 0.8 % (ref 0.0–3.0)
Eosinophils Absolute: 0.3 10*3/uL (ref 0.0–0.7)
Eosinophils Relative: 3.4 % (ref 0.0–5.0)
HCT: 38.8 % (ref 36.0–46.0)
Hemoglobin: 12.8 g/dL (ref 12.0–15.0)
Lymphocytes Relative: 20.3 % (ref 12.0–46.0)
Lymphs Abs: 1.7 10*3/uL (ref 0.7–4.0)
MCHC: 33 g/dL (ref 30.0–36.0)
MCV: 87.3 fl (ref 78.0–100.0)
Monocytes Absolute: 0.6 10*3/uL (ref 0.1–1.0)
Monocytes Relative: 7.2 % (ref 3.0–12.0)
Neutro Abs: 5.6 10*3/uL (ref 1.4–7.7)
Neutrophils Relative %: 68.3 % (ref 43.0–77.0)
Platelets: 207 10*3/uL (ref 150.0–400.0)
RBC: 4.45 Mil/uL (ref 3.87–5.11)
RDW: 14.8 % (ref 11.5–15.5)
WBC: 8.3 10*3/uL (ref 4.0–10.5)

## 2020-03-25 LAB — C-REACTIVE PROTEIN: CRP: 1 mg/dL (ref 0.5–20.0)

## 2020-03-25 NOTE — Progress Notes (Addendum)
03/25/2020 Miranda Mathis [id number] [birth year]   Chief Complaint:  LLQ abdominal pain  History of Present Illness: Miranda Mathis is a 69 year old female with a past medical history of depression,  fibromyalgia, degenerative disc disease, chronic back pain, hypertension, hypothyroidism, DM II, peripheral neuropathy, migraine headaches, kidney stones, recurrent UTIs, GERD, IBS and colon polyps.  Past cholecystectomy and C section. She presents today for further evaluation regarding left lower quadrant abdominal pain which started 2 to 3 weeks ago.  She describes her left lower quadrant pain as sharp and she often grabs her left abdomen and holds it for comfort.  Left lower quadrant abdominal pain has progressively worsened over the past week and is interfering with her sleep.  She denies having any fever, sweats or chills.  She is taking a stool softener daily which results in passing a normal formed brown bowel movement daily.  She denies having any constipation prior to the onset of her left lower abdominal pain.  She has a history of kidney stones followed by urology.  She underwent a left ureteral stent placement 10/2019.  An abdominal/pelvic CT 11/19/2019 showed interval placement of a left ureteral stent which was in good position, diverticulosis without evidence of diverticulitis and aortic atherosclerosis.  She reported having a kidney scan or CT done by her [facility] urologist a few weeks ago which she reported was normal.  She was treated for a UTI with Clindamycin.  Her most recent colonoscopy was done by a GI in [place] with [facility] 05/05/2017 which identified multiple tubular adenomatous polyps which were removed from the cecum, asending colon and transverse colon.  In note I am unable to review the colonoscopy procedure document in care everywhere but the biopsy results were obtained, see biopsy results below.  The patient stated she was advised to repeat a colonoscopy in 2 years which  was not done.  A colonoscopy done by Dr. Carlean Purl 01/11/2003 showed sigmoid diverticulosis and external hemorrhoids. History of GERD, past Barrett's esophagus. She remains on Omeprazole 40mg  daily. GERD symptoms are well controlled. She reported undergoing an EGD at the time of her colonoscopy in 2018.   Abd/pelvic CT 11/19/2019: 1. Interval placement of a left ureteral stent in good position. No acute complication. 2. Colonic diverticulosis without evidence of diverticulitis. 3. Aortic atherosclerosis.  Colonoscopy 05/05/2017: 1. COLON, CECUM POLYP, POLYPECTOMY:   - FRAGMENTS OF TUBULAR ADENOMA.   2. COLON, ASCENDING POLYP, POLYPECTOMY:   - TUBULAR ADENOMA.   3. COLON, TRANSVERSE POLYP, POLYPECTOMY:   - FRAGMENTS OF TUBULAR ADENOMA.     Past Medical History:  Diagnosis Date  . Allergic rhinitis   . Barrett's esophagus   . Bladder spasms   . Chronic back pain   . DDD (degenerative disc disease), lumbar   . Degenerative scoliosis   . Depression   . Diabetes mellitus without complication ([place])   . Fibromyalgia   . Full dentures   . GAD (generalized anxiety disorder)   . GERD (gastroesophageal reflux disease)   . History of bladder stone   . History of colon polyps   . History of kidney stones   . History of migraine   . History of recurrent UTIs   . History of sepsis    10-18-2018  bacterium-ecoli blood culture  . Hydronephrosis, bilateral   . Hyperlipidemia   . Hypertension   . Hypothyroidism 11/02/2013 dx   followed by pcp  . IBS (irritable bowel syndrome)   . Insomnia   .  Osteopenia   . Osteopenia after menopause 06/06/2019   dexa 2019 T = -1.9, multiple site osteopenia; recheck 2021  . Peripheral neuropathy   . Psoriasis   . Restless leg syndrome   . Seborrheic dermatitis of scalp   . Spondylolysis of lumbar region   . Wears glasses     Past Surgical History:  Procedure Laterality Date  . ABDOMINAL HYSTERECTOMY  1987  . BALLOON DILATION  07/10/2011    Procedure: BALLOON DILATION;  Surgeon: Fredricka Bonine, MD;  Location[facility];  Service: Urology;  Laterality: Right;  . CESAREAN SECTION  X2  . CHOLECYSTECTOMY    . COLONOSCOPY    . CYSTO/ BILATERAL URETEROSCOPY / URETERAL BX'S/ BILATERAL URETERAL STENT PLACEMENT/ BLADDER STONE EXTRACTION  03-13-2011  . CYSTOSCOPY W/ RETROGRADES  07/10/2011   Procedure: CYSTOSCOPY WITH RETROGRADE PYELOGRAM;  Surgeon: Fredricka Bonine, MD;  Location: [facility];  Service: Urology;  Laterality: Right;  . CYSTOSCOPY W/ URETERAL STENT PLACEMENT  07/10/2011   Procedure: CYSTOSCOPY WITH STENT REPLACEMENT;  Surgeon: Fredricka Bonine, MD;  Location: [facility];  Service: Urology;  Laterality: Right;  . CYSTOSCOPY W/ URETERAL STENT PLACEMENT Bilateral 10/22/2018   Procedure: CYSTOSCOPY WITH RETROGRADE PYELOGRAM/URETERAL STENT PLACEMENT-BILATERAL;  Surgeon: Ardis Hughs, MD;  Location: [place];  Service: Urology;  Laterality: Bilateral;  . CYSTOSCOPY W/ URETERAL STENT REMOVAL  07/10/2011   Procedure: CYSTOSCOPY WITH STENT REMOVAL;  Surgeon: Fredricka Bonine, MD;  Location: [facility];  Service: Urology;  Laterality: Right;  . CYSTOSCOPY WITH URETEROSCOPY AND STENT PLACEMENT Bilateral 11/29/2018   Procedure: CYSTOSCOPY WITH BILATERAL URETEROSCOPY AND STENT EXCHANGE/ WITH LEFT BRUSH BIOPSY;  Surgeon: Festus Aloe, MD;  Location: [facility]  Service: Urology;  Laterality: Bilateral;  . CYSTOSCOPY/RETROGRADE/URETEROSCOPY Left 11/14/2019   Procedure: CYSTOSCOPY/RETROGRADE/URETEROSCOPY/ BIOPSY/ STENT PLACEMENT;  Surgeon: Festus Aloe, MD;  Location: [facility];  Service: Urology;  Laterality: Left;  . LUMBAR MICRODISCECTOMY  1990'S   L5 - S1  . RIGHT FOOT SURG  2003   HEEL  . RIGHT URETEROSCOPIC / URETERAL BX/ STENT PLACEMENT  04-17-2011  . UPPER GI ENDOSCOPY      Social History: Married. Retired. She has  one son and one daughter. Nonsmoker. She drinks one glass of wine once weekly. No drug use.   Family History: Mother died age 39 or 54 from COPD and pneumonia. Father died around age 33 or 47 secondary to prostate cancer with metastasis.  Brother died MVA. Brother died from agent orange complications. One living brother age 91's. No family, history of esophageal, gastric or colon cancer.   Current Outpatient Medications on File Prior to Visit  Medication Sig Dispense Refill  . buPROPion (WELLBUTRIN SR) 150 MG 12 hr tablet Take 1 tablet (150 mg total) by mouth 2 (two) times daily. 60 tablet 5  . Cholecalciferol (VITAMIN D) 50 MCG (2000 UT) tablet Take 2,000 Units by mouth daily.     . clobetasol (TEMOVATE) 0.05 % external solution Apply 1 application topically 2 (two) times daily. Patient needs office visit. (Patient taking differently: Apply 1 application topically every other day. Patient needs office visit.) 50 mL 1  . dicyclomine (BENTYL) 10 MG capsule TAKE 1 CAPSULE BY MOUTH 4 TIMES A DAY BEFORE MEALS AND AT BEDTIME AS NEEDED 60 capsule 0  . escitalopram (LEXAPRO) 20 MG tablet TAKE 1 TABLET BY MOUTH EVERY DAY 90 tablet 3  . gabapentin (NEURONTIN) 300 MG capsule TAKE 1  CAPSULE BY MOUTH THREE TIMES A DAY (Patient taking differently: Take 300 mg by mouth 3 (three) times daily. ) 270 capsule 2  . levothyroxine (SYNTHROID) 137 MCG tablet Take 1 tablet (137 mcg total) by mouth daily before breakfast. 90 tablet 3  . lisinopril (ZESTRIL) 5 MG tablet Take 1 tablet (5 mg total) by mouth daily. 90 tablet 3  . meloxicam (MOBIC) 7.5 MG tablet Take 1 tablet (7.5 mg total) by mouth daily as needed for pain. 30 tablet 3  . metFORMIN (GLUCOPHAGE) 500 MG tablet Take 1 tablet (500 mg total) by mouth 2 (two) times daily with a meal. 180 tablet 3  . omeprazole (PRILOSEC) 40 MG capsule TAKE 1 CAPSULE BY MOUTH EVERY DAY (Patient taking differently: Take 40 mg by mouth daily. ) 90 capsule 1  . ondansetron (ZOFRAN ODT)  4 MG disintegrating tablet Take 1 tablet (4 mg total) by mouth every 8 (eight) hours as needed for nausea or vomiting. 10 tablet 0  . rosuvastatin (CRESTOR) 20 MG tablet Take 1 tablet (20 mg total) by mouth daily. 90 tablet 3  . senna-docusate (SENOKOT-S) 8.6-50 MG tablet Take 2 tablets by mouth every evening.     . zolpidem (AMBIEN) 10 MG tablet Take 1 tablet (10 mg total) by mouth at bedtime as needed for sleep. TAKE 1 TABLET BY MOUTH EVERY DAY AT BEDTIME AS NEEDED FOR SLEEP 30 tablet 5  . ibuprofen (ADVIL) 400 MG tablet Take 1 tablet (400 mg total) by mouth every 6 (six) hours as needed. 30 tablet 0  . OXYBUTYNIN CHLORIDE ER PO Take 10 mg by mouth daily. Take 1 tablet by mouth daily    . [DISCONTINUED] pravastatin (PRAVACHOL) 40 MG tablet Take 1 tablet (40 mg total) by mouth daily. 90 tablet 1   No current facility-administered medications on file prior to visit.    No Known Allergies  Current Medications, Allergies, Past Medical History, Past Surgical History, Family History and Social History were reviewed in [employer] record.   Review of Systems:   Constitutional: Negative for fever, sweats, chills or weight loss.  Respiratory: Negative for shortness of breath.   Cardiovascular: Negative for chest pain, palpitations and leg swelling.  Gastrointestinal: See HPI.  Musculoskeletal: Negative for back pain or muscle aches.  Neurological: Negative for dizziness, headaches or paresthesias.    Physical Exam: BP 120/84   Pulse 67   Ht 5\' 2"  (1.575 m)   Wt 216 lb 5 oz (98.1 kg)   BMI 39.56 kg/m  General: Well developed 69 year old female in no acute distress. Head: Normocephalic and atraumatic. Eyes: No scleral icterus. Conjunctiva pink . Ears: Normal auditory acuity. Mouth: Dentition intact. No ulcers or lesions.  Lungs: Clear throughout to auscultation. Heart: Regular rate and rhythm, no murmur. Abdomen: Soft, nondistended. Moderate LLQ tenderness without  rebound or guarding. + left CVA tenderness. No masses or hepatomegaly. Normal bowel sounds x 4 quadrants.  Rectal: Deferred.  Musculoskeletal: Symmetrical with no gross deformities. Extremities: No edema. Neurological: Alert oriented x 4. No focal deficits.  Psychological: Alert and cooperative. Normal mood and affect  Assessment and Recommendations:  52. 69 year old female with LLQ pain -CBC, CMP and CRP -CTAP with oral and IV contrast. BUN/Cr level to be reviewed prior to the patient receiving IV contrast -Request copy of most recent abdominal image study (? CT) from [facility] -Bentyl 10 mg one po QID PRN -Push fluids -To the ED if patient develops severe abdominal  pain -Request copy of colonoscopy 2018 procedure report   2. History of colon polyps  -Schedule a colonoscopy after the above evaluation completed. If she has diverticulitis, a colonoscopy will be scheduled 6 to 8 weeks later  3. GERD, prior history of Barrett's esophagus  -Continue Omeprazole 40mg  QD -Request EGD procedure and  Biopsy report 2018 from [place]   4. History of kidney stones, s/p ureteral stent 10/2019 and recurrent UTI -Patient to follow up with her urologist regarding + left CVA tenderness on exam today  ADDENDUM: Colonoscopy procedure report 05/05/2017 received.  Multiple nonbleeding diverticula were seen in the sigmoid colon. Diverticulosis appeared to be of mild severity. A single sessile 12 mm polyp was found in the cecum and removed. A single sessile 4 mm polyp was found in the cecum and removed. A single sessile 8 mm polyp was found in the ascending colon and was removed. A single sessile 6 mm polyp was found in the transverse colon and removed. A single sessile 8 mm polyp was found in the transverse colon and removed. Medium nonbleeding grade 3 internal hemorrhoids were noted. Recall colonoscopy 3 years   EGD 05/05/2017 Normal esophagus Normal duodenum Erythema in the  prepyloric region compatible with NSAID induced gastropathy

## 2020-03-25 NOTE — Patient Instructions (Signed)
If you are age 70 or older, your body mass index should be between 23-30. Your Body mass index is 39.56 kg/m. If this is out of the aforementioned range listed, please consider follow up with your Primary Care Provider.  If you are age 21 or younger, your body mass index should be between 19-25. Your Body mass index is 39.56 kg/m. If this is out of the aformentioned range listed, please consider follow up with your Primary Care Provider.  ______________________________________________________________ Your provider has requested that you go to the basement level for lab work before leaving today. Press "B" on the elevator. The lab is located at the first door on the left as you exit the elevator. ______________________________________________________________  Miranda Mathis have been scheduled for a CT scan of the abdomen and pelvis at [facility], 1st floor Radiology. You are scheduled on 04/05/2020  at 3:00pm. You should arrive 15 minutes prior to your appointment time for registration 2:45 PM.  Please pick up 2 bottles of contrast from [facility] at least 3 days prior to your scan. The solution may taste better if refrigerated, but do NOT add ice or any other liquid to this solution. Shake well before drinking.   Please follow the written instructions below on the day of your exam:   1) Do not eat anything after 11:00am (4 hours prior to your test)   2) Drink 1 bottle of contrast @ 1:00pm (2 hours prior to your exam)  Remember to shake well before drinking and do NOT pour over ice.     Drink 1 bottle of contrast @ 2:00pm(1 hour prior to your exam)   You may take any medications as prescribed with a small amount of water, if necessary. If you take any of the following medications: METFORMIN, GLUCOPHAGE, GLUCOVANCE, AVANDAMET, RIOMET, FORTAMET, [place] MET, JANUMET, GLUMETZA or METAGLIP, you MAY be asked to Pfluger this medication 48 hours AFTER the exam.   The purpose of you drinking the oral  contrast is to aid in the visualization of your intestinal tract. The contrast solution may cause some diarrhea. Depending on your individual set of symptoms, you may also receive an intravenous injection of x-ray contrast/dye. Plan on being at [facility] for 45 minutes or longer, depending on the type of exam you are having performed.   If you have any questions regarding your exam or if you need to reschedule, you may call [facility] at [phone] between the hours of 8:00 am and 5:00 pm, Monday-Friday.   _______________________________________________________________  1. Continue Bentyl as needed 2. Push fluids 3. Follow up with your Urologist 4. Call our office if your symptoms worsen 5. To the Emergency Room if you develop severe  Abdominal pain.   Due to recent changes in healthcare laws, you may see the results of your imaging and laboratory studies on MyChart before your provider has had a chance to review them.  We understand that in some cases there may be results that are confusing or concerning to you. Not all laboratory results come back in the same time frame and the provider may be waiting for multiple results in order to interpret others.  Please give [name] 48 hours in order for your provider to thoroughly review all the results before contacting the office for clarification of your results.   Thank you for choosing [facility] [name], CRNP  [phone]

## 2020-03-26 ENCOUNTER — Ambulatory Visit: Payer: Medicare Other | Admitting: Nurse Practitioner

## 2020-03-26 ENCOUNTER — Telehealth (INDEPENDENT_AMBULATORY_CARE_PROVIDER_SITE_OTHER): Payer: Medicare Other | Admitting: Physician Assistant

## 2020-03-26 ENCOUNTER — Other Ambulatory Visit: Payer: Self-pay

## 2020-03-26 DIAGNOSIS — R1030 Lower abdominal pain, unspecified: Secondary | ICD-10-CM

## 2020-03-26 NOTE — Progress Notes (Signed)
Virtual Visit via Video   I connected with patient on 03/26/20 at  1:30 PM EDT by a video enabled telemedicine application and verified that I am speaking with the correct person using two identifiers.  Location patient: Home Location provider: [facility], Office Persons participating in the virtual visit: Patient, Provider, [place] (Patina Moore)  I discussed the limitations of evaluation and management by telemedicine and the availability of in person appointments. The patient expressed understanding and agreed to proceed.  Subjective:   HPI:   Patient presents via [facility] today complaining of greater than 1 month of lower abdominal/bladder cramping.  States when symptoms started they were much milder but still significant.  Thought she had a urinary tract infection.  As such she notes going to see her urologist (Dr. Junious Silk).  States she had a renal ultrasound and was told this was unremarkable.  UA and culture and was told she had a slight infection.  Was given clindamycin x5 days.  States repeat assessment was negative for any continued infection.  Notes symptoms have just gradually worsened.  Notes cramping throughout the day, sometimes very severe in nature.  Is taking her Bentyl given to her by PCP but has not noticed any improvement in her symptoms.  Was evaluated by gastroenterology yesterday with lab work including CBC, CMP and C-reactive protein, all of which were unremarkable.  Is scheduled for CT of her abdomen and pelvis next week.  Of note, patient also with a history of overactive bladder for which she takes Ditropan 15 mg daily.  Denies any current urinary urgency or frequency.  Denies dysuria or hematuria.  Denies low back pain.  Denies vaginal pain, pressure or discharge.  ROS:   See pertinent positives and negatives per HPI.  Patient Active Problem List   Diagnosis Date Noted  . Osteopenia after menopause 06/06/2019  . Bilateral hydronephrosis   .  Degenerative scoliosis 09/07/2018  . Seborrheic dermatitis of scalp 12/02/2017  . Psoriasis 12/02/2017  . DM type 2 with diabetic peripheral neuropathy ([place]) 09/02/2016  . Recurrent kidney stones 08/29/2015  . History of Barrett's esophagus 08/29/2015  . GAD (generalized anxiety disorder) 08/29/2015  . GERD (gastroesophageal reflux disease) 08/29/2015  . Irritable bowel syndrome with constipation 08/29/2015  . Lumbosacral spondylosis with radiculopathy 08/29/2015  . Primary insomnia 10/25/2014  . Acquired hypothyroidism 11/02/2013  . Mixed hyperlipidemia 04/04/2012  . Allergic rhinitis 10/11/2007  . Fibromyalgia 10/11/2007  . Chronic low back pain 10/11/2007    Social History   Tobacco Use  . Smoking status: Never Smoker  . Smokeless tobacco: Never Used  Substance Use Topics  . Alcohol use: Yes    Alcohol/week: 1.0 standard drink    Types: 1 Standard drinks or equivalent per week    Comment: rarely    Current Outpatient Medications:  .  buPROPion (WELLBUTRIN SR) 150 MG 12 hr tablet, Take 1 tablet (150 mg total) by mouth 2 (two) times daily., Disp: 60 tablet, Rfl: 5 .  Cholecalciferol (VITAMIN D) 50 MCG (2000 UT) tablet, Take 2,000 Units by mouth daily. , Disp: , Rfl:  .  clobetasol (TEMOVATE) 0.05 % external solution, Apply 1 application topically 2 (two) times daily. Patient needs office visit. (Patient taking differently: Apply 1 application topically every other day. Patient needs office visit.), Disp: 50 mL, Rfl: 1 .  dicyclomine (BENTYL) 10 MG capsule, TAKE 1 CAPSULE BY MOUTH 4 TIMES A DAY BEFORE MEALS AND AT BEDTIME AS NEEDED, Disp: 60 capsule, Rfl: 0 .  escitalopram (LEXAPRO) 20 MG tablet, TAKE 1 TABLET BY MOUTH EVERY DAY, Disp: 90 tablet, Rfl: 3 .  gabapentin (NEURONTIN) 300 MG capsule, TAKE 1 CAPSULE BY MOUTH THREE TIMES A DAY (Patient taking differently: Take 300 mg by mouth 3 (three) times daily. ), Disp: 270 capsule, Rfl: 2 .  ibuprofen (ADVIL) 400 MG tablet, Take 1  tablet (400 mg total) by mouth every 6 (six) hours as needed., Disp: 30 tablet, Rfl: 0 .  levothyroxine (SYNTHROID) 137 MCG tablet, Take 1 tablet (137 mcg total) by mouth daily before breakfast., Disp: 90 tablet, Rfl: 3 .  lisinopril (ZESTRIL) 5 MG tablet, Take 1 tablet (5 mg total) by mouth daily., Disp: 90 tablet, Rfl: 3 .  meloxicam (MOBIC) 7.5 MG tablet, Take 1 tablet (7.5 mg total) by mouth daily as needed for pain., Disp: 30 tablet, Rfl: 3 .  metFORMIN (GLUCOPHAGE) 500 MG tablet, Take 1 tablet (500 mg total) by mouth 2 (two) times daily with a meal., Disp: 180 tablet, Rfl: 3 .  omeprazole (PRILOSEC) 40 MG capsule, TAKE 1 CAPSULE BY MOUTH EVERY DAY (Patient taking differently: Take 40 mg by mouth daily. ), Disp: 90 capsule, Rfl: 1 .  ondansetron (ZOFRAN ODT) 4 MG disintegrating tablet, Take 1 tablet (4 mg total) by mouth every 8 (eight) hours as needed for nausea or vomiting., Disp: 10 tablet, Rfl: 0 .  OXYBUTYNIN CHLORIDE ER PO, Take 10 mg by mouth daily. Take 1 tablet by mouth daily, Disp: , Rfl:  .  rosuvastatin (CRESTOR) 20 MG tablet, Take 1 tablet (20 mg total) by mouth daily., Disp: 90 tablet, Rfl: 3 .  senna-docusate (SENOKOT-S) 8.6-50 MG tablet, Take 2 tablets by mouth every evening. , Disp: , Rfl:  .  zolpidem (AMBIEN) 10 MG tablet, Take 1 tablet (10 mg total) by mouth at bedtime as needed for sleep. TAKE 1 TABLET BY MOUTH EVERY DAY AT BEDTIME AS NEEDED FOR SLEEP, Disp: 30 tablet, Rfl: 5  No Known Allergies  Objective:   There were no vitals taken for this visit.  Patient is well-developed, well-nourished in no acute distress.  Resting comfortably at home.  Head is normocephalic, atraumatic.  No labored breathing.  Speech is clear and coherent with logical content.  Patient is alert and oriented at baseline.   Assessment and Plan:   1. Lower abdominal pain Ongoing symptoms s/p Urology assessment without cause for ongoing symptoms. GI workup in progress. Recently labs  unremarkable. CT pending. Will switch Bentyl for short trial of Levsin in case related to smooth muscle spasm. She is to come in for UA and culture giving her history just to make certain there is no recurrence of infection giving her concern. Strict ER precautions reviewed with patient who voiced understanding and agreement with plan. She has follow-up already scheduled with her PCP.  - Urinalysis, Routine w reflex microscopic; Future - Urine Culture; Future     Leeanne Rio, PA-C 03/26/2020

## 2020-03-27 ENCOUNTER — Telehealth: Payer: Self-pay | Admitting: Nurse Practitioner

## 2020-03-27 ENCOUNTER — Other Ambulatory Visit: Payer: Self-pay | Admitting: Family Medicine

## 2020-03-27 ENCOUNTER — Other Ambulatory Visit: Payer: Medicare Other

## 2020-03-27 DIAGNOSIS — R1030 Lower abdominal pain, unspecified: Secondary | ICD-10-CM | POA: Diagnosis not present

## 2020-03-27 NOTE — Addendum Note (Signed)
Addended by: Milton Ferguson D on: 03/27/2020 03:11 PM   Modules accepted: Orders

## 2020-03-27 NOTE — Telephone Encounter (Signed)
Miranda Mathis, pls contact the patient and let her know her labs were ok. An abd/pelvic CT scan was ordered at the time of her ov 10/4 to rule out diverticulitis due to LLQ pain, however, I see her CT is not scheduled until 04/05/2020. If she is still having abdominal pain then please change her CTAP with oral and IV contrast to STAT. Pls verify with radiology if they need to adjust her IV contrast as her Bun and Cr are normal but GFR has decreased to 47.14. Thx.

## 2020-03-27 NOTE — Telephone Encounter (Signed)
Called the patient. No answer. Voicemail is not set up. Unable to leave a message.

## 2020-03-28 NOTE — Telephone Encounter (Signed)
Called the patient. No improvement of her symptoms. CT moved to 04/02/20 at 1:00 pm.

## 2020-03-29 ENCOUNTER — Other Ambulatory Visit: Payer: Self-pay | Admitting: Emergency Medicine

## 2020-03-29 ENCOUNTER — Other Ambulatory Visit: Payer: Self-pay | Admitting: Physician Assistant

## 2020-03-29 DIAGNOSIS — N390 Urinary tract infection, site not specified: Secondary | ICD-10-CM

## 2020-03-29 LAB — URINE CULTURE
MICRO NUMBER:: 11039022
SPECIMEN QUALITY:: ADEQUATE

## 2020-03-29 LAB — URINALYSIS, ROUTINE W REFLEX MICROSCOPIC
Bacteria, UA: NONE SEEN /HPF
Bilirubin Urine: NEGATIVE
Glucose, UA: NEGATIVE
Hgb urine dipstick: NEGATIVE
Hyaline Cast: NONE SEEN /LPF
Ketones, ur: NEGATIVE
Nitrite: NEGATIVE
Protein, ur: NEGATIVE
Specific Gravity, Urine: 1.009 (ref 1.001–1.03)
pH: 6 (ref 5.0–8.0)

## 2020-03-29 MED ORDER — AMOXICILLIN-POT CLAVULANATE 500-125 MG PO TABS: 1.0000 | ORAL_TABLET | Freq: Two times a day (BID) | ORAL | 0 refills | Status: AC

## 2020-03-29 MED ORDER — HYOSCYAMINE SULFATE 0.125 MG PO TABS: 0.1250 mg | ORAL_TABLET | ORAL | 0 refills | Status: DC | PRN

## 2020-04-02 ENCOUNTER — Encounter (HOSPITAL_COMMUNITY): Payer: Self-pay

## 2020-04-02 ENCOUNTER — Other Ambulatory Visit: Payer: Self-pay

## 2020-04-02 ENCOUNTER — Ambulatory Visit (HOSPITAL_COMMUNITY)
Admission: RE | Admit: 2020-04-02 | Discharge: 2020-04-02 | Disposition: A | Discharge status: Home / Self Care | Payer: Medicare Other | Source: Ambulatory Visit | Attending: Nurse Practitioner | Admitting: Nurse Practitioner

## 2020-04-02 DIAGNOSIS — K314 Gastric diverticulum: Secondary | ICD-10-CM | POA: Diagnosis not present

## 2020-04-02 DIAGNOSIS — K5790 Diverticulosis of intestine, part unspecified, without perforation or abscess without bleeding: Secondary | ICD-10-CM | POA: Diagnosis not present

## 2020-04-02 DIAGNOSIS — R1032 Left lower quadrant pain: Secondary | ICD-10-CM | POA: Insufficient documentation

## 2020-04-02 DIAGNOSIS — M62838 Other muscle spasm: Secondary | ICD-10-CM | POA: Diagnosis not present

## 2020-04-02 DIAGNOSIS — M6289 Other specified disorders of muscle: Secondary | ICD-10-CM | POA: Diagnosis not present

## 2020-04-02 DIAGNOSIS — R3982 Chronic bladder pain: Secondary | ICD-10-CM | POA: Diagnosis not present

## 2020-04-02 DIAGNOSIS — Z466 Encounter for fitting and adjustment of urinary device: Secondary | ICD-10-CM | POA: Diagnosis not present

## 2020-04-02 DIAGNOSIS — I700 Atherosclerosis of aorta: Secondary | ICD-10-CM | POA: Diagnosis not present

## 2020-04-02 MED ORDER — IOHEXOL 300 MG/ML  SOLN
100.0000 mL | Freq: Once | INTRAMUSCULAR | Status: AC | PRN
  Administered 2020-04-02: 100 mL via INTRAVENOUS

## 2020-04-04 ENCOUNTER — Encounter: Payer: Self-pay | Admitting: Family Medicine

## 2020-04-04 ENCOUNTER — Other Ambulatory Visit: Payer: Self-pay

## 2020-04-04 ENCOUNTER — Telehealth: Payer: Self-pay

## 2020-04-04 ENCOUNTER — Ambulatory Visit: Payer: Medicare Other

## 2020-04-04 ENCOUNTER — Ambulatory Visit (INDEPENDENT_AMBULATORY_CARE_PROVIDER_SITE_OTHER): Payer: Medicare Other | Admitting: Family Medicine

## 2020-04-04 VITALS — BP 108/68 | HR 70 | Temp 97.8°F | Wt 215.4 lb

## 2020-04-04 DIAGNOSIS — G8929 Other chronic pain: Secondary | ICD-10-CM

## 2020-04-04 DIAGNOSIS — F411 Generalized anxiety disorder: Secondary | ICD-10-CM | POA: Diagnosis not present

## 2020-04-04 DIAGNOSIS — M545 Low back pain, unspecified: Secondary | ICD-10-CM

## 2020-04-04 DIAGNOSIS — E1142 Type 2 diabetes mellitus with diabetic polyneuropathy: Secondary | ICD-10-CM | POA: Diagnosis not present

## 2020-04-04 DIAGNOSIS — F321 Major depressive disorder, single episode, moderate: Secondary | ICD-10-CM | POA: Diagnosis not present

## 2020-04-04 DIAGNOSIS — K581 Irritable bowel syndrome with constipation: Secondary | ICD-10-CM | POA: Diagnosis not present

## 2020-04-04 MED ORDER — VORTIOXETINE HBR 10 MG PO TABS: 10.0000 mg | ORAL_TABLET | Freq: Every day | ORAL | 11 refills | Status: DC

## 2020-04-04 MED ORDER — GABAPENTIN 300 MG PO CAPS
600.0000 mg | ORAL_CAPSULE | Freq: Two times a day (BID) | ORAL | 3 refills | Status: DC
Start: 2020-04-04 — End: 2020-07-11

## 2020-04-04 NOTE — Progress Notes (Signed)
Subjective  CC:  Chief Complaint  Patient presents with  . Abdominal Pain    left lower quad, continuous pain, CT performed Tuesday by Dr. Carlean Purl  . mood    "im doing fair"     HPI: Miranda Mathis is a 69 y.o. female who presents to the office today for follow up of diabetes and problems listed above in the chief complaint.   Diabetes follow up: Her diabetic control is reported as Unchanged.  She denies exertional CP or SOB or symptomatic hypoglycemia. She denies foot sores or paresthesias.   Abdominal pain persists: This is her main focus in problem today.  Describes left lower quadrant intermittent cramping moderate to severe pain that comes and goes and radiates to the bladder.  She has had thorough evaluations by neurology and gastroenterology with recent CT scan results reviewed and normal lab work.  She does carry a diagnosis of IBS.  This could be the culprit.  She has follow-up with Dr. Carlean Purl soon.  The only thing that has helped in the past is hydrocodone.  She has never had a problem with chronic narcotic use.  She reports tramadol dulls the pain intermittently.  We reviewed gastroenterology notes together.  Depression: Continues to be active.  She is not hopeless but at times admits she is unmotivated.  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  She denies anxiety or panic symptoms.  Her sleep is mostly good on medications although the abdominal pain is worsening that as well.  We tried adding Wellbutrin which helped mildly but she had adverse effects of tremors.  Since stopping it last week her tremors have resolved.  She has been on Lexapro for years.  She has failed Prozac and Elavil in the past.  Chronic low back pain and peripheral neuropathy on gabapentin 300 3 times daily.  She feels that this medication is helpful.  She would like to increase the dose.  No adverse effects.   Wt Readings from Last 3  Encounters:  04/04/20 215 lb 6.4 oz (97.7 kg)  03/25/20 216 lb 5 oz (98.1 kg)  12/20/19 212 lb (96.2 kg)    BP Readings from Last 3 Encounters:  04/04/20 108/68  03/25/20 120/84  12/20/19 132/84    Assessment  1. DM type 2 with diabetic peripheral neuropathy ([place])   2. Chronic midline low back pain without sciatica   3. Irritable bowel syndrome with constipation   4. GAD (generalized anxiety disorder)   5. Depression, major, single episode, moderate ([place])      Plan   Diabetes is currently well controlled.  We will recheck levels again in 3 months continue current medications  Chronic low back pain and peripheral neuropathy: Increase gabapentin dose to 600 mg twice daily.  Patient says she has a hard time taking the afternoon dose.  We will see if this medication adjustment continues to help relieve her pain  Chronic abdominal pain and IBS: Suspect multifactorial including active depression, IBS.  Urology and gastroenterology are involved.  Counseling given.  Depression and anxiety: Currently active.  Wean Lexapro and start Trintellix.  If unable to afford, will use Zoloft.  Patient understands and agrees with care plan.  See after visit summary for instructions  Follow up: 3 months for recheck. No orders of the defined types were placed in this encounter.  Meds ordered this encounter  Medications  . vortioxetine HBr (TRINTELLIX)  10 MG TABS tablet    Sig: Take 1 tablet (10 mg total) by mouth daily.    Dispense:  30 tablet    Refill:  11  . gabapentin (NEURONTIN) 300 MG capsule    Sig: Take 2 capsules (600 mg total) by mouth 2 (two) times daily.    Dispense:  360 capsule    Refill:  3      Immunization History  Administered Date(s) Administered  . Fluad Quad(high Dose 65+) 04/13/2019  . Influenza Whole 03/22/2001, 03/22/2006, 03/22/2010  . Influenza, High Dose Seasonal PF 02/19/2016, 06/02/2018  . Influenza, Seasonal, Injecte, Preservative Fre 06/27/2012  .  Influenza-Unspecified 04/13/2019  . Moderna SARS-COVID-2 Vaccination 08/14/2019  . PFIZER SARS-COV-2 Vaccination 07/24/2019  . Pneumococcal Conjugate-13 11/27/2015  . Pneumococcal Polysaccharide-23 11/13/2010  . Tdap 11/13/2010  . Zoster 11/27/2015  . Zoster Recombinat (Shingrix) 04/13/2019    Diabetes Related Lab Review: Lab Results  Component Value Date   HGBA1C 6.9 (A) 12/20/2019   HGBA1C 7.1 (H) 11/02/2019   HGBA1C 6.4 (A) 06/06/2019    Lab Results  Component Value Date   MICROALBUR 2.9 (H) 06/06/2019   Lab Results  Component Value Date   CREATININE 1.14 03/25/2020   BUN 18 03/25/2020   NA 139 03/25/2020   K 4.3 03/25/2020   CL 102 03/25/2020   CO2 28 03/25/2020   Lab Results  Component Value Date   CHOL 135 12/20/2019   CHOL 183 06/06/2019   CHOL 152 06/02/2018   Lab Results  Component Value Date   HDL 33.10 (L) 12/20/2019   HDL 36.50 (L) 06/06/2019   HDL 36.20 (L) 06/02/2018   Lab Results  Component Value Date   LDLCALC 80 12/20/2019   LDLCALC 123 (H) 06/06/2019   LDLCALC 97 06/02/2018   Lab Results  Component Value Date   TRIG 108.0 12/20/2019   TRIG 116.0 06/06/2019   TRIG 95.0 06/02/2018   Lab Results  Component Value Date   CHOLHDL 4 12/20/2019   CHOLHDL 5 06/06/2019   CHOLHDL 4 06/02/2018   Lab Results  Component Value Date   LDLDIRECT 165.3 11/13/2010   LDLDIRECT 143.5 08/30/2008   The 10-year ASCVD risk score Mikey Bussing DC Jr., et al., 2013) is: 15%   Values used to calculate the score:     Age: 32 years     Sex: Female     Is Non-Hispanic African American: No     Diabetic: Yes     Tobacco smoker: No     Systolic Blood Pressure: 440 mmHg     Is BP treated: Yes     HDL Cholesterol: 33.1 mg/dL     Total Cholesterol: 135 mg/dL I have reviewed the [facility] and Soc history. Patient Active Problem List   Diagnosis Date Noted  . DM type 2 with diabetic peripheral neuropathy ([place]) 09/02/2016    Priority: High    Diagnosed 2019; started  meds 2020; + microalbunuria on ace   . Recurrent kidney stones 08/29/2015    Priority: High    Overview:  S/p stents, multiple, [facility], Dr. Linton Ham   . GAD (generalized anxiety disorder) 08/29/2015    Priority: High  . Acquired hypothyroidism 11/02/2013    Priority: High  . Mixed hyperlipidemia 04/04/2012    Priority: High  . Fibromyalgia 10/11/2007    Priority: High  . Chronic low back pain 10/11/2007    Priority: High  . Osteopenia after menopause 06/06/2019    Priority: Medium  dexa 2019 T = -1.9, multiple site osteopenia; recheck 2021   . Bilateral hydronephrosis     Priority: Medium  . Degenerative scoliosis 09/07/2018    Priority: Medium  . Psoriasis 12/02/2017    Priority: Medium  . History of Barrett's esophagus 08/29/2015    Priority: Medium    Last EGD 2018, no evidence of Barrett's esophagus, no further EGD recommended   . GERD (gastroesophageal reflux disease) 08/29/2015    Priority: Medium  . Irritable bowel syndrome with constipation 08/29/2015    Priority: Medium  . Lumbosacral spondylosis with radiculopathy 08/29/2015    Priority: Medium  . Primary insomnia 10/25/2014    Priority: Medium    Overview:  Last Assessment & Plan:  Insomnia is stable with current regimen of Ambien. Continue current dosage of Ambien.   . Seborrheic dermatitis of scalp 12/02/2017    Priority: Low  . Allergic rhinitis 10/11/2007    Priority: Low    Qualifier: Diagnosis of  By: Tiney Rouge CMA, Ellison Hughs     . Barrett's esophagus with esophagitis 08/29/2015    Formatting of this note might be different from the original. Last EGD 2018, no evidence of Barrett's esophagus, no further EGD recommended     Social History: Patient  reports that she has never smoked. She has never used smokeless tobacco. She reports current alcohol use of about 1.0 standard drink of alcohol per week. She reports that she does not use drugs.  Review of Systems: Ophthalmic: negative for eye  pain, loss of vision or double vision Cardiovascular: negative for chest pain Respiratory: negative for SOB or persistent cough Gastrointestinal: negative for abdominal pain Genitourinary: negative for dysuria or gross hematuria MSK: negative for foot lesions Neurologic: negative for weakness or gait disturbance  Objective  Vitals: BP 108/68   Pulse 70   Temp 97.8 F (36.6 C) (Temporal)   Wt 215 lb 6.4 oz (97.7 kg)   SpO2 96%   BMI 39.40 kg/m  General: well appearing, no acute distress  Psych:  Alert and oriented, normal mood and affect, tearful HEENT:  Normocephalic, atraumatic, moist mucous membranes, supple neck  Cardiovascular:  Nl S1 and S2, RRR without murmur, gallop or rub. no edema Respiratory:  Good breath sounds bilaterally, CTAB with normal effort, no rales Gastrointestinal: normal BS, soft, left lower quadrant tenderness without rebound or guarding Skin:  Warm, no rashes Neurologic:   Mental status is normal. normal gait Foot exam: no erythema, pallor, or cyanosis visible nl proprioception and sensation to monofilament testing bilaterally, +2 distal pulses bilaterally    Diabetic education: ongoing education regarding chronic disease management for diabetes was given today. We continue to reinforce the ABC's of diabetic management: A1c (<7 or 8 dependent upon patient), tight blood pressure control, and cholesterol management with goal LDL < 100 minimally. We discuss diet strategies, exercise recommendations, medication options and possible side effects. At each visit, we review recommended immunizations and preventive care recommendations for diabetics and stress that good diabetic control can prevent other problems. See below for this patient's data.    Commons side effects, risks, benefits, and alternatives for medications and treatment plan prescribed today were discussed, and the patient expressed understanding of the given instructions. Patient is instructed to call  or message via MyChart if he/she has any questions or concerns regarding our treatment plan. No barriers to understanding were identified. We discussed Red Flag symptoms and signs in detail. Patient expressed understanding regarding what to do in case of urgent or emergency  type symptoms.   Medication list was reconciled, printed and provided to the patient in AVS. Patient instructions and summary information was reviewed with the patient as documented in the AVS. This note was prepared with assistance of Dragon voice recognition software. Occasional wrong-word or sound-a-like substitutions may have occurred due to the inherent limitations of voice recognition software  This visit occurred during the SARS-CoV-2 public health emergency.  Safety protocols were in place, including screening questions prior to the visit, additional usage of staff PPE, and extensive cleaning of exam room while observing appropriate contact time as indicated for disinfecting solutions.

## 2020-04-04 NOTE — Telephone Encounter (Signed)
Patient called back and said the depression medication that Dr. Jonni Sanger sent in was over $400 and wants to see if something else can be sent in since she cannot afford it.

## 2020-04-04 NOTE — Patient Instructions (Addendum)
Please return in 3 months to recheck mood and diabetes.  Wean off the lexapro as follows: Take 10mg  daily for a week (1/2 tablet), Then take 10mg  (1/2 tablet) every other day for a week, Then take 10mg  on M and Th. During this 3rd week, start the new antidepressant daily.  Then stop the lexapro completely.   Let me know if the trintellix is too expensive; I will order zoloft if it is.   If you have any questions or concerns, please don't hesitate to send me a message via MyChart or call the office at [phone]. Thank you for visiting with [name] today! It's our pleasure caring for you.

## 2020-04-05 ENCOUNTER — Ambulatory Visit (HOSPITAL_COMMUNITY): Payer: Medicare Other

## 2020-04-07 NOTE — Telephone Encounter (Signed)
Please advise 

## 2020-04-08 MED ORDER — SERTRALINE HCL 25 MG PO TABS: ORAL_TABLET | ORAL | 1 refills | Status: DC

## 2020-04-08 NOTE — Telephone Encounter (Signed)
Patient aware of new medication being sent into pharmacy

## 2020-04-08 NOTE — Telephone Encounter (Signed)
Ordered zoloft in stead.

## 2020-04-11 DIAGNOSIS — R1032 Left lower quadrant pain: Secondary | ICD-10-CM | POA: Diagnosis not present

## 2020-04-11 DIAGNOSIS — R3982 Chronic bladder pain: Secondary | ICD-10-CM | POA: Diagnosis not present

## 2020-04-11 DIAGNOSIS — M62838 Other muscle spasm: Secondary | ICD-10-CM | POA: Diagnosis not present

## 2020-04-11 DIAGNOSIS — M6289 Other specified disorders of muscle: Secondary | ICD-10-CM | POA: Diagnosis not present

## 2020-04-12 ENCOUNTER — Other Ambulatory Visit: Payer: Self-pay | Admitting: Physician Assistant

## 2020-04-17 DIAGNOSIS — R1032 Left lower quadrant pain: Secondary | ICD-10-CM | POA: Diagnosis not present

## 2020-04-17 DIAGNOSIS — M62838 Other muscle spasm: Secondary | ICD-10-CM | POA: Diagnosis not present

## 2020-04-17 DIAGNOSIS — M6281 Muscle weakness (generalized): Secondary | ICD-10-CM | POA: Diagnosis not present

## 2020-04-17 DIAGNOSIS — R102 Pelvic and perineal pain: Secondary | ICD-10-CM | POA: Diagnosis not present

## 2020-04-17 DIAGNOSIS — M6289 Other specified disorders of muscle: Secondary | ICD-10-CM | POA: Diagnosis not present

## 2020-04-22 ENCOUNTER — Ambulatory Visit (AMBULATORY_SURGERY_CENTER): Payer: Self-pay

## 2020-04-22 ENCOUNTER — Other Ambulatory Visit: Payer: Self-pay

## 2020-04-22 VITALS — Ht 62.0 in | Wt 218.0 lb

## 2020-04-22 DIAGNOSIS — R1032 Left lower quadrant pain: Secondary | ICD-10-CM

## 2020-04-22 DIAGNOSIS — Z1211 Encounter for screening for malignant neoplasm of colon: Secondary | ICD-10-CM

## 2020-04-22 NOTE — Progress Notes (Signed)
No egg or soy allergy known to patient  No issues with past sedation with any surgeries or procedures No intubation problems in the past  No FH of Malignant Hyperthermia No diet pills per patient No home 02 use per patient  No blood thinners per patient  Pt reports issues with constipation --advised Miralax once/twice daily for 5 days prior to procedure; No A fib or A flutter  EMMI video via MyChart;  COVID 19 guidelines implemented in [place] today with Pt and RN  Coupon given to pt in [facility] today , Code to Pharmacy  COVID vaccines completed on 07/2019 per pt; Due to the COVID-19 pandemic we are asking patients to follow these guidelines. Please only bring one care partner. Please be aware that your care partner may wait in the car in the parking lot or if they feel like they will be too hot to wait in the car, they may wait in the lobby on the 4th floor. All care partners are required to wear a mask the entire time (we do not have any that we can provide them), they need to practice social distancing, and we will do a Covid check for all patient's and care partners when you arrive. Also we will check their temperature and your temperature. If the care partner waits in their car they need to stay in the parking lot the entire time and we will call them on their cell phone when the patient is ready for discharge so they can bring the car to the front of the building. Also all patient's will need to wear a mask into building.

## 2020-04-24 ENCOUNTER — Other Ambulatory Visit: Payer: Self-pay | Admitting: Family Medicine

## 2020-04-28 ENCOUNTER — Other Ambulatory Visit: Payer: Self-pay | Admitting: Family Medicine

## 2020-05-01 ENCOUNTER — Other Ambulatory Visit: Payer: Self-pay

## 2020-05-01 ENCOUNTER — Encounter: Payer: Self-pay | Admitting: Internal Medicine

## 2020-05-01 ENCOUNTER — Ambulatory Visit (AMBULATORY_SURGERY_CENTER): Payer: Medicare Other | Admitting: Internal Medicine

## 2020-05-01 VITALS — BP 109/62 | HR 61 | Temp 96.6°F | Resp 16 | Ht 62.0 in | Wt 218.0 lb

## 2020-05-01 DIAGNOSIS — D122 Benign neoplasm of ascending colon: Secondary | ICD-10-CM | POA: Diagnosis not present

## 2020-05-01 DIAGNOSIS — Z1211 Encounter for screening for malignant neoplasm of colon: Secondary | ICD-10-CM | POA: Diagnosis not present

## 2020-05-01 DIAGNOSIS — D123 Benign neoplasm of transverse colon: Secondary | ICD-10-CM | POA: Diagnosis not present

## 2020-05-01 DIAGNOSIS — D124 Benign neoplasm of descending colon: Secondary | ICD-10-CM | POA: Diagnosis not present

## 2020-05-01 DIAGNOSIS — R1030 Lower abdominal pain, unspecified: Secondary | ICD-10-CM | POA: Diagnosis not present

## 2020-05-01 DIAGNOSIS — Z86010 Personal history of colonic polyps: Secondary | ICD-10-CM | POA: Diagnosis not present

## 2020-05-01 MED ORDER — SODIUM CHLORIDE 0.9 % IV SOLN: 500.0000 mL | Freq: Once | INTRAVENOUS | Status: DC

## 2020-05-01 NOTE — Patient Instructions (Addendum)
I found and removed 6 small polyps.  I will let you know pathology results and when to have another routine colonoscopy by mail and/or My Chart.  I appreciate the opportunity to care for you. Connery Shiffler E. Ashika Apuzzo, MD, FACG YOU HAD AN ENDOSCOPIC PROCEDURE TODAY AT THE [facility]:   Refer to the procedure report that was given to you for any specific questions about what was found during the examination.  If the procedure report does not answer your questions, please call your gastroenterologist to clarify.  If you requested that your care partner not be given the details of your procedure findings, then the procedure report has been included in a sealed envelope for you to review at your convenience later.  YOU SHOULD EXPECT: Some feelings of bloating in the abdomen. Passage of more gas than usual.  Walking can help get rid of the air that was put into your GI tract during the procedure and reduce the bloating. If you had a lower endoscopy (such as a colonoscopy or flexible sigmoidoscopy) you may notice spotting of blood in your stool or on the toilet paper. If you underwent a bowel prep for your procedure, you may not have a normal bowel movement for a few days.  Please Note:  You might notice some irritation and congestion in your nose or some drainage.  This is from the oxygen used during your procedure.  There is no need for concern and it should clear up in a day or so.  SYMPTOMS TO REPORT IMMEDIATELY:  Following lower endoscopy (colonoscopy or flexible sigmoidoscopy):  Excessive amounts of blood in the stool  Significant tenderness or worsening of abdominal pains  Swelling of the abdomen that is new, acute  Fever of 100F or higher  For urgent or emergent issues, a gastroenterologist can be reached at any hour by calling [phone]. Do not use MyChart messaging for urgent concerns.    DIET:  We do recommend a small meal at first, but then you may proceed to your regular  diet.  Drink plenty of fluids but you should avoid alcoholic beverages for 24 hours.  ACTIVITY:  You should plan to take it easy for the rest of today and you should NOT DRIVE or use heavy machinery until tomorrow (because of the sedation medicines used during the test).    FOLLOW UP: Our staff will call the number listed on your records 48-72 hours following your procedure to check on you and address any questions or concerns that you may have regarding the information given to you following your procedure. If we do not reach you, we will leave a message.  We will attempt to reach you two times.  During this call, we will ask if you have developed any symptoms of COVID 19. If you develop any symptoms (ie: fever, flu-like symptoms, shortness of breath, cough etc.) before then, please call [phone].  If you test positive for Covid 19 in the 2 weeks post procedure, please call and report this information to us.    If any biopsies were taken you will be contacted by phone or by letter within the next 1-3 weeks.  Please call us at [phone] if you have not heard about the biopsies in 3 weeks.    SIGNATURES/CONFIDENTIALITY: You and/or your care partner have signed paperwork which will be entered into your electronic medical record.  These signatures attest to the fact that that the information above on your After Visit Summary has   has been reviewed and is understood.  Full responsibility of the confidentiality of this discharge information lies with you and/or your care-partner. 

## 2020-05-01 NOTE — Progress Notes (Signed)
Pt's states no medical or surgical changes since previsit or office visit.  Vs by Maura Crandall in adm

## 2020-05-01 NOTE — Progress Notes (Signed)
Called to room to assist during endoscopic procedure.  Patient ID and intended procedure confirmed with present staff. Received instructions for my participation in the procedure from the performing physician.  

## 2020-05-01 NOTE — Progress Notes (Signed)
To PACU, VSS. Report to [email or web address] 

## 2020-05-01 NOTE — Op Note (Signed)
[facility] Patient Name: Miranda Mathis Procedure Date: 05/01/2020 11:46 AM MRN: [id number] Endoscopist: Gatha Mayer , MD Age: 69 Referring MD:  Date of Birth: 1951/05/29 Gender: Female Account #: [id number] Procedure:                Colonoscopy Indications:              Surveillance: Personal history of adenomatous                            polyps on last colonoscopy 3 years ago Medicines:                Propofol per Anesthesia, Monitored Anesthesia Care Procedure:                Pre-Anesthesia Assessment:                           - Prior to the procedure, a History and Physical                            was performed, and patient medications and                            allergies were reviewed. The patient's tolerance of                            previous anesthesia was also reviewed. The risks                            and benefits of the procedure and the sedation                            options and risks were discussed with the patient.                            All questions were answered, and informed consent                            was obtained. Prior Anticoagulants: The patient has                            taken no previous anticoagulant or antiplatelet                            agents. ASA Grade Assessment: III - A patient with                            severe systemic disease. After reviewing the risks                            and benefits, the patient was deemed in                            satisfactory condition to undergo the procedure.  After obtaining informed consent, the colonoscope                            was passed under direct vision. Throughout the                            procedure, the patient's blood pressure, pulse, and                            oxygen saturations were monitored continuously. The                            Colonoscope was introduced through the anus and                             advanced to the the cecum, identified by                            appendiceal orifice and ileocecal valve. The                            colonoscopy was performed without difficulty. The                            patient tolerated the procedure well. The quality                            of the bowel preparation was adequate. The                            ileocecal valve, appendiceal orifice, and rectum                            were photographed. The bowel preparation used was                            Miralax via split dose instruction. Scope In: 12:03:39 PM Scope Out: 12:22:42 PM Scope Withdrawal Time: 0 hours 14 minutes 10 seconds  Total Procedure Duration: 0 hours 19 minutes 3 seconds  Findings:                 The perianal and digital rectal examinations were                            normal.                           Four sessile polyps were found in the transverse                            colon and ascending colon. The polyps were 4 to 6                            mm in size. These polyps were removed with a cold  snare. Resection and retrieval were complete.                            Verification of patient identification for the                            specimen was done. Estimated blood loss was minimal.                           Two sessile polyps were found in the descending                            colon and transverse colon. The polyps were 1 to 2                            mm in size. These polyps were removed with a cold                            biopsy forceps. Resection and retrieval were                            complete. Verification of patient identification                            for the specimen was done. Estimated blood loss was                            minimal.                           The exam was otherwise without abnormality on                            direct and retroflexion views. Complications:             No immediate complications. Estimated Blood Loss:     Estimated blood loss was minimal. Impression:               - Four 4 to 6 mm polyps in the transverse colon and                            in the ascending colon, removed with a cold snare.                            Resected and retrieved.                           - Two 1 to 2 mm polyps in the descending colon and                            in the transverse colon, removed with a cold biopsy                            forceps. Resected and  retrieved.                           - The examination was otherwise normal on direct                            and retroflexion views.                           - Personal history of colonic polyps. Adenomas last                            had 3 2018. Recommendation:           - Patient has a contact number available for                            emergencies. The signs and symptoms of potential                            delayed complications were discussed with the                            patient. Return to normal activities tomorrow.                            Written discharge instructions were provided to the                            patient.                           - Resume previous diet.                           - Continue present medications. LLQ pain sounds                            like it could be neuropathic - ? try TCA, SNRI?                           - Repeat colonoscopy is recommended for                            surveillance. The colonoscopy date will be                            determined after pathology results from today's                            exam become available for review. Gatha Mayer, MD 05/01/2020 12:30:35 PM This report has been signed electronically.

## 2020-05-02 ENCOUNTER — Other Ambulatory Visit: Payer: Self-pay | Admitting: Family Medicine

## 2020-05-02 DIAGNOSIS — E782 Mixed hyperlipidemia: Secondary | ICD-10-CM

## 2020-05-03 ENCOUNTER — Telehealth: Payer: Self-pay

## 2020-05-03 NOTE — Telephone Encounter (Signed)
  Follow up Call-  Call back number 05/01/2020  Post procedure Call Back phone  # [phone]- no voicemail  Permission to leave phone message Yes  Some recent data might be hidden     Patient questions:  Do you have a fever, pain , or abdominal swelling? No. Pain Score  0 *  Have you tolerated food without any problems? Yes.    Have you been able to return to your normal activities? Yes.    Do you have any questions about your discharge instructions: Diet   No. Medications  No. Follow up visit  No.  Do you have questions or concerns about your Care? No.  Actions: * If pain score is 4 or above: No action needed, pain <4.  Have you developed a fever since your procedure? No 2.   Have you had an respiratory symptoms (SOB or cough) since your procedure? No  3.   Have you tested positive for COVID 19 since your procedure No  4.   Have you had any family members/close contacts diagnosed with the COVID 19 since your procedure? No  If yes to any of these questions please route to Joylene John, RN and Joella Prince, RN

## 2020-05-08 ENCOUNTER — Telehealth: Payer: Self-pay | Admitting: Internal Medicine

## 2020-05-08 DIAGNOSIS — Z23 Encounter for immunization: Secondary | ICD-10-CM | POA: Diagnosis not present

## 2020-05-08 NOTE — Telephone Encounter (Signed)
Pt had a colonoscopy done 11/10, pt is requesting a call back from a nurse to discuss her left lower abdominal pain, pt would like some advice of what to do.

## 2020-05-08 NOTE — Telephone Encounter (Signed)
Pt called stating that she was returning a call from Dr. Celesta Aver nurse. Could you please call her again? Thank you.

## 2020-05-08 NOTE — Telephone Encounter (Signed)
Patient reports that she has continued sharp LLQ pain.  She reports no change at all.  Pain is constant. Intermittently sharp.  She is tearful on the phone.  "I need something for the pain".  She reports that she has been given toradol in the past and this does not relieve the pain. "I need pain control".  "I don't know how to tell you how much it hurts"   Dr. Carlean Purl please advise

## 2020-05-08 NOTE — Telephone Encounter (Signed)
Patient notified of Dr. Celesta Aver response.  She is asked to follow up with her PCP

## 2020-05-08 NOTE — Telephone Encounter (Signed)
I know she has pain Have not identified a GI cause  She is on other medications to help pain now - gabapentin and other meds like sertraline so adding in or changing medications to help pain will need to be done by someone prescribing those or perhaps even a pain clinic.  My recommendations are to try adding amitryptyline or changing from sertraline to duloxetine  Will cc her PCP on this

## 2020-05-09 ENCOUNTER — Encounter: Payer: Self-pay | Admitting: Internal Medicine

## 2020-05-10 ENCOUNTER — Other Ambulatory Visit: Payer: Self-pay | Admitting: Physician Assistant

## 2020-05-19 ENCOUNTER — Other Ambulatory Visit: Payer: Self-pay | Admitting: Nurse Practitioner

## 2020-05-21 DIAGNOSIS — N132 Hydronephrosis with renal and ureteral calculous obstruction: Secondary | ICD-10-CM | POA: Diagnosis not present

## 2020-05-21 DIAGNOSIS — N1339 Other hydronephrosis: Secondary | ICD-10-CM | POA: Diagnosis not present

## 2020-05-21 DIAGNOSIS — R935 Abnormal findings on diagnostic imaging of other abdominal regions, including retroperitoneum: Secondary | ICD-10-CM | POA: Diagnosis not present

## 2020-05-21 DIAGNOSIS — Z79899 Other long term (current) drug therapy: Secondary | ICD-10-CM | POA: Diagnosis not present

## 2020-05-22 ENCOUNTER — Telehealth: Payer: Self-pay

## 2020-05-22 NOTE — Telephone Encounter (Signed)
Please schedule patient with Dr. Jonni Sanger next week, use same day if needed

## 2020-05-22 NOTE — Telephone Encounter (Signed)
Please advise if something can be sent in for pain. Also OK for her to see another provider this week?

## 2020-05-22 NOTE — Telephone Encounter (Signed)
I recommend waiting for me for hospital follow-up.  I do not recommend seeing her new provider in this office this week.  I believe she has been treated for her pain with multiple medications.  I will need to review the note to see if I can help further with that.

## 2020-05-22 NOTE — Telephone Encounter (Signed)
Pt went to [facility] ED for her bladder and side pain. She states that figured out what caused it , but they need Dr. Toy Baker help for pain management til she can get in to see her [facility] Urologist on the 7th. She is wanting an ED follow up this week, but Dr. Jonni Sanger has nothing. Please Advise.

## 2020-05-23 NOTE — Telephone Encounter (Signed)
Patient has been scheduled

## 2020-05-27 ENCOUNTER — Ambulatory Visit (INDEPENDENT_AMBULATORY_CARE_PROVIDER_SITE_OTHER): Payer: Medicare Other | Admitting: Family Medicine

## 2020-05-27 ENCOUNTER — Other Ambulatory Visit: Payer: Self-pay

## 2020-05-27 ENCOUNTER — Encounter: Payer: Self-pay | Admitting: Family Medicine

## 2020-05-27 VITALS — BP 112/80 | HR 76 | Temp 97.7°F | Wt 215.4 lb

## 2020-05-27 DIAGNOSIS — R1030 Lower abdominal pain, unspecified: Secondary | ICD-10-CM

## 2020-05-27 DIAGNOSIS — N131 Hydronephrosis with ureteral stricture, not elsewhere classified: Secondary | ICD-10-CM

## 2020-05-27 MED ORDER — HYDROCODONE-ACETAMINOPHEN 10-325 MG PO TABS
1.0000 | ORAL_TABLET | Freq: Four times a day (QID) | ORAL | 0 refills | Status: DC | PRN
Start: 2020-05-27 — End: 2020-12-02

## 2020-05-27 MED ORDER — METFORMIN HCL 500 MG PO TABS: 1000.0000 mg | ORAL_TABLET | Freq: Two times a day (BID) | ORAL | Status: DC

## 2020-05-27 NOTE — Patient Instructions (Signed)
Please follow up as scheduled for your next visit with me: 07/11/2020   If you have any questions or concerns, please don't hesitate to send me a message via MyChart or call the office at [phone]. Thank you for visiting with [name] today! It's our pleasure caring for you.  I have doubled the dose of the hydrocodone; let's see if this will calm down your pain until Dr. Amalia Hailey can help more.   Call me if needed.

## 2020-05-27 NOTE — Progress Notes (Signed)
Subjective  CC:  Chief Complaint  Patient presents with  . Hydronephrosis    progressively worsened over the past 6 months, seen at [facility] ER 05/21/20  . Rash    HPI: Miranda Mathis is a 69 y.o. female who presents to the office today to address the problems listed above in the chief complaint.  Reviewed multiple records from recent ER visit and neurology visits.  Reviewed recent CT scan reports and lab data.  Brief summary: Chronic left lower abdominal pain been evaluated by urology over the last year or so.  Had hydronephrosis, treated with ureteral stent, removed after was pain-free then abrupt recurrence of pain that is been ongoing for months.  Most recent CT scan showed recurrent hydronephrosis.  No infection.  No kidney stone evident.  Lab results are stable.  Patient is tearful due to persistent constant pain that is keeping her awake.  She was given hydrocodone, 10 pills.  She was able to space this out over the last week but it has not controlled her pain.  She is tired.  Fortunately she is seeing Dr. Amalia Hailey at [facility] tomorrow for further evaluation and treatment options.  I also reviewed GI notes.  She had a thorough GI evaluation but did not show any other cause of her lower abdominal pain.  She has been using tramadol for her back pain.  She is no longer on this and this does not help her lower abdominal pain.  No fevers or chills.   Assessment  1. Lower abdominal pain   2. Hydronephrosis with ureteral stricture, not elsewhere classified      Plan   Left lower abdominal pain due to hydronephrosis, recurrent: Hydrocodone 10/325 every 6 hours to help calm down her pain.  She has follow-up tomorrow with urology.  Further treatment options per urology.  Hopefully will be able to replace a stent and help her.  She will follow-up with me afterwards.  Follow up: Has appointment for complete physical in January 07/11/2020  No orders of the defined types were placed in this  encounter.  Meds ordered this encounter  Medications  . metFORMIN (GLUCOPHAGE) 500 MG tablet    Sig: Take 2 tablets (1,000 mg total) by mouth 2 (two) times daily with a meal.  . HYDROcodone-acetaminophen (NORCO) 10-325 MG tablet    Sig: Take 1 tablet by mouth every 6 (six) hours as needed for severe pain.    Dispense:  30 tablet    Refill:  0      I reviewed the patients updated PMH, FH, and SocHx.    Patient Active Problem List   Diagnosis Date Noted  . DM type 2 with diabetic peripheral neuropathy ([place]) 09/02/2016    Priority: High  . Recurrent kidney stones 08/29/2015    Priority: High  . GAD (generalized anxiety disorder) 08/29/2015    Priority: High  . Acquired hypothyroidism 11/02/2013    Priority: High  . Mixed hyperlipidemia 04/04/2012    Priority: High  . Fibromyalgia 10/11/2007    Priority: High  . Chronic low back pain 10/11/2007    Priority: High  . Osteopenia after menopause 06/06/2019    Priority: Medium  . Bilateral hydronephrosis     Priority: Medium  . Degenerative scoliosis 09/07/2018    Priority: Medium  . Psoriasis 12/02/2017    Priority: Medium  . History of Barrett's esophagus 08/29/2015    Priority: Medium  . GERD (gastroesophageal reflux disease) 08/29/2015    Priority: Medium  .  Irritable bowel syndrome with constipation 08/29/2015    Priority: Medium  . Lumbosacral spondylosis with radiculopathy 08/29/2015    Priority: Medium  . Primary insomnia 10/25/2014    Priority: Medium  . Seborrheic dermatitis of scalp 12/02/2017    Priority: Low  . Allergic rhinitis 10/11/2007    Priority: Low  . Barrett's esophagus with esophagitis 08/29/2015   Current Meds  Medication Sig  . Cholecalciferol (VITAMIN D) 50 MCG (2000 UT) tablet Take 2,000 Units by mouth daily.   . fluconazole (DIFLUCAN) 200 MG tablet TAKE 1 TABLET BY MOUTH EVERY 3 DAYS  . gabapentin (NEURONTIN) 300 MG capsule Take 2 capsules (600 mg total) by mouth 2 (two) times daily.  .  hyoscyamine (LEVSIN) 0.125 MG tablet TAKE 1 TABLET (0.125 MG TOTAL) BY MOUTH EVERY 4 (FOUR) HOURS AS NEEDED.  [name] levothyroxine (SYNTHROID) 137 MCG tablet TAKE 1 TABLET (137 MCG TOTAL) BY MOUTH DAILY BEFORE BREAKFAST.  [name] lisinopril (ZESTRIL) 5 MG tablet Take 1 tablet (5 mg total) by mouth daily.  . meloxicam (MOBIC) 7.5 MG tablet TAKE 1 TABLET BY MOUTH DAILY AS NEEDED FOR PAIN  . metFORMIN (GLUCOPHAGE) 500 MG tablet Take 2 tablets (1,000 mg total) by mouth 2 (two) times daily with a meal.  . omeprazole (PRILOSEC) 40 MG capsule TAKE 1 CAPSULE BY MOUTH EVERY DAY (Patient taking differently: Take 40 mg by mouth daily. )  . ondansetron (ZOFRAN ODT) 4 MG disintegrating tablet Take 1 tablet (4 mg total) by mouth every 8 (eight) hours as needed for nausea or vomiting.  . rosuvastatin (CRESTOR) 20 MG tablet TAKE 1 TABLET BY MOUTH EVERY DAY  . senna-docusate (SENOKOT-S) 8.6-50 MG tablet Take 2 tablets by mouth every evening.   . sertraline (ZOLOFT) 50 MG tablet Take 50 mg by mouth daily.  [name] zolpidem (AMBIEN) 10 MG tablet Take 1 tablet (10 mg total) by mouth at bedtime as needed for sleep. TAKE 1 TABLET BY MOUTH EVERY DAY AT BEDTIME AS NEEDED FOR SLEEP  . [DISCONTINUED] ACETAMINOPHEN PO Take by mouth 3 (three) times daily as needed.  . [DISCONTINUED] metFORMIN (GLUCOPHAGE) 500 MG tablet Take 1 tablet (500 mg total) by mouth 2 (two) times daily with a meal. (Patient taking differently: Take 1,000 mg by mouth 2 (two) times daily with a meal. )  . [DISCONTINUED] sertraline (ZOLOFT) 50 MG tablet Take 50 mg by mouth daily.    Allergies: Patient has No Known Allergies. Family History: Patient family history includes Arthritis in her brother, daughter, father, maternal grandmother, and mother; Asthma in her daughter; Birth defects in her daughter and father; COPD in her mother; Cancer in her father; Coronary artery disease in her paternal grandfather; Depression in her daughter; Diabetes in her paternal aunt;  Emphysema in her mother; Hearing loss in her daughter; Hyperlipidemia in her brother; Hypertension in her father and mother; Kidney Stones in her daughter; Mental retardation in her daughter; Osteoarthritis in her mother; Prostate cancer in her father; Rheum arthritis in her mother. Social History:  Patient  reports that she has never smoked. She has never used smokeless tobacco. She reports current alcohol use of about 1.0 standard drink of alcohol per week. She reports that she does not use drugs.  Review of Systems: Constitutional: Negative for fever malaise or anorexia Cardiovascular: negative for chest pain Respiratory: negative for SOB or persistent cough Gastrointestinal: negative for abdominal pain  Objective  Vitals: BP 112/80   Pulse 76   Temp 97.7 F (36.5 C) (Temporal)  Wt 215 lb 6.4 oz (97.7 kg)   SpO2 96%   BMI 39.40 kg/m  General: no acute distress , A&Ox3 She is crying due to pain.  She is holding her left lower abdomen.  She is nontoxic appearing    Commons side effects, risks, benefits, and alternatives for medications and treatment plan prescribed today were discussed, and the patient expressed understanding of the given instructions. Patient is instructed to call or message via MyChart if he/she has any questions or concerns regarding our treatment plan. No barriers to understanding were identified. We discussed Red Flag symptoms and signs in detail. Patient expressed understanding regarding what to do in case of urgent or emergency type symptoms.   Medication list was reconciled, printed and provided to the patient in AVS. Patient instructions and summary information was reviewed with the patient as documented in the AVS. This note was prepared with assistance of Dragon voice recognition software. Occasional wrong-word or sound-a-like substitutions may have occurred due to the inherent limitations of voice recognition software  This visit occurred during the  SARS-CoV-2 public health emergency.  Safety protocols were in place, including screening questions prior to the visit, additional usage of staff PPE, and extensive cleaning of exam room while observing appropriate contact time as indicated for disinfecting solutions.

## 2020-05-28 DIAGNOSIS — N1330 Unspecified hydronephrosis: Secondary | ICD-10-CM | POA: Diagnosis not present

## 2020-05-28 DIAGNOSIS — N131 Hydronephrosis with ureteral stricture, not elsewhere classified: Secondary | ICD-10-CM | POA: Diagnosis not present

## 2020-05-28 DIAGNOSIS — R8290 Unspecified abnormal findings in urine: Secondary | ICD-10-CM | POA: Diagnosis not present

## 2020-05-28 DIAGNOSIS — N135 Crossing vessel and stricture of ureter without hydronephrosis: Secondary | ICD-10-CM | POA: Diagnosis not present

## 2020-05-29 ENCOUNTER — Telehealth: Payer: Self-pay

## 2020-05-29 NOTE — Telephone Encounter (Signed)
Saw urologist yesterday surgery Monday evaluation or stents or taking tubes apart and putting them back together.

## 2020-05-30 NOTE — Telephone Encounter (Signed)
See below

## 2020-06-03 DIAGNOSIS — N131 Hydronephrosis with ureteral stricture, not elsewhere classified: Secondary | ICD-10-CM | POA: Diagnosis not present

## 2020-06-03 DIAGNOSIS — N1339 Other hydronephrosis: Secondary | ICD-10-CM | POA: Diagnosis not present

## 2020-06-03 DIAGNOSIS — E1142 Type 2 diabetes mellitus with diabetic polyneuropathy: Secondary | ICD-10-CM | POA: Diagnosis not present

## 2020-06-07 ENCOUNTER — Emergency Department (HOSPITAL_COMMUNITY): Payer: Medicare Other

## 2020-06-07 ENCOUNTER — Other Ambulatory Visit: Payer: Self-pay

## 2020-06-07 ENCOUNTER — Encounter (HOSPITAL_COMMUNITY): Payer: Self-pay | Admitting: Emergency Medicine

## 2020-06-07 ENCOUNTER — Emergency Department (HOSPITAL_COMMUNITY)
Admission: EM | Admit: 2020-06-07 | Discharge: 2020-06-08 | Disposition: A | Discharge status: Home / Self Care | Payer: Medicare Other | Attending: Emergency Medicine | Admitting: Emergency Medicine

## 2020-06-07 DIAGNOSIS — M47816 Spondylosis without myelopathy or radiculopathy, lumbar region: Secondary | ICD-10-CM | POA: Diagnosis not present

## 2020-06-07 DIAGNOSIS — E039 Hypothyroidism, unspecified: Secondary | ICD-10-CM | POA: Diagnosis not present

## 2020-06-07 DIAGNOSIS — E1140 Type 2 diabetes mellitus with diabetic neuropathy, unspecified: Secondary | ICD-10-CM | POA: Diagnosis not present

## 2020-06-07 DIAGNOSIS — N1330 Unspecified hydronephrosis: Secondary | ICD-10-CM | POA: Diagnosis not present

## 2020-06-07 DIAGNOSIS — K219 Gastro-esophageal reflux disease without esophagitis: Secondary | ICD-10-CM | POA: Insufficient documentation

## 2020-06-07 DIAGNOSIS — R1084 Generalized abdominal pain: Secondary | ICD-10-CM | POA: Diagnosis not present

## 2020-06-07 DIAGNOSIS — Z87442 Personal history of urinary calculi: Secondary | ICD-10-CM | POA: Insufficient documentation

## 2020-06-07 DIAGNOSIS — Z79899 Other long term (current) drug therapy: Secondary | ICD-10-CM | POA: Insufficient documentation

## 2020-06-07 DIAGNOSIS — R109 Unspecified abdominal pain: Secondary | ICD-10-CM | POA: Diagnosis present

## 2020-06-07 DIAGNOSIS — M545 Low back pain, unspecified: Secondary | ICD-10-CM | POA: Diagnosis not present

## 2020-06-07 DIAGNOSIS — I700 Atherosclerosis of aorta: Secondary | ICD-10-CM | POA: Diagnosis not present

## 2020-06-07 DIAGNOSIS — Z7984 Long term (current) use of oral hypoglycemic drugs: Secondary | ICD-10-CM | POA: Insufficient documentation

## 2020-06-07 DIAGNOSIS — N12 Tubulo-interstitial nephritis, not specified as acute or chronic: Secondary | ICD-10-CM | POA: Diagnosis not present

## 2020-06-07 DIAGNOSIS — K5730 Diverticulosis of large intestine without perforation or abscess without bleeding: Secondary | ICD-10-CM | POA: Diagnosis not present

## 2020-06-07 DIAGNOSIS — I10 Essential (primary) hypertension: Secondary | ICD-10-CM | POA: Insufficient documentation

## 2020-06-07 DIAGNOSIS — R52 Pain, unspecified: Secondary | ICD-10-CM | POA: Diagnosis not present

## 2020-06-07 DIAGNOSIS — M549 Dorsalgia, unspecified: Secondary | ICD-10-CM | POA: Diagnosis not present

## 2020-06-07 LAB — CBC
HCT: 39.6 % (ref 36.0–46.0)
Hemoglobin: 12.6 g/dL (ref 12.0–15.0)
MCH: 28.1 pg (ref 26.0–34.0)
MCHC: 31.8 g/dL (ref 30.0–36.0)
MCV: 88.4 fL (ref 80.0–100.0)
Platelets: 185 10*3/uL (ref 150–400)
RBC: 4.48 MIL/uL (ref 3.87–5.11)
RDW: 14.1 % (ref 11.5–15.5)
WBC: 8.2 10*3/uL (ref 4.0–10.5)
nRBC: 0 % (ref 0.0–0.2)

## 2020-06-07 LAB — COMPREHENSIVE METABOLIC PANEL
ALT: 38 U/L (ref 0–44)
AST: 44 U/L — ABNORMAL HIGH (ref 15–41)
Albumin: 3.7 g/dL (ref 3.5–5.0)
Alkaline Phosphatase: 52 U/L (ref 38–126)
Anion gap: 11 (ref 5–15)
BUN: 16 mg/dL (ref 8–23)
CO2: 22 mmol/L (ref 22–32)
Calcium: 9.3 mg/dL (ref 8.9–10.3)
Chloride: 106 mmol/L (ref 98–111)
Creatinine, Ser: 1.03 mg/dL — ABNORMAL HIGH (ref 0.44–1.00)
GFR, Estimated: 58 mL/min — ABNORMAL LOW (ref >60)
Glucose, Bld: 163 mg/dL — ABNORMAL HIGH (ref 70–99)
Potassium: 3.5 mmol/L (ref 3.5–5.1)
Sodium: 139 mmol/L (ref 135–145)
Total Bilirubin: 0.6 mg/dL (ref 0.3–1.2)
Total Protein: 6.7 g/dL (ref 6.5–8.1)

## 2020-06-07 LAB — URINALYSIS, ROUTINE W REFLEX MICROSCOPIC
Bilirubin Urine: NEGATIVE
Glucose, UA: NEGATIVE mg/dL
Ketones, ur: 5 mg/dL — AB
Nitrite: POSITIVE — AB
Protein, ur: >=300 mg/dL — AB
RBC / HPF: >50 RBC/hpf — ABNORMAL HIGH (ref 0–5)
Specific Gravity, Urine: 1.015 (ref 1.005–1.030)
pH: 7 (ref 5.0–8.0)

## 2020-06-07 LAB — LIPASE, BLOOD: Lipase: 19 U/L (ref 11–51)

## 2020-06-07 MED ORDER — ONDANSETRON 4 MG PO TBDP
4.0000 mg | ORAL_TABLET | Freq: Once | ORAL | Status: AC | PRN
  Administered 2020-06-08: 4 mg via ORAL
  Filled 2020-06-07: qty 1

## 2020-06-07 MED ORDER — HYDROMORPHONE HCL 1 MG/ML IJ SOLN
0.5000 mg | Freq: Once | INTRAMUSCULAR | Status: AC
  Administered 2020-06-08: 0.5 mg via INTRAVENOUS
  Filled 2020-06-07: qty 1

## 2020-06-07 MED ORDER — KETOROLAC TROMETHAMINE 15 MG/ML IJ SOLN
15.0000 mg | Freq: Once | INTRAMUSCULAR | Status: AC
  Administered 2020-06-08: 15 mg via INTRAVENOUS
  Filled 2020-06-07: qty 1

## 2020-06-07 MED ORDER — SODIUM CHLORIDE 0.9 % IV SOLN
1.0000 g | Freq: Once | INTRAVENOUS | Status: AC
  Administered 2020-06-08: 1 g via INTRAVENOUS
  Filled 2020-06-07: qty 10

## 2020-06-07 NOTE — ED Provider Notes (Signed)
[facility] DEPT Provider Note  CSN: [id number] Arrival date & time: 06/07/20 1923  Chief Complaint(s) Abdominal Pain  HPI Miranda Mathis is a 69 y.o. female with a past medical history listed below including ureteral stricture who presents to the emergency department with left flank pain.  Patient had a ureteral stent placed earlier in the week.  She began having pain the following day.  Has gradually worsened since onset and not being controlled by her home pain medicine.  Patient is also endorsing nausea without emesis.  No alleviating or aggravating factors.  No fevers or chills.  No diarrhea.  No other physical complaints.  HPI  Past Medical History Past Medical History:  Diagnosis Date  . Allergic rhinitis   . Allergy    seasonal allergies  . Barrett's esophagus   . Bladder spasms   . Chronic back pain   . DDD (degenerative disc disease), lumbar    on meds  . Degenerative scoliosis   . Depression    on meds  . Diabetes mellitus without complication ([place])    on meds  . Diabetic peripheral neuropathy ([place])   . Fibromyalgia   . Full dentures   . GAD (generalized anxiety disorder)    on meds  . GERD (gastroesophageal reflux disease)    on meds  . History of bladder stone   . History of colon polyps   . History of kidney stones   . History of migraine   . History of recurrent UTIs   . History of sepsis    10-18-2018  bacterium-ecoli blood culture  . Hydronephrosis   . Hydronephrosis, bilateral    due to DM  . Hyperlipidemia    on meds  . Hypertension    on meds  . Hypothyroidism 11/02/2013 dx   followed by pcp- on meds  . IBS (irritable bowel syndrome)   . Insomnia   . Osteopenia   . Osteopenia after menopause 06/06/2019   dexa 2019 T = -1.9, multiple site osteopenia; recheck 2021  . Peripheral neuropathy   . Psoriasis   . Restless leg syndrome   . Seborrheic dermatitis of scalp   . Spondylolysis of lumbar region   . Wears  glasses    Patient Active Problem List   Diagnosis Date Noted  . Osteopenia after menopause 06/06/2019  . Bilateral hydronephrosis   . Degenerative scoliosis 09/07/2018  . Seborrheic dermatitis of scalp 12/02/2017  . Psoriasis 12/02/2017  . DM type 2 with diabetic peripheral neuropathy ([place]) 09/02/2016  . Recurrent kidney stones 08/29/2015  . History of Barrett's esophagus 08/29/2015  . GAD (generalized anxiety disorder) 08/29/2015  . GERD (gastroesophageal reflux disease) 08/29/2015  . Irritable bowel syndrome with constipation 08/29/2015  . Lumbosacral spondylosis with radiculopathy 08/29/2015  . Barrett's esophagus with esophagitis 08/29/2015  . Primary insomnia 10/25/2014  . Acquired hypothyroidism 11/02/2013  . Mixed hyperlipidemia 04/04/2012  . Allergic rhinitis 10/11/2007  . Fibromyalgia 10/11/2007  . Chronic low back pain 10/11/2007   Home Medication(s) Prior to Admission medications   Medication Sig Start Date End Date Taking? Authorizing Provider  cephALEXin (KEFLEX) 500 MG capsule Take 1 capsule (500 mg total) by mouth 3 (three) times daily for 10 days. 06/08/20 06/18/20  Fatima Blank, MD  Cholecalciferol (VITAMIN D) 50 MCG (2000 UT) tablet Take 2,000 Units by mouth daily.     [provider]  fluconazole (DIFLUCAN) 200 MG tablet TAKE 1 TABLET BY MOUTH EVERY 3 DAYS 05/13/20  Lavonna Monarch, MD  gabapentin (NEURONTIN) 300 MG capsule Take 2 capsules (600 mg total) by mouth 2 (two) times daily. 04/04/20   Leamon Arnt, MD  HYDROcodone-acetaminophen [facility]) 10-325 MG tablet Take 1 tablet by mouth every 6 (six) hours as needed for severe pain. 05/27/20   Leamon Arnt, MD  hyoscyamine (LEVSIN) 0.125 MG tablet TAKE 1 TABLET (0.125 MG TOTAL) BY MOUTH EVERY 4 (FOUR) HOURS AS NEEDED. 05/21/20   [name], NP  levothyroxine (SYNTHROID) 137 MCG tablet TAKE 1 TABLET (137 MCG TOTAL) BY MOUTH DAILY BEFORE BREAKFAST. 03/27/20   Leamon Arnt, MD   lisinopril (ZESTRIL) 5 MG tablet Take 1 tablet (5 mg total) by mouth daily. 08/09/19   Leamon Arnt, MD  meloxicam (MOBIC) 7.5 MG tablet TAKE 1 TABLET BY MOUTH DAILY AS NEEDED FOR PAIN 04/24/20   Leamon Arnt, MD  metFORMIN (GLUCOPHAGE) 500 MG tablet Take 2 tablets (1,000 mg total) by mouth 2 (two) times daily with a meal. 05/27/20   Leamon Arnt, MD  omeprazole (PRILOSEC) 40 MG capsule TAKE 1 CAPSULE BY MOUTH EVERY DAY Patient taking differently: Take 40 mg by mouth daily.  10/23/19   Leamon Arnt, MD  ondansetron (ZOFRAN ODT) 4 MG disintegrating tablet Take 1 tablet (4 mg total) by mouth every 8 (eight) hours as needed for up to 3 days for nausea or vomiting. 06/08/20 06/11/20  Katherleen Folkes, Grayce Sessions, MD  oxyCODONE-acetaminophen (PERCOCET) 5-325 MG tablet Take 1 tablet by mouth every 8 (eight) hours as needed for up to 5 days for severe pain. Please do not exceed 4000 mg of acetaminophen (Tylenol) a 24-hour period. Please note that he may be prescribed additional medicine that contains acetaminophen. 06/08/20 06/13/20  Fatima Blank, MD  rosuvastatin (CRESTOR) 20 MG tablet TAKE 1 TABLET BY MOUTH EVERY DAY 05/03/20   Orma Flaming, MD  senna-docusate (SENOKOT-S) 8.6-50 MG tablet Take 2 tablets by mouth every evening.     [provider]  sertraline (ZOLOFT) 50 MG tablet Take 50 mg by mouth daily.    [provider]  zolpidem (AMBIEN) 10 MG tablet Take 1 tablet (10 mg total) by mouth at bedtime as needed for sleep. TAKE 1 TABLET BY MOUTH EVERY DAY AT BEDTIME AS NEEDED FOR SLEEP 02/12/20   Leamon Arnt, MD  pravastatin (PRAVACHOL) 40 MG tablet Take 1 tablet (40 mg total) by mouth daily. 01/26/19 06/06/19  Leamon Arnt, MD                                                                                                                                    Past Surgical History Past Surgical History:  Procedure Laterality Date  . ABDOMINAL HYSTERECTOMY  1987  .  BALLOON DILATION  07/10/2011   Procedure: BALLOON DILATION;  Surgeon: Fredricka Bonine, MD;  Location: [facility];  Service: Urology;  Laterality: Right;  .  CESAREAN SECTION  X2  . CHOLECYSTECTOMY    . COLONOSCOPY  2018   at [place] (results not availabe at this time)  . CYSTO/ BILATERAL URETEROSCOPY / URETERAL BX'S/ BILATERAL URETERAL STENT PLACEMENT/ BLADDER STONE EXTRACTION  03-13-2011  . CYSTOSCOPY W/ RETROGRADES  07/10/2011   Procedure: CYSTOSCOPY WITH RETROGRADE PYELOGRAM;  Surgeon: Fredricka Bonine, MD;  Location[facility];  Service: Urology;  Laterality: Right;  . CYSTOSCOPY W/ URETERAL STENT PLACEMENT  07/10/2011   Procedure: CYSTOSCOPY WITH STENT REPLACEMENT;  Surgeon: Fredricka Bonine, MD;  [facility];  Service: Urology;  Laterality: Right;  . CYSTOSCOPY W/ URETERAL STENT PLACEMENT Bilateral 10/22/2018   Procedure: CYSTOSCOPY WITH RETROGRADE PYELOGRAM/URETERAL STENT PLACEMENT-BILATERAL;  Surgeon: Ardis Hughs, MD;  Location: [facility];  Service: Urology;  Laterality: Bilateral;  . CYSTOSCOPY W/ URETERAL STENT REMOVAL  07/10/2011   Procedure: CYSTOSCOPY WITH STENT REMOVAL;  Surgeon: Fredricka Bonine, MD;  Location: [facility];  Service: Urology;  Laterality: Right;  . CYSTOSCOPY WITH URETEROSCOPY AND STENT PLACEMENT Bilateral 11/29/2018   Procedure: CYSTOSCOPY WITH BILATERAL URETEROSCOPY AND STENT EXCHANGE/ WITH LEFT BRUSH BIOPSY;  Surgeon: Festus Aloe, MD;  Location: [facility];  Service: Urology;  Laterality: Bilateral;  . CYSTOSCOPY/RETROGRADE/URETEROSCOPY Left 11/14/2019   Procedure: CYSTOSCOPY/RETROGRADE/URETEROSCOPY/ BIOPSY/ STENT PLACEMENT;  Surgeon: Festus Aloe, MD;  Location: [facility];  Service: Urology;  Laterality: Left;  . LUMBAR MICRODISCECTOMY  1990'S   L5 - S1  . RIGHT FOOT SURG  2003   [facility]  . RIGHT URETEROSCOPIC / URETERAL BX/ STENT PLACEMENT   04-17-2011  . UPPER GASTROINTESTINAL ENDOSCOPY  2018   @ [facility] -results not available at this time  . UPPER GI ENDOSCOPY     Family History Family History  Problem Relation Age of Onset  . COPD Mother   . Emphysema Mother   . Osteoarthritis Mother   . Rheum arthritis Mother   . Arthritis Mother   . Hypertension Mother   . Prostate cancer Father        w/mets  . Arthritis Father   . Birth defects Father   . Cancer Father   . Hypertension Father   . Arthritis Daughter   . Asthma Daughter   . Birth defects Daughter   . Depression Daughter   . Hearing loss Daughter   . Mental retardation Daughter   . Kidney Stones Daughter   . Arthritis Brother   . Hyperlipidemia Brother   . Coronary artery disease Paternal Grandfather   . Diabetes Paternal Aunt        x 3  . Arthritis Maternal Grandmother   . Colon cancer Neg Hx   . Colon polyps Neg Hx   . Esophageal cancer Neg Hx   . Stomach cancer Neg Hx   . Rectal cancer Neg Hx     Social History Social History   Tobacco Use  . Smoking status: Never Smoker  . Smokeless tobacco: Never Used  Vaping Use  . Vaping Use: Never used  Substance Use Topics  . Alcohol use: Yes    Alcohol/week: 1.0 standard drink    Types: 1 Standard drinks or equivalent per week    Comment: rarely  . Drug use: No   Allergies Ketorolac  Review of Systems Review of Systems All other systems are reviewed and are negative for acute change except as noted in the HPI  Physical Exam Vital Signs  I have reviewed the triage vital signs BP  125/60 (BP Location: Left Arm)   Pulse (!) 58   Temp 98.3 F (36.8 C) (Oral)   Resp 13   Ht 5\' 2"  (1.575 m)   Wt 97.5 kg   SpO2 99%   BMI 39.32 kg/m   Physical Exam Vitals reviewed.  Constitutional:      General: She is not in acute distress.    Appearance: She is well-developed and well-nourished. She is obese. She is not diaphoretic.  HENT:     Head: Normocephalic and atraumatic.     Right Ear:  External ear normal.     Left Ear: External ear normal.     Nose: Nose normal.  Eyes:     General: No scleral icterus.    Extraocular Movements: EOM normal.     Conjunctiva/sclera: Conjunctivae normal.  Neck:     Trachea: Phonation normal.  Cardiovascular:     Rate and Rhythm: Normal rate and regular rhythm.  Pulmonary:     Effort: Pulmonary effort is normal. No respiratory distress.     Breath sounds: No stridor.  Abdominal:     General: There is no distension.     Tenderness: There is abdominal tenderness in the left lower quadrant. There is left CVA tenderness.  Musculoskeletal:        General: No edema. Normal range of motion.     Cervical back: Normal range of motion.  Neurological:     Mental Status: She is alert and oriented to person, place, and time.  Psychiatric:        Mood and Affect: Mood and affect normal.        Behavior: Behavior normal.     ED Results and Treatments Labs (all labs ordered are listed, but only abnormal results are displayed) Labs Reviewed  COMPREHENSIVE METABOLIC PANEL - Abnormal; Notable for the following components:      Result Value   Glucose, Bld 163 (*)    Creatinine, Ser 1.03 (*)    AST 44 (*)    GFR, Estimated 58 (*)    All other components within normal limits  URINALYSIS, ROUTINE W REFLEX MICROSCOPIC - Abnormal; Notable for the following components:   Color, Urine AMBER (*)    Hgb urine dipstick MODERATE (*)    Ketones, ur 5 (*)    Protein, ur >=300 (*)    Nitrite POSITIVE (*)    Leukocytes,Ua TRACE (*)    RBC / HPF >50 (*)    Bacteria, UA RARE (*)    All other components within normal limits  LIPASE, BLOOD  CBC                                                                                                                         EKG  EKG Interpretation  Date/Time:    Ventricular Rate:    PR Interval:    QRS Duration:   QT Interval:    QTC Calculation:   R Axis:     Text  Interpretation:        Radiology CT  Renal Stone Study  Result Date: 06/08/2020 CLINICAL DATA:  Lower abdominal pain. EXAM: CT ABDOMEN AND PELVIS WITHOUT CONTRAST TECHNIQUE: Multidetector CT imaging of the abdomen and pelvis was performed following the standard protocol without IV contrast. COMPARISON:  April 02, 2020 FINDINGS: Lower chest: No acute abnormality. Hepatobiliary: No focal liver abnormality is seen. Status post cholecystectomy. No biliary dilatation. Pancreas: Unremarkable. No pancreatic ductal dilatation or surrounding inflammatory changes. Spleen: Normal in size without focal abnormality. Adrenals/Urinary Tract: Adrenal glands are unremarkable. Kidneys are normal in size, without focal lesions. A left-sided endo ureteral stent is in place. This represents a new finding when compared to the prior study. Persistent mild left-sided hydronephrosis is seen. Mild left-sided perinephric inflammatory fat stranding is also noted. No renal calculi are identified. Bladder is unremarkable. Stomach/Bowel: Stomach is within normal limits. Appendix appears normal. No evidence of bowel wall thickening, distention, or inflammatory changes. Noninflamed diverticula are seen within the sigmoid colon. Vascular/Lymphatic: Aortic atherosclerosis. No enlarged abdominal or pelvic lymph nodes. Reproductive: Status post hysterectomy. Small stable bilateral adnexal cysts are seen. Other: No abdominal wall hernia or abnormality. No abdominopelvic ascites. Musculoskeletal: Degenerative changes seen within the lower lumbar spine. IMPRESSION: 1. Interval placement of a left-sided endo ureteral stent since the prior exam with persistent mild left-sided hydronephrosis. 2. Additional findings which may represent sequelae associated with left-sided acute pyelonephritis. 3. Sigmoid diverticulosis. 4. Aortic atherosclerosis Aortic Atherosclerosis (ICD10-I70.0). Electronically Signed   By: Virgina Norfolk M.D.   On: 06/08/2020 00:27    Pertinent labs & imaging  results that were available during my care of the patient were reviewed by me and considered in my medical decision making (see chart for details).  Medications Ordered in ED Medications  ondansetron (ZOFRAN-ODT) disintegrating tablet 4 mg (4 mg Oral Given 06/08/20 0008)  ketorolac (TORADOL) 15 MG/ML injection 15 mg (15 mg Intravenous Given 06/08/20 0005)  HYDROmorphone (DILAUDID) injection 0.5 mg (0.5 mg Intravenous Given 06/08/20 0006)  cefTRIAXone (ROCEPHIN) 1 g in sodium chloride 0.9 % 100 mL IVPB (0 g Intravenous Stopped 06/08/20 0142)  oxyCODONE-acetaminophen (PERCOCET/ROXICET) 5-325 MG per tablet 1 tablet (1 tablet Oral Given 06/08/20 0142)                                                                                                                                    Procedures Procedures  (including critical care time)  Medical Decision Making / ED Course I have reviewed the nursing notes for this encounter and the patient's prior records (if available in EHR or on provided paperwork).   Ellice R Winterhalter was evaluated in Emergency Department on 06/08/2020 for the symptoms described in the history of present illness. She was evaluated in the context of the global COVID-19 pandemic, which necessitated consideration that the patient might be at risk for infection with the SARS-CoV-2 virus that causes COVID-19. Institutional protocols  and algorithms that pertain to the evaluation of patients at risk for COVID-19 are in a state of rapid change based on information released by regulatory bodies including the CDC and federal and state organizations. These policies and algorithms were followed during the patient's care in the ED.  Work-up concerning for pyelonephritis.  She is not septic and tolerating oral intakes.  She was treated with a dose of IV Rocephin.  Provided with pain and nausea medicine here.  Feel she is appropriate for outpatient management.  Recommended urology follow-up.       Final Clinical Impression(s) / ED Diagnoses Final diagnoses:  Pyelonephritis   The patient appears reasonably screened and/or stabilized for discharge and I doubt any other medical condition or other [facility] requiring further screening, evaluation, or treatment in the ED at this time prior to discharge. Safe for discharge with strict return precautions.  Disposition: Discharge  Condition: Good  I have discussed the results, Dx and Tx plan with the patient/family who expressed understanding and agree(s) with the plan. Discharge instructions discussed at length. The patient/family was given strict return precautions who verbalized understanding of the instructions. No further questions at time of discharge.    ED Discharge Orders         Ordered    cephALEXin (KEFLEX) 500 MG capsule  3 times daily        06/08/20 0137    oxyCODONE-acetaminophen (PERCOCET) 5-325 MG tablet  Every 8 hours PRN        06/08/20 0243    ondansetron (ZOFRAN ODT) 4 MG disintegrating tablet  Every 8 hours PRN        06/08/20 0244            Follow Up: Urology  Call  To schedule an appointment for close follow up  Leamon Arnt, [place]  [phone]  Call  As needed      This chart was dictated using voice recognition software.  Despite best efforts to proofread,  errors can occur which can change the documentation meaning.   Fatima Blank, MD 06/08/20 [phone]

## 2020-06-07 NOTE — ED Triage Notes (Signed)
Patient is complaining of lower abdominal pain and lower back pain. Patient has a hx of uti's. Patient brought in by [facility] from home. Patient had a stent placed on Monday. Patient is complaining of low output of urine.

## 2020-06-08 DIAGNOSIS — M47816 Spondylosis without myelopathy or radiculopathy, lumbar region: Secondary | ICD-10-CM | POA: Diagnosis not present

## 2020-06-08 DIAGNOSIS — I700 Atherosclerosis of aorta: Secondary | ICD-10-CM | POA: Diagnosis not present

## 2020-06-08 DIAGNOSIS — N12 Tubulo-interstitial nephritis, not specified as acute or chronic: Secondary | ICD-10-CM | POA: Diagnosis not present

## 2020-06-08 DIAGNOSIS — K5730 Diverticulosis of large intestine without perforation or abscess without bleeding: Secondary | ICD-10-CM | POA: Diagnosis not present

## 2020-06-08 DIAGNOSIS — N1330 Unspecified hydronephrosis: Secondary | ICD-10-CM | POA: Diagnosis not present

## 2020-06-08 MED ORDER — ONDANSETRON 4 MG PO TBDP: 4.0000 mg | ORAL_TABLET | Freq: Three times a day (TID) | ORAL | 0 refills | Status: AC | PRN

## 2020-06-08 MED ORDER — OXYCODONE-ACETAMINOPHEN 5-325 MG PO TABS: 1.0000 | ORAL_TABLET | Freq: Three times a day (TID) | ORAL | 0 refills | Status: AC | PRN

## 2020-06-08 MED ORDER — OXYCODONE-ACETAMINOPHEN 5-325 MG PO TABS
1.0000 | ORAL_TABLET | Freq: Once | ORAL | Status: AC
Start: 2020-06-08 — End: 2020-06-08
  Administered 2020-06-08: 1 via ORAL
  Filled 2020-06-08: qty 1

## 2020-06-08 MED ORDER — CEPHALEXIN 500 MG PO CAPS: 500.0000 mg | ORAL_CAPSULE | Freq: Three times a day (TID) | ORAL | 0 refills | Status: AC

## 2020-06-11 ENCOUNTER — Other Ambulatory Visit: Payer: Self-pay | Admitting: Family Medicine

## 2020-06-11 ENCOUNTER — Telehealth: Payer: Self-pay

## 2020-06-11 ENCOUNTER — Other Ambulatory Visit: Payer: Self-pay

## 2020-06-11 NOTE — Telephone Encounter (Signed)
Last refill: 02/12/20 #30, 5 Last OV: 05/27/20 dx. Lower abdominal pain

## 2020-06-11 NOTE — Telephone Encounter (Signed)
.   LAST APPOINTMENT DATE: 06/11/2020   NEXT APPOINTMENT DATE:@1 /20/2022  MEDICATION:zolpidem (AMBIEN) 10 MG tablet  metFORMIN (GLUCOPHAGE) 500 MG tablet  PHARMACY:  [id number] - [place], [place] - [name]  hey can request refills through [facility]**  CLINICAL FILLS OUT ALL BELOW:   LAST REFILL:  QTY:  REFILL DATE:    OTHER COMMENTS:    Okay for refill?  Please advise

## 2020-06-11 NOTE — Telephone Encounter (Signed)
Metformin script sent to pharmacy. Ambien refill request sent to PCP.

## 2020-06-12 MED ORDER — ZOLPIDEM TARTRATE 10 MG PO TABS: 10.0000 mg | ORAL_TABLET | Freq: Every evening | ORAL | 5 refills | Status: DC | PRN

## 2020-06-23 ENCOUNTER — Other Ambulatory Visit: Payer: Self-pay | Admitting: Family Medicine

## 2020-07-11 ENCOUNTER — Encounter: Payer: Self-pay | Admitting: Family Medicine

## 2020-07-11 ENCOUNTER — Other Ambulatory Visit: Payer: Self-pay

## 2020-07-11 ENCOUNTER — Ambulatory Visit (INDEPENDENT_AMBULATORY_CARE_PROVIDER_SITE_OTHER): Payer: Medicare Other | Admitting: Family Medicine

## 2020-07-11 VITALS — BP 116/72 | HR 71 | Temp 97.3°F | Wt 208.8 lb

## 2020-07-11 DIAGNOSIS — F411 Generalized anxiety disorder: Secondary | ICD-10-CM | POA: Diagnosis not present

## 2020-07-11 DIAGNOSIS — E782 Mixed hyperlipidemia: Secondary | ICD-10-CM | POA: Diagnosis not present

## 2020-07-11 DIAGNOSIS — F321 Major depressive disorder, single episode, moderate: Secondary | ICD-10-CM

## 2020-07-11 DIAGNOSIS — E1142 Type 2 diabetes mellitus with diabetic polyneuropathy: Secondary | ICD-10-CM

## 2020-07-11 DIAGNOSIS — N200 Calculus of kidney: Secondary | ICD-10-CM

## 2020-07-11 DIAGNOSIS — F5101 Primary insomnia: Secondary | ICD-10-CM

## 2020-07-11 DIAGNOSIS — E039 Hypothyroidism, unspecified: Secondary | ICD-10-CM

## 2020-07-11 DIAGNOSIS — M797 Fibromyalgia: Secondary | ICD-10-CM

## 2020-07-11 LAB — CBC WITH DIFFERENTIAL/PLATELET
Basophils Absolute: 0 10*3/uL (ref 0.0–0.1)
Basophils Relative: 0.5 % (ref 0.0–3.0)
Eosinophils Absolute: 0.3 10*3/uL (ref 0.0–0.7)
Eosinophils Relative: 3.8 % (ref 0.0–5.0)
HCT: 39.3 % (ref 36.0–46.0)
Hemoglobin: 12.9 g/dL (ref 12.0–15.0)
Lymphocytes Relative: 20.3 % (ref 12.0–46.0)
Lymphs Abs: 1.5 10*3/uL (ref 0.7–4.0)
MCHC: 32.7 g/dL (ref 30.0–36.0)
MCV: 86.6 fl (ref 78.0–100.0)
Monocytes Absolute: 0.4 10*3/uL (ref 0.1–1.0)
Monocytes Relative: 5.9 % (ref 3.0–12.0)
Neutro Abs: 5.1 10*3/uL (ref 1.4–7.7)
Neutrophils Relative %: 69.5 % (ref 43.0–77.0)
Platelets: 213 10*3/uL (ref 150.0–400.0)
RBC: 4.54 Mil/uL (ref 3.87–5.11)
RDW: 15 % (ref 11.5–15.5)
WBC: 7.3 10*3/uL (ref 4.0–10.5)

## 2020-07-11 LAB — COMPREHENSIVE METABOLIC PANEL
ALT: 30 U/L (ref 0–35)
AST: 33 U/L (ref 0–37)
Albumin: 4.1 g/dL (ref 3.5–5.2)
Alkaline Phosphatase: 58 U/L (ref 39–117)
BUN: 14 mg/dL (ref 6–23)
CO2: 31 mEq/L (ref 19–32)
Calcium: 9.7 mg/dL (ref 8.4–10.5)
Chloride: 105 mEq/L (ref 96–112)
Creatinine, Ser: 1.14 mg/dL (ref 0.40–1.20)
GFR: 48.91 mL/min — ABNORMAL LOW (ref >60.00)
Glucose, Bld: 114 mg/dL — ABNORMAL HIGH (ref 70–99)
Potassium: 4.3 mEq/L (ref 3.5–5.1)
Sodium: 141 mEq/L (ref 135–145)
Total Bilirubin: 0.5 mg/dL (ref 0.2–1.2)
Total Protein: 7.2 g/dL (ref 6.0–8.3)

## 2020-07-11 LAB — LIPID PANEL
Cholesterol: 143 mg/dL (ref 0–200)
HDL: 34.8 mg/dL — ABNORMAL LOW (ref >39.00)
LDL Cholesterol: 86 mg/dL (ref 0–99)
NonHDL: 107.81
Total CHOL/HDL Ratio: 4
Triglycerides: 108 mg/dL (ref 0.0–149.0)
VLDL: 21.6 mg/dL (ref 0.0–40.0)

## 2020-07-11 LAB — POCT GLYCOSYLATED HEMOGLOBIN (HGB A1C): Hemoglobin A1C: 6.7 % — AB (ref 4.0–5.6)

## 2020-07-11 LAB — TSH: TSH: 9.31 u[IU]/mL — ABNORMAL HIGH (ref 0.35–4.50)

## 2020-07-11 MED ORDER — GABAPENTIN 300 MG PO CAPS: 600.0000 mg | ORAL_CAPSULE | Freq: Three times a day (TID) | ORAL | 3 refills | Status: DC

## 2020-07-11 MED ORDER — SERTRALINE HCL 100 MG PO TABS: 100.0000 mg | ORAL_TABLET | Freq: Every day | ORAL | 3 refills | Status: DC

## 2020-07-11 NOTE — Progress Notes (Signed)
Subjective  CC:  Chief Complaint  Patient presents with  . Diabetes  . bladder spasms    Constant, taking Oxybutynin 4-5 times daily   . Depression    Patient states that PCP started her on Celexa 10 mg and she takes 5 every morning. I do not see on current med list or in previous appt notes     HPI: Miranda Mathis is a 70 y.o. female who presents to the office today for follow up of diabetes and problems listed above in the chief complaint.   Diabetes follow up: Her diabetic control is reported as Unchanged. She denies symptoms of hypoglycemia.  Diet is fair. She denies exertional CP or SOB or symptomatic hypoglycemia. She denies foot sores or paresthesias.   Hyperlipidemia with LDL above 70 when checked 6 months ago.  She is on Crestor 20 mg nightly.  Nonfasting today.  Due for recheck.  No adverse effects.  Hypothyroidism: Energy level is fair.  Tired due to pain.  See below.  Due for recheck.  Compliant with medication.  Has been well controlled.  Left lower abdominal pain.  Patient was found to have recurrent kidney stones.  Had pyelonephritis.  Since she has had a ureteral stent placed.  However she still complains of significant pain.  Her urologist put her on Ditropan.  She has no fevers or dysuria at this time.  She reports that she had a follow-up urine test that was negative.  I do not have those records currently.  Depression and anxiety.  I hello Terry significant [name] how are you working this morning and then relocated Raynaud's her camera was off automatically is always tried me.  EMR would come back and then it just take me off and that it will not allow me to sign back on anything.  Negative I had can visit shows me your home pager-4-5 [employer] Sunday diabetes it well like yesterday while at work yesterday as I turned it by pat off turned back then when I hit the leg it went to the [employer] teams and it made me sign it again with her using the that screen will pop up  just goes on to know cannot now her joint now we will do so feeling that she has not signed it because like overhead like rubber that clever is cannot cannot connect to server cannot connect to certain what the Internet is working so I can go to Electronics engineer is connected to the Internet but for some reason it seems  She is not signed did he has what abnormal how it affects [place] happen and was there in IT person and help so I could drop it off and go over or lisinopril.  Right right.  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  We started sertraline about 3 months ago.  She feels that it may have been helping a little bit.  Denies true panic attacks.  She is overwhelmed with her life stressors including caring for her special needs daughter, COVID pandemic, persistent pain without clearly identified source.  Fibromyalgia  she is having significant pain and likely fibromyalgia is contributing.  We discussed how stressors and anxiety symptoms can worsen her pain syndrome.  Concerned that there is another cause for her abdominal pain but we reviewed her prior work-up today.  Wt Readings from Last 3 Encounters:  07/11/20 208 lb 12.8 oz (94.7 kg)  06/07/20 215 lb (97.5 kg)  05/27/20 215 lb 6.4 oz (97.7 kg)    BP Readings from Last 3 Encounters:  07/11/20 116/72  06/08/20 125/60  05/27/20 112/80    Assessment  1. DM type 2 with diabetic peripheral neuropathy ([place])   2. Recurrent kidney stones   3. Mixed hyperlipidemia   4. Acquired hypothyroidism   5. Fibromyalgia   6. Depression, major, single episode, moderate (HCC) Chronic  7. GAD (generalized anxiety disorder)    8. Primary insomnia      Plan   Diabetes is currently well controlled.  Continue metformin.  Recheck renal function and electrolytes.  Recurrent kidney stone, status post stenting with bladder spasms and left lower quadrant pain: Believe pain may be related to recent kidney problems however also exacerbated by fibromyalgia.  She has follow-up with urology.  Continue Ditropan.  Fibromyalgia and anxiety disorder: Both are very active.  Counseling and education given.  We will work on calm these down which I think will help her pain.  Increase gabapentin to 600 3 times daily.  Increase Zoloft to 100 mg daily.  Work on IT trainer.  Discussed etiology of pain that is exacerbated by fibromyalgia and anxiety flares.  Reassured.  No other GI disorders have been identified.  She has had a thorough GI evaluation.  Recheck blood work today.  Hypothyroidism: Check TSH and adjust medications if needed.  Hyperlipidemia: Recheck LDL on statin.  Adjust dose to push LDL below 70.  Check LFTs.  Primary insomnia and Ambien.  And nighttime dose of gabapentin.  Follow up: 3 months for follow-up diabetes, fibromyalgia and pain. Orders Placed This Encounter  Procedures  . CBC with Differential/Platelet  . Comprehensive metabolic panel  . Lipid panel  . TSH   Meds ordered this encounter  Medications  . gabapentin (NEURONTIN) 300 MG capsule    Sig: Take 2 capsules (600 mg total) by mouth 3 (three) times daily.    Dispense:  360 capsule    Refill:  3  . sertraline (ZOLOFT) 100 MG tablet    Sig: Take 1 tablet (100 mg total) by mouth daily.    Dispense:  90 tablet    Refill:  3      Immunization History  Administered Date(s) Administered  . Fluad Quad(high Dose 65+) 04/13/2019, 04/04/2020  . Influenza Whole 03/22/2001, 03/22/2006, 03/22/2010  . Influenza, High Dose Seasonal PF 02/19/2016, 06/02/2018  . Influenza, Seasonal, Injecte, Preservative Fre 06/27/2012  . Influenza-Unspecified  04/13/2019  . Moderna Sars-Covid-2 Vaccination 08/14/2019  . PFIZER(Purple Top)SARS-COV-2 Vaccination 07/24/2019, 05/08/2020  . Pneumococcal Conjugate-13 11/27/2015  . Pneumococcal Polysaccharide-23 11/13/2010  . Tdap 11/13/2010  . Zoster 11/27/2015  . Zoster Recombinat (Shingrix) 04/13/2019    Diabetes Related Lab Review: Lab Results  Component Value Date   HGBA1C 6.9 (A) 12/20/2019   HGBA1C 7.1 (H) 11/02/2019   HGBA1C 6.4 (A) 06/06/2019    Lab Results  Component Value Date   MICROALBUR 2.9 (H) 06/06/2019   Lab Results  Component Value Date   CREATININE 1.03 (H) 06/07/2020   BUN 16 06/07/2020   NA 139 06/07/2020   K 3.5 06/07/2020   CL  106 06/07/2020   CO2 22 06/07/2020   Lab Results  Component Value Date   CHOL 135 12/20/2019   CHOL 183 06/06/2019   CHOL 152 06/02/2018   Lab Results  Component Value Date   HDL 33.10 (L) 12/20/2019   HDL 36.50 (L) 06/06/2019   HDL 36.20 (L) 06/02/2018   Lab Results  Component Value Date   LDLCALC 80 12/20/2019   LDLCALC 123 (H) 06/06/2019   LDLCALC 97 06/02/2018   Lab Results  Component Value Date   TRIG 108.0 12/20/2019   TRIG 116.0 06/06/2019   TRIG 95.0 06/02/2018   Lab Results  Component Value Date   CHOLHDL 4 12/20/2019   CHOLHDL 5 06/06/2019   CHOLHDL 4 06/02/2018   Lab Results  Component Value Date   LDLDIRECT 165.3 11/13/2010   LDLDIRECT 143.5 08/30/2008   The 10-year ASCVD risk score Denman George DC Jr., et al., 2013) is: 18.9%   Values used to calculate the score:     Age: 32 years     Sex: Female     Is Non-Hispanic African American: No     Diabetic: Yes     Tobacco smoker: No     Systolic Blood Pressure: 116 mmHg     Is BP treated: Yes     HDL Cholesterol: 33.1 mg/dL     Total Cholesterol: 135 mg/dL I have reviewed the PMH, Fam and Soc history. Patient Active Problem List   Diagnosis Date Noted  . DM type 2 with diabetic peripheral neuropathy (HCC) 09/02/2016    Priority: High    Diagnosed 2019;  started meds 2020; + microalbunuria on ace   . Recurrent kidney stones 08/29/2015    Priority: High    Overview:  S/p stents, multiple, [facility], Dr. Lenore Cordia   . GAD (generalized anxiety disorder) 08/29/2015    Priority: High  . Acquired hypothyroidism 11/02/2013    Priority: High  . Mixed hyperlipidemia 04/04/2012    Priority: High  . Fibromyalgia 10/11/2007    Priority: High  . Chronic low back pain 10/11/2007    Priority: High  . Osteopenia after menopause 06/06/2019    Priority: Medium    dexa 2019 T = -1.9, multiple site osteopenia; recheck 2021   . Bilateral hydronephrosis     Priority: Medium  . Degenerative scoliosis 09/07/2018    Priority: Medium  . Psoriasis 12/02/2017    Priority: Medium  . History of Barrett's esophagus 08/29/2015    Priority: Medium    Last EGD 2018, no evidence of Barrett's esophagus, no further EGD recommended   . GERD (gastroesophageal reflux disease) 08/29/2015    Priority: Medium  . Irritable bowel syndrome with constipation 08/29/2015    Priority: Medium  . Lumbosacral spondylosis with radiculopathy 08/29/2015    Priority: Medium  . Primary insomnia 10/25/2014    Priority: Medium    Overview:  Last Assessment & Plan:  Insomnia is stable with current regimen of Ambien. Continue current dosage of Ambien.   . Seborrheic dermatitis of scalp 12/02/2017    Priority: Low  . Allergic rhinitis 10/11/2007    Priority: Low    Qualifier: Diagnosis of  By: Briscoe Burns CMA, Alvy Beal     . Depression, major, single episode, moderate (HCC) 07/11/2020  . Barrett's esophagus with esophagitis 08/29/2015    Formatting of this note might be different from the original. Last EGD 2018, no evidence of Barrett's esophagus, no further EGD recommended     Social History: Patient  reports that  she has never smoked. She has never used smokeless tobacco. She reports current alcohol use of about 1.0 standard drink of alcohol per week. She reports that she  does not use drugs.  Review of Systems: Ophthalmic: negative for eye pain, loss of vision or double vision Cardiovascular: negative for chest pain Respiratory: negative for SOB or persistent cough Gastrointestinal: negative for abdominal pain Genitourinary: negative for dysuria or gross hematuria MSK: negative for foot lesions Neurologic: negative for weakness or gait disturbance  Objective  Vitals: BP 116/72   Pulse 71   Temp (!) 97.3 F (36.3 C) (Temporal)   Wt 208 lb 12.8 oz (94.7 kg)   SpO2 96%   BMI 38.19 kg/m  General: well appearing, no acute distress but has acute episodes of pain related to "bladder spasms" Psych:  Alert and oriented, anxious today   Diabetic education: ongoing education regarding chronic disease management for diabetes was given today. We continue to reinforce the ABC's of diabetic management: A1c (<7 or 8 dependent upon patient), tight blood pressure control, and cholesterol management with goal LDL < 100 minimally. We discuss diet strategies, exercise recommendations, medication options and possible side effects. At each visit, we review recommended immunizations and preventive care recommendations for diabetics and stress that good diabetic control can prevent other problems. See below for this patient's data.    Commons side effects, risks, benefits, and alternatives for medications and treatment plan prescribed today were discussed, and the patient expressed understanding of the given instructions. Patient is instructed to call or message via MyChart if he/she has any questions or concerns regarding our treatment plan. No barriers to understanding were identified. We discussed Red Flag symptoms and signs in detail. Patient expressed understanding regarding what to do in case of urgent or emergency type symptoms.   Medication list was reconciled, printed and provided to the patient in AVS. Patient instructions and summary information was reviewed with the  patient as documented in the AVS. This note was prepared with assistance of Dragon voice recognition software. Occasional wrong-word or sound-a-like substitutions may have occurred due to the inherent limitations of voice recognition software  This visit occurred during the SARS-CoV-2 public health emergency.  Safety protocols were in place, including screening questions prior to the visit, additional usage of staff PPE, and extensive cleaning of exam room while observing appropriate contact time as indicated for disinfecting solutions.

## 2020-07-11 NOTE — Patient Instructions (Signed)
Please return in 3 months for recheck.   Increase your gabapentin dose to 600mg  3x/day to help your fibromyalgia pain.  I've increased your sertraline to 100mg  daily to help your anxiety.  Sleep well, eat well and work on calming your nerves: relaxation and breathing exercises can be helpful.   I will release your lab results to you on your MyChart account with further instructions. Please reply with any questions.    If you have any questions or concerns, please don't hesitate to send me a message via MyChart or call the office at [phone]. Thank you for visiting with Miranda Mathis today! It's our pleasure caring for you.

## 2020-07-11 NOTE — Addendum Note (Signed)
Addended by: Doran Clay A on: 07/11/2020 02:52 PM   Modules accepted: Orders

## 2020-07-15 ENCOUNTER — Other Ambulatory Visit: Payer: Self-pay

## 2020-07-15 MED ORDER — ROSUVASTATIN CALCIUM 40 MG PO TABS: 40.0000 mg | ORAL_TABLET | Freq: Every day | ORAL | 3 refills | Status: DC

## 2020-08-01 ENCOUNTER — Other Ambulatory Visit: Payer: Self-pay

## 2020-08-01 ENCOUNTER — Encounter: Payer: Self-pay | Admitting: Physician Assistant

## 2020-08-01 ENCOUNTER — Ambulatory Visit (INDEPENDENT_AMBULATORY_CARE_PROVIDER_SITE_OTHER): Payer: Medicare Other | Admitting: Physician Assistant

## 2020-08-01 DIAGNOSIS — L409 Psoriasis, unspecified: Secondary | ICD-10-CM | POA: Diagnosis not present

## 2020-08-01 DIAGNOSIS — N1330 Unspecified hydronephrosis: Secondary | ICD-10-CM | POA: Diagnosis not present

## 2020-08-01 DIAGNOSIS — R8290 Unspecified abnormal findings in urine: Secondary | ICD-10-CM | POA: Diagnosis not present

## 2020-08-01 DIAGNOSIS — Z09 Encounter for follow-up examination after completed treatment for conditions other than malignant neoplasm: Secondary | ICD-10-CM | POA: Diagnosis not present

## 2020-08-01 MED ORDER — FLUOCINONIDE EMULSIFIED BASE 0.05 % EX CREA: TOPICAL_CREAM | CUTANEOUS | 6 refills | Status: DC

## 2020-08-01 MED ORDER — ALCLOMETASONE DIPROPIONATE 0.05 % EX CREA: TOPICAL_CREAM | CUTANEOUS | 6 refills | Status: DC

## 2020-08-01 MED ORDER — CLOBETASOL PROPIONATE 0.05 % EX SOLN
1.0000 | Freq: Two times a day (BID) | CUTANEOUS | 6 refills | Status: DC
Start: 2020-08-01 — End: 2020-11-26

## 2020-08-03 ENCOUNTER — Other Ambulatory Visit: Payer: Self-pay | Admitting: Family Medicine

## 2020-08-05 ENCOUNTER — Ambulatory Visit (INDEPENDENT_AMBULATORY_CARE_PROVIDER_SITE_OTHER): Payer: Medicare Other

## 2020-08-05 DIAGNOSIS — Z0000 Encounter for general adult medical examination without abnormal findings: Secondary | ICD-10-CM | POA: Diagnosis not present

## 2020-08-05 NOTE — Patient Instructions (Addendum)
Miranda Mathis , Thank you for taking time to come for your Medicare Wellness Visit. I appreciate your ongoing commitment to your health goals. Please review the following plan we discussed and let me know if I can assist you in the future.   Screening recommendations/referrals: Colonoscopy: Done 05/01/20 Mammogram: Declined and dioscussed Bone Density: Declined and dfiscussed Recommended yearly ophthalmology/optometry visit for glaucoma screening and checkup Recommended yearly dental visit for hygiene and checkup  Vaccinations: Influenza vaccine: Done 04/04/20 Pneumococcal vaccine: Up to date Tdap vaccine: Due and discussed Shingles vaccine: 1st dose 04/13/19 2nd dose to be confirmed Covid-19:Completed 2/1, 2/22, & 05/08/20  Advanced directives: Advance directive discussed with you today. I have provided a copy for you to complete at home and have notarized. Once this is complete please bring a copy in to our office so we can scan it into your chart.  Conditions/risks identified: Maintain health  Next appointment: Follow up in one year for your annual wellness visit    Preventive Care 65 Years and Older, Female Preventive care refers to lifestyle choices and visits with your health care provider that can promote health and wellness. What does preventive care include?  A yearly physical exam. This is also called an annual well check.  Dental exams once or twice a year.  Routine eye exams. Ask your health care provider how often you should have your eyes checked.  Personal lifestyle choices, including:  Daily care of your teeth and gums.  Regular physical activity.  Eating a healthy diet.  Avoiding tobacco and drug use.  Limiting alcohol use.  Practicing safe sex.  Taking low-dose aspirin every day.  Taking vitamin and mineral supplements as recommended by your health care provider. What happens during an annual well check? The services and screenings done by your health  care provider during your annual well check will depend on your age, overall health, lifestyle risk factors, and family history of disease. Counseling  Your health care provider may ask you questions about your:  Alcohol use.  Tobacco use.  Drug use.  Emotional well-being.  Home and relationship well-being.  Sexual activity.  Eating habits.  History of falls.  Memory and ability to understand (cognition).  Work and work Statistician.  Reproductive health. Screening  You may have the following tests or measurements:  Height, weight, and BMI.  Blood pressure.  Lipid and cholesterol levels. These may be checked every 5 years, or more frequently if you are over 98 years old.  Skin check.  Lung cancer screening. You may have this screening every year starting at age 26 if you have a 30-pack-year history of smoking and currently smoke or have quit within the past 15 years.  Fecal occult blood test (FOBT) of the stool. You may have this test every year starting at age 68.  Flexible sigmoidoscopy or colonoscopy. You may have a sigmoidoscopy every 5 years or a colonoscopy every 10 years starting at age 8.  Hepatitis C blood test.  Hepatitis B blood test.  Sexually transmitted disease (STD) testing.  Diabetes screening. This is done by checking your blood sugar (glucose) after you have not eaten for a while (fasting). You may have this done every 1-3 years.  Bone density scan. This is done to screen for osteoporosis. You may have this done starting at age 20.  Mammogram. This may be done every 1-2 years. Talk to your health care provider about how often you should have regular mammograms. Talk with your health care  provider about your test results, treatment options, and if necessary, the need for more tests. Vaccines  Your health care provider may recommend certain vaccines, such as:  Influenza vaccine. This is recommended every year.  Tetanus, diphtheria, and  acellular pertussis (Tdap, Td) vaccine. You may need a Td booster every 10 years.  Zoster vaccine. You may need this after age 8.  Pneumococcal 13-valent conjugate (PCV13) vaccine. One dose is recommended after age 22.  Pneumococcal polysaccharide (PPSV23) vaccine. One dose is recommended after age 40. Talk to your health care provider about which screenings and vaccines you need and how often you need them. This information is not intended to replace advice given to you by your health care provider. Make sure you discuss any questions you have with your health care provider. Document Released: 07/05/2015 Document Revised: 02/26/2016 Document Reviewed: 04/09/2015 Elsevier Interactive Patient Education  2017 [employer] Prevention in the Home Falls can cause injuries. They can happen to people of all ages. There are many things you can do to make your home safe and to help prevent falls. What can I do on the outside of my home?  Regularly fix the edges of walkways and driveways and fix any cracks.  Remove anything that might make you trip as you walk through a door, such as a raised step or threshold.  Trim any bushes or trees on the path to your home.  Use bright outdoor lighting.  Clear any walking paths of anything that might make someone trip, such as rocks or tools.  Regularly check to see if handrails are loose or broken. Make sure that both sides of any steps have handrails.  Any raised decks and porches should have guardrails on the edges.  Have any leaves, snow, or ice cleared regularly.  Use sand or salt on walking paths during winter.  Clean up any spills in your garage right away. This includes oil or grease spills. What can I do in the bathroom?  Use night lights.  Install grab bars by the toilet and in the tub and shower. Do not use towel bars as grab bars.  Use non-skid mats or decals in the tub or shower.  If you need to sit down in the shower, use  a plastic, non-slip stool.  Keep the floor dry. Clean up any water that spills on the floor as soon as it happens.  Remove soap buildup in the tub or shower regularly.  Attach bath mats securely with double-sided non-slip rug tape.  Do not have throw rugs and other things on the floor that can make you trip. What can I do in the bedroom?  Use night lights.  Make sure that you have a light by your bed that is easy to reach.  Do not use any sheets or blankets that are too big for your bed. They should not hang down onto the floor.  Have a firm chair that has side arms. You can use this for support while you get dressed.  Do not have throw rugs and other things on the floor that can make you trip. What can I do in the kitchen?  Clean up any spills right away.  Avoid walking on wet floors.  Keep items that you use a lot in easy-to-reach places.  If you need to reach something above you, use a strong step stool that has a grab bar.  Keep electrical cords out of the way.  Do not use floor polish  or wax that makes floors slippery. If you must use wax, use non-skid floor wax.  Do not have throw rugs and other things on the floor that can make you trip. What can I do with my stairs?  Do not leave any items on the stairs.  Make sure that there are handrails on both sides of the stairs and use them. Fix handrails that are broken or loose. Make sure that handrails are as long as the stairways.  Check any carpeting to make sure that it is firmly attached to the stairs. Fix any carpet that is loose or worn.  Avoid having throw rugs at the top or bottom of the stairs. If you do have throw rugs, attach them to the floor with carpet tape.  Make sure that you have a light switch at the top of the stairs and the bottom of the stairs. If you do not have them, ask someone to add them for you. What else can I do to help prevent falls?  Wear shoes that:  Do not have high heels.  Have  rubber bottoms.  Are comfortable and fit you well.  Are closed at the toe. Do not wear sandals.  If you use a stepladder:  Make sure that it is fully opened. Do not climb a closed stepladder.  Make sure that both sides of the stepladder are locked into place.  Ask someone to [place] it for you, if possible.  Clearly mark and make sure that you can see:  Any grab bars or handrails.  First and last steps.  Where the edge of each step is.  Use tools that help you move around (mobility aids) if they are needed. These include:  Canes.  Walkers.  Scooters.  Crutches.  Turn on the lights when you go into a dark area. Replace any light bulbs as soon as they burn out.  Set up your furniture so you have a clear path. Avoid moving your furniture around.  If any of your floors are uneven, fix them.  If there are any pets around you, be aware of where they are.  Review your medicines with your doctor. Some medicines can make you feel dizzy. This can increase your chance of falling. Ask your doctor what other things that you can do to help prevent falls. This information is not intended to replace advice given to you by your health care provider. Make sure you discuss any questions you have with your health care provider. Document Released: 04/04/2009 Document Revised: 11/14/2015 Document Reviewed: 07/13/2014 Elsevier Interactive Patient Education  2017 [employer].

## 2020-08-05 NOTE — Progress Notes (Signed)
Virtual Visit via Telephone Note  I connected with  [name] on 08/05/20 at 10:15 AM EST by telephone and verified that I am speaking with the correct person using two identifiers.  Medicare Annual Wellness visit completed telephonically due to Covid-19 pandemic.   Persons participating in this call: This Health Coach and this patient.   Location: Patient: Home Provider: Office   I discussed the limitations, risks, security and privacy concerns of performing an evaluation and management service by telephone and the availability of in person appointments. The patient expressed understanding and agreed to proceed.  Unable to perform video visit due to video visit attempted and failed and/or patient does not have video capability.   Some vital signs may be absent or patient reported.   Willette Brace, LPN     Subjective:   [facility] is a 70 y.o. female who presents for Medicare Annual (Subsequent) preventive examination.  Review of Systems     Cardiac Risk Factors include: advanced age (>62men, >91 women)     Objective:    There were no vitals filed for this visit. There is no height or weight on file to calculate BMI.  Advanced Directives 08/05/2020 06/07/2020 11/01/2019 12/25/2018 11/29/2018 10/22/2018 10/21/2018  Does Patient Have a Medical Advance Directive? No No No No No No No  Type of Advance Directive - - - - - - -  Would patient like information on creating a medical advance directive? Yes (MAU/Ambulatory/Procedural Areas - Information given) No - Guardian declined - - - No - Guardian declined No - Guardian declined    Current Medications (verified) Outpatient Encounter Medications as of 08/05/2020  Medication Sig  . alclomethasone (ACLOVATE) 0.05 % cream Apply Under breast and abdomen daily  . Cholecalciferol (VITAMIN D) 50 MCG (2000 UT) tablet Take 2,000 Units by mouth daily.   . clobetasol (TEMOVATE) 0.05 % external solution Apply 1 application topically 2 (two)  times daily. Scalp  . fluconazole (DIFLUCAN) 200 MG tablet TAKE 1 TABLET BY MOUTH EVERY 3 DAYS  . fluocinonide-emollient (LIDEX-E) 0.05 % cream Apply on body daily not on face or folds  . gabapentin (NEURONTIN) 300 MG capsule Take 2 capsules (600 mg total) by mouth 3 (three) times daily.  . hyoscyamine (LEVSIN) 0.125 MG tablet TAKE 1 TABLET (0.125 MG TOTAL) BY MOUTH EVERY 4 (FOUR) HOURS AS NEEDED.  [name] levothyroxine (SYNTHROID) 137 MCG tablet TAKE 1 TABLET (137 MCG TOTAL) BY MOUTH DAILY BEFORE BREAKFAST.  [name] lisinopril (ZESTRIL) 5 MG tablet Take 1 tablet (5 mg total) by mouth daily.  . meloxicam (MOBIC) 7.5 MG tablet TAKE 1 TABLET BY MOUTH DAILY AS NEEDED FOR PAIN  . metFORMIN (GLUCOPHAGE) 500 MG tablet TAKE 1 TABLET (500 MG TOTAL) BY MOUTH 2 (TWO) TIMES DAILY WITH A MEAL.  [name] omeprazole (PRILOSEC) 40 MG capsule TAKE 1 CAPSULE BY MOUTH EVERY DAY  . oxybutynin (DITROPAN-XL) 5 MG 24 hr tablet Take 5 mg by mouth at bedtime.  . rosuvastatin (CRESTOR) 40 MG tablet Take 1 tablet (40 mg total) by mouth daily.  [name] senna-docusate (SENOKOT-S) 8.6-50 MG tablet Take 2 tablets by mouth every evening.   . sertraline (ZOLOFT) 100 MG tablet Take 1 tablet (100 mg total) by mouth daily.  [name] sulfamethoxazole-trimethoprim (BACTRIM DS) 800-160 MG tablet Take by mouth.  . tamsulosin (FLOMAX) 0.4 MG CAPS capsule Take by mouth.  . zolpidem (AMBIEN) 10 MG tablet Take 1 tablet (10 mg total) by mouth at bedtime as needed for sleep.  TAKE 1 TABLET BY MOUTH EVERY DAY AT BEDTIME AS NEEDED FOR SLEEP  . HYDROcodone-acetaminophen (NORCO) 10-325 MG tablet Take 1 tablet by mouth every 6 (six) hours as needed for severe pain. (Patient not taking: Reported on 08/05/2020)  . ondansetron (ZOFRAN) 4 MG tablet Take 4 mg by mouth every 8 (eight) hours as needed for nausea or vomiting. (Patient not taking: Reported on 08/05/2020)  . [DISCONTINUED] pravastatin (PRAVACHOL) 40 MG tablet Take 1 tablet (40 mg total) by mouth daily.   No  facility-administered encounter medications on file as of 08/05/2020.    Allergies (verified) Ketorolac   History: Past Medical History:  Diagnosis Date  . Allergic rhinitis   . Allergy    seasonal allergies  . Barrett's esophagus   . Bladder spasms   . Chronic back pain   . DDD (degenerative disc disease), lumbar    on meds  . Degenerative scoliosis   . Depression    on meds  . Diabetes mellitus without complication ([place])    on meds  . Diabetic peripheral neuropathy ([place])   . Fibromyalgia   . Full dentures   . GAD (generalized anxiety disorder)    on meds  . GERD (gastroesophageal reflux disease)    on meds  . History of bladder stone   . History of colon polyps   . History of kidney stones   . History of migraine   . History of recurrent UTIs   . History of sepsis    10-18-2018  bacterium-ecoli blood culture  . Hydronephrosis   . Hydronephrosis, bilateral    due to DM  . Hyperlipidemia    on meds  . Hypertension    on meds  . Hypothyroidism 11/02/2013 dx   followed by pcp- on meds  . IBS (irritable bowel syndrome)   . Insomnia   . Osteopenia   . Osteopenia after menopause 06/06/2019   dexa 2019 T = -1.9, multiple site osteopenia; recheck 2021  . Peripheral neuropathy   . Psoriasis   . Restless leg syndrome   . Seborrheic dermatitis of scalp   . Spondylolysis of lumbar region   . Wears glasses    Past Surgical History:  Procedure Laterality Date  . ABDOMINAL HYSTERECTOMY  1987  . BALLOON DILATION  07/10/2011   Procedure: BALLOON DILATION;  Surgeon: Fredricka Bonine, MD;  Location: [facility];  Service: Urology;  Laterality: Right;  . CESAREAN SECTION  X2  . CHOLECYSTECTOMY    . COLONOSCOPY  2018   at [facility] (results not availabe at this time)  . CYSTO/ BILATERAL URETEROSCOPY / URETERAL BX'S/ BILATERAL URETERAL STENT PLACEMENT/ BLADDER STONE EXTRACTION  03-13-2011  . CYSTOSCOPY W/ RETROGRADES  07/10/2011   Procedure: CYSTOSCOPY  WITH RETROGRADE PYELOGRAM;  Surgeon: Fredricka Bonine, MD;  Location: [facility];  Service: Urology;  Laterality: Right;  . CYSTOSCOPY W/ URETERAL STENT PLACEMENT  07/10/2011   Procedure: CYSTOSCOPY WITH STENT REPLACEMENT;  Surgeon: Fredricka Bonine, MD;  Location: [facility];  Service: Urology;  Laterality: Right;  . CYSTOSCOPY W/ URETERAL STENT PLACEMENT Bilateral 10/22/2018   Procedure: CYSTOSCOPY WITH RETROGRADE PYELOGRAM/URETERAL STENT PLACEMENT-BILATERAL;  Surgeon: Ardis Hughs, MD;  Location: [place];  Service: Urology;  Laterality: Bilateral;  . CYSTOSCOPY W/ URETERAL STENT REMOVAL  07/10/2011   Procedure: CYSTOSCOPY WITH STENT REMOVAL;  Surgeon: Fredricka Bonine, MD;  Location: [facility];  Service: Urology;  Laterality: Right;  . CYSTOSCOPY WITH URETEROSCOPY AND  STENT PLACEMENT Bilateral 11/29/2018   Procedure: CYSTOSCOPY WITH BILATERAL URETEROSCOPY AND STENT EXCHANGE/ WITH LEFT BRUSH BIOPSY;  Surgeon: Festus Aloe, MD;  Location: [facility];  Service: Urology;  Laterality: Bilateral;  . CYSTOSCOPY/RETROGRADE/URETEROSCOPY Left 11/14/2019   Procedure: CYSTOSCOPY/RETROGRADE/URETEROSCOPY/ BIOPSY/ STENT PLACEMENT;  Surgeon: Festus Aloe, MD;  Location: [facility];  Service: Urology;  Laterality: Left;  . LUMBAR MICRODISCECTOMY  1990'S   L5 - S1  . RIGHT FOOT SURG  2003   [facility]  . RIGHT URETEROSCOPIC / URETERAL BX/ STENT PLACEMENT  04-17-2011  . UPPER GASTROINTESTINAL ENDOSCOPY  2018   @ [facility] -results not available at this time  . UPPER GI ENDOSCOPY     Family History  Problem Relation Age of Onset  . COPD Mother   . Emphysema Mother   . Osteoarthritis Mother   . Rheum arthritis Mother   . Arthritis Mother   . Hypertension Mother   . Prostate cancer Father        w/mets  . Arthritis Father   . Birth defects Father   . Cancer Father   . Hypertension Father   . Arthritis Daughter   . Asthma  Daughter   . Birth defects Daughter   . Depression Daughter   . Hearing loss Daughter   . Mental retardation Daughter   . Kidney Stones Daughter   . Arthritis Brother   . Hyperlipidemia Brother   . Coronary artery disease Paternal Grandfather   . Diabetes Paternal Aunt        x 3  . Arthritis Maternal Grandmother   . Colon cancer Neg Hx   . Colon polyps Neg Hx   . Esophageal cancer Neg Hx   . Stomach cancer Neg Hx   . Rectal cancer Neg Hx    Social History   Socioeconomic History  . Marital status: Married    Spouse name: Not on file  . Number of children: 2  . Years of education: 54  . Highest education level: Not on file  Occupational History  . Occupation: LPN    Employer: [employer]  Tobacco Use  . Smoking status: Never Smoker  . Smokeless tobacco: Never Used  Vaping Use  . Vaping Use: Never used  Substance and Sexual Activity  . Alcohol use: Yes    Alcohol/week: 1.0 standard drink    Types: 1 Standard drinks or equivalent per week    Comment: rarely  . Drug use: No  . Sexual activity: Yes    Partners: Male  Other Topics Concern  . 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 1 daughter - '83 trisomy 47 defect; 1 son - '87, offers a road-side service; 1 grandson; 1 step -son.  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   Social Determinants of Health   Financial Resource Strain: Low Risk   . Difficulty of Paying Living Expenses: Not hard at all  Food Insecurity: No Food Insecurity  . Worried About Charity fundraiser in the Last Year: Never true  . Ran Out of Food in the Last Year: Never true  Transportation Needs: No Transportation Needs  . Lack of Transportation (Medical): No  . Lack of Transportation (Non-Medical): No  Physical Activity: Insufficiently Active  . Days of Exercise per Week: 3 days  . Minutes of Exercise per Session: 20 min  Stress: Stress Concern  Present  . Feeling  of Stress : To some extent  Social Connections: Moderately Isolated  . Frequency of Communication with Friends and Family: Once a week  . Frequency of Social Gatherings with Friends and Family: Once a week  . Attends Religious Services: More than 4 times per year  . Active Member of Clubs or Organizations: No  . Attends Archivist Meetings: Never  . Marital Status: Married    Tobacco Counseling Counseling given: Not Answered   Clinical Intake:  Pre-visit preparation completed: Yes  Pain : 0-10 Pain Type: Chronic pain Pain Location: Groin (left groin to bladder) Pain Orientation: Left Pain Descriptors / Indicators: Sharp Pain Frequency: Constant     BMI - recorded: 38.19 Nutritional Status: BMI > 30  Obese Nutritional Risks: Nausea/ vomitting/ diarrhea (nausea) Diabetes: Yes CBG done?: No Did pt. bring in CBG monitor from home?: No  How often do you need to have someone help you when you read instructions, pamphlets, or other written materials from your doctor or pharmacy?: 1 - Never  Diabetic?Nutrition Risk Assessment:  Has the patient had any N/V/D within the last 2 months?  Yes  Does the patient have any non-healing wounds?  No  Has the patient had any unintentional weight loss or weight gain?  No   Diabetes:  Is the patient diabetic?  Yes  If diabetic, was a CBG obtained today?  No  Did the patient bring in their glucometer from home?  No  How often do you monitor your CBG's? N/a.   Financial Strains and Diabetes Management:  Are you having any financial strains with the device, your supplies or your medication? No .  Does the patient want to be seen by Chronic Care Management for management of their diabetes?  No  Would the patient like to be referred to a Nutritionist or for Diabetic Management?  No   Diabetic Exams:  Diabetic Eye Exam: Overdue for diabetic eye exam. Pt has been advised about the importance in completing this  exam. Patient advised to call and schedule an eye exam. Diabetic Foot Exam: Completed 04/04/20   Interpreter Needed?: No  Information entered by :: Charlott Rakes, LPN   Activities of Daily Living In your present state of health, do you have any difficulty performing the following activities: 08/05/2020 11/01/2019  Hearing? Y N  Comment mild loss -  Vision? N N  Difficulty concentrating or making decisions? N N  Walking or climbing stairs? Y Y  Comment knees knee pain  Dressing or bathing? N N  Doing errands, shopping? N N  Preparing Food and eating ? N -  Using the Toilet? N -  In the past six months, have you accidently leaked urine? Y -  Comment leaking -  Do you have problems with loss of bowel control? N -  Managing your Medications? N -  Managing your Finances? N -  Housekeeping or managing your Housekeeping? N -  Some recent data might be hidden    Patient Care Team: Leamon Arnt, MD as PCP - General (Family Medicine) Festus Aloe, MD (Urology) Gatha Mayer, MD (Gastroenterology) Curt Jews, [place] (Optometry) Melina Schools, MD as Consulting Physician (Orthopedic Surgery) Warren Danes, PA-C as Physician Assistant (Dermatology)  Indicate any recent Medical Services you may have received from other than [facility] providers in the past year (date may be approximate).     Assessment:   This is a routine wellness examination for [facility].  Hearing/Vision screen  Hearing Screening  125Hz  250Hz  500Hz  1000Hz  2000Hz  3000Hz  4000Hz  6000Hz  8000Hz   Right ear:           Left ear:           Comments: Pt states mild loss  Vision Screening Comments: Pt follows [facility] with [facility] for annual exams  Dietary issues and exercise activities discussed: Current Exercise Habits: Home exercise routine, Type of exercise: walking, Time (Minutes): 20, Frequency (Times/Week): 3, Weekly Exercise (Minutes/Week): 60  Goals    . Patient Stated     Maintain health       Depression Screen PHQ 2/9 Scores 08/05/2020 04/08/2020 03/26/2020 12/02/2017 12/02/2017 10/06/2013  PHQ - 2 Score 1 5 4 2  0 0  PHQ- 9 Score - 10 10 6  - -    Fall Risk Fall Risk  08/05/2020 09/16/2018 12/02/2017 12/02/2017 12/02/2017  Falls in the past year? 0 0 No No No  Number falls in past yr: 0 0 - - -  Injury with Fall? 0 0 - - -  Risk for fall due to : Impaired vision;Impaired balance/gait;Impaired mobility - - - -  Follow up Falls prevention discussed Falls evaluation completed - - -    FALL RISK PREVENTION PERTAINING TO THE HOME:  Any stairs in or around the home? Yes  If so, are there any without handrails? No  Home free of loose throw rugs in walkways, pet beds, electrical cords, etc? Yes  Adequate lighting in your home to reduce risk of falls? Yes   ASSISTIVE DEVICES UTILIZED TO PREVENT FALLS:  Life alert? No  Use of a cane, walker or w/c? No  Grab bars in the bathroom? Yes  Shower chair or bench in shower? No  Elevated toilet seat or a handicapped toilet? Yes   TIMED UP AND GO:  Was the test performed? No     Cognitive Function: Declined 6cit         Immunizations Immunization History  Administered Date(s) Administered  . Fluad Quad(high Dose 65+) 04/13/2019, 04/04/2020  . Influenza Whole 03/22/2001, 03/22/2006, 03/22/2010  . Influenza, High Dose Seasonal PF 02/19/2016, 06/02/2018  . Influenza, Seasonal, Injecte, Preservative Fre 06/27/2012  . Influenza-Unspecified 04/13/2019  . Moderna Sars-Covid-2 Vaccination 08/14/2019  . PFIZER(Purple Top)SARS-COV-2 Vaccination 07/24/2019, 05/08/2020  . Pneumococcal Conjugate-13 11/27/2015  . Pneumococcal Polysaccharide-23 11/13/2010  . Tdap 11/13/2010  . Zoster 11/27/2015  . Zoster Recombinat (Shingrix) 04/13/2019    TDAP status: Due, Education has been provided regarding the importance of this vaccine. Advised may receive this vaccine at local pharmacy or Health Dept. Aware to provide a copy of the vaccination  record if obtained from local pharmacy or Health Dept. Verbalized acceptance and understanding.  Flu Vaccine status: Up to date  04/04/20 Pneumococcal vaccine status: Up to date  Covid-19 vaccine status: Completed vaccines  Qualifies for Shingles Vaccine? Yes   Zostavax completed Yes   Shingrix Completed?: Yes  Screening Tests Health Maintenance  Topic Date Due  . OPHTHALMOLOGY EXAM  07/12/2020  . MAMMOGRAM  08/05/2021 (Originally 12/22/2016)  . DEXA SCAN  08/05/2021 (Originally 06/04/2020)  . COVID-19 Vaccine (4 - Booster) 11/05/2020  . TETANUS/TDAP  11/12/2020  . HEMOGLOBIN A1C  01/08/2021  . FOOT EXAM  04/04/2021  . COLONOSCOPY (Pts 45-60yrs Insurance coverage will need to be confirmed)  05/02/2023  . INFLUENZA VACCINE  Completed  . Hepatitis C Screening  Completed  . PNA vac Low Risk Adult  Completed    Health Maintenance  Health Maintenance Due  Topic Date  Due  . OPHTHALMOLOGY EXAM  07/12/2020    Colorectal cancer screening: Type of screening: Colonoscopy. Completed 05/01/20. Repeat every 3 years  Mammogram status: No longer required due to per pt .  Declined: Bone density   Additional Screening:  Hepatitis C Screening:  Completed 01/09/15  Vision Screening: Recommended annual ophthalmology exams for early detection of glaucoma and other disorders of the eye. Is the patient up to date with their annual eye exam?  Yes  Who is the provider or what is the name of the office in which the patient attends annual eye exams? [facility] If pt is not established with a provider, would they like to be referred to a provider to establish care? No.   Dental Screening: Recommended annual dental exams for proper oral hygiene  Community Resource Referral / Chronic Care Management: CRR required this visit?  No   CCM required this visit?  No      Plan:     I have personally reviewed and noted the following in the patient's chart:   . Medical and social history . Use of  alcohol, tobacco or illicit drugs  . Current medications and supplements . Functional ability and status . Nutritional status . Physical activity . Advanced directives . List of other physicians . Hospitalizations, surgeries, and ER visits in previous 12 months . Vitals . Screenings to include cognitive, depression, and falls . Referrals and appointments  In addition, I have reviewed and discussed with patient certain preventive protocols, quality metrics, and best practice recommendations. A written personalized care plan for preventive services as well as general preventive health recommendations were provided to patient.     Willette Brace, LPN   01/29/9832   Nurse Notes: None

## 2020-08-06 ENCOUNTER — Other Ambulatory Visit: Payer: Self-pay | Admitting: Family Medicine

## 2020-08-08 ENCOUNTER — Other Ambulatory Visit: Payer: Medicare Other

## 2020-08-08 DIAGNOSIS — Z20822 Contact with and (suspected) exposure to covid-19: Secondary | ICD-10-CM | POA: Diagnosis not present

## 2020-08-08 NOTE — Telephone Encounter (Signed)
Last refill: 06/12/20 #30,5  Last OV: 07/11/20 dx. DM, bladder spasms, depression

## 2020-08-09 LAB — NOVEL CORONAVIRUS, NAA: SARS-CoV-2, NAA: DETECTED — AB

## 2020-08-09 LAB — SARS-COV-2, NAA 2 DAY TAT

## 2020-08-10 ENCOUNTER — Telehealth: Payer: Self-pay | Admitting: Physician Assistant

## 2020-08-10 NOTE — Telephone Encounter (Signed)
Called to discuss with patient about COVID-19 symptoms and the use of one of the available treatments for those with mild to moderate Covid symptoms and at a high risk of hospitalization.  Pt appears to qualify for outpatient treatment due to co-morbid conditions and/or a member of an at-risk group in accordance with the FDA Emergency Use Authorization.    Symptom onset: sx day 9 per mychart referral, need to clarify Vaccinated: yes Booster? Yes Immunocompromised? no Qualifiers: age, BMI, CVD, DMT2  Unable to reach pt - VM not set up. Sent mychart message  Angelena Form

## 2020-08-10 NOTE — Telephone Encounter (Signed)
Called to discuss with patient about COVID-19 symptoms and the use of one of the available treatments for those with mild to moderate Covid symptoms and at a high risk of hospitalization.  Pt appears to qualify for outpatient treatment due to co-morbid conditions and/or a member of an at-risk group in accordance with the FDA Emergency Use Authorization.    Symptom onset: 2/12  Patient is feeling better and not interested in infusion.   [employer]

## 2020-08-14 ENCOUNTER — Telehealth: Payer: Self-pay

## 2020-08-14 NOTE — Telephone Encounter (Signed)
Patient aware of PCP recommendations listed below 

## 2020-08-14 NOTE — Telephone Encounter (Signed)
Pt tested positive for covid over 10 days ago. She isolated for 5 days, then wore her mask for 5 days. Pt wants to know when it would be okay to go around her new grandchild.

## 2020-08-14 NOTE — Telephone Encounter (Signed)
Please advise 

## 2020-08-14 NOTE — Telephone Encounter (Signed)
Technically ok to be around others now. Would always wear a mask around a newborn.

## 2020-08-20 DIAGNOSIS — R9389 Abnormal findings on diagnostic imaging of other specified body structures: Secondary | ICD-10-CM | POA: Diagnosis not present

## 2020-08-20 DIAGNOSIS — R8290 Unspecified abnormal findings in urine: Secondary | ICD-10-CM | POA: Diagnosis not present

## 2020-08-20 DIAGNOSIS — Z87448 Personal history of other diseases of urinary system: Secondary | ICD-10-CM | POA: Diagnosis not present

## 2020-08-20 DIAGNOSIS — Z466 Encounter for fitting and adjustment of urinary device: Secondary | ICD-10-CM | POA: Diagnosis not present

## 2020-08-20 DIAGNOSIS — N135 Crossing vessel and stricture of ureter without hydronephrosis: Secondary | ICD-10-CM | POA: Diagnosis not present

## 2020-08-27 ENCOUNTER — Encounter: Payer: Self-pay | Admitting: Physician Assistant

## 2020-08-27 NOTE — Progress Notes (Signed)
   Follow-Up Visit   Subjective  [facility] is a 70 y.o. female who presents for the following: Psoriasis (Completely broken out- scalp, neck & back...spots all over tx - clobetasol cream, alclometasone cream & fluocinonide cream).   The following portions of the chart were reviewed this encounter and updated as appropriate:      Objective  Well appearing patient in no apparent distress; mood and affect are within normal limits.  A full examination was performed including scalp, head, eyes, ears, nose, lips, neck, chest, axillae, abdomen, back, buttocks, bilateral upper extremities, bilateral lower extremities, hands, feet, fingers, toes, fingernails, and toenails. All findings within normal limits unless otherwise noted below.  Objective  Mid Back, Neck - Anterior, Right Shoulder - Anterior, Scalp: Well-marginated erythematous plaques with silvery scale. Significant flare. No signs of PsA. No tenosynovitic or dactylitis.  Images     Assessment & Plan  Psoriasis (4) Right Shoulder - Anterior; Neck - Anterior; Mid Back; Scalp  Discussed intradermal injections if topicals fail  fluocinonide-emollient (LIDEX-E) 0.05 % cream - Mid Back, Neck - Anterior, Right Shoulder - Anterior, Scalp  clobetasol (TEMOVATE) 0.05 % external solution - Mid Back, Neck - Anterior, Right Shoulder - Anterior, Scalp  alclomethasone (ACLOVATE) 0.05 % cream - Mid Back, Neck - Anterior, Right Shoulder - Anterior, Scalp    I, Sharley Keeler, PA-C, have reviewed all documentation's for this visit.  The documentation on 08/27/20 for the exam, diagnosis, procedures and orders are all accurate and complete.

## 2020-09-26 ENCOUNTER — Other Ambulatory Visit: Payer: Self-pay

## 2020-09-26 ENCOUNTER — Encounter: Payer: Self-pay | Admitting: Physician Assistant

## 2020-09-26 ENCOUNTER — Ambulatory Visit (INDEPENDENT_AMBULATORY_CARE_PROVIDER_SITE_OTHER): Payer: Medicare Other | Admitting: Physician Assistant

## 2020-09-26 DIAGNOSIS — L409 Psoriasis, unspecified: Secondary | ICD-10-CM | POA: Diagnosis not present

## 2020-09-26 MED ORDER — TRIAMCINOLONE ACETONIDE 10 MG/ML IJ SUSP
10.0000 mg | Freq: Once | INTRAMUSCULAR | Status: AC
  Administered 2020-09-26: 10 mg

## 2020-09-26 NOTE — Progress Notes (Signed)
   Follow-Up Visit   Subjective  [facility]ie R [facility] is a 70 y.o. female who presents for the following: Psoriasis (Patients is flared, per patient it is spreading to the back x week, "spot all over" scalp is getting worse wants shampoo, clobetasol per patient kind of works. Per patient the alclometasone is still working decreasing inflammation under the breasts.   The following portions of the chart were reviewed this encounter and updated as appropriate:  Tobacco  Allergies  Meds  Problems  Med Hx  Surg Hx  Fam Hx      Objective  Well appearing patient in no apparent distress; mood and affect are within normal limits.  All skin waist up examined.  Objective  Left Inframammary Fold, Mid Back, Right Inframammary Fold, Scalp: Thick plaque with silver scale.   Assessment & Plan  Psoriasis (4) Left Inframammary Fold; Right Inframammary Fold; Mid Back; Scalp  Intralesional injection - Left Inframammary Fold, Mid Back, Right Inframammary Fold, Scalp Location: posterior scalp  Informed Consent: Discussed risks (infection, pain, bleeding, bruising, thinning of the skin, loss of skin pigment, lack of resolution, and recurrence of lesion) and benefits of the procedure, as well as the alternatives. Informed consent was obtained. Preparation: The area was prepared a standard fashion.  Anesthesia:none  Procedure Details: An intralesional injection was performed with Kenalog 10 mg/cc. 3 cc in total were injected.  Total number of injections: 16  Plan: The patient was instructed on post-op care. Recommend OTC analgesia as needed for pain.   Other Related Medications fluocinonide-emollient (LIDEX-E) 0.05 % cream clobetasol (TEMOVATE) 0.05 % external solution alclomethasone (ACLOVATE) 0.05 % cream    I, Jalaine Riggenbach, PA-C, have reviewed all documentation's for this visit.  The documentation on 09/26/20 for the exam, diagnosis, procedures and orders are all accurate and complete.

## 2020-10-09 ENCOUNTER — Ambulatory Visit: Payer: Medicare Other | Admitting: Family Medicine

## 2020-10-10 ENCOUNTER — Telehealth: Payer: Self-pay

## 2020-10-10 NOTE — Telephone Encounter (Signed)
  LAST APPOINTMENT DATE: 08/14/2020   NEXT APPOINTMENT DATE:@4 /27/2022  MEDICATION:zolpidem (AMBIEN) 10 MG tablet  PHARMACY:[id number] - [place], [place] - [name]  Comments: Patient states that the pharmacy is requiring a prior auth.

## 2020-10-11 ENCOUNTER — Other Ambulatory Visit: Payer: Self-pay

## 2020-10-11 MED ORDER — ZOLPIDEM TARTRATE 10 MG PO TABS: 10.0000 mg | ORAL_TABLET | Freq: Every evening | ORAL | 0 refills | Status: DC | PRN

## 2020-10-11 NOTE — Telephone Encounter (Signed)
PA approved, request sent to PCP

## 2020-10-11 NOTE — Telephone Encounter (Signed)
PA submitted via CoverMyMeds.

## 2020-10-11 NOTE — Telephone Encounter (Signed)
Last refill: 06/12/20 #30, 5 Last OV: 07/11/20 dx. DM

## 2020-10-16 ENCOUNTER — Ambulatory Visit (INDEPENDENT_AMBULATORY_CARE_PROVIDER_SITE_OTHER): Payer: Medicare Other | Admitting: Family Medicine

## 2020-10-16 ENCOUNTER — Encounter: Payer: Self-pay | Admitting: Family Medicine

## 2020-10-16 ENCOUNTER — Other Ambulatory Visit: Payer: Self-pay

## 2020-10-16 VITALS — BP 120/74 | HR 74 | Temp 97.7°F | Wt 217.0 lb

## 2020-10-16 DIAGNOSIS — F321 Major depressive disorder, single episode, moderate: Secondary | ICD-10-CM

## 2020-10-16 DIAGNOSIS — E782 Mixed hyperlipidemia: Secondary | ICD-10-CM

## 2020-10-16 DIAGNOSIS — M797 Fibromyalgia: Secondary | ICD-10-CM

## 2020-10-16 DIAGNOSIS — R1030 Lower abdominal pain, unspecified: Secondary | ICD-10-CM | POA: Diagnosis not present

## 2020-10-16 DIAGNOSIS — E039 Hypothyroidism, unspecified: Secondary | ICD-10-CM | POA: Diagnosis not present

## 2020-10-16 DIAGNOSIS — E1142 Type 2 diabetes mellitus with diabetic polyneuropathy: Secondary | ICD-10-CM | POA: Diagnosis not present

## 2020-10-16 LAB — LIPID PANEL
Cholesterol: 123 mg/dL (ref 0–200)
HDL: 41.6 mg/dL (ref >39.00)
LDL Cholesterol: 66 mg/dL (ref 0–99)
NonHDL: 81.12
Total CHOL/HDL Ratio: 3
Triglycerides: 75 mg/dL (ref 0.0–149.0)
VLDL: 15 mg/dL (ref 0.0–40.0)

## 2020-10-16 LAB — COMPREHENSIVE METABOLIC PANEL
ALT: 31 U/L (ref 0–35)
AST: 36 U/L (ref 0–37)
Albumin: 4.1 g/dL (ref 3.5–5.2)
Alkaline Phosphatase: 63 U/L (ref 39–117)
BUN: 25 mg/dL — ABNORMAL HIGH (ref 6–23)
CO2: 23 mEq/L (ref 19–32)
Calcium: 9.5 mg/dL (ref 8.4–10.5)
Chloride: 103 mEq/L (ref 96–112)
Creatinine, Ser: 1.2 mg/dL (ref 0.40–1.20)
GFR: 45.9 mL/min — ABNORMAL LOW (ref >60.00)
Glucose, Bld: 118 mg/dL — ABNORMAL HIGH (ref 70–99)
Potassium: 4.6 mEq/L (ref 3.5–5.1)
Sodium: 140 mEq/L (ref 135–145)
Total Bilirubin: 0.4 mg/dL (ref 0.2–1.2)
Total Protein: 7.1 g/dL (ref 6.0–8.3)

## 2020-10-16 LAB — TSH: TSH: 12.6 u[IU]/mL — ABNORMAL HIGH (ref 0.35–4.50)

## 2020-10-16 LAB — POCT GLYCOSYLATED HEMOGLOBIN (HGB A1C): Hemoglobin A1C: 6.8 % — AB (ref 4.0–5.6)

## 2020-10-16 NOTE — Patient Instructions (Signed)
Please return in 6 months for diabetes and blood pressure recheck.   I will release your lab results to you on your MyChart account with further instructions. Please reply with any questions.   If you have any questions or concerns, please don't hesitate to send me a message via MyChart or call the office at [phone]. Thank you for visiting with [name] today! It's our pleasure caring for you.  Please set up an appointment for a diabetic eye exam and have the results sent to me.

## 2020-10-16 NOTE — Progress Notes (Signed)
Subjective  CC:  Chief Complaint  Patient presents with  . Diabetes  . Hyperlipidemia  . Hypertension  . Hypothyroidism  . leg cramps    Mostly at night time    HPI: Miranda Mathis is a 70 y.o. female who presents to the office today for follow up of diabetes and problems listed above in the chief complaint.   Diabetes follow up: Her diabetic control is reported as Unchanged.  Tolerating metformin 500 twice daily.  Diet is fair. She denies exertional CP or SOB or symptomatic hypoglycemia. She denies foot sores or paresthesias.   Hyperlipidemia: Goal LDL less than 70.  Recently increased Crestor to 40 mg nightly.  She is tolerating this dose.  Due for recheck.  Hypothyroidism: Most recent TSH was above goal.  Adjusted how she is taking her medicine.  No longer taking with vitamin D or PPI.  She takes daily.  Due for recheck.  No symptoms of high or low thyroid.  Hypertension remains well controlled without symptoms of low blood pressure.  She is on an ACE inhibitor.  Left lower quadrant abdominal pain: She had her ureteral stent removed in early March and all pain has since been resolved.  It seems that the stent was the source of her pain.  She is very happy about this.  Fibromyalgia: Much better now that her lower abdominal pain has improved.  We did increase the dose of her gabapentin at the last visit and she feels this has been helpful.  Depression follow-up: We increased sertraline from 50 mg to 100 mg 3 months ago.  She definitely feels this is improved her mood.  No level depressive symptoms.  No adverse effects.   Wt Readings from Last 3 Encounters:  10/16/20 217 lb (98.4 kg)  07/11/20 208 lb 12.8 oz (94.7 kg)  06/07/20 215 lb (97.5 kg)    BP Readings from Last 3 Encounters:  10/16/20 120/74  07/11/20 116/72  06/08/20 125/60    Assessment  1. DM type 2 with diabetic peripheral neuropathy ([place])   2. Depression, major, single episode, moderate ([place])   3.  Fibromyalgia   4. Acquired hypothyroidism   5. Mixed hyperlipidemia   6. Lower abdominal pain      Plan   Diabetes is currently well controlled.  Continue metformin 500 twice daily and diabetic diet.  Patient needs to schedule her eye exam.  Depression: Much improved.  Continue sertraline 100 mg daily.  Fibromyalgia has improved continue gabapentin  Recheck hypothyroidism now that she is taking her medication correctly.  Adjust dose if needed.  Hyperlipidemia not yet at goal.  Recheck today on 40 mg Crestor.  Should be at goal, LDL less than 70.  Lower abdominal pain: Due to ureteral stent.  Now completely resolved.  Health maintenance: She defers mammogram and bone density at this time.  Follow up: 6 months to recheck blood pressure and diabetes, fibromyalgia and depression. Orders Placed This Encounter  Procedures  . Comprehensive metabolic panel  . TSH  . Lipid panel  . POCT HgB A1C   No orders of the defined types were placed in this encounter.     Immunization History  Administered Date(s) Administered  . Fluad Quad(high Dose 65+) 04/13/2019, 04/04/2020  . Influenza Whole 03/22/2001, 03/22/2006, 03/22/2010  . Influenza, High Dose Seasonal PF 02/19/2016, 06/02/2018  . Influenza, Seasonal, Injecte, Preservative Fre 06/27/2012  . Influenza-Unspecified 04/13/2019  . Moderna Sars-Covid-2 Vaccination 08/14/2019  . PFIZER(Purple Top)SARS-COV-2 Vaccination 07/17/2019, 07/24/2019,  05/08/2020  . Pneumococcal Conjugate-13 11/27/2015  . Pneumococcal Polysaccharide-23 11/13/2010  . Tdap 11/13/2010, 09/14/2020  . Zoster 11/27/2015  . Zoster Recombinat (Shingrix) 04/13/2019    Diabetes Related Lab Review: Lab Results  Component Value Date   HGBA1C 6.8 (A) 10/16/2020   HGBA1C 6.7 (A) 07/11/2020   HGBA1C 6.9 (A) 12/20/2019    Lab Results  Component Value Date   MICROALBUR 2.9 (H) 06/06/2019   Lab Results  Component Value Date   CREATININE 1.14 07/11/2020   BUN 14  07/11/2020   NA 141 07/11/2020   K 4.3 07/11/2020   CL 105 07/11/2020   CO2 31 07/11/2020   Lab Results  Component Value Date   CHOL 143 07/11/2020   CHOL 135 12/20/2019   CHOL 183 06/06/2019   Lab Results  Component Value Date   HDL 34.80 (L) 07/11/2020   HDL 33.10 (L) 12/20/2019   HDL 36.50 (L) 06/06/2019   Lab Results  Component Value Date   LDLCALC 86 07/11/2020   LDLCALC 80 12/20/2019   LDLCALC 123 (H) 06/06/2019   Lab Results  Component Value Date   TRIG 108.0 07/11/2020   TRIG 108.0 12/20/2019   TRIG 116.0 06/06/2019   Lab Results  Component Value Date   CHOLHDL 4 07/11/2020   CHOLHDL 4 12/20/2019   CHOLHDL 5 06/06/2019   Lab Results  Component Value Date   LDLDIRECT 165.3 11/13/2010   LDLDIRECT 143.5 08/30/2008   The 10-year ASCVD risk score Mikey Bussing DC Jr., et al., 2013) is: 20.2%   Values used to calculate the score:     Age: 41 years     Sex: Female     Is Non-Hispanic African American: No     Diabetic: Yes     Tobacco smoker: No     Systolic Blood Pressure: 235 mmHg     Is BP treated: Yes     HDL Cholesterol: 34.8 mg/dL     Total Cholesterol: 143 mg/dL I have reviewed the PMH, Fam and Soc history. Patient Active Problem List   Diagnosis Date Noted  . DM type 2 with diabetic peripheral neuropathy ([place]) 09/02/2016    Priority: High    Diagnosed 2019; started meds 2020; + microalbunuria on ace   . Recurrent kidney stones 08/29/2015    Priority: High    Overview:  S/p stents, multiple, [facility], Dr. Linton Ham   . GAD (generalized anxiety disorder) 08/29/2015    Priority: High  . Acquired hypothyroidism 11/02/2013    Priority: High  . Mixed hyperlipidemia 04/04/2012    Priority: High  . Fibromyalgia 10/11/2007    Priority: High  . Chronic low back pain 10/11/2007    Priority: High  . Osteopenia after menopause 06/06/2019    Priority: Medium    dexa 2019 T = -1.9, multiple site osteopenia; recheck 2021   . Bilateral hydronephrosis      Priority: Medium  . Degenerative scoliosis 09/07/2018    Priority: Medium  . Psoriasis 12/02/2017    Priority: Medium  . History of Barrett's esophagus 08/29/2015    Priority: Medium    Last EGD 2018, no evidence of Barrett's esophagus, no further EGD recommended   . GERD (gastroesophageal reflux disease) 08/29/2015    Priority: Medium  . Irritable bowel syndrome with constipation 08/29/2015    Priority: Medium  . Lumbosacral spondylosis with radiculopathy 08/29/2015    Priority: Medium  . Primary insomnia 10/25/2014    Priority: Medium    Overview:  Last Assessment &  Plan:  Insomnia is stable with current regimen of Ambien. Continue current dosage of Ambien.   . Seborrheic dermatitis of scalp 12/02/2017    Priority: Low  . Allergic rhinitis 10/11/2007    Priority: Low    Qualifier: Diagnosis of  By: Tiney Rouge CMA, Ellison Hughs     . Depression, major, single episode, moderate ([place]) 07/11/2020  . Barrett's esophagus with esophagitis 08/29/2015    Formatting of this note might be different from the original. Last EGD 2018, no evidence of Barrett's esophagus, no further EGD recommended     Social History: Patient  reports that she has never smoked. She has never used smokeless tobacco. She reports current alcohol use of about 1.0 standard drink of alcohol per week. She reports that she does not use drugs.  Review of Systems: Ophthalmic: negative for eye pain, loss of vision or double vision Cardiovascular: negative for chest pain Respiratory: negative for SOB or persistent cough Gastrointestinal: negative for abdominal pain Genitourinary: negative for dysuria or gross hematuria MSK: negative for foot lesions Neurologic: negative for weakness or gait disturbance  Objective  Vitals: BP 120/74   Pulse 74   Temp 97.7 F (36.5 C) (Temporal)   Wt 217 lb (98.4 kg)   SpO2 96%   BMI 39.69 kg/m  General: well appearing, no acute distress  Psych:  Alert and oriented, normal mood  and affect HEENT:  Normocephalic, atraumatic, moist mucous membranes, supple neck  Cardiovascular:  Nl S1 and S2, RRR without murmur, gallop or rub. no edema Respiratory:  Good breath sounds bilaterally, CTAB with normal effort, no rales Foot exam: no erythema, pallor, or cyanosis visible nl proprioception and sensation to monofilament testing bilaterally, +2 distal pulses bilaterally   Diabetic education: ongoing education regarding chronic disease management for diabetes was given today. We continue to reinforce the ABC's of diabetic management: A1c (<7 or 8 dependent upon patient), tight blood pressure control, and cholesterol management with goal LDL < 100 minimally. We discuss diet strategies, exercise recommendations, medication options and possible side effects. At each visit, we review recommended immunizations and preventive care recommendations for diabetics and stress that good diabetic control can prevent other problems. See below for this patient's data.    Commons side effects, risks, benefits, and alternatives for medications and treatment plan prescribed today were discussed, and the patient expressed understanding of the given instructions. Patient is instructed to call or message via MyChart if he/she has any questions or concerns regarding our treatment plan. No barriers to understanding were identified. We discussed Red Flag symptoms and signs in detail. Patient expressed understanding regarding what to do in case of urgent or emergency type symptoms.   Medication list was reconciled, printed and provided to the patient in AVS. Patient instructions and summary information was reviewed with the patient as documented in the AVS. This note was prepared with assistance of Dragon voice recognition software. Occasional wrong-word or sound-a-like substitutions may have occurred due to the inherent limitations of voice recognition software  This visit occurred during the SARS-CoV-2 public  health emergency.  Safety protocols were in place, including screening questions prior to the visit, additional usage of staff PPE, and extensive cleaning of exam room while observing appropriate contact time as indicated for disinfecting solutions.

## 2020-10-18 ENCOUNTER — Other Ambulatory Visit: Payer: Self-pay

## 2020-10-18 MED ORDER — MELOXICAM 7.5 MG PO TABS: 7.5000 mg | ORAL_TABLET | Freq: Every day | ORAL | 3 refills | Status: DC | PRN

## 2020-10-18 MED ORDER — LEVOTHYROXINE SODIUM 150 MCG PO TABS: 150.0000 ug | ORAL_TABLET | Freq: Every day | ORAL | 3 refills | Status: DC

## 2020-10-25 ENCOUNTER — Telehealth: Payer: Self-pay | Admitting: Family Medicine

## 2020-10-25 NOTE — Chronic Care Management (AMB) (Signed)
  Chronic Care Management   Note  10/25/2020 Name: Miranda Mathis MRN: [id number] DOB: 1951/02/19  [name] is a 71 y.o. year old female who is a primary care patient of Leamon Arnt, MD. I reached out to [employer] by phone today in response to a referral sent by [name]'s PCP, Leamon Arnt, MD.   [name] was given information about Chronic Care Management services today including:  1. CCM service includes personalized support from designated clinical staff supervised by her physician, including individualized plan of care and coordination with other care providers 2. 24/7 contact phone numbers for assistance for urgent and routine care needs. 3. Service will only be billed when office clinical staff spend 20 minutes or more in a month to coordinate care. 4. Only one practitioner may furnish and bill the service in a calendar month. 5. The patient may stop CCM services at any time (effective at the end of the month) by phone call to the office staff.   Patient agreed to services and verbal consent obtained.   Follow up plan:   Lauretta Grill [employer] Scheduler

## 2020-11-25 DIAGNOSIS — R9389 Abnormal findings on diagnostic imaging of other specified body structures: Secondary | ICD-10-CM | POA: Diagnosis not present

## 2020-11-25 DIAGNOSIS — N135 Crossing vessel and stricture of ureter without hydronephrosis: Secondary | ICD-10-CM | POA: Diagnosis not present

## 2020-11-26 ENCOUNTER — Encounter: Payer: Self-pay | Admitting: Physician Assistant

## 2020-11-26 ENCOUNTER — Other Ambulatory Visit: Payer: Self-pay

## 2020-11-26 ENCOUNTER — Ambulatory Visit (INDEPENDENT_AMBULATORY_CARE_PROVIDER_SITE_OTHER): Payer: Medicare Other | Admitting: Physician Assistant

## 2020-11-26 DIAGNOSIS — L409 Psoriasis, unspecified: Secondary | ICD-10-CM

## 2020-11-26 MED ORDER — TRIAMCINOLONE ACETONIDE 40 MG/ML IJ SUSP
20.0000 mg | Freq: Once | INTRAMUSCULAR | Status: AC
  Administered 2020-11-26: 20 mg

## 2020-11-26 MED ORDER — CLOBETASOL PROPIONATE 0.05 % EX SOLN: 1.0000 "application " | Freq: Two times a day (BID) | CUTANEOUS | 6 refills | Status: DC

## 2020-11-26 NOTE — Progress Notes (Signed)
   Follow-Up Visit   Subjective  Miranda Mathis is a 70 y.o. female who presents for the following: Psoriasis (Patient here today for follow up per patient she's noticed improvement under the left and right inframammary folds, but she hasn't noticed much improvement on the scalp). The shots last time helped significantly. Denies chronic scratching.   The following portions of the chart were reviewed this encounter and updated as appropriate:  Tobacco  Allergies  Meds  Problems  Med Hx  Surg Hx  Fam Hx      Objective  Well appearing patient in no apparent distress; mood and affect are within normal limits.  A focused examination was performed including scalp, submammary. Relevant physical exam findings are noted in the Assessment and Plan.  Objective  Mid Occipital Scalp: Thick silvery scale on an erythematous base. Lichenification.  Assessment & Plan  Psoriasis Mid Occipital Scalp  clobetasol (TEMOVATE) 0.05 % external solution - Mid Occipital Scalp  Intralesional injection - Mid Occipital Scalp Location: occuput scalp  Informed Consent: Discussed risks (infection, pain, bleeding, bruising, thinning of the skin, loss of skin pigment, lack of resolution, and recurrence of lesion) and benefits of the procedure, as well as the alternatives. Informed consent was obtained. Preparation: The area was prepared a standard fashion.  Anesthesia: none  Procedure Details: An intralesional injection was performed with Kenalog 20 mg/cc. 2 cc in total were injected.  Total number of injections: 10  Plan: The patient was instructed on post-op care. Recommend OTC analgesia as needed for pain.   Other Related Medications fluocinonide-emollient (LIDEX-E) 0.05 % cream Desonide cream 0.05% to use with clotrimazole cream.   I, Keinan Brouillet, PA-C, have reviewed all documentation's for this visit.  The documentation on 11/26/20 for the exam, diagnosis, procedures and orders are all  accurate and complete.

## 2020-11-28 ENCOUNTER — Telehealth: Payer: Self-pay

## 2020-11-28 NOTE — Chronic Care Management (AMB) (Signed)
Chronic Care Management Pharmacy Assistant   Name: Miranda Mathis  MRN: [id number] DOB: Feb 15, 1951  Miranda Mathis is an 70 y.o. year old female who presents for her initial CCM visit with the clinical pharmacist.  Recent office visits:  10/16/20- Miranda Chang MD (PCP)- Office visit for type 2 DM.  Labs completed. No medication changes. Follow up in 6 months.  07/11/20-Miranda Andy MD (PCP)- Office visit for type 2 DM.  Labs completed. INCREASED Gabapentin 600mg  from twice daily to three times daily. INCREASED Sertraline from 50mg  daily to 100mg  daily. Follow up in 3 months.   Recent consult visits:  11/26/20- Miranda Slates PA-C (Dematology)- Office visit for Psoriasis. Intralesional injection of 2cc's of Kenalog 20mg /cc.Recommended OTC analgesia as needed for pain. DISCONTINUED Alclometasone Dipropionate0.05% from med list. Follow up in 3 months.  11/25/20 Miranda Bene MD (Urology)-Per encounter DISCONTINUED Levothyroxine 137MCG, Hydrocodone 10/325mg , Hyoscyamine 0.125mg , Oxybutynin 5mg . Follow up prn. 09/26/20-Miranda Shefield PA-C (Dematology)- Office visit for Psoriasis. Intralesional injection of 3cc's of Kenalog 10mg /cc.Recommended OTC analgesia as needed for pain. Follow up in 8 weeks.  08/20/20-Miranda Evans MD (Urology)- Office visit for follow up left ureteral stricture. No medication changes, Follow up in 3 months.  08/01/20-Miranda Larocco MD (Urology)- Office visit for left flank pain. Urinalysis completed. STARTED Bactrim DS 800/160mg  (directions unavailable).  Follow up as scheduled.  08/01/20 Miranda Mathis (Dermatology)- Office visit for Psoriasis. STARTED Aclometasone Dipropionate 0.05% apply under breast and abdomen daily, Clobetasol Propionate 4.33% 1 application topical 2 times daily to scalp, Fluocinonide Emulsified Base 0.05% apply on body daily not on face or folds. Follow up in 8 weeks.   Hospital visits:  Medication Reconciliation was completed by comparing discharge  summary, patient's EMR and Pharmacy list, and upon discussion with patient.  Admitted to the hospital on 06/07/20 due to abdominal pain. Discharge date was 06/08/20. Discharged from [facility]?Medications Started at [facility] Discharge:?? -started Cephalexin 500mg  take 1 three times daily for 10 days due to Pyelonephritis -started Oxycodone-Acetaminophen 5/325mg  take 1 every 8 hours as needed due to Pyelonephritis -started Rocephin IV -Ondansetron 4mg  take 1 every 8 hours due to Pyelonephritis Medication Changes at Hospital Discharge: -Changed None  Medications Discontinued at Hospital Discharge: -Stopped None   Medications that remain the same after Hospital Discharge:??  -All other medications will remain the same.    Medications: Outpatient Encounter Medications as of 11/28/2020  Medication Sig Note   Cholecalciferol (VITAMIN D) 50 MCG (2000 UT) tablet Take 2,000 Units by mouth daily.     clobetasol (TEMOVATE) 0.05 % external solution Apply 1 application topically 2 (two) times daily. Scalp    fluconazole (DIFLUCAN) 200 MG tablet TAKE 1 TABLET BY MOUTH EVERY 3 DAYS 08/05/2020: PRN   fluocinonide-emollient (LIDEX-E) 0.05 % cream Apply on body daily not on face or folds    gabapentin (NEURONTIN) 300 MG capsule Take 2 capsules (600 mg total) by mouth 3 (three) times daily.    HYDROcodone-acetaminophen (NORCO) 10-325 MG tablet Take 1 tablet by mouth every 6 (six) hours as needed for severe pain.    hyoscyamine (LEVSIN) 0.125 MG tablet TAKE 1 TABLET (0.125 MG TOTAL) BY MOUTH EVERY 4 (FOUR) HOURS AS NEEDED.    levothyroxine (SYNTHROID) 150 MCG tablet Take 1 tablet (150 mcg total) by mouth daily.    lisinopril (ZESTRIL) 5 MG tablet TAKE 1 TABLET BY MOUTH EVERY DAY    meloxicam (MOBIC) 7.5 MG tablet Take 1 tablet (7.5 mg total)  by mouth daily as needed. for pain    metFORMIN (GLUCOPHAGE) 500 MG tablet TAKE 1 TABLET (500 MG TOTAL) BY MOUTH 2 (TWO) TIMES DAILY WITH A MEAL.     omeprazole (PRILOSEC) 40 MG capsule TAKE 1 CAPSULE BY MOUTH EVERY DAY    ondansetron (ZOFRAN) 4 MG tablet Take 4 mg by mouth every 8 (eight) hours as needed for nausea or vomiting.    oxybutynin (DITROPAN-XL) 5 MG 24 hr tablet Take 5 mg by mouth at bedtime. 08/05/2020: Three times a day   rosuvastatin (CRESTOR) 40 MG tablet Take 1 tablet (40 mg total) by mouth daily.    senna-docusate (SENOKOT-S) 8.6-50 MG tablet Take 2 tablets by mouth every evening.     sertraline (ZOLOFT) 100 MG tablet Take 1 tablet (100 mg total) by mouth daily.    tamsulosin (FLOMAX) 0.4 MG CAPS capsule Take by mouth.    zolpidem (AMBIEN) 10 MG tablet Take 1 tablet (10 mg total) by mouth at bedtime as needed for sleep. TAKE 1 TABLET BY MOUTH EVERY DAY AT BEDTIME AS NEEDED FOR SLEEP    [DISCONTINUED] pravastatin (PRAVACHOL) 40 MG tablet Take 1 tablet (40 mg total) by mouth daily.    No facility-administered encounter medications on file as of 11/28/2020.   Current Documented Medications Cholecalciferol 80mcg  OTC Clobetasol 0.05% last filled 11/26/20 30DS Fluconazole 200mg  last filled 05/13/20  9DS Fluocinolone-emollient 0.05% cream last filled 08/01/20  15DS Gabapentin 300mg   last filled 10/09/20  90DS Hydrocodone-acetaminophen 10/325mg   last filled 06/03/20  5 DS Hyoscyamine 0.125mg   Last filled 05/21/20  5 DS Levothyroxine 149mcg  last filled 10/18/20  90 DS Lisinopril 5mg   Last filled 11/05/20  90 DS Meloxicam 7.5mg   Last filled 10/18/20  30 DS Metformin 500mg   last filled 09/06/20  90 DS Omeprazole 40mg   last filled 10/07/20  90 DS Ondansetron 4mg   last filled 06/08/20  3 DS Oxybutynin 5mg   last filled 07/19/20  20DS Rosuvastatin 40mg   last filled 10/29/20  90 DS Senna Docusate 8.6-50mg    OTC Sertraline 100mg   last filled 10/07/20  90 DS Tamsulosin 0.4mg   last filled 09/02/20  90 DS Zolpidem 10mg   last filled 05/20/2  30 DS  Star Rating Drugs:  Lisinopril 5mg   last filled 11/05/20 90 DS Metformin 500mg   last  filled 09/06/20 90 DS Rosuvastatin 40mg   last filled 05//10/22 90DS  Have you seen any other providers since your last visit? Patient stated she recently saw her Dermatologist and Urologist.  Notes summarized above.   Any changes in your medications or health?  Patient stated the urologist advised her to stop Flomax on Monday 11/25/20. (Did not find this in the encounter)  Any side effects from any medications?  Patient reported some constipation for which she takes colace.  Do you have an symptoms or problems not managed by your medications?  Patient denies unmanaged symptoms or problems.   Any concerns about your health right now?  Patient reported she has "waves" of depression.  Has your provider asked that you check blood pressure, blood sugar, or follow special diet at home?  Patient stated her doctor told her she does not have to monitor BS.   Do you get any type of exercise on a regular basis?  Patient reported she is very active with doing yard work and running errands.  Can you think of a goal you would like to reach for your health?  Patient did not have a goal to set.   Do you  have any problems getting your medications?  Patient stated she does not have problems obtaining medications.  One of the creams she was prescribed cost $40 at her pharmacy, but she was able to find it cheaper at [facility].     Is there anything that you would like to discuss during the appointment?  Patient wants to know if any of the medications she takes is photosensitive.  Please bring medications and supplements to appointment Appt confirmed.    [place]

## 2020-12-02 ENCOUNTER — Ambulatory Visit (INDEPENDENT_AMBULATORY_CARE_PROVIDER_SITE_OTHER): Payer: Medicare Other

## 2020-12-02 ENCOUNTER — Other Ambulatory Visit: Payer: Self-pay

## 2020-12-02 DIAGNOSIS — Z780 Asymptomatic menopausal state: Secondary | ICD-10-CM

## 2020-12-02 DIAGNOSIS — E1142 Type 2 diabetes mellitus with diabetic polyneuropathy: Secondary | ICD-10-CM | POA: Diagnosis not present

## 2020-12-02 DIAGNOSIS — F321 Major depressive disorder, single episode, moderate: Secondary | ICD-10-CM | POA: Diagnosis not present

## 2020-12-02 DIAGNOSIS — E782 Mixed hyperlipidemia: Secondary | ICD-10-CM

## 2020-12-02 DIAGNOSIS — M8580 Other specified disorders of bone density and structure, unspecified site: Secondary | ICD-10-CM

## 2020-12-02 DIAGNOSIS — E039 Hypothyroidism, unspecified: Secondary | ICD-10-CM

## 2020-12-02 MED ORDER — SERTRALINE HCL 100 MG PO TABS: 150.0000 mg | ORAL_TABLET | Freq: Every day | ORAL | 0 refills | Status: DC

## 2020-12-02 NOTE — Patient Instructions (Addendum)
Ms. Burdin,  Thank you for talking with me today. I have included our care plan/goals in the following pages.   No orders of the defined types were placed in this encounter. .med  Please review and call me at [phone] with any questions.  Thanks! Ellin Mayhew, Pharm.D., BCGP Clinical Pharmacist [facility] [place] [phone] Patient Care Plan: ccm pharmacy care plan     Problem Identified: T2DM, HLD, Osteopenia, GERD (barret's esophagus), hypothyroidism, insomnia, depression, GAD   Priority: High     Long-Range Goal: Disease Management   Start Date: 12/02/2020  Expected End Date: 12/02/2021  This Visit's Progress: On track  Priority: High  Note:    Pharmacist Clinical Goal(s):  Patient will contact provider office for questions/concerns as evidenced notation of same in electronic health record through collaboration with PharmD and provider.   Interventions: 1:1 collaboration with Leamon Arnt, MD regarding development and update of comprehensive plan of care as evidenced by provider attestation and co-signature Inter-disciplinary care team collaboration (see longitudinal plan of care) Comprehensive medication review performed; medication list updated in electronic medical record  Hyperlipidemia: (LDL goal < 70) -Controlled -Current treatment: Rosuvastatin 40 mg once daily -Medications previously tried: Pravastatin 20 mg once daily  -Current dietary patterns: heaviest meal at night - peas, corn, some greens. -Current exercise habits: walking/yard work -Educated on Cholesterol goals;  -Recommended to continue current medication  Depression (Goal: minimize symptoms) -Not ideally controlled -Reports ongoing symptoms, spends a lot of time thinking about daughters care and goes through weeks of feeling down. Is wondering if current sertraline 100 mg once daily could be adjusted to higher dose. Not currently seeing  behavioral health but is very open to this. -Current treatment: Sertraline 100 mg once daily -Medications previously tried/failed: citalopram (did not help), bupropion (insomnia), trintellix (possible cost issue), amitriptyline (photosensitivity) -PHQ9: 13 -Provided BH contact information for mental health support -Educated on Benefits of medication for symptom control Benefits of cognitive-behavioral therapy with or without medication -Modify - sertraline increase to 1.5 tablets (150 mg daily) CPA review sertraline tolerability and depression symptoms with patient in 1 month w/ PHQ  Diabetes (A1c goal <7%) -Controlled -GFR 40s -Current medications: Metformin 500 mg twice daily  -Current home glucose readings: none obtained  -Denies hypoglycemic/hyperglycemic symptoms -Educated on A1c and blood sugar goals; -Recommended to continue current medication  Hypothyroidism (Goal: ensure consistent and appropriate medicate use) -Uncontrolled -Current treatment  Levothyroxine 150 mcg once daily (increased from 137 mcg 10/16/2020) -Counseled on proper administration - no interference noted with foods/other meds -Recommended to continue current medication Already has lab scheduled for 12/09/2020 for TSH recheck  Osteopenia (Goal ensure preventative screening) -Controlled -Last DEXA Scan: due - requesting DEXA and MG  -Patient is not a candidate for pharmacologic treatment at this time -Current treatment  Vitamin D3 2000 units once daily  -Recommend weight-bearing and muscle strengthening exercises for building and maintaining bone density. Recommend repeat DEXA - last 06/2018  Patient Goals/Self-Care Activities Patient will:  - take medications as prescribed target a minimum of 150 minutes of moderate intensity exercise weekly  Follow Up Plan: f/u telephone call 2-4 weeks to see how patient is doing with sertraline dose increase  There are no care plans that you recently modified to  display for this patient.  Medication Assistance: None required.  Patient affirms current coverage meets needs.       The patient was given the following information about  Chronic Care Management services today, agreed to services, and gave verbal consent: 1. CCM service includes personalized support from designated clinical staff supervised by the primary care provider, including individualized plan of care and coordination with other care providers 2. 24/7 contact phone numbers for assistance for urgent and routine care needs. 3. Service will only be billed when office clinical staff spend 20 minutes or more in a month to coordinate care. 4. Only one practitioner may furnish and bill the service in a calendar month. 5.The patient may stop CCM services at any time (effective at the end of the month) by phone call to the office staff. 6. The patient will be responsible for cost sharing (co-pay) of up to 20% of the service fee (after annual deductible is met). Patient agreed to services and consent obtained.  The patient verbalized understanding of instructions provided today and agreed to receive a MyChart copy of patient instruction and/or educational materials. Telephone follow up appointment with pharmacy team member scheduled for: See next appointment with "Care Management Staff" under "What's Next" below.

## 2020-12-02 NOTE — Progress Notes (Signed)
Chronic Care Management Pharmacy Note  12/02/2020 Name:  EMPRESS NEWMANN MRN:  [id number] DOB:  05-21-1951  Recommendations/Changes made from today's visit:  #1-sertraline increased to 1.5 tablets (150 mg) daily and provided contact for behavioral health. Phq of 13 today. Will f/u in 2-4 weeks with patient. Rx sent to pharmacy #2-pt requesting new orders for DEXA and mammogram, would be due for each - if ok, I will reach out to teamcare to request order  Subjective: Anyeli R Burkard is an 70 y.o. year old female who is a primary patient of Leamon Arnt, MD.  The CCM team was consulted for assistance with disease management and care coordination needs.    Engaged with patient by telephone for follow up visit in response to provider referral for pharmacy case management and/or care coordination services.   Consent to Services:  The patient was given the following information about Chronic Care Management services today, agreed to services, and gave verbal consent: 1. CCM service includes personalized support from designated clinical staff supervised by the primary care provider, including individualized plan of care and coordination with other care providers 2. 24/7 contact phone numbers for assistance for urgent and routine care needs. 3. Service will only be billed when office clinical staff spend 20 minutes or more in a month to coordinate care. 4. Only one practitioner may furnish and bill the service in a calendar month. 5.The patient may stop CCM services at any time (effective at the end of the month) by phone call to the office staff. 6. The patient will be responsible for cost sharing (co-pay) of up to 20% of the service fee (after annual deductible is met). Patient agreed to services and consent obtained.  Patient Care Team: Leamon Arnt, MD as PCP - General (Family Medicine) Festus Aloe, MD (Urology) Gatha Mayer, MD (Gastroenterology) Curt Jews, [place]  (Optometry) Melina Schools, MD as Consulting Physician (Orthopedic Surgery) Starlyn Skeans as Physician Assistant (Dermatology) Madelin Rear, [facility] as Pharmacist (Pharmacist)  Objective:  Lab Results  Component Value Date   CREATININE 1.20 10/16/2020   CREATININE 1.14 07/11/2020   CREATININE 1.03 (H) 06/07/2020    Lab Results  Component Value Date   HGBA1C 6.8 (A) 10/16/2020   Last diabetic Eye exam: No results found for: HMDIABEYEEXA  Last diabetic Foot exam: No results found for: HMDIABFOOTEX      Component Value Date/Time   CHOL 123 10/16/2020 1100   TRIG 75.0 10/16/2020 1100   HDL 41.60 10/16/2020 1100   CHOLHDL 3 10/16/2020 1100   VLDL 15.0 10/16/2020 1100   LDLCALC 66 10/16/2020 1100   LDLDIRECT 165.3 11/13/2010 0839    Hepatic Function Latest Ref Rng & Units 10/16/2020 07/11/2020 06/07/2020  Total Protein 6.0 - 8.3 g/dL 7.1 7.2 6.7  Albumin 3.5 - 5.2 g/dL 4.1 4.1 3.7  AST 0 - 37 U/L 36 33 44(H)  ALT 0 - 35 U/L 31 30 38  Alk Phosphatase 39 - 117 U/L 63 58 52  Total Bilirubin 0.2 - 1.2 mg/dL 0.4 0.5 0.6  Bilirubin, Direct 0.0 - 0.3 mg/dL - - -    Lab Results  Component Value Date/Time   TSH 12.60 (H) 10/16/2020 11:00 AM   TSH 9.31 (H) 07/11/2020 12:18 PM    CBC Latest Ref Rng & Units 07/11/2020 06/07/2020 03/25/2020  WBC 4.0 - 10.5 K/uL 7.3 8.2 8.3  Hemoglobin 12.0 - 15.0 g/dL 12.9 12.6 12.8  Hematocrit 36.0 - 46.0 %  39.3 39.6 38.8  Platelets 150.0 - 400.0 K/uL 213.0 185 207.0   PHQ9 SCORE ONLY 12/02/2020 08/05/2020 04/08/2020  PHQ-9 Total Score _0 No results found for: VD25OH  Clinical ASCVD:  The ASCVD Risk score Mikey Bussing DC Jr., et al., 2013) failed to calculate for the following reasons:   The valid total cholesterol range is 130 to 320 mg/dL    Social History   Tobacco Use  Smoking Status Never  Smokeless Tobacco Never   BP Readings from Last 3 Encounters:  10/16/20 120/74  07/11/20 116/72  06/08/20 125/60   Pulse Readings from  Last 3 Encounters:  10/16/20 74  07/11/20 71  06/08/20 (!) 58   Wt Readings from Last 3 Encounters:  10/16/20 217 lb (98.4 kg)  07/11/20 208 lb 12.8 oz (94.7 kg)  06/07/20 215 lb (97.5 kg)   Assessment: Review of patient past medical history, allergies, medications, health status, including review of consultants reports, laboratory and other test data, was performed as part of comprehensive evaluation and provision of chronic care management services.   SDOH:  (Social Determinants of Health) assessments and interventions performed: Yes   CCM Care Plan  Allergies  Allergen Reactions   Ketorolac     Other reaction(s): Other (See Comments) Not supposed to take    Medications Reviewed Today     Reviewed by Madelin Rear, [facility] (Pharmacist) on 12/02/20 at 1131  Med List Status: <None>   Medication Order Taking? Sig Documenting Provider Last Dose Status Informant  Cholecalciferol (VITAMIN D) 50 MCG (2000 UT) tablet [id number] Yes Take 2,000 Units by mouth daily.  [provider] Taking Active Self  clobetasol (TEMOVATE) 0.05 % external solution [id number]  Apply 1 application topically 2 (two) times daily. Scalp Warren Danes, PA-C  Active   fluconazole (DIFLUCAN) 200 MG tablet [id number]  TAKE 1 TABLET BY MOUTH EVERY 3 DAYS Lavonna Monarch, MD  Active            Med Note Lonie Peak Aug 05, 2020 10:24 AM) PRN  fluocinonide-emollient (LIDEX-E) 0.05 % cream [id number]  Apply on body daily not on face or folds Sheffield, Kelli R, PA-C  Active   gabapentin (NEURONTIN) 300 MG capsule [id number]  Take 2 capsules (600 mg total) by mouth 3 (three) times daily. Leamon Arnt, MD  Active   levothyroxine (SYNTHROID) 150 MCG tablet [id number] Yes Take 1 tablet (150 mcg total) by mouth daily. Leamon Arnt, MD Taking Active   lisinopril (ZESTRIL) 5 MG tablet [id number] Yes TAKE 1 TABLET BY MOUTH EVERY DAY Leamon Arnt, MD Taking Active   meloxicam (MOBIC) 7.5 MG tablet  [id number]  Take 1 tablet (7.5 mg total) by mouth daily as needed. for pain Leamon Arnt, MD  Active   metFORMIN (GLUCOPHAGE) 500 MG tablet [id number] Yes TAKE 1 TABLET (500 MG TOTAL) BY MOUTH 2 (TWO) TIMES DAILY WITH A MEAL. Leamon Arnt, MD Taking Active   omeprazole (PRILOSEC) 40 MG capsule [id number] Yes TAKE 1 CAPSULE BY MOUTH EVERY DAY Leamon Arnt, MD Taking Active   ondansetron (ZOFRAN) 4 MG tablet [id number]  Take 4 mg by mouth every 8 (eight) hours as needed for nausea or vomiting. [provider]  Active     Discontinued 06/06/19 1638 (Change in therapy)   rosuvastatin (CRESTOR) 40 MG tablet [id number]  Take 1 tablet (40 mg total) by mouth daily. Leamon Arnt, MD  Active   senna-docusate (SENOKOT-S)  8.6-50 MG tablet [id number]  Take 2 tablets by mouth every evening.  [provider]  Active Self  sertraline (ZOLOFT) 100 MG tablet [id number]  Take 1 tablet (100 mg total) by mouth daily. Leamon Arnt, MD  Active   zolpidem (AMBIEN) 10 MG tablet [id number]  Take 1 tablet (10 mg total) by mouth at bedtime as needed for sleep. TAKE 1 TABLET BY MOUTH EVERY DAY AT BEDTIME AS NEEDED FOR SLEEP Orma Flaming, MD  Active             Patient Active Problem List   Diagnosis Date Noted   Depression, major, single episode, moderate ([place]) 07/11/2020   Osteopenia after menopause 06/06/2019   Bilateral hydronephrosis    Degenerative scoliosis 09/07/2018   Seborrheic dermatitis of scalp 12/02/2017   Psoriasis 12/02/2017   DM type 2 with diabetic peripheral neuropathy ([place]) 09/02/2016   Recurrent kidney stones 08/29/2015   History of Barrett's esophagus 08/29/2015   GAD (generalized anxiety disorder) 08/29/2015   GERD (gastroesophageal reflux disease) 08/29/2015   Irritable bowel syndrome with constipation 08/29/2015   Lumbosacral spondylosis with radiculopathy 08/29/2015   Barrett's esophagus with esophagitis 08/29/2015   Primary insomnia 10/25/2014   Acquired  hypothyroidism 11/02/2013   Mixed hyperlipidemia 04/04/2012   Allergic rhinitis 10/11/2007   Fibromyalgia 10/11/2007   Chronic low back pain 10/11/2007    Immunization History  Administered Date(s) Administered   Fluad Quad(high Dose 65+) 04/13/2019, 04/04/2020   Influenza Whole 03/22/2001, 03/22/2006, 03/22/2010   Influenza, High Dose Seasonal PF 02/19/2016, 06/02/2018   Influenza, Seasonal, Injecte, Preservative Fre 06/27/2012   Influenza-Unspecified 04/13/2019   Moderna Sars-Covid-2 Vaccination 08/14/2019   PFIZER(Purple Top)SARS-COV-2 Vaccination 07/17/2019, 07/24/2019, 05/08/2020   Pneumococcal Conjugate-13 11/27/2015   Pneumococcal Polysaccharide-23 11/13/2010   Tdap 11/13/2010, 09/14/2020   Zoster Recombinat (Shingrix) 04/13/2019   Zoster, Live 11/27/2015   Conditions to be addressed/monitored: T2DM, HLD, Osteopenia, GERD (barret's esophagus), hypothyroidism, insomnia, depression, GAD  Care Plan : ccm pharmacy care plan  Updates made by Madelin Rear, [facility] since 12/02/2020 12:00 AM     Problem: T2DM, HLD, Osteopenia, GERD (barret's esophagus), hypothyroidism, insomnia, depression, GAD   Priority: High     Long-Range Goal: Disease Management   Start Date: 12/02/2020  Expected End Date: 12/02/2021  This Visit's Progress: On track  Priority: High  Note:    Pharmacist Clinical Goal(s):  Patient will contact provider office for questions/concerns as evidenced notation of same in electronic health record through collaboration with PharmD and provider.   Interventions: 1:1 collaboration with Leamon Arnt, MD regarding development and update of comprehensive plan of care as evidenced by provider attestation and co-signature Inter-disciplinary care team collaboration (see longitudinal plan of care) Comprehensive medication review performed; medication list updated in electronic medical record  Hyperlipidemia: (LDL goal < 70) -Controlled -Current treatment: Rosuvastatin  40 mg once daily -Medications previously tried: Pravastatin 20 mg once daily  -Current dietary patterns: heaviest meal at night - peas, corn, some greens. -Current exercise habits: walking/yard work -Educated on Cholesterol goals;  -Recommended to continue current medication  Depression (Goal: minimize symptoms) -Not ideally controlled -Reports ongoing symptoms, spends a lot of time thinking about daughters care and goes through weeks of feeling down. Is wondering if current sertraline 100 mg once daily could be adjusted to higher dose. Not currently seeing behavioral health but is very open to this. -Current treatment: Sertraline 100 mg once daily -Medications previously tried/failed: citalopram (did not help), bupropion (insomnia), trintellix (possible cost issue), amitriptyline (  photosensitivity) -PHQ9: 13 -Provided BH contact information for mental health support -Educated on Benefits of medication for symptom control Benefits of cognitive-behavioral therapy with or without medication -Modify - sertraline increase to 1.5 tablets (150 mg daily) CPA review sertraline tolerability and depression symptoms with patient in 1 month w/ PHQ  Diabetes (A1c goal <7%) -Controlled -GFR 40s -Current medications: Metformin 500 mg twice daily  -Current home glucose readings: none obtained  -Denies hypoglycemic/hyperglycemic symptoms -Educated on A1c and blood sugar goals; -Recommended to continue current medication  Hypothyroidism (Goal: ensure consistent and appropriate medicate use) -Uncontrolled -Current treatment  Levothyroxine 150 mcg once daily (increased from 137 mcg 10/16/2020) -Counseled on proper administration - no interference noted with foods/other meds -Recommended to continue current medication Already has lab scheduled for 12/09/2020 for TSH recheck  Osteopenia (Goal ensure preventative screening) -Controlled -Last DEXA Scan: due - requesting DEXA and MG  -Patient is not a  candidate for pharmacologic treatment at this time -Current treatment  Vitamin D3 2000 units once daily  -Recommend weight-bearing and muscle strengthening exercises for building and maintaining bone density. Recommend repeat DEXA - last 06/2018  Patient Goals/Self-Care Activities Patient will:  - take medications as prescribed target a minimum of 150 minutes of moderate intensity exercise weekly  Follow Up Plan: f/u telephone call 2-4 weeks to see how patient is doing with sertraline dose increase  There are no care plans that you recently modified to display for this patient.  Medication Assistance: None required.  Patient affirms current coverage meets needs.    Current Barriers:  Unable to maintain control of hypothyroidism, depression Patient's preferred pharmacy is:  [id number] [place], [place] - [address] [place] [id number]: [phone]: [address] [place] [address] [place]Phone: [phone]: [phone] Follow Up:  Patient agrees to Care Plan and Follow-up.  Future Appointments  Date Time Provider [place] 12/09/2020 10:15 AM [facility] LAB [facility] PEC  02/26/2021 10:45 AM SWarren Danes PA-C [facility] CD[facility]SO  04/15/2021 10:30 AM ALeamon Arnt MD [facility] 08/11/2021 11:00 AM [facility] [place] PharmD, CPP Clinical Pharmacist Practitioner  [facility]  [phone]

## 2020-12-09 ENCOUNTER — Other Ambulatory Visit: Payer: Medicare Other

## 2020-12-11 ENCOUNTER — Other Ambulatory Visit: Payer: Self-pay | Admitting: Family Medicine

## 2021-01-02 ENCOUNTER — Telehealth: Payer: Self-pay | Admitting: Pharmacist

## 2021-01-02 NOTE — Chronic Care Management (AMB) (Addendum)
Chronic Care Management Pharmacy Assistant   Name: Miranda Mathis  MRN: [id number] DOB: 1950-10-06  Reason for Encounter: Disease State    Recent office visits:  None  Recent consult visits:  None  Hospital visits:  None in previous 6 months  Medications: Outpatient Encounter Medications as of 01/02/2021  Medication Sig Note   Cholecalciferol (VITAMIN D) 50 MCG (2000 UT) tablet Take 2,000 Units by mouth daily.     clobetasol (TEMOVATE) 0.05 % external solution Apply 1 application topically 2 (two) times daily. Scalp    fluconazole (DIFLUCAN) 200 MG tablet TAKE 1 TABLET BY MOUTH EVERY 3 DAYS 08/05/2020: PRN   fluocinonide-emollient (LIDEX-E) 0.05 % cream Apply on body daily not on face or folds    gabapentin (NEURONTIN) 300 MG capsule Take 2 capsules (600 mg total) by mouth 3 (three) times daily.    levothyroxine (SYNTHROID) 150 MCG tablet Take 1 tablet (150 mcg total) by mouth daily.    lisinopril (ZESTRIL) 5 MG tablet TAKE 1 TABLET BY MOUTH EVERY DAY    meloxicam (MOBIC) 7.5 MG tablet Take 1 tablet (7.5 mg total) by mouth daily as needed. for pain    metFORMIN (GLUCOPHAGE) 500 MG tablet TAKE 1 TABLET (500 MG TOTAL) BY MOUTH 2 (TWO) TIMES DAILY WITH A MEAL.    omeprazole (PRILOSEC) 40 MG capsule TAKE 1 CAPSULE BY MOUTH EVERY DAY    ondansetron (ZOFRAN) 4 MG tablet Take 4 mg by mouth every 8 (eight) hours as needed for nausea or vomiting.    rosuvastatin (CRESTOR) 40 MG tablet Take 1 tablet (40 mg total) by mouth daily.    senna-docusate (SENOKOT-S) 8.6-50 MG tablet Take 2 tablets by mouth every evening.     sertraline (ZOLOFT) 100 MG tablet Take 1.5 tablets (150 mg total) by mouth daily.    zolpidem (AMBIEN) 10 MG tablet TAKE 1 TABLET BY MOUTH AT BEDTIME AS NEEDED FOR SLEEP    [DISCONTINUED] pravastatin (PRAVACHOL) 40 MG tablet Take 1 tablet (40 mg total) by mouth daily.    No facility-administered encounter medications on file as of 01/02/2021.    Have you had any problems  recently with your health? Patient states she has not had any problems recently with her health.  Have you had any problems with your pharmacy? Patient states she has not had any problems recently with her pharmacy.  What issues or side effects are you having with your medications? Patient states she is not currently having any issues or side effects from any of her medications.  What would you like me to pass along to Leata Mouse, CPP for him to help you with?  Patient states she does not have anything for me to pass along at this time.  What can we do to take care of you better? Patient did not have any suggestions. She states she is doing well at this time.  Patient had score of 13 on her PHQ9 on 12/02/2020 -  Medication sertraline increased to 1.5 tablers (150 mg daily).  Patient states she feels much better since she increased her Sertraline to 150 mg a day.  PHQ-9 Questionnaire :   Little interest or pleaser in doing things? 1 Feeling down, depressed, or hopeless? 1 Trouble falling or staying asleep, or sleeping too much? 3 Feeling tired or having little energy? 1 Poor appetite or overeating? 3 Feeling bad about yourself - or that you are a failure or have let yourself or your family down? 1 Trouble concentrating  on things, such as reading the newspaper or watching television? 0 Moving or speaking so slowly that other people could have noticed, or opposite - being so fidgeting or restless that you have been moving around more than usual? 0 Thoughts that you would be better off dead or hurting yourself in some way? 0   Score: 0 = not at all never 1 = some days 2 = nearly half the days 3 = almost every day   10 = Total Score  Future Appointments  Date Time Provider [place]  02/26/2021 10:45 AM Warren Danes, PA-C [facility] CD[facility]SO  03/04/2021  2:00 PM [facility] CCM PHARMACIST [facility] PEC  04/15/2021 10:30 AM Leamon Arnt, MD [facility] PEC  08/11/2021 11:00 AM  [facility] HEALTH COACH [facility] PEC     Star Rating Drugs: Lisinopril 5 mg last filled 11/05/2020 90 DS Rosuvastatin 40 mg last filled 10/29/2020 90 DS Metformin 500 mg last filled 12/04/2000 90 DS  April D Calhoun, [place] Pharmacist Assistant [phone]   5 minutes spent in review, coordination, and documentation.  PHQ-9 score decreased, will continue current medication dosage for now. - CD  Reviewed by: Beverly Milch, PharmD Clinical Pharmacist [phone]

## 2021-01-03 ENCOUNTER — Other Ambulatory Visit: Payer: Self-pay | Admitting: Family Medicine

## 2021-01-07 ENCOUNTER — Other Ambulatory Visit: Payer: Self-pay | Admitting: Family Medicine

## 2021-01-11 ENCOUNTER — Other Ambulatory Visit: Payer: Self-pay | Admitting: Family Medicine

## 2021-01-13 ENCOUNTER — Other Ambulatory Visit: Payer: Self-pay | Admitting: Family Medicine

## 2021-01-13 NOTE — Telephone Encounter (Signed)
Please advise with patient on when she should be able to get the next refill. When the last refill was 12/11/20 for a 30 day supply and patient is completely out.

## 2021-01-13 NOTE — Telephone Encounter (Signed)
  LAST APPOINTMENT DATE:  10/16/20  NEXT APPOINTMENT DATE:'@9'$ /13/2022  MEDICATION: zolpidem (AMBIEN) 10 MG tablet  Is the patient out of medication? YES    [id number] [place], [place] - [address]

## 2021-01-14 MED ORDER — ZOLPIDEM TARTRATE 10 MG PO TABS: 10.0000 mg | ORAL_TABLET | Freq: Every evening | ORAL | 5 refills | Status: DC | PRN

## 2021-01-14 NOTE — Telephone Encounter (Signed)
Patient is calling in, wanting an update.

## 2021-01-14 NOTE — Telephone Encounter (Signed)
Dr.Andy, I called the pharmacy they do not have any refills on file for Zolpidem for pt. Please send Rx. Thanks

## 2021-01-14 NOTE — Telephone Encounter (Signed)
Called pharmacy and spoke to [place] they do not have any refills for Zolpidem for pt . Told her okay I will have  provider send new Rx over. Kim verbalized understanding.

## 2021-01-14 NOTE — Addendum Note (Signed)
Addended by: Billey Chang on: 01/14/2021 12:15 PM   Modules accepted: Orders

## 2021-01-14 NOTE — Addendum Note (Signed)
Addended by: Marian Sorrow on: 01/14/2021 12:00 PM   Modules accepted: Orders

## 2021-02-26 ENCOUNTER — Ambulatory Visit (INDEPENDENT_AMBULATORY_CARE_PROVIDER_SITE_OTHER): Payer: Medicare Other | Admitting: Physician Assistant

## 2021-02-26 ENCOUNTER — Other Ambulatory Visit: Payer: Self-pay

## 2021-02-26 ENCOUNTER — Encounter: Payer: Self-pay | Admitting: Physician Assistant

## 2021-02-26 DIAGNOSIS — L400 Psoriasis vulgaris: Secondary | ICD-10-CM

## 2021-02-26 NOTE — Progress Notes (Signed)
   Follow-Up Visit   Subjective  Miranda Mathis is a 70 y.o. female who presents for the following: Psoriasis (Back still itchy but her scalp seems better. Previously treated with treatment with intralesional Kenalog injections and clobetasol solution. The scale went away but she still itches and has a tendency to scratch.)   The following portions of the chart were reviewed this encounter and updated as appropriate:  Tobacco  Allergies  Meds  Problems  Med Hx  Surg Hx  Fam Hx      Objective  Well appearing patient in no apparent distress; mood and affect are within normal limits.  A full examination was performed including scalp, head, eyes, ears, nose, lips, neck, chest, axillae, abdomen, back, buttocks, bilateral upper extremities, bilateral lower extremities, hands, feet, fingers, toes, fingernails, and toenails. All findings within normal limits unless otherwise noted below.  posterior scalp Well-marginated erythematous  patch entire posterior scalp. No scale today.    Assessment & Plan  Plaque psoriasis posterior scalp  Continue the clobetasol solution. Use it regularly twice a day.    I, Tildon Silveria, PA-C, have reviewed all documentation's for this visit.  The documentation on 02/26/21 for the exam, diagnosis, procedures and orders are all accurate and complete.

## 2021-03-01 ENCOUNTER — Other Ambulatory Visit: Payer: Self-pay | Admitting: Family Medicine

## 2021-03-02 ENCOUNTER — Other Ambulatory Visit: Payer: Self-pay | Admitting: Family Medicine

## 2021-03-04 ENCOUNTER — Telehealth: Payer: Medicare Other

## 2021-04-09 IMAGING — DX DG CHEST 1V PORT
1 series · 1 of 1 positions shown · non-contrast
Comparison: 10/18/2018 and prior radiographs

CLINICAL DATA: Lung crackles on auscultation.

EXAM:
PORTABLE CHEST 1 VIEW

[chest ap]
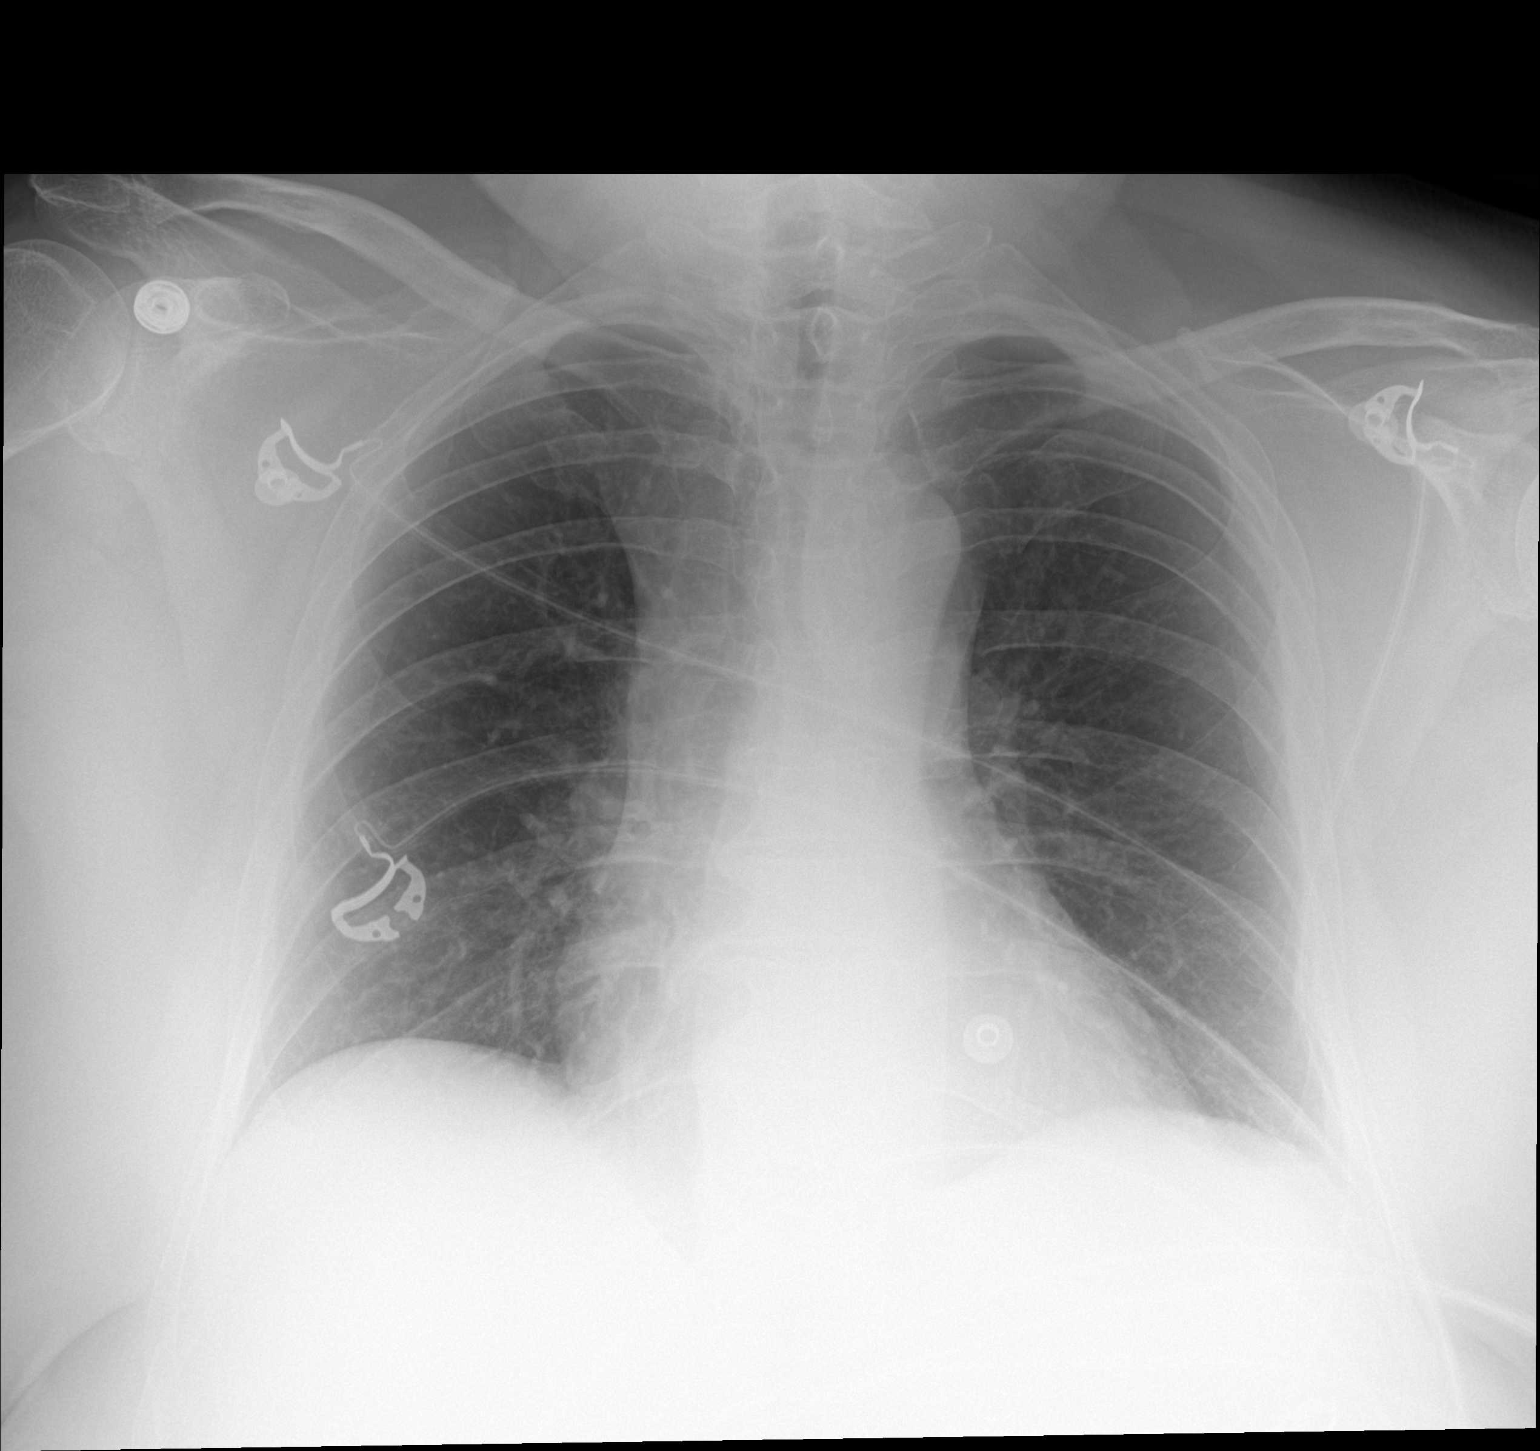

[1 of 1 positions shown; findings below may reference images not displayed]

FINDINGS: The cardiomediastinal silhouette is unchanged.

There is no evidence of focal airspace disease, pulmonary edema,
suspicious pulmonary nodule/mass, pleural effusion, or pneumothorax.

No acute bony abnormalities are identified.
IMPRESSION: No active disease.

## 2021-04-09 IMAGING — CT CT ABD-PELV W/ CM
2 of 4 series · 11 of 36 positions shown, 17 images · IV contrast (OMNIPAQUE 300)
Comparison: CT abdomen pelvis 10/12/2019

CLINICAL DATA: Robles Menjivar, patient had urinary stent placement last
[REDACTED], since then has had increased abdominal pain and bilateral
flank pain. Denies bleeding, urinary retention/pain. Endorses nausea
r/t pain, denies diarrhea/emesis.

EXAM:
CT ABDOMEN AND PELVIS WITH CONTRAST
TECHNIQUE: Multidetector CT imaging of the abdomen and pelvis was performed
using the standard protocol following bolus administration of
intravenous contrast.
CONTRAST:  100mL OMNIPAQUE IOHEXOL 300 MG/ML  SOLN

[Series 2: axial st · axial · 0.77mm/px · z∈[-459,-59]mm · 10 of 94 slices shown, 15 images]
[im 7/94  soft-tissue]
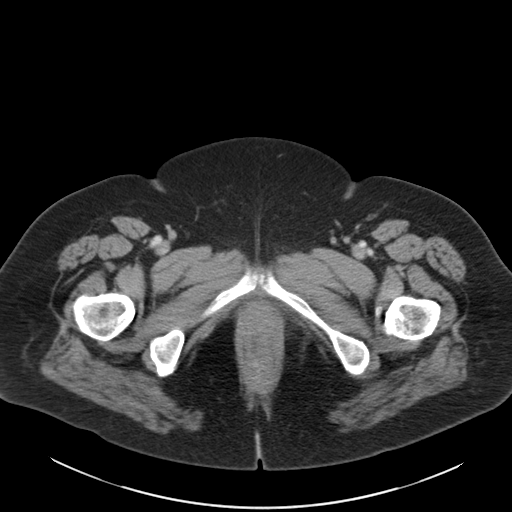
[im 7/94  bone]
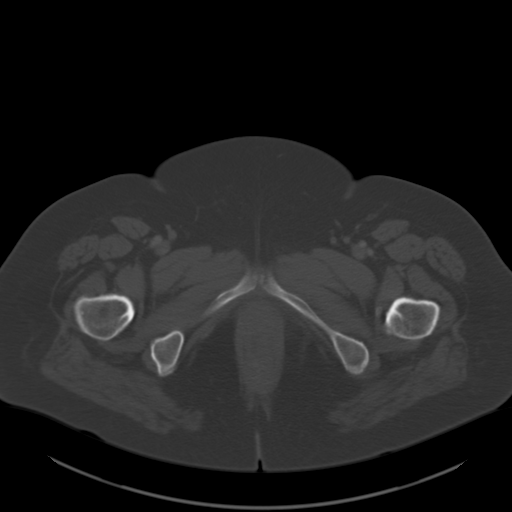
[im 19/94  soft-tissue]
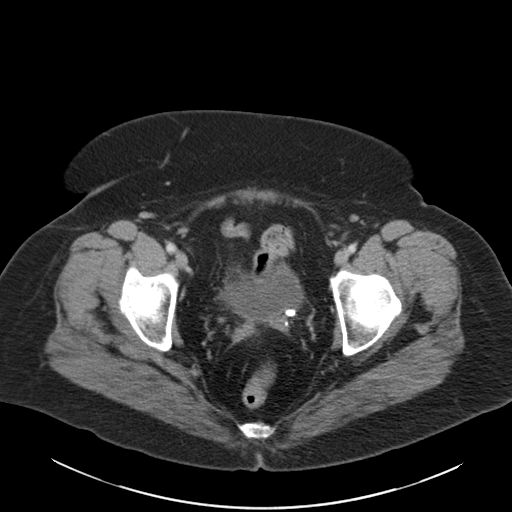
[im 25/94  soft-tissue]
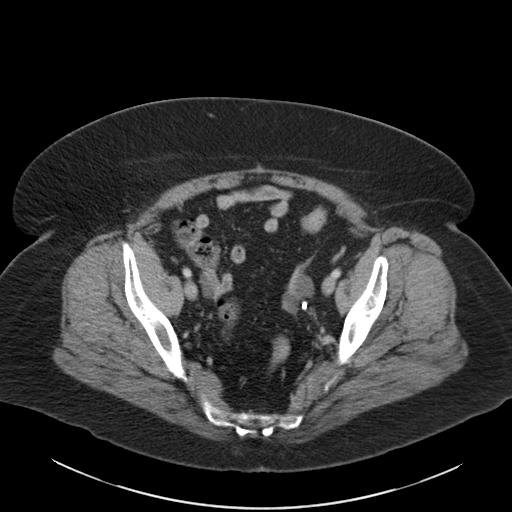
[im 38/94  soft-tissue]
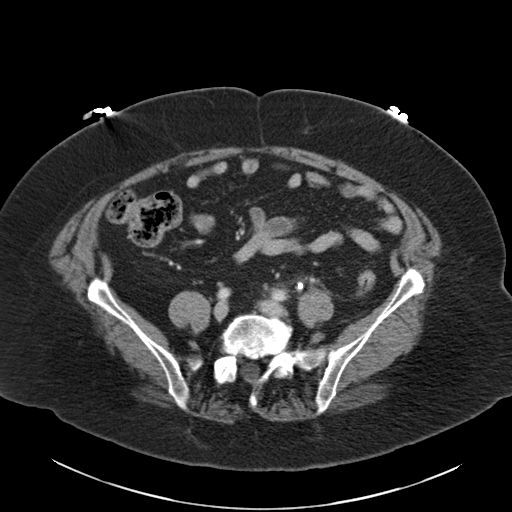
[im 50/94  soft-tissue]
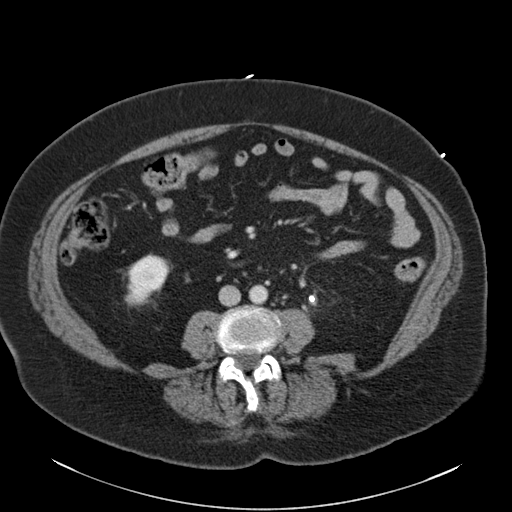
[im 56/94  soft-tissue]
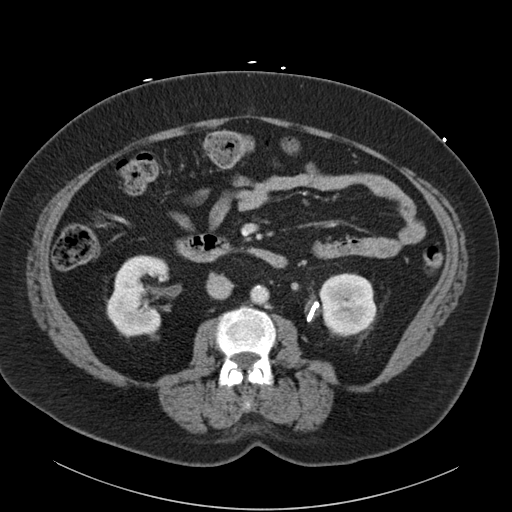
[im 69/94  soft-tissue]
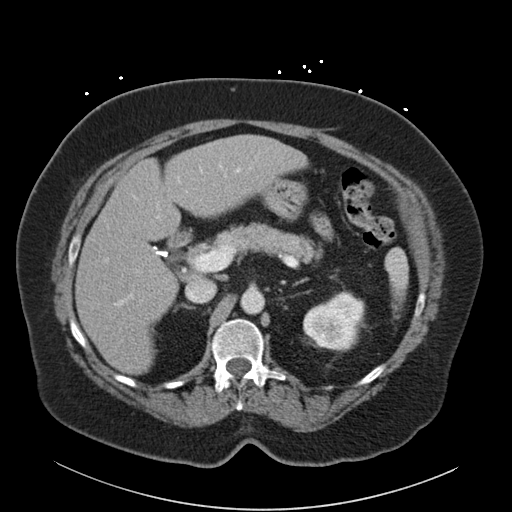
[im 69/94  lung]
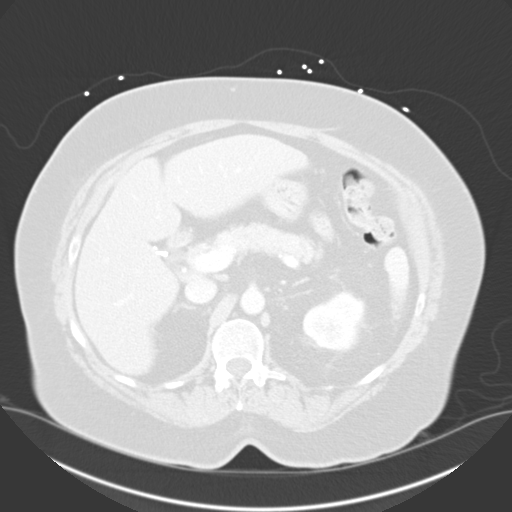
[im 75/94  soft-tissue]
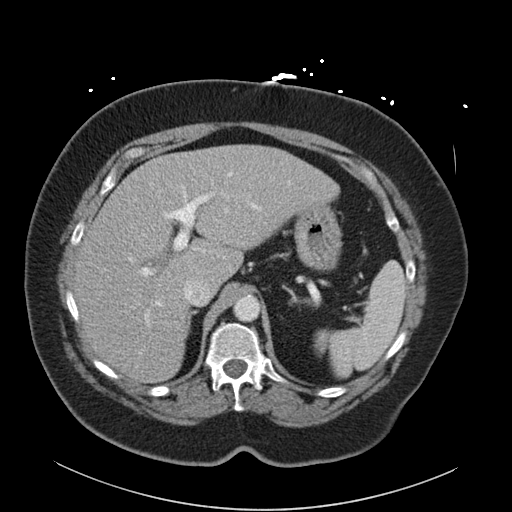
[im 75/94  lung]
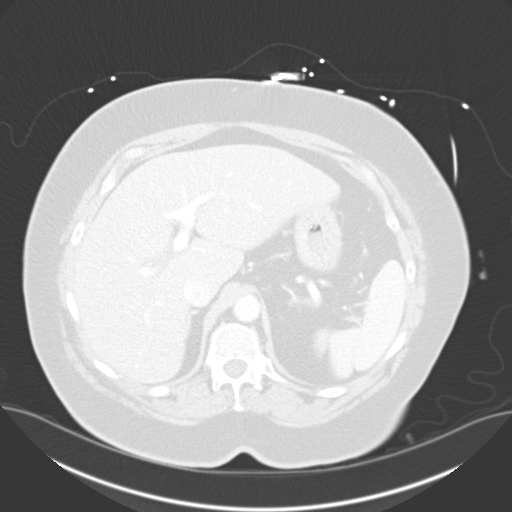
[im 81/94  lung]
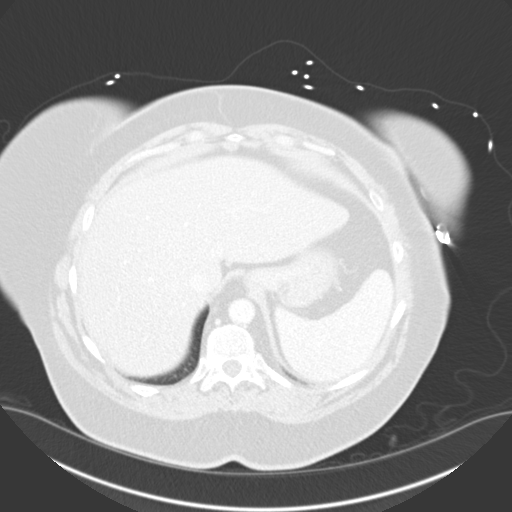
[im 87/94  soft-tissue]
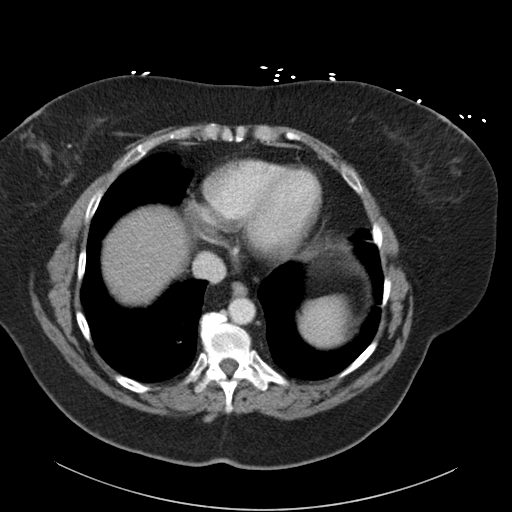
[im 87/94  lung]
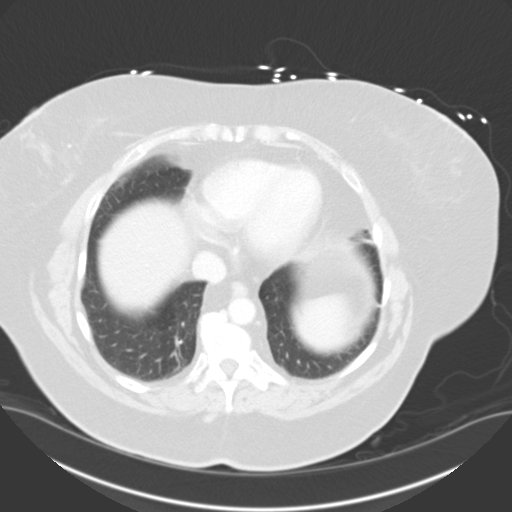
[im 87/94  bone]
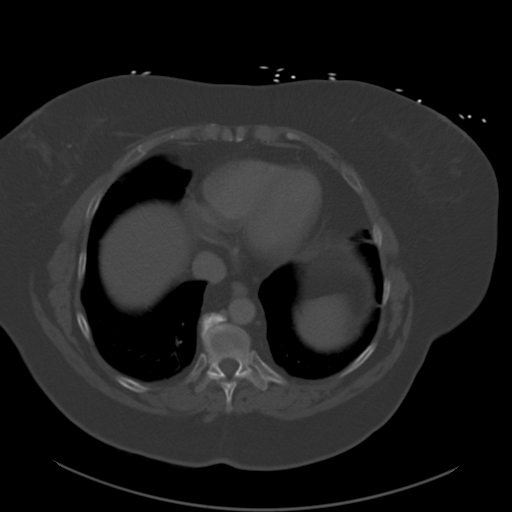

[Series 6: sagittal st · sagittal · 0.59mm/px · 1 of 176 slices shown, 2 images]
[im 59/176  soft-tissue]
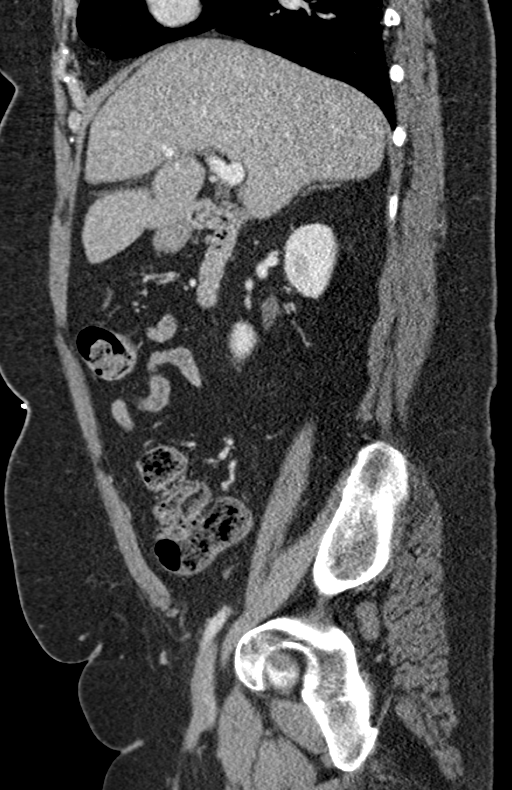
[im 59/176  bone]
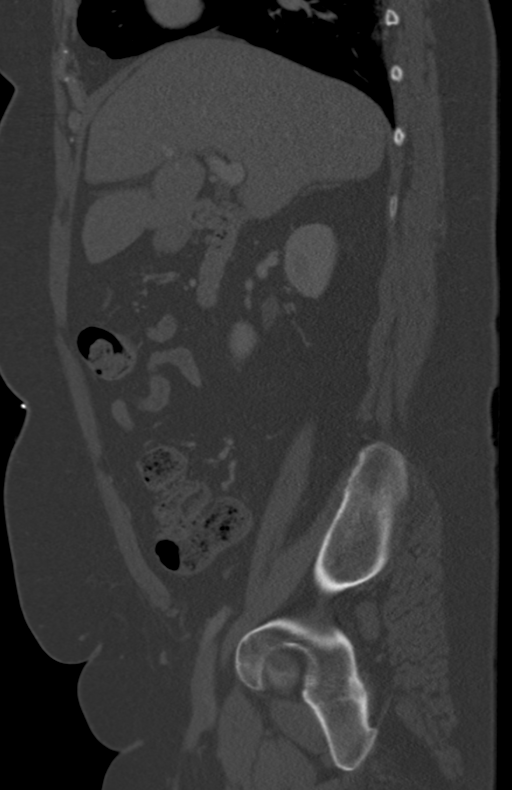

[11 of 36 positions shown; findings below may reference images not displayed]

FINDINGS: Lower chest: Mild dependent atelectasis, otherwise unremarkable.

Hepatobiliary: No focal liver abnormality is seen. Status post
cholecystectomy. No biliary dilatation.

Pancreas: Unremarkable. No pancreatic ductal dilatation or
surrounding inflammatory changes.

Spleen: Normal in size without focal abnormality.

Adrenals/Urinary Tract: Adrenal glands are unremarkable. Interval
placement of a left ureteral stent with distal and within the
urinary bladder and proximal and at the left UPJ. There is a small
amount of expected stranding about the left ureter and left kidney
secondary to recent stent placement. No perinephric hematoma or
fluid collection. There is no hydronephrosis bilaterally. No renal
masses. No renal calculi. There is a small amount of air in the
anterior bladder likely related to recent instrumentation. The
bladder is otherwise unremarkable.

Stomach/Bowel: Stomach is within normal limits. Appendix is not
clearly visualized. No evidence of bowel wall thickening,
distention, or inflammatory changes. Scattered colonic diverticula
without evidence of diverticulitis.

Vascular/Lymphatic: Aortic atherosclerosis. No aneurysm. No enlarged
abdominal or pelvic lymph nodes.

Reproductive: Status post hysterectomy. No adnexal masses.

Other: No abdominal wall hernia or abnormality. No abdominopelvic
ascites.

Musculoskeletal: No acute findings. Degenerative disc disease in the
lower lumbar spine.
IMPRESSION: 1. Interval placement of a left ureteral stent in good position. No
acute complication.
2. Colonic diverticulosis without evidence of diverticulitis.
3. Aortic atherosclerosis.

Aortic Atherosclerosis ([id number]).

## 2021-04-15 ENCOUNTER — Ambulatory Visit (INDEPENDENT_AMBULATORY_CARE_PROVIDER_SITE_OTHER): Payer: Medicare Other | Admitting: Family Medicine

## 2021-04-15 ENCOUNTER — Encounter: Payer: Self-pay | Admitting: Family Medicine

## 2021-04-15 ENCOUNTER — Other Ambulatory Visit: Payer: Self-pay

## 2021-04-15 VITALS — BP 124/80 | HR 64 | Temp 97.3°F | Ht 62.0 in | Wt 219.4 lb

## 2021-04-15 DIAGNOSIS — R600 Localized edema: Secondary | ICD-10-CM

## 2021-04-15 DIAGNOSIS — F321 Major depressive disorder, single episode, moderate: Secondary | ICD-10-CM | POA: Diagnosis not present

## 2021-04-15 DIAGNOSIS — E1142 Type 2 diabetes mellitus with diabetic polyneuropathy: Secondary | ICD-10-CM

## 2021-04-15 DIAGNOSIS — Z780 Asymptomatic menopausal state: Secondary | ICD-10-CM

## 2021-04-15 DIAGNOSIS — E039 Hypothyroidism, unspecified: Secondary | ICD-10-CM

## 2021-04-15 DIAGNOSIS — Z23 Encounter for immunization: Secondary | ICD-10-CM

## 2021-04-15 DIAGNOSIS — M797 Fibromyalgia: Secondary | ICD-10-CM | POA: Diagnosis not present

## 2021-04-15 DIAGNOSIS — Z1231 Encounter for screening mammogram for malignant neoplasm of breast: Secondary | ICD-10-CM | POA: Diagnosis not present

## 2021-04-15 LAB — BASIC METABOLIC PANEL
BUN: 20 mg/dL (ref 6–23)
CO2: 29 mEq/L (ref 19–32)
Calcium: 9.9 mg/dL (ref 8.4–10.5)
Chloride: 102 mEq/L (ref 96–112)
Creatinine, Ser: 1.06 mg/dL (ref 0.40–1.20)
GFR: 53.08 mL/min — ABNORMAL LOW (ref >60.00)
Glucose, Bld: 133 mg/dL — ABNORMAL HIGH (ref 70–99)
Potassium: 4.5 mEq/L (ref 3.5–5.1)
Sodium: 139 mEq/L (ref 135–145)

## 2021-04-15 LAB — MICROALBUMIN / CREATININE URINE RATIO
Creatinine,U: 90.2 mg/dL
Microalb Creat Ratio: 1.4 mg/g (ref 0.0–30.0)
Microalb, Ur: 1.3 mg/dL (ref 0.0–1.9)

## 2021-04-15 LAB — POCT GLYCOSYLATED HEMOGLOBIN (HGB A1C): Hemoglobin A1C: 6.4 % — AB (ref 4.0–5.6)

## 2021-04-15 LAB — TSH: TSH: 9.72 u[IU]/mL — ABNORMAL HIGH (ref 0.35–5.50)

## 2021-04-15 MED ORDER — SHINGRIX 50 MCG/0.5ML IM SUSR: 0.5000 mL | Freq: Once | INTRAMUSCULAR | 0 refills | Status: AC

## 2021-04-15 MED ORDER — HYDROCHLOROTHIAZIDE 12.5 MG PO TABS: 12.5000 mg | ORAL_TABLET | Freq: Every day | ORAL | 3 refills | Status: DC

## 2021-04-15 NOTE — Progress Notes (Signed)
Subjective  CC:  Chief Complaint  Patient presents with   Diabetes   Hypertension   Hyperlipidemia   Depression    HPI: Miranda Mathis is a 70 y.o. female who presents to the office today for follow up of diabetes and problems listed above in the chief complaint.  Diabetes follow up: Her diabetic control is reported as Unchanged.  She denies exertional CP or SOB or symptomatic hypoglycemia. She denies foot sores or paresthesias.  Remains on metformin 500 twice daily.  No symptoms of hypoglycemia.  Diabetic neuropathy is well controlled on gabapentin.  No foot sores.  Overdue for eye exam.  Eligible for flu vaccination.  Also due Pneumovax. Hypothyroidism: We adjusted up her thyroid dose in April.  Her TSH at that time was 12.  She did not follow-up for repeat check.  She feels well.  She is compliant with her medication Hypertension is controlled on lisinopril 5 mg daily.  She now is complaining of bilateral lower extremity edema, dependent in nature without shortness of breath or orthopnea or PND.  No chest pain or palpitations.  She does have a history of kidney stones Depression: Unfortunately she is stressed.  Her daughter is going through a flare with hallucinations and disrupted sleep.  She is tired from to her disrupting her sleep.  She however feels that the sertraline 100 mg daily continues to control her mood fairly well when she is not stressed. Health maintenance: She is now ready to have her mammogram and bone density done.  She is overdue for bone.  She is due for an eye exam.  Wt Readings from Last 3 Encounters:  04/15/21 219 lb 6.4 oz (99.5 kg)  10/16/20 217 lb (98.4 kg)  07/11/20 208 lb 12.8 oz (94.7 kg)    BP Readings from Last 3 Encounters:  04/15/21 124/80  10/16/20 120/74  07/11/20 116/72    Assessment  1. DM type 2 with diabetic peripheral neuropathy ([place])   2. Acquired hypothyroidism   3. Fibromyalgia   4. Depression, major, single episode, moderate ([place])    5. Need for immunization against influenza   6. Encounter for screening mammogram for breast cancer   7. Asymptomatic menopausal state   8. Lower extremity edema      Plan  Diabetes is currently well controlled.  Continue metformin 500 twice daily.  Continue gabapentin for neuropathy.  Check urine microalbuminuria given worsening edema.  Continue low-dose ACE inhibitor.  Flu shot updated today.  She will take Pneumovax next visit Hypothyroidism due for recheck.  Taking 150 mcg levothyroxine daily.  She reports she is compliant.  No symptoms of hyperthyroidism Fibromyalgia is improved. Depression controlled on sertraline 100 mg daily.  Having stress reaction due to brandies cycling mood.  Counseling done Lower extremity edema, dependent in nature.  Has mild chronic kidney disease.  Check BMP and urine today.  Start low-dose HCTZ.  May need to change to Lasix given history of kidney stones.  Patient understands Flu shot updated today.  Shingrix second dose due and prescription given.  Pneumovax next visit Mammogram and bone density, patient to schedule  Follow up: 3 months for recheck. Orders Placed This Encounter  Procedures   DG Bone Density   MM DIGITAL SCREENING BILATERAL   Flu Vaccine QUAD High Dose(Fluad)   TSH   Basic metabolic panel   Microalbumin / creatinine urine ratio   POCT HgB A1C   Meds ordered this encounter  Medications   hydrochlorothiazide (  HYDRODIURIL) 12.5 MG tablet    Sig: Take 1 tablet (12.5 mg total) by mouth daily.    Dispense:  90 tablet    Refill:  3   Zoster Vaccine Adjuvanted [facility]) injection    Sig: Inject 0.5 mLs into the muscle once for 1 dose. This is her 2nd dose    Dispense:  2 each    Refill:  0       Immunization History  Administered Date(s) Administered   Fluad Quad(high Dose 65+) 04/13/2019, 04/04/2020, 04/15/2021   Influenza Whole 03/22/2001, 03/22/2006, 03/22/2010   Influenza, High Dose Seasonal PF 02/19/2016, 06/02/2018    Influenza, Seasonal, Injecte, Preservative Fre 06/27/2012   Influenza-Unspecified 04/13/2019   Moderna Sars-Covid-2 Vaccination 08/14/2019   PFIZER(Purple Top)SARS-COV-2 Vaccination 07/17/2019, 07/24/2019, 05/08/2020   Pneumococcal Conjugate-13 11/27/2015   Pneumococcal Polysaccharide-23 11/13/2010   Tdap 11/13/2010, 09/14/2020   Zoster Recombinat (Shingrix) 04/13/2019   Zoster, Live 11/27/2015    Diabetes Related Lab Review: Lab Results  Component Value Date   HGBA1C 6.4 (A) 04/15/2021   HGBA1C 6.8 (A) 10/16/2020   HGBA1C 6.7 (A) 07/11/2020    Lab Results  Component Value Date   MICROALBUR 2.9 (H) 06/06/2019   Lab Results  Component Value Date   CREATININE 1.20 10/16/2020   BUN 25 (H) 10/16/2020   NA 140 10/16/2020   K 4.6 10/16/2020   CL 103 10/16/2020   CO2 23 10/16/2020   Lab Results  Component Value Date   CHOL 123 10/16/2020   CHOL 143 07/11/2020   CHOL 135 12/20/2019   Lab Results  Component Value Date   HDL 41.60 10/16/2020   HDL 34.80 (L) 07/11/2020   HDL 33.10 (L) 12/20/2019   Lab Results  Component Value Date   LDLCALC 66 10/16/2020   LDLCALC 86 07/11/2020   LDLCALC 80 12/20/2019   Lab Results  Component Value Date   TRIG 75.0 10/16/2020   TRIG 108.0 07/11/2020   TRIG 108.0 12/20/2019   Lab Results  Component Value Date   CHOLHDL 3 10/16/2020   CHOLHDL 4 07/11/2020   CHOLHDL 4 12/20/2019   Lab Results  Component Value Date   LDLDIRECT 165.3 11/13/2010   LDLDIRECT 143.5 08/30/2008   The ASCVD Risk score (Arnett DK, et al., 2019) failed to calculate for the following reasons:   The valid total cholesterol range is 130 to 320 mg/dL I have reviewed the PMH, Fam and Soc history. Patient Active Problem List   Diagnosis Date Noted   DM type 2 with diabetic peripheral neuropathy ([place]) 09/02/2016    Priority: 1.    Diagnosed 2019; started meds 2020; + microalbunuria on ace    Recurrent kidney stones 08/29/2015    Priority: 1.    Overview:   S/p stents, multiple, [facility], Dr. Linton Ham    GAD (generalized anxiety disorder) 08/29/2015    Priority: 1.   Acquired hypothyroidism 11/02/2013    Priority: 1.   Mixed hyperlipidemia 04/04/2012    Priority: 1.   Fibromyalgia 10/11/2007    Priority: 1.   Chronic low back pain 10/11/2007    Priority: 1.   Osteopenia after menopause 06/06/2019    Priority: 2.    dexa 2019 T = -1.9, multiple site osteopenia; recheck 2021    Bilateral hydronephrosis     Priority: 2.   Degenerative scoliosis 09/07/2018    Priority: 2.   Psoriasis 12/02/2017    Priority: 2.   History of Barrett's esophagus 08/29/2015    Priority: 2.  Last EGD 2018, no evidence of Barrett's esophagus, no further EGD recommended    GERD (gastroesophageal reflux disease) 08/29/2015    Priority: 2.   Irritable bowel syndrome with constipation 08/29/2015    Priority: 2.   Lumbosacral spondylosis with radiculopathy 08/29/2015    Priority: 2.   Primary insomnia 10/25/2014    Priority: 2.    Overview:  Last Assessment & Plan:  Insomnia is stable with current regimen of Ambien. Continue current dosage of Ambien.    Seborrheic dermatitis of scalp 12/02/2017    Priority: 3.   Allergic rhinitis 10/11/2007    Priority: 3.    Qualifier: Diagnosis of  By: Tiney Rouge CMA, Ellison Hughs      Lower extremity edema 04/15/2021   Depression, major, single episode, moderate ([place]) 07/11/2020   Barrett's esophagus with esophagitis 08/29/2015    Formatting of this note might be different from the original. Last EGD 2018, no evidence of Barrett's esophagus, no further EGD recommended     Social History: Patient  reports that she has never smoked. She has never used smokeless tobacco. She reports current alcohol use of about 1.0 standard drink per week. She reports that she does not use drugs.  Review of Systems: Ophthalmic: negative for eye pain, loss of vision or double vision Cardiovascular: negative for chest  pain Respiratory: negative for SOB or persistent cough Gastrointestinal: negative for abdominal pain Genitourinary: negative for dysuria or gross hematuria MSK: negative for foot lesions Neurologic: negative for weakness or gait disturbance  Objective  Vitals: BP 124/80   Pulse 64   Temp (!) 97.3 F (36.3 C) (Temporal)   Ht 5\' 2"  (1.575 m)   Wt 219 lb 6.4 oz (99.5 kg)   SpO2 95%   BMI 40.13 kg/m  General: well appearing, no acute distress, appears tired Psych:  Alert and oriented, normal mood and affect Cardiovascular:  Nl S1 and S2, RRR without murmur, gallop or rub.  +2 pitting edema bilateral lower extremities to mid calf, no cords Respiratory:  Good breath sounds bilaterally, CTAB with normal effort, no rales Gastrointestinal: normal BS, soft, nontender Foot exam: no erythema, pallor, or cyanosis visible nl proprioception and sensation to monofilament testing bilaterally, +2 distal pulses bilaterally    Diabetic education: ongoing education regarding chronic disease management for diabetes was given today. We continue to reinforce the ABC's of diabetic management: A1c (<7 or 8 dependent upon patient), tight blood pressure control, and cholesterol management with goal LDL < 100 minimally. We discuss diet strategies, exercise recommendations, medication options and possible side effects. At each visit, we review recommended immunizations and preventive care recommendations for diabetics and stress that good diabetic control can prevent other problems. See below for this patient's data.   Commons side effects, risks, benefits, and alternatives for medications and treatment plan prescribed today were discussed, and the patient expressed understanding of the given instructions. Patient is instructed to call or message via MyChart if he/she has any questions or concerns regarding our treatment plan. No barriers to understanding were identified. We discussed Red Flag symptoms and signs in  detail. Patient expressed understanding regarding what to do in case of urgent or emergency type symptoms.  Medication list was reconciled, printed and provided to the patient in AVS. Patient instructions and summary information was reviewed with the patient as documented in the AVS. This note was prepared with assistance of Dragon voice recognition software. Occasional wrong-word or sound-a-like substitutions may have occurred due to the inherent limitations of voice  recognition software  This visit occurred during the SARS-CoV-2 public health emergency.  Safety protocols were in place, including screening questions prior to the visit, additional usage of staff PPE, and extensive cleaning of exam room while observing appropriate contact time as indicated for disinfecting solutions.

## 2021-04-15 NOTE — Patient Instructions (Signed)
Please return in 3 months for diabetes follow up   I will release your lab results to you on your MyChart account with further instructions. Please reply with any questions.    We will try low dose hctz for your leg swelling: if you develop a kidney stone or it is not helpful, we will change it.   Got get your 2nd shingrix vaccination: i've given you a rx.  If you have any questions or concerns, please don't hesitate to send me a message via MyChart or call the office at [phone]. Thank you for visiting with Miranda Mathis today! It's our pleasure caring for you.   I have ordered a mammogram and/or bone density for you as we discussed today: [x]   Mammogram  [x]   Bone Density  Please call the office checked below to schedule your appointment:  [x]   [facility]      [address] [place], [place]         []   [facility]  [address] [place], [place]

## 2021-04-16 ENCOUNTER — Other Ambulatory Visit: Payer: Self-pay

## 2021-04-16 DIAGNOSIS — E039 Hypothyroidism, unspecified: Secondary | ICD-10-CM

## 2021-04-17 ENCOUNTER — Other Ambulatory Visit: Payer: Self-pay

## 2021-04-17 MED ORDER — LEVOTHYROXINE SODIUM 175 MCG PO TABS: 175.0000 ug | ORAL_TABLET | Freq: Every day | ORAL | 0 refills | Status: DC

## 2021-04-21 ENCOUNTER — Other Ambulatory Visit: Payer: Self-pay | Admitting: Family Medicine

## 2021-04-24 ENCOUNTER — Other Ambulatory Visit: Payer: Self-pay | Admitting: Family Medicine

## 2021-06-10 ENCOUNTER — Other Ambulatory Visit: Payer: Self-pay

## 2021-06-10 ENCOUNTER — Other Ambulatory Visit (INDEPENDENT_AMBULATORY_CARE_PROVIDER_SITE_OTHER): Payer: Medicare Other

## 2021-06-10 DIAGNOSIS — E039 Hypothyroidism, unspecified: Secondary | ICD-10-CM | POA: Diagnosis not present

## 2021-06-10 LAB — TSH: TSH: 9.6 u[IU]/mL — ABNORMAL HIGH (ref 0.35–5.50)

## 2021-06-11 NOTE — Progress Notes (Signed)
Please call patient: I have reviewed his/her lab results. Thyroid levels remains off: we increased levothyroxine from 119mcg to 146mcg 6 weeks and TSH did not move. Please verify, is she taking the new higher dose daily as directed? IF yes, then need to increase to 2102mcg daily and recheck in 8 weeks. (Around feb 21, lab visit tsh) IF no, then start new dose (175) and recheck in 8 weeks.

## 2021-06-12 ENCOUNTER — Other Ambulatory Visit: Payer: Self-pay

## 2021-06-12 DIAGNOSIS — E039 Hypothyroidism, unspecified: Secondary | ICD-10-CM

## 2021-06-12 MED ORDER — LEVOTHYROXINE SODIUM 200 MCG PO TABS: 200.0000 ug | ORAL_TABLET | Freq: Every day | ORAL | 3 refills | Status: DC

## 2021-06-20 DIAGNOSIS — M85852 Other specified disorders of bone density and structure, left thigh: Secondary | ICD-10-CM | POA: Diagnosis not present

## 2021-06-20 DIAGNOSIS — Z1231 Encounter for screening mammogram for malignant neoplasm of breast: Secondary | ICD-10-CM | POA: Diagnosis not present

## 2021-06-20 LAB — HM DEXA SCAN

## 2021-06-20 LAB — HM MAMMOGRAPHY

## 2021-06-26 ENCOUNTER — Encounter: Payer: Self-pay | Admitting: Family Medicine

## 2021-06-30 ENCOUNTER — Telehealth: Payer: Self-pay | Admitting: Pharmacist

## 2021-06-30 NOTE — Chronic Care Management (AMB) (Signed)
Chronic Care Management Pharmacy Assistant   Name: Miranda Mathis  MRN: [id number] DOB: [birth year]   Reason for Encounter: General Adherence Call    Recent office visits:  04/15/2021 [facility] (PCP) Leamon Arnt, MD; no medication changes indicated.  Recent consult visits:  02/26/2021 [facility] (Dermatology) [place], [place] R, PA-C; no medication changes indicated.  Hospital visits:  None in previous 6 months  Medications: Outpatient Encounter Medications as of 06/30/2021  Medication Sig Note   Cholecalciferol (VITAMIN D) 50 MCG (2000 UT) tablet Take 2,000 Units by mouth daily.     clobetasol (TEMOVATE) 0.05 % external solution Apply 1 application topically 2 (two) times daily. Scalp    fluconazole (DIFLUCAN) 200 MG tablet TAKE 1 TABLET BY MOUTH EVERY 3 DAYS 08/05/2020: PRN   fluocinonide-emollient (LIDEX-E) 0.05 % cream Apply on body daily not on face or folds    gabapentin (NEURONTIN) 300 MG capsule TAKE 2 CAPSULES BY MOUTH 2 TIMES DAILY.    hydrochlorothiazide (HYDRODIURIL) 12.5 MG tablet Take 1 tablet (12.5 mg total) by mouth daily.    levothyroxine (SYNTHROID) 200 MCG tablet Take 1 tablet (200 mcg total) by mouth daily.    lisinopril (ZESTRIL) 5 MG tablet TAKE 1 TABLET BY MOUTH EVERY DAY    meloxicam (MOBIC) 7.5 MG tablet TAKE 1 TABLET (7.5 MG TOTAL) BY MOUTH DAILY AS NEEDED. FOR PAIN    metFORMIN (GLUCOPHAGE) 500 MG tablet TAKE 1 TABLET BY MOUTH 2 TIMES DAILY WITH A MEAL.    omeprazole (PRILOSEC) 40 MG capsule TAKE 1 CAPSULE BY MOUTH EVERY DAY    ondansetron (ZOFRAN) 4 MG tablet Take 4 mg by mouth every 8 (eight) hours as needed for nausea or vomiting.    rosuvastatin (CRESTOR) 40 MG tablet Take 1 tablet (40 mg total) by mouth daily.    senna-docusate (SENOKOT-S) 8.6-50 MG tablet Take 2 tablets by mouth every evening.     sertraline (ZOLOFT) 100 MG tablet TAKE 1.5 TABLETS BY MOUTH DAILY    zolpidem (AMBIEN) 10 MG tablet Take 1 tablet (10 mg total) by mouth at bedtime as needed. for  sleep    [DISCONTINUED] pravastatin (PRAVACHOL) 40 MG tablet Take 1 tablet (40 mg total) by mouth daily.    No facility-administered encounter medications on file as of 06/30/2021.   Patient Questions: Have you had any problems recently with your health? Patient states her thyroid levels are off. Her levothyroxine was recently increased to 200 mcg. She is going to go back for lab work to have her TSH rechecked on 08/12/2021.  Have you had any problems with your pharmacy? She denies having any problems with her pharmacy.  What issues or side effects are you having with your medications? Patient states Metformin makes her feel nauseous sometimes. She states she does take this medication with a meal.  What would you like me to pass along to Leata Mouse, CPP for him to help you with?  Patient states she does not have anything for me to pass along.  What can we do to take care of you better? Patient did not have any suggestions.  Care Gaps: Medicare Annual Wellness: Scheduled 08/12/2021 Ophthalmology Exam: Overdue since 07/12/2020 Foot Exam: Overdue since 04/04/2021 Hemoglobin A1C: 6.4% on 04/15/2021 Colonoscopy: Next due on 05/02/2023 Dexa Scan: Next due on 06/21/2023 Mammogram: Next due on 06/20/2022  Future Appointments  Date Time Provider [place]  07/16/2021 10:30 AM Leamon Arnt, MD [facility]  08/11/2021 11:00 AM [facility] HEALTH COACH [facility]  08/12/2021 10:00 AM [facility] LAB [facility]    Star Rating Drugs: Lisinopril 5 mg last filled 05/11/2021 90 DS Rosuvastatin 40 mg last filled 04/25/2021 90 DS Metformin 500 mg last filled 05/28/2021 90 DS  April D Calhoun, [place] Pharmacist Assistant [phone]

## 2021-07-07 ENCOUNTER — Other Ambulatory Visit: Payer: Self-pay | Admitting: Family Medicine

## 2021-07-08 ENCOUNTER — Other Ambulatory Visit: Payer: Self-pay

## 2021-07-08 MED ORDER — MELOXICAM 7.5 MG PO TABS: 7.5000 mg | ORAL_TABLET | Freq: Every day | ORAL | 3 refills | Status: DC | PRN

## 2021-07-10 ENCOUNTER — Other Ambulatory Visit: Payer: Self-pay | Admitting: Family Medicine

## 2021-07-16 ENCOUNTER — Other Ambulatory Visit: Payer: Self-pay

## 2021-07-16 ENCOUNTER — Encounter: Payer: Self-pay | Admitting: Family Medicine

## 2021-07-16 ENCOUNTER — Ambulatory Visit (INDEPENDENT_AMBULATORY_CARE_PROVIDER_SITE_OTHER): Payer: Medicare Other | Admitting: Family Medicine

## 2021-07-16 VITALS — BP 124/70 | HR 72 | Temp 97.4°F | Ht 63.0 in | Wt 223.8 lb

## 2021-07-16 DIAGNOSIS — M8580 Other specified disorders of bone density and structure, unspecified site: Secondary | ICD-10-CM

## 2021-07-16 DIAGNOSIS — M797 Fibromyalgia: Secondary | ICD-10-CM

## 2021-07-16 DIAGNOSIS — Z780 Asymptomatic menopausal state: Secondary | ICD-10-CM | POA: Diagnosis not present

## 2021-07-16 DIAGNOSIS — E1142 Type 2 diabetes mellitus with diabetic polyneuropathy: Secondary | ICD-10-CM

## 2021-07-16 DIAGNOSIS — E039 Hypothyroidism, unspecified: Secondary | ICD-10-CM

## 2021-07-16 DIAGNOSIS — Z23 Encounter for immunization: Secondary | ICD-10-CM | POA: Diagnosis not present

## 2021-07-16 DIAGNOSIS — F411 Generalized anxiety disorder: Secondary | ICD-10-CM

## 2021-07-16 DIAGNOSIS — R600 Localized edema: Secondary | ICD-10-CM

## 2021-07-16 DIAGNOSIS — F321 Major depressive disorder, single episode, moderate: Secondary | ICD-10-CM | POA: Diagnosis not present

## 2021-07-16 DIAGNOSIS — E782 Mixed hyperlipidemia: Secondary | ICD-10-CM

## 2021-07-16 LAB — POCT GLYCOSYLATED HEMOGLOBIN (HGB A1C): Hemoglobin A1C: 7.3 % — AB (ref 4.0–5.6)

## 2021-07-16 LAB — CBC WITH DIFFERENTIAL/PLATELET
Basophils Absolute: 0 10*3/uL (ref 0.0–0.1)
Basophils Relative: 0.5 % (ref 0.0–3.0)
Eosinophils Absolute: 0.2 10*3/uL (ref 0.0–0.7)
Eosinophils Relative: 4.4 % (ref 0.0–5.0)
HCT: 38.4 % (ref 36.0–46.0)
Hemoglobin: 12.2 g/dL (ref 12.0–15.0)
Lymphocytes Relative: 19.2 % (ref 12.0–46.0)
Lymphs Abs: 0.9 10*3/uL (ref 0.7–4.0)
MCHC: 31.9 g/dL (ref 30.0–36.0)
MCV: 84.4 fl (ref 78.0–100.0)
Monocytes Absolute: 0.4 10*3/uL (ref 0.1–1.0)
Monocytes Relative: 9.1 % (ref 3.0–12.0)
Neutro Abs: 3 10*3/uL (ref 1.4–7.7)
Neutrophils Relative %: 66.8 % (ref 43.0–77.0)
Platelets: 167 10*3/uL (ref 150.0–400.0)
RBC: 4.55 Mil/uL (ref 3.87–5.11)
RDW: 16.4 % — ABNORMAL HIGH (ref 11.5–15.5)
WBC: 4.6 10*3/uL (ref 4.0–10.5)

## 2021-07-16 LAB — COMPREHENSIVE METABOLIC PANEL
ALT: 38 U/L — ABNORMAL HIGH (ref 0–35)
AST: 50 U/L — ABNORMAL HIGH (ref 0–37)
Albumin: 4.1 g/dL (ref 3.5–5.2)
Alkaline Phosphatase: 51 U/L (ref 39–117)
BUN: 21 mg/dL (ref 6–23)
CO2: 29 mEq/L (ref 19–32)
Calcium: 9.5 mg/dL (ref 8.4–10.5)
Chloride: 102 mEq/L (ref 96–112)
Creatinine, Ser: 1.03 mg/dL (ref 0.40–1.20)
GFR: 54.85 mL/min — ABNORMAL LOW (ref >60.00)
Glucose, Bld: 130 mg/dL — ABNORMAL HIGH (ref 70–99)
Potassium: 4.8 mEq/L (ref 3.5–5.1)
Sodium: 139 mEq/L (ref 135–145)
Total Bilirubin: 0.4 mg/dL (ref 0.2–1.2)
Total Protein: 6.9 g/dL (ref 6.0–8.3)

## 2021-07-16 LAB — LIPID PANEL
Cholesterol: 122 mg/dL (ref 0–200)
HDL: 36.7 mg/dL — ABNORMAL LOW (ref >39.00)
LDL Cholesterol: 64 mg/dL (ref 0–99)
NonHDL: 85.61
Total CHOL/HDL Ratio: 3
Triglycerides: 108 mg/dL (ref 0.0–149.0)
VLDL: 21.6 mg/dL (ref 0.0–40.0)

## 2021-07-16 LAB — TSH: TSH: 2.14 u[IU]/mL (ref 0.35–5.50)

## 2021-07-16 MED ORDER — SERTRALINE HCL 100 MG PO TABS: 200.0000 mg | ORAL_TABLET | Freq: Every day | ORAL | 3 refills | Status: DC

## 2021-07-16 MED ORDER — METFORMIN HCL 1000 MG PO TABS: 1000.0000 mg | ORAL_TABLET | Freq: Two times a day (BID) | ORAL | 3 refills | Status: DC

## 2021-07-16 NOTE — Progress Notes (Signed)
Subjective  CC:  Chief Complaint  Patient presents with   Diabetes    HPI: Miranda Mathis is a 71 y.o. female who presents to the office today for follow up of diabetes and problems listed above in the chief complaint.  Diabetes follow up: Her diabetic control is reported as Worse.  However, she reports her diet is stable.  Suffering from lots of stressors at home.  Thinks this could be related to higher sugars.  She is tolerating metformin 500 twice daily.She denies exertional CP or SOB or symptomatic hypoglycemia. She denies foot sores and mild paresthesias are fairly well controlled on gabapentin.  She tolerates low-dose ACE inhibitor for renal protection. Hypothyroidism: We adjusted up her thyroid medication to 200 mcg daily.  Due for recheck.  She denies symptoms of hyperthyroidism.  She is compliant with her medications. Hyperlipidemia: Stable on rosuvastatin 40 mg daily.  Tolerates well.  Nonfasting for recheck today. Depression anxiety are very active.  Continues to be stressed by difficulties with her daughter's mental health.  Having auditory hallucinations.  Patient feels down.  She is on sertraline 150 mg daily.  This is worked well for her previously.  She has not tolerated Wellbutrin in the past.  No suicidal ideation sleep is fair  Depression screen [facility] 2/9 07/16/2021 04/15/2021 12/02/2020 08/05/2020 04/08/2020  Decreased Interest 3 2 2  0 2  Down, Depressed, Hopeless 3 3 2 1 3   PHQ - 2 Score 6 5 4 1 5   Altered sleeping 2 3 0 - 3  Tired, decreased energy 3 3 3  - -  Change in appetite 0 2 1 - 1  Feeling bad or failure about yourself  2 0 2 - 1  Trouble concentrating 2 3 3  - 0  Moving slowly or fidgety/restless 0 0 0 - 0  Suicidal thoughts 0 0 0 - 0  PHQ-9 Score 15 16 13  - 10  Difficult doing work/chores Very difficult Somewhat difficult - - Somewhat difficult  Some recent data might be hidden   Fibromyalgia stable. History of hydronephrosis status post stenting: Stable.   Urology Lower extremity edema is not a current problem. Health maintenance: Eligible for Pneumovax 23, needs a diabetic eye exam  Wt Readings from Last 3 Encounters:  07/16/21 223 lb 12.8 oz (101.5 kg)  04/15/21 219 lb 6.4 oz (99.5 kg)  10/16/20 217 lb (98.4 kg)    BP Readings from Last 3 Encounters:  07/16/21 124/70  04/15/21 124/80  10/16/20 120/74    Assessment  1. DM type 2 with diabetic peripheral neuropathy ([place])   2. Acquired hypothyroidism   3. Fibromyalgia   4. GAD (generalized anxiety disorder)   5. Lower extremity edema   6. Mixed hyperlipidemia   7. Depression, major, single episode, moderate ([place])   8. Osteopenia after menopause      Plan  Diabetes is currently marginally controlled.  We will increase metformin to 1000 twice daily.  Recheck 3 months.  Continue diabetic diet.  Patient to schedule eye exam.  Pneumovax updated today. Hypothyroidism: Recheck levels today on 200 mcg of levothyroxine daily.  This was increased about 3 months ago. Chronic fibromyalgia and anxiety disorder fairly stable, however depression very active.  Increase sertraline to 150 mg daily.  Recheck 4 months also recommend psychotherapy.  Brochure given to patient to schedule Extremity edema: Just monitor for now Recheck lipids on Crestor 40 daily Stable osteopenia, continue weightbearing exercises, vitamin D and calcium  I spent a total of  52 minutes for this patient encounter. Time spent included preparation, face-to-face counseling with the patient and coordination of care, review of chart and records, and documentation of the encounter.   Follow up: Return in about 3 months (around 10/14/2021) for mood follow up, follow up Diabetes.. Orders Placed This Encounter  Procedures   Pneumococcal polysaccharide vaccine 23-valent greater than or equal to 2yo subcutaneous/IM   TSH   Lipid panel   Comprehensive metabolic panel   CBC with Differential/Platelet   POCT HgB A1C   Meds ordered  this encounter  Medications   metFORMIN (GLUCOPHAGE) 1000 MG tablet    Sig: Take 1 tablet (1,000 mg total) by mouth 2 (two) times daily with a meal.    Dispense:  180 tablet    Refill:  3    Increasing dose from 500 bid. thanks   sertraline (ZOLOFT) 100 MG tablet    Sig: Take 2 tablets (200 mg total) by mouth daily.    Dispense:  90 tablet    Refill:  3    Increasing dose from 150mg  daily. thanks      Immunization History  Administered Date(s) Administered   Fluad Quad(high Dose 65+) 04/13/2019, 04/04/2020, 04/15/2021   Influenza Whole 03/22/2001, 03/22/2006, 03/22/2010   Influenza, High Dose Seasonal PF 02/19/2016, 06/02/2018   Influenza, Seasonal, Injecte, Preservative Fre 06/27/2012   Influenza-Unspecified 04/13/2019   Moderna Sars-Covid-2 Vaccination 08/14/2019   PFIZER(Purple Top)SARS-COV-2 Vaccination 07/17/2019, 07/24/2019, 05/08/2020   Pneumococcal Conjugate-13 11/27/2015   Pneumococcal Polysaccharide-23 11/13/2010, 07/16/2021   Tdap 11/13/2010, 09/14/2020   Zoster Recombinat (Shingrix) 04/13/2019   Zoster, Live 11/27/2015    Diabetes Related Lab Review: Lab Results  Component Value Date   HGBA1C 7.3 (A) 07/16/2021   HGBA1C 6.4 (A) 04/15/2021   HGBA1C 6.8 (A) 10/16/2020    Lab Results  Component Value Date   MICROALBUR 1.3 04/15/2021   Lab Results  Component Value Date   CREATININE 1.06 04/15/2021   BUN 20 04/15/2021   NA 139 04/15/2021   K 4.5 04/15/2021   CL 102 04/15/2021   CO2 29 04/15/2021   Lab Results  Component Value Date   CHOL 123 10/16/2020   CHOL 143 07/11/2020   CHOL 135 12/20/2019   Lab Results  Component Value Date   HDL 41.60 10/16/2020   HDL 34.80 (L) 07/11/2020   HDL 33.10 (L) 12/20/2019   Lab Results  Component Value Date   LDLCALC 66 10/16/2020   LDLCALC 86 07/11/2020   LDLCALC 80 12/20/2019   Lab Results  Component Value Date   TRIG 75.0 10/16/2020   TRIG 108.0 07/11/2020   TRIG 108.0 12/20/2019   Lab Results   Component Value Date   CHOLHDL 3 10/16/2020   CHOLHDL 4 07/11/2020   CHOLHDL 4 12/20/2019   Lab Results  Component Value Date   LDLDIRECT 165.3 11/13/2010   LDLDIRECT 143.5 08/30/2008   The ASCVD Risk score (Arnett DK, et al., 2019) failed to calculate for the following reasons:   The valid total cholesterol range is 130 to 320 mg/dL I have reviewed the PMH, Fam and Soc history. Patient Active Problem List   Diagnosis Date Noted   Depression, major, single episode, moderate ([place]) 07/11/2020    Priority: High   DM type 2 with diabetic peripheral neuropathy ([place]) 09/02/2016    Priority: High    Diagnosed 2019; started meds 2020; + microalbunuria on ace    Recurrent kidney stones 08/29/2015    Priority: High  Overview:  S/p stents, multiple, [facility], Dr. Linton Ham    GAD (generalized anxiety disorder) 08/29/2015    Priority: High   Acquired hypothyroidism 11/02/2013    Priority: High   Mixed hyperlipidemia 04/04/2012    Priority: High   Fibromyalgia 10/11/2007    Priority: High   Chronic low back pain 10/11/2007    Priority: High   Osteopenia after menopause 06/06/2019    Priority: Medium     dexa 2019 T = -1.9, multiple site osteopenia;  DEXA December 2022, osteopenia    Bilateral hydronephrosis     Priority: Medium    Degenerative scoliosis 09/07/2018    Priority: Medium    Psoriasis 12/02/2017    Priority: Medium    History of Barrett's esophagus 08/29/2015    Priority: Medium     Last EGD 2018, no evidence of Barrett's esophagus, no further EGD recommended    GERD (gastroesophageal reflux disease) 08/29/2015    Priority: Medium    Irritable bowel syndrome with constipation 08/29/2015    Priority: Medium    Lumbosacral spondylosis with radiculopathy 08/29/2015    Priority: Medium    Primary insomnia 10/25/2014    Priority: Medium     Overview:  Last Assessment & Plan:  Insomnia is stable with current regimen of Ambien. Continue current dosage of  Ambien.    Seborrheic dermatitis of scalp 12/02/2017    Priority: Low   Allergic rhinitis 10/11/2007    Priority: Low    Qualifier: Diagnosis of  By: Tiney Rouge CMA, Ellison Hughs      Lower extremity edema 04/15/2021   Barrett's esophagus with esophagitis 08/29/2015    Formatting of this note might be different from the original. Last EGD 2018, no evidence of Barrett's esophagus, no further EGD recommended     Social History: Patient  reports that she has never smoked. She has never used smokeless tobacco. She reports current alcohol use of about 1.0 standard drink per week. She reports that she does not use drugs.  Review of Systems: Ophthalmic: negative for eye pain, loss of vision or double vision Cardiovascular: negative for chest pain Respiratory: negative for SOB or persistent cough Gastrointestinal: negative for abdominal pain Genitourinary: negative for dysuria or gross hematuria MSK: negative for foot lesions Neurologic: negative for weakness or gait disturbance  Objective  Vitals: BP 124/70    Pulse 72    Temp (!) 97.4 F (36.3 C) (Temporal)    Ht 5\' 3"  (1.6 m)    Wt 223 lb 12.8 oz (101.5 kg)    SpO2 96%    BMI 39.64 kg/m  General: well appearing, no acute distress  Psych:  Alert and oriented, flat mood and affect HEENT:  Normocephalic, atraumatic, moist mucous membranes, supple neck  Cardiovascular:  Nl S1 and S2, RRR without murmur, gallop or rub. no edema Respiratory:  Good breath sounds bilaterally, CTAB with normal effort, no rales Gastrointestinal: normal BS, soft, nontender Skin:  Warm, no rashes Neurologic:   Mental status is normal. normal gait Foot exam: no erythema, pallor, or cyanosis visible nl proprioception and slight decrease in sensation to monofilament testing bilaterally, +2 distal pulses bilaterally    Diabetic education: ongoing education regarding chronic disease management for diabetes was given today. We continue to reinforce the ABC's of diabetic  management: A1c (<7 or 8 dependent upon patient), tight blood pressure control, and cholesterol management with goal LDL < 100 minimally. We discuss diet strategies, exercise recommendations, medication options and possible side effects. At each visit,  we review recommended immunizations and preventive care recommendations for diabetics and stress that good diabetic control can prevent other problems. See below for this patient's data.   Commons side effects, risks, benefits, and alternatives for medications and treatment plan prescribed today were discussed, and the patient expressed understanding of the given instructions. Patient is instructed to call or message via MyChart if he/she has any questions or concerns regarding our treatment plan. No barriers to understanding were identified. We discussed Red Flag symptoms and signs in detail. Patient expressed understanding regarding what to do in case of urgent or emergency type symptoms.  Medication list was reconciled, printed and provided to the patient in AVS. Patient instructions and summary information was reviewed with the patient as documented in the AVS. This note was prepared with assistance of Dragon voice recognition software. Occasional wrong-word or sound-a-like substitutions may have occurred due to the inherent limitations of voice recognition software  This visit occurred during the SARS-CoV-2 public health emergency.  Safety protocols were in place, including screening questions prior to the visit, additional usage of staff PPE, and extensive cleaning of exam room while observing appropriate contact time as indicated for disinfecting solutions.

## 2021-07-16 NOTE — Patient Instructions (Signed)
Please return in 3 months for diabetes follow up   I have increased the dose of your metformin and sertraline to help your diabetes and stress/mood.   I will release your lab results to you on your MyChart account with further instructions. Please reply with any questions.    Please call [place] [facility] to schedule an appointment with a therapist; The phone number is: [phone]  If you have any questions or concerns, please don't hesitate to send me a message via MyChart or call the office at [phone]. Thank you for visiting with [name] today! It's our pleasure caring for you.   Stress, Adult Stress is a normal reaction to life events. Stress is what you feel when life demands more than you are used to, or more than you think you can handle. Some stress can be useful, such as studying for a test or meeting a deadline at work. Stress that occurs too often or for too long can cause problems. Long-lasting stress is called chronic stress. Chronic stress can affect your emotional health and interfere with relationships and normal daily activities. Too much stress can weaken your body's defense system (immune system) and increase your risk for physical illness. If you already have a medical problem, stress can make it worse. What are the causes? All sorts of life events can cause stress. An event that causes stress for one person may not be stressful for someone else. Major life events, whether positive or negative, commonly cause stress. Examples include: Losing a job or starting a new job. Losing a loved one. Moving to a new town or home. Getting married or divorced. Having a baby. Getting injured or sick. Less obvious life events can also cause stress, especially if they occur day after day or in combination with each other. Examples include: Working long hours. Driving in traffic. Caring for children. Being in debt. Being in a difficult relationship. What are the signs  or symptoms? Stress can cause emotional and physical symptoms and can lead to unhealthy behaviors. These include the following: Emotional symptoms Anxiety. This is feeling worried, afraid, on edge, overwhelmed, or out of control. Anger, including irritation or impatience. Depression. This is feeling sad, down, helpless, or guilty. Trouble focusing, remembering, or making decisions. Physical symptoms Aches and pains. These may affect your head, neck, back, stomach, or other areas of your body. Tight muscles or a clenched jaw. Low energy. Trouble sleeping. Unhealthy behaviors Eating to feel better (overeating) or skipping meals. Working too much or putting off tasks. Smoking, drinking alcohol, or using drugs to feel better. How is this diagnosed? A stress disorder is diagnosed through an assessment by your health care provider. A stress disorder may be diagnosed based on: Your symptoms and any stressful life events. Your medical history. Tests to rule out other causes of your symptoms. Depending on your condition, your health care provider may refer you to a specialist for further evaluation. How is this treated? Stress management techniques are the recommended treatment for stress. Medicine is not typically recommended for treating stress. Techniques to reduce your reaction to stressful life events include: Identifying stress. Monitor yourself for symptoms of stress and notice what causes stress for you. These skills may help you to avoid or prepare for stressful events. Managing time. Set your priorities, keep a calendar of events, and learn to say no. These actions can help you avoid taking on too much. Techniques for dealing with stress include: Rethinking the problem. Try to think  realistically about stressful events rather than ignoring them or overreacting. Try to find the positives in a stressful situation rather than focusing on the negatives. Exercise. Physical exercise can  release both physical and emotional tension. The key is to find a form of exercise that you enjoy and do it regularly. Relaxation techniques. These relax the body and mind. Find one or more that you enjoy and use the techniques regularly. Examples include: Meditation, deep breathing, or progressive relaxation techniques. Yoga or tai chi. Biofeedback, mindfulness techniques, or journaling. Listening to music, being in nature, or taking part in other hobbies. Practicing a healthy lifestyle. Eat a balanced diet, drink plenty of water, limit or avoid caffeine, and get plenty of sleep. Having a strong support network. Spend time with family, friends, or other people you enjoy being around. Express your feelings and talk things over with someone you trust. Counseling or talk therapy with a mental health provider may help if you are having trouble managing stress by yourself. Follow these instructions at home: Lifestyle  Avoid drugs. Do not use any products that contain nicotine or tobacco. These products include cigarettes, chewing tobacco, and vaping devices, such as e-cigarettes. If you need help quitting, ask your health care provider. If you drink alcohol: Limit how much you have to: 0-1 drink a day for women who are not pregnant. 0-2 drinks a day for men. Know how much alcohol is in a drink. In the [place] one drink equals one 12 oz bottle of beer (355 mL), one 5 oz glass of wine (148 mL), or one 1 oz glass of hard liquor (44 mL). Do not use alcohol or drugs to relax. Eat a balanced diet that includes fresh fruits and vegetables, whole grains, lean meats, fish, eggs, beans, and low-fat dairy. Avoid processed foods and foods high in added fat, sugar, and salt. Exercise at least 30 minutes on 5 or more days each week. Get 7-8 hours of sleep each night. General instructions  Practice stress management techniques as told by your health care provider. Drink enough fluid to keep your urine pale  yellow. Take over-the-counter and prescription medicines only as told by your health care provider. Keep all follow-up visits. This is important. Contact a health care provider if: Your symptoms get worse. You have new symptoms. You feel overwhelmed by your problems and can no longer manage them by yourself. Get help right away if: You have thoughts of hurting yourself or others. Get help right awayif you feel like you may hurt yourself or others, or have thoughts about taking your own life. Go to your nearest emergency room or: Call 911. Call the [place] at [phone] or 988. This is open 24 hours a day. Text the Crisis Text Line at (828)155-0956. Summary Stress is a normal reaction to life events. It can cause problems if it happens too often or for too long. Practicing stress management techniques is the best way to treat stress. Counseling or talk therapy with a mental health provider may help if you are having trouble managing stress by yourself. This information is not intended to replace advice given to you by your health care provider. Make sure you discuss any questions you have with your health care provider. Document Revised: 01/16/2021 Document Reviewed: 01/16/2021 Elsevier Patient Education  [place].

## 2021-07-22 ENCOUNTER — Other Ambulatory Visit: Payer: Self-pay | Admitting: Family Medicine

## 2021-07-22 DIAGNOSIS — H2513 Age-related nuclear cataract, bilateral: Secondary | ICD-10-CM | POA: Diagnosis not present

## 2021-07-22 DIAGNOSIS — E119 Type 2 diabetes mellitus without complications: Secondary | ICD-10-CM | POA: Diagnosis not present

## 2021-07-22 DIAGNOSIS — H04123 Dry eye syndrome of bilateral lacrimal glands: Secondary | ICD-10-CM | POA: Diagnosis not present

## 2021-07-23 ENCOUNTER — Other Ambulatory Visit: Payer: Self-pay | Admitting: Family Medicine

## 2021-07-25 ENCOUNTER — Encounter: Payer: Self-pay | Admitting: Family Medicine

## 2021-08-08 ENCOUNTER — Other Ambulatory Visit: Payer: Self-pay | Admitting: Family Medicine

## 2021-08-11 ENCOUNTER — Ambulatory Visit: Payer: Medicare Other

## 2021-08-12 ENCOUNTER — Other Ambulatory Visit: Payer: Medicare Other

## 2021-08-24 ENCOUNTER — Other Ambulatory Visit: Payer: Self-pay | Admitting: Family Medicine

## 2021-09-02 ENCOUNTER — Ambulatory Visit (INDEPENDENT_AMBULATORY_CARE_PROVIDER_SITE_OTHER): Payer: Medicare Other | Admitting: Family Medicine

## 2021-09-02 ENCOUNTER — Encounter: Payer: Self-pay | Admitting: Family Medicine

## 2021-09-02 VITALS — BP 132/76 | HR 72 | Temp 97.2°F | Ht 63.0 in | Wt 217.2 lb

## 2021-09-02 DIAGNOSIS — G8929 Other chronic pain: Secondary | ICD-10-CM | POA: Diagnosis not present

## 2021-09-02 DIAGNOSIS — M4150 Other secondary scoliosis, site unspecified: Secondary | ICD-10-CM | POA: Diagnosis not present

## 2021-09-02 DIAGNOSIS — S93402A Sprain of unspecified ligament of left ankle, initial encounter: Secondary | ICD-10-CM | POA: Diagnosis not present

## 2021-09-02 DIAGNOSIS — M5441 Lumbago with sciatica, right side: Secondary | ICD-10-CM | POA: Diagnosis not present

## 2021-09-02 MED ORDER — TRAMADOL HCL 50 MG PO TABS: 50.0000 mg | ORAL_TABLET | Freq: Three times a day (TID) | ORAL | 0 refills | Status: AC | PRN

## 2021-09-02 MED ORDER — PREDNISONE 10 MG PO TABS: ORAL_TABLET | ORAL | 0 refills | Status: DC

## 2021-09-02 MED ORDER — CYCLOBENZAPRINE HCL 10 MG PO TABS: 10.0000 mg | ORAL_TABLET | Freq: Every evening | ORAL | 0 refills | Status: DC | PRN

## 2021-09-02 NOTE — Patient Instructions (Signed)
Please follow up as scheduled for your next visit with me: 10/28/2021  ? ?If you have any questions or concerns, please don't hesitate to send me a message via MyChart or call the office at [phone]. Thank you for visiting with [name] today! It's our pleasure caring for you.  ? ? ?

## 2021-09-02 NOTE — Progress Notes (Signed)
Subjective  CC:  Chief Complaint  Patient presents with   Back Pain    Lower; Pt states that it started over the weekend. She has taken OTC Tylenol, but says it has not touched the pain.   Ankle Pain    Started 2 weeks ago.     HPI: Miranda Mathis is a 71 y.o. female who presents to the office today to address the problems listed above in the chief complaint. 71 year old female with chronic low back pain due to arthritic changes, history of herniated disc, history of discectomy and secondary scoliosis: Reports a history of 2 days of acute pain.  Pain with prolonged standing or sitting without lumbar support.  She can live without difficulty.  Thinks she exacerbated her low back problem with bending forward over the laundry machine and dryer.  She does complain of right radicular symptoms without weakness.  No bowel or bladder problems.  Tylenol has not been helpful.  She has been responsive to steroids in the past.  I reviewed her last MRI which was several years ago. Also noted 2 to 3 weeks of left ankle pain, lateral.  She denies being aware of an injury.  But she is active and is unsure.  Was limping but now walking better but still painful.  No midfoot pain.  No calf pain or swelling.  Assessment  1. Chronic midline low back pain with right-sided sciatica   2. Degenerative scoliosis   3. Sprain of left ankle, unspecified ligament, initial encounter      Plan  Acute on chronic low back pain with sciatica: Prednisone, muscle relaxer and tramadol.  Gentle range of motion exercises and follow-up if not improving. Subacute left ankle sprain: Mild.  Education given.  Recommend figure-of-eight ankle support.  Follow up: Return in about 6 months (around 03/05/2022) for complete physical.  10/28/2021  No orders of the defined types were placed in this encounter.  Meds ordered this encounter  Medications   traMADol (ULTRAM) 50 MG tablet    Sig: Take 1 tablet (50 mg total) by mouth every 8  (eight) hours as needed for up to 5 days.    Dispense:  15 tablet    Refill:  0   cyclobenzaprine (FLEXERIL) 10 MG tablet    Sig: Take 1 tablet (10 mg total) by mouth at bedtime as needed for muscle spasms.    Dispense:  30 tablet    Refill:  0   predniSONE (DELTASONE) 10 MG tablet    Sig: Take 4 tabs qd x 2 days, 3 qd x 2 days, 2 qd x 2d, 1qd x 3 days    Dispense:  21 tablet    Refill:  0      I reviewed the patients updated PMH, FH, and SocHx.    Patient Active Problem List   Diagnosis Date Noted   Depression, major, single episode, moderate (HCC) 07/11/2020    Priority: High   DM type 2 with diabetic peripheral neuropathy (HCC) 09/02/2016    Priority: High   Recurrent kidney stones 08/29/2015    Priority: High   GAD (generalized anxiety disorder) 08/29/2015    Priority: High   Acquired hypothyroidism 11/02/2013    Priority: High   Mixed hyperlipidemia 04/04/2012    Priority: High   Fibromyalgia 10/11/2007    Priority: High   Chronic low back pain 10/11/2007    Priority: High   Osteopenia after menopause 06/06/2019    Priority: Medium  Bilateral hydronephrosis     Priority: Medium    Degenerative scoliosis 09/07/2018    Priority: Medium    Psoriasis 12/02/2017    Priority: Medium    History of Barrett's esophagus 08/29/2015    Priority: Medium    GERD (gastroesophageal reflux disease) 08/29/2015    Priority: Medium    Irritable bowel syndrome with constipation 08/29/2015    Priority: Medium    Lumbosacral spondylosis with radiculopathy 08/29/2015    Priority: Medium    Primary insomnia 10/25/2014    Priority: Medium    Seborrheic dermatitis of scalp 12/02/2017    Priority: Low   Allergic rhinitis 10/11/2007    Priority: Low   Lower extremity edema 04/15/2021   Barrett's esophagus with esophagitis 08/29/2015   Current Meds  Medication Sig   Cholecalciferol (VITAMIN D) 50 MCG (2000 UT) tablet Take 2,000 Units by mouth daily.    clobetasol (TEMOVATE)  0.05 % external solution Apply 1 application topically 2 (two) times daily. Scalp   cyclobenzaprine (FLEXERIL) 10 MG tablet Take 1 tablet (10 mg total) by mouth at bedtime as needed for muscle spasms.   fluocinonide-emollient (LIDEX-E) 0.05 % cream Apply on body daily not on face or folds   gabapentin (NEURONTIN) 300 MG capsule TAKE 2 CAPSULES BY MOUTH 2 TIMES DAILY.   hydrochlorothiazide (HYDRODIURIL) 12.5 MG tablet Take 1 tablet (12.5 mg total) by mouth daily.   levothyroxine (SYNTHROID) 200 MCG tablet Take 1 tablet (200 mcg total) by mouth daily.   lisinopril (ZESTRIL) 5 MG tablet TAKE 1 TABLET BY MOUTH EVERY DAY   meloxicam (MOBIC) 7.5 MG tablet Take 1 tablet (7.5 mg total) by mouth daily as needed. for pain   metFORMIN (GLUCOPHAGE) 1000 MG tablet Take 1 tablet (1,000 mg total) by mouth 2 (two) times daily with a meal.   omeprazole (PRILOSEC) 40 MG capsule TAKE 1 CAPSULE BY MOUTH EVERY DAY   predniSONE (DELTASONE) 10 MG tablet Take 4 tabs qd x 2 days, 3 qd x 2 days, 2 qd x 2d, 1qd x 3 days   rosuvastatin (CRESTOR) 40 MG tablet TAKE 1 TABLET BY MOUTH EVERY DAY   senna-docusate (SENOKOT-S) 8.6-50 MG tablet Take 2 tablets by mouth every evening.    sertraline (ZOLOFT) 100 MG tablet TAKE 2 TABLETS BY MOUTH EVERY DAY   traMADol (ULTRAM) 50 MG tablet Take 1 tablet (50 mg total) by mouth every 8 (eight) hours as needed for up to 5 days.   zolpidem (AMBIEN) 10 MG tablet TAKE 1 TABLET (10 MG TOTAL) BY MOUTH AT BEDTIME AS NEEDED. FOR SLEEP    Allergies: Patient is allergic to ketorolac. Family History: Patient family history includes Arthritis in her brother, daughter, father, maternal grandmother, and mother; Asthma in her daughter; Birth defects in her daughter and father; COPD in her mother; Cancer in her father; Coronary artery disease in her paternal grandfather; Depression in her daughter; Diabetes in her paternal aunt; Emphysema in her mother; Hearing loss in her daughter; Hyperlipidemia in her  brother; Hypertension in her father and mother; Kidney Stones in her daughter; Mental retardation in her daughter; Osteoarthritis in her mother; Prostate cancer in her father; Rheum arthritis in her mother. Social History:  Patient  reports that she has never smoked. She has never used smokeless tobacco. She reports current alcohol use of about 1.0 standard drink per week. She reports that she does not use drugs.  Review of Systems: Constitutional: Negative for fever malaise or anorexia Cardiovascular: negative for chest pain  Respiratory: negative for SOB or persistent cough Gastrointestinal: negative for abdominal pain  Objective  Vitals: BP 132/76   Pulse 72   Temp (!) 97.2 F (36.2 C) (Temporal)   Ht 5\' 3"  (1.6 m)   Wt 217 lb 3.2 oz (98.5 kg)   SpO2 96%   BMI 38.48 kg/m  General: no acute distress , A&Ox3 Mildly uncomfortable standing out of the chair but normal gait.  Decreased forward flexion due to pain.  Tender midline lumbar area with paravertebral spasm.  Right sciatic notch tenderness.  Negative straight leg raise bilaterally.  Normal DTRs.  Normal strength. Left ankle: Lateral swelling with tenderness over the lateral ligaments present.  Pain with inversion.  Full range of motion.  Stable joint    Commons side effects, risks, benefits, and alternatives for medications and treatment plan prescribed today were discussed, and the patient expressed understanding of the given instructions. Patient is instructed to call or message via MyChart if he/she has any questions or concerns regarding our treatment plan. No barriers to understanding were identified. We discussed Red Flag symptoms and signs in detail. Patient expressed understanding regarding what to do in case of urgent or emergency type symptoms.  Medication list was reconciled, printed and provided to the patient in AVS. Patient instructions and summary information was reviewed with the patient as documented in the AVS. This  note was prepared with assistance of Dragon voice recognition software. Occasional wrong-word or sound-a-like substitutions may have occurred due to the inherent limitations of voice recognition software  This visit occurred during the SARS-CoV-2 public health emergency.  Safety protocols were in place, including screening questions prior to the visit, additional usage of staff PPE, and extensive cleaning of exam room while observing appropriate contact time as indicated for disinfecting solutions.

## 2021-09-04 ENCOUNTER — Telehealth: Payer: Self-pay | Admitting: Family Medicine

## 2021-09-04 DIAGNOSIS — U071 COVID-19: Secondary | ICD-10-CM | POA: Diagnosis not present

## 2021-09-04 NOTE — Telephone Encounter (Signed)
Message sent to pt thru MyChart to contact carrier to ask for an alternative that they would cover and let us know. ?

## 2021-09-04 NOTE — Telephone Encounter (Signed)
Pt states medication is not covered under her insurance is asking for something else. Please advise. ? ?MEDICATION:cyclobenzaprine (FLEXERIL) 10 MG tablet ? ?PHARMACY: ?[id number]- [place], [place] - [address] Phone:  [phone] ?Fax:  [phone] ?  ? ?

## 2021-09-11 ENCOUNTER — Telehealth: Payer: Self-pay

## 2021-09-11 NOTE — Telephone Encounter (Addendum)
PA has been approved for Cyclobenzaprine ? ? ? ? ? ?PA has been submitted for cyclobenzaprine HCI '10mg'$ . Waiting on determination ? ? ?(Key: [id number]) ? ? ?

## 2021-09-12 ENCOUNTER — Telehealth: Payer: Self-pay

## 2021-09-12 ENCOUNTER — Encounter: Payer: Self-pay | Admitting: Family

## 2021-09-12 ENCOUNTER — Ambulatory Visit (INDEPENDENT_AMBULATORY_CARE_PROVIDER_SITE_OTHER): Payer: Medicare Other | Admitting: Family

## 2021-09-12 VITALS — BP 123/66 | HR 82 | Temp 97.9°F | Ht 63.0 in | Wt 217.4 lb

## 2021-09-12 DIAGNOSIS — H1031 Unspecified acute conjunctivitis, right eye: Secondary | ICD-10-CM

## 2021-09-12 DIAGNOSIS — J018 Other acute sinusitis: Secondary | ICD-10-CM

## 2021-09-12 MED ORDER — AMOXICILLIN-POT CLAVULANATE 875-125 MG PO TABS: 1.0000 | ORAL_TABLET | Freq: Two times a day (BID) | ORAL | 0 refills | Status: DC

## 2021-09-12 MED ORDER — POLYMYXIN B-TRIMETHOPRIM 10000-0.1 UNIT/ML-% OP SOLN: 1.0000 [drp] | OPHTHALMIC | 0 refills | Status: DC

## 2021-09-12 NOTE — Progress Notes (Signed)
? ?Subjective:  ? ? ? Patient ID: Miranda Mathis, female    DOB: 1951/05/29, 71 y.o.   MRN: [id number] ? ?Chief Complaint  ?Patient presents with  ? Facial Pain  ?  Pt c/o Pain on her right side, Very swollen since lastnight. Pt has tried warm and cold compresses. Some nasal congestion.   ? Headache  ?  Pt c/o a severe headache, Has tried tylenol 1000 mg and did not help.   ? ?HPI: ?Sinusitis: Patient complains of facial pain, headache described as right temporal & frontal, sinus pressure, and eye pain, swelling , with no fever, chills, night sweats or weight loss. Onset of symptoms was 1 day ago, gradually worsening since that time. She is drinking moderate amounts of fluids.  Past history is significant for no history of pneumonia or bronchitis, recently. Patient is non-smoker.  ?Eye irritation:  Pt complains of pain, itching, watery/thick discharge, matting, redness.  Symptoms started 1 day ago. Symptoms are located in right eye. Pt has not been exposed to someone with confirmed conjunctivitis. Pt does also have other sinus symptoms.  ? ?Health Maintenance Due  ?Topic Date Due  ? Zoster Vaccines- Shingrix (2 of 2) 06/08/2019  ? OPHTHALMOLOGY EXAM  07/12/2020  ? ? ?Past Medical History:  ?Diagnosis Date  ? Allergic rhinitis   ? Allergy   ? seasonal allergies  ? Barrett's esophagus   ? Bladder spasms   ? Chronic back pain   ? DDD (degenerative disc disease), lumbar   ? on meds  ? Degenerative scoliosis   ? Depression   ? on meds  ? Diabetes mellitus without complication ([place])   ? on meds  ? Diabetic peripheral neuropathy ([place])   ? Fibromyalgia   ? Full dentures   ? GAD (generalized anxiety disorder)   ? on meds  ? GERD (gastroesophageal reflux disease)   ? on meds  ? History of bladder stone   ? History of colon polyps   ? History of kidney stones   ? History of migraine   ? History of recurrent UTIs   ? History of sepsis   ? 10-18-2018  bacterium-ecoli blood culture  ? Hydronephrosis   ? Hydronephrosis, bilateral   ?  due to DM  ? Hyperlipidemia   ? on meds  ? Hypertension   ? on meds  ? Hypothyroidism 11/02/2013 dx  ? followed by pcp- on meds  ? IBS (irritable bowel syndrome)   ? Insomnia   ? Osteopenia   ? Osteopenia after menopause 06/06/2019  ? dexa 2019 T = -1.9, multiple site osteopenia; recheck 2021  ? Peripheral neuropathy   ? Psoriasis   ? Restless leg syndrome   ? Seborrheic dermatitis of scalp   ? Spondylolysis of lumbar region   ? Wears glasses   ? ? ?Past Surgical History:  ?Procedure Laterality Date  ? ABDOMINAL HYSTERECTOMY  1987  ? BALLOON DILATION  07/10/2011  ? Procedure: BALLOON DILATION;  Surgeon: Fredricka Bonine, MD;  Location: [facility];  Service: Urology;  Laterality: Right;  ? CESAREAN SECTION  X2  ? CHOLECYSTECTOMY    ? COLONOSCOPY  2018  ? at [place] (results not availabe at this time)  ? CYSTO/ BILATERAL URETEROSCOPY / URETERAL BX'S/ BILATERAL URETERAL STENT PLACEMENT/ BLADDER STONE EXTRACTION  03-13-2011  ? CYSTOSCOPY W/ RETROGRADES  07/10/2011  ? Procedure: CYSTOSCOPY WITH RETROGRADE PYELOGRAM;  Surgeon: Fredricka Bonine, MD;  Location: [facility];  Service: Urology;  Laterality: Right;  ? CYSTOSCOPY W/ URETERAL STENT PLACEMENT  07/10/2011  ? Procedure: CYSTOSCOPY WITH STENT REPLACEMENT;  Surgeon: Fredricka Bonine, MD;  Location[facility];  Service: Urology;  Laterality: Right;  ? CYSTOSCOPY W/ URETERAL STENT PLACEMENT Bilateral 10/22/2018  ? Procedure: CYSTOSCOPY WITH RETROGRADE PYELOGRAM/URETERAL STENT PLACEMENT-BILATERAL;  Surgeon: Ardis Hughs, MD;  Location: [place];  Service: Urology;  Laterality: Bilateral;  ? CYSTOSCOPY W/ URETERAL STENT REMOVAL  07/10/2011  ? Procedure: CYSTOSCOPY WITH STENT REMOVAL;  Surgeon: Fredricka Bonine, MD;  Location: [facility];  Service: Urology;  Laterality: Right;  ? CYSTOSCOPY WITH URETEROSCOPY AND STENT PLACEMENT Bilateral 11/29/2018  ? Procedure: CYSTOSCOPY WITH BILATERAL  URETEROSCOPY AND STENT EXCHANGE/ WITH LEFT BRUSH BIOPSY;  Surgeon: Festus Aloe, MD;  Location: [facility];  Service: Urology;  Laterality: Bilateral;  ? CYSTOSCOPY/RETROGRADE/URETEROSCOPY Left 11/14/2019  ? Procedure: CYSTOSCOPY/RETROGRADE/URETEROSCOPY/ BIOPSY/ STENT PLACEMENT;  Surgeon: Festus Aloe, MD;  Location: [facility];  Service: Urology;  Laterality: Left;  ? LUMBAR MICRODISCECTOMY  1990'S  ? L5 - S1  ? RIGHT FOOT SURG  2003  ? HEEL  ? RIGHT URETEROSCOPIC / URETERAL BX/ STENT PLACEMENT  04-17-2011  ? UPPER GASTROINTESTINAL ENDOSCOPY  2018  ? @ [facility] -results not available at this time  ? UPPER GI ENDOSCOPY    ? ? ?Outpatient Medications Prior to Visit  ?Medication Sig Dispense Refill  ? Cholecalciferol (VITAMIN D) 50 MCG (2000 UT) tablet Take 2,000 Units by mouth daily.     ? clobetasol (TEMOVATE) 0.05 % external solution Apply 1 application topically 2 (two) times daily. Scalp 50 mL 6  ? cyclobenzaprine (FLEXERIL) 10 MG tablet Take 1 tablet (10 mg total) by mouth at bedtime as needed for muscle spasms. 30 tablet 0  ? fluocinonide-emollient (LIDEX-E) 0.05 % cream Apply on body daily not on face or folds 60 g 6  ? gabapentin (NEURONTIN) 300 MG capsule TAKE 2 CAPSULES BY MOUTH 2 TIMES DAILY. 360 capsule 3  ? hydrochlorothiazide (HYDRODIURIL) 12.5 MG tablet Take 1 tablet (12.5 mg total) by mouth daily. (Patient taking differently: Take 12.5 mg by mouth as needed.) 90 tablet 3  ? levothyroxine (SYNTHROID) 200 MCG tablet Take 1 tablet (200 mcg total) by mouth daily. 90 tablet 3  ? lisinopril (ZESTRIL) 5 MG tablet TAKE 1 TABLET BY MOUTH EVERY DAY 90 tablet 3  ? meloxicam (MOBIC) 7.5 MG tablet Take 1 tablet (7.5 mg total) by mouth daily as needed. for pain 30 tablet 3  ? metFORMIN (GLUCOPHAGE) 1000 MG tablet Take 1 tablet (1,000 mg total) by mouth 2 (two) times daily with a meal. 180 tablet 3  ? omeprazole (PRILOSEC) 40 MG capsule TAKE 1 CAPSULE BY MOUTH EVERY DAY 90 capsule 1  ?  rosuvastatin (CRESTOR) 40 MG tablet TAKE 1 TABLET BY MOUTH EVERY DAY 90 tablet 3  ? senna-docusate (SENOKOT-S) 8.6-50 MG tablet Take 2 tablets by mouth every evening.     ? sertraline (ZOLOFT) 100 MG tablet TAKE 2 TABLETS BY MOUTH EVERY DAY 180 tablet 2  ? zolpidem (AMBIEN) 10 MG tablet TAKE 1 TABLET (10 MG TOTAL) BY MOUTH AT BEDTIME AS NEEDED. FOR SLEEP 30 tablet 5  ? predniSONE (DELTASONE) 10 MG tablet Take 4 tabs qd x 2 days, 3 qd x 2 days, 2 qd x 2d, 1qd x 3 days 21 tablet 0  ? ?No facility-administered medications prior to visit.  ? ? ?Allergies  ?Allergen Reactions  ? Ketorolac   ?  Other reaction(s): Other (See Comments) ?Not supposed to take  ? ?   ?Objective:  ?  ?Physical Exam ?Vitals and nursing note reviewed.  ?Constitutional:   ?   Appearance: Normal appearance.  ?HENT:  ?   Head: Right periorbital erythema (with swelling) present.  ?   Right Ear: Tympanic membrane and ear canal normal.  ?   Left Ear: Tympanic membrane and ear canal normal.  ?   Nose:  ?   Right Sinus: Maxillary sinus tenderness and frontal sinus tenderness present.  ?   Left Sinus: No maxillary sinus tenderness or frontal sinus tenderness.  ?   Mouth/Throat:  ?   Mouth: Mucous membranes are moist.  ?   Pharynx: No pharyngeal swelling or posterior oropharyngeal erythema.  ?Eyes:  ?   Extraocular Movements: Extraocular movements intact.  ?   Conjunctiva/sclera:  ?   Right eye: Right conjunctiva is injected. Chemosis (mild, lower conjunctiva) and exudate present.  ?Cardiovascular:  ?   Rate and Rhythm: Normal rate and regular rhythm.  ?Pulmonary:  ?   Effort: Pulmonary effort is normal.  ?   Breath sounds: Normal breath sounds.  ?Musculoskeletal:     ?   General: Normal range of motion.  ?Skin: ?   General: Skin is warm and dry.  ?Neurological:  ?   Mental Status: She is alert.  ?Psychiatric:     ?   Mood and Affect: Mood normal.     ?   Behavior: Behavior normal.  ? ? ?BP 123/66 (BP Location: Left Arm, Patient Position: Sitting, Cuff  Size: Large)   Pulse 82   Temp 97.9 ?F (36.6 ?C) (Temporal)   Ht '5\' 3"'$  (1.6 m)   Wt 217 lb 6.4 oz (98.6 kg)   SpO2 93%   BMI 38.51 kg/m?  ?Wt Readings from Last 3 Encounters:  ?09/12/21 217 lb 6.4 oz (98.6 kg)  ?

## 2021-09-12 NOTE — Patient Instructions (Signed)
It was very nice to see you today! ? ?I have sent over an antibiotic, Augmentin for you to take for the next 5 days to help your sinus infection. ?I also sent eye drops to place in your right eye, 1 drop 4 times per day up to 7 days or ok to stop if your symptoms are completely resolved. ?OK to take 2 Ibuprofen 3 times today after eating for today only, 2 times tomorrow if still needed. ? ? ? ?PLEASE NOTE: ? ?If you had any lab tests please let us know if you have not heard back within a few days. You may see your results on MyChart before we have a chance to review them but we will give you a call once they are reviewed by [name]. If we ordered any referrals today, please let us know if you have not heard from their office within the next week.  ? ?Please try these tips to maintain a healthy lifestyle: ? ?Eat most of your calories during the day when you are active. Eliminate processed foods including packaged sweets (pies, cakes, cookies), reduce intake of potatoes, white bread, white pasta, and white rice. Look for whole grain options, oat flour or almond flour. ? ?Each meal should contain half fruits/vegetables, one quarter protein, and one quarter carbs (no bigger than a computer mouse). ? ?Cut down on sweet beverages. This includes juice, soda, and sweet tea. Also watch fruit intake, though this is a healthier sweet option, it still contains natural sugar! Limit to 3 servings daily. ? ?Drink at least 1 glass of water with each meal and aim for at least 8 glasses per day ? ?Exercise at least 150 minutes every week.  ? ?

## 2021-09-12 NOTE — Telephone Encounter (Addendum)
Cyclobenzaprine has been approved ? ?This approval authorizes your coverage from 06/22/2021 - 12/10/2021, unless we notify you  ?otherwise ? ?

## 2021-09-17 ENCOUNTER — Telehealth: Payer: Self-pay | Admitting: Family Medicine

## 2021-09-17 NOTE — Telephone Encounter (Signed)
Spoke with patient she declined AWV request CB in May 2023 ?

## 2021-09-19 DIAGNOSIS — M7672 Peroneal tendinitis, left leg: Secondary | ICD-10-CM | POA: Diagnosis not present

## 2021-09-19 DIAGNOSIS — M25572 Pain in left ankle and joints of left foot: Secondary | ICD-10-CM | POA: Diagnosis not present

## 2021-10-28 ENCOUNTER — Ambulatory Visit (INDEPENDENT_AMBULATORY_CARE_PROVIDER_SITE_OTHER): Payer: Medicare Other | Admitting: Family Medicine

## 2021-10-28 ENCOUNTER — Encounter: Payer: Self-pay | Admitting: Family Medicine

## 2021-10-28 VITALS — BP 116/64 | HR 72 | Temp 97.6°F | Ht 63.0 in | Wt 214.6 lb

## 2021-10-28 DIAGNOSIS — R748 Abnormal levels of other serum enzymes: Secondary | ICD-10-CM | POA: Diagnosis not present

## 2021-10-28 DIAGNOSIS — E1142 Type 2 diabetes mellitus with diabetic polyneuropathy: Secondary | ICD-10-CM

## 2021-10-28 LAB — COMPREHENSIVE METABOLIC PANEL
ALT: 34 U/L (ref 0–35)
AST: 39 U/L — ABNORMAL HIGH (ref 0–37)
Albumin: 4.1 g/dL (ref 3.5–5.2)
Alkaline Phosphatase: 54 U/L (ref 39–117)
BUN: 16 mg/dL (ref 6–23)
CO2: 25 mEq/L (ref 19–32)
Calcium: 9.6 mg/dL (ref 8.4–10.5)
Chloride: 104 mEq/L (ref 96–112)
Creatinine, Ser: 0.97 mg/dL (ref 0.40–1.20)
GFR: 58.82 mL/min — ABNORMAL LOW (ref >60.00)
Glucose, Bld: 137 mg/dL — ABNORMAL HIGH (ref 70–99)
Potassium: 4.3 mEq/L (ref 3.5–5.1)
Sodium: 138 mEq/L (ref 135–145)
Total Bilirubin: 0.3 mg/dL (ref 0.2–1.2)
Total Protein: 7.5 g/dL (ref 6.0–8.3)

## 2021-10-28 LAB — POCT GLYCOSYLATED HEMOGLOBIN (HGB A1C): Hemoglobin A1C: 6.5 % — AB (ref 4.0–5.6)

## 2021-10-28 MED ORDER — GABAPENTIN 300 MG PO CAPS: 600.0000 mg | ORAL_CAPSULE | Freq: Three times a day (TID) | ORAL | 3 refills | Status: DC

## 2021-10-28 NOTE — Patient Instructions (Signed)
Please return in 6 months for recheck.   I will release your lab results to you on your MyChart account with further instructions. You may see the results before I do, but when I review them I will send you a message with my report or have my assistant call you if things need to be discussed. Please reply to my message with any questions. Thank you!   If you have any questions or concerns, please don't hesitate to send me a message via MyChart or call the office at [phone]. Thank you for visiting with us today! It's our pleasure caring for you.  

## 2021-10-28 NOTE — Progress Notes (Signed)
? ?Subjective  ?CC:  ?Chief Complaint  ?Patient presents with  ? Diabetes  ?  Pt here for 42mos F/U for DM  ? ? ?HPI: Miranda Mathis is a 71 y.o. female who presents to the office today for follow up of diabetes and problems listed above in the chief complaint.  ?Diabetes follow up: Her diabetic control is reported as Unchanged. We increased met to 1000 bid last visit. Diet is fair.  She denies exertional CP or SOB or symptomatic hypoglycemia. She denies foot sores . Gabapentin 600 bid helps neuropathy but would like to increase to 600 tid for further relief.  ?Mildly elevated liver tests on labs in [place]. Was taking lots of tylenol. Has decreased use. On statin. No ruq pain or jaundice.  ? ?Wt Readings from Last 3 Encounters:  ?10/28/21 214 lb 9.6 oz (97.3 kg)  ?09/12/21 217 lb 6.4 oz (98.6 kg)  ?09/02/21 217 lb 3.2 oz (98.5 kg)  ? ? ?BP Readings from Last 3 Encounters:  ?10/28/21 116/64  ?09/12/21 123/66  ?09/02/21 132/76  ? ? ?Assessment  ?1. DM type 2 with diabetic peripheral neuropathy ([place])   ?2. Elevated liver enzymes   ? ?  ?Plan  ?Diabetes is currently well controlled. Continue met 1000 bid and diet.  ?Neuropathy; increase gabapentin to 600 tid.  ?Recheck liver tests.  ? ?Follow up: 6 mo for recheck ?Orders Placed This Encounter  ?Procedures  ? Comprehensive metabolic panel  ? POCT HgB A1C  ? ?Meds ordered this encounter  ?Medications  ? gabapentin (NEURONTIN) 300 MG capsule  ?  Sig: Take 2 capsules (600 mg total) by mouth 3 (three) times daily.  ?  Dispense:  540 capsule  ?  Refill:  3  ? ?  ? ?Immunization History  ?Administered Date(s) Administered  ? Fluad Quad(high Dose 65+) 04/13/2019, 04/04/2020, 04/15/2021  ? Influenza Whole 03/22/2001, 03/22/2006, 03/22/2010  ? Influenza, High Dose Seasonal PF 02/19/2016, 06/02/2018  ? Influenza, Seasonal, Injecte, Preservative Fre 06/27/2012  ? Influenza-Unspecified 04/13/2019  ? Moderna Sars-Covid-2 Vaccination 08/14/2019  ? PFIZER(Purple Top)SARS-COV-2 Vaccination  07/17/2019, 07/24/2019, 05/08/2020  ? Pneumococcal Conjugate-13 11/27/2015  ? Pneumococcal Polysaccharide-23 11/13/2010, 07/16/2021  ? Tdap 11/13/2010, 09/14/2020  ? Zoster Recombinat (Shingrix) 04/13/2019  ? Zoster, Live 11/27/2015  ? ? ?Diabetes Related Lab Review: ?Lab Results  ?Component Value Date  ? HGBA1C 6.5 (A) 10/28/2021  ? HGBA1C 7.3 (A) 07/16/2021  ? HGBA1C 6.4 (A) 04/15/2021  ?  ?Lab Results  ?Component Value Date  ? MICROALBUR 1.3 04/15/2021  ? ?Lab Results  ?Component Value Date  ? CREATININE 1.03 07/16/2021  ? BUN 21 07/16/2021  ? NA 139 07/16/2021  ? K 4.8 07/16/2021  ? CL 102 07/16/2021  ? CO2 29 07/16/2021  ? ?Lab Results  ?Component Value Date  ? CHOL 122 07/16/2021  ? CHOL 123 10/16/2020  ? CHOL 143 07/11/2020  ? ?Lab Results  ?Component Value Date  ? HDL 36.70 (L) 07/16/2021  ? HDL 41.60 10/16/2020  ? HDL 34.80 (L) 07/11/2020  ? ?Lab Results  ?Component Value Date  ? [place] 64 07/16/2021  ? [place] 66 10/16/2020  ? [place] 86 07/11/2020  ? ?Lab Results  ?Component Value Date  ? TRIG 108.0 07/16/2021  ? TRIG 75.0 10/16/2020  ? TRIG 108.0 07/11/2020  ? ?Lab Results  ?Component Value Date  ? CHOLHDL 3 07/16/2021  ? CHOLHDL 3 10/16/2020  ? CHOLHDL 4 07/11/2020  ? ?Lab Results  ?Component Value Date  ? LDLDIRECT  165.3 11/13/2010  ? LDLDIRECT 143.5 08/30/2008  ? ?The ASCVD Risk score (Arnett DK, et al., 2019) failed to calculate for the following reasons: ?  The valid total cholesterol range is 130 to 320 mg/dL ?I have reviewed the [place], Fam and Soc history. ?Patient Active Problem List  ? Diagnosis Date Noted  ? Depression, major, single episode, moderate ([place]) 07/11/2020  ?  Priority: High  ? DM type 2 with diabetic peripheral neuropathy ([place]) 09/02/2016  ?  Priority: High  ?  Diagnosed 2019; started meds 2020; + microalbunuria on ace ? ?  ? Recurrent kidney stones 08/29/2015  ?  Priority: High  ?  Overview:  ?S/p stents, multiple, [facility], Dr. Linton Ham ? ?  ? GAD (generalized anxiety disorder)  08/29/2015  ?  Priority: High  ? Acquired hypothyroidism 11/02/2013  ?  Priority: High  ? Mixed hyperlipidemia 04/04/2012  ?  Priority: High  ? Fibromyalgia 10/11/2007  ?  Priority: High  ? Chronic low back pain 10/11/2007  ?  Priority: High  ? Osteopenia after menopause 06/06/2019  ?  Priority: Medium   ?  dexa 2019 T = -1.9, multiple site osteopenia;  ?DEXA December 2022, osteopenia ? ?  ? Bilateral hydronephrosis   ?  Priority: Medium   ? Degenerative scoliosis 09/07/2018  ?  Priority: Medium   ? Psoriasis 12/02/2017  ?  Priority: Medium   ? History of Barrett's esophagus 08/29/2015  ?  Priority: Medium   ?  Last EGD 2018, no evidence of Barrett's esophagus, no further EGD recommended ? ?  ? GERD (gastroesophageal reflux disease) 08/29/2015  ?  Priority: Medium   ? Irritable bowel syndrome with constipation 08/29/2015  ?  Priority: Medium   ? Lumbosacral spondylosis with radiculopathy 08/29/2015  ?  Priority: Medium   ? Primary insomnia 10/25/2014  ?  Priority: Medium   ?  Overview:  ?Last Assessment & Plan:  ?Insomnia is stable with current regimen of Ambien. Continue current dosage of Ambien. ? ?  ? Seborrheic dermatitis of scalp 12/02/2017  ?  Priority: Low  ? Allergic rhinitis 10/11/2007  ?  Priority: Low  ?  Qualifier: Diagnosis of  ?By: Tiney Rouge CMA, Ellison Hughs  ? ? ?  ? Lower extremity edema 04/15/2021  ? Barrett's esophagus with esophagitis 08/29/2015  ?  Formatting of this note might be different from the original. ?Last EGD 2018, no evidence of Barrett's esophagus, no further EGD recommended ? ?  ? ? ?Social History: ?Patient  reports that she has never smoked. She has never used smokeless tobacco. She reports current alcohol use of about 1.0 standard drink per week. She reports that she does not use drugs. ? ?Review of Systems: ?Ophthalmic: negative for eye pain, loss of vision or double vision ?Cardiovascular: negative for chest pain ?Respiratory: negative for SOB or persistent cough ?Gastrointestinal:  negative for abdominal pain ?Genitourinary: negative for dysuria or gross hematuria ?MSK: negative for foot lesions ?Neurologic: negative for weakness or gait disturbance ? ?Objective  ?Vitals: BP 116/64   Pulse 72   Temp 97.6 ?F (36.4 ?C)   Ht $R'5\' 3"'Uc$  (1.6 m)   Wt 214 lb 9.6 oz (97.3 kg)   SpO2 94%   BMI 38.01 kg/m?  ?General: well appearing, no acute distress  ?Psych:  Alert and oriented, normal mood and affect ?HEENT:  Normocephalic, atraumatic, moist mucous membranes, supple neck  ?Cardiovascular:  Nl S1 and S2, RRR without murmur, gallop or rub. no edema ?Respiratory:  Good  breath sounds bilaterally, CTAB with normal effort, no rales ?Gastrointestinal: normal BS, soft, nontender ?Skin:  Warm, no rashes ?Neurologic:   Mental status is normal. normal gait ?Foot exam: no erythema, pallor, or cyanosis visible nl proprioception and sensation to monofilament testing bilaterally, +2 distal pulses bilaterally ? ? ? ?Diabetic education: ongoing education regarding chronic disease management for diabetes was given today. We continue to reinforce the ABC's of diabetic management: A1c (<7 or 8 dependent upon patient), tight blood pressure control, and cholesterol management with goal LDL < 100 minimally. We discuss diet strategies, exercise recommendations, medication options and possible side effects. At each visit, we review recommended immunizations and preventive care recommendations for diabetics and stress that good diabetic control can prevent other problems. See below for this patient's data. ? ? ?Commons side effects, risks, benefits, and alternatives for medications and treatment plan prescribed today were discussed, and the patient expressed understanding of the given instructions. Patient is instructed to call or message via MyChart if he/she has any questions or concerns regarding our treatment plan. No barriers to understanding were identified. We discussed Red Flag symptoms and signs in detail. Patient  expressed understanding regarding what to do in case of urgent or emergency type symptoms.  ?Medication list was reconciled, printed and provided to the patient in AVS. Patient instructions and summary informat

## 2021-11-04 ENCOUNTER — Ambulatory Visit (INDEPENDENT_AMBULATORY_CARE_PROVIDER_SITE_OTHER): Payer: Medicare Other | Admitting: Family Medicine

## 2021-11-04 ENCOUNTER — Encounter: Payer: Self-pay | Admitting: Family Medicine

## 2021-11-04 VITALS — BP 118/78 | HR 74 | Temp 98.7°F | Ht 63.0 in | Wt 213.8 lb

## 2021-11-04 DIAGNOSIS — M5431 Sciatica, right side: Secondary | ICD-10-CM | POA: Diagnosis not present

## 2021-11-04 DIAGNOSIS — M25561 Pain in right knee: Secondary | ICD-10-CM

## 2021-11-04 MED ORDER — TRAMADOL HCL 50 MG PO TABS: 50.0000 mg | ORAL_TABLET | Freq: Three times a day (TID) | ORAL | 0 refills | Status: AC | PRN

## 2021-11-04 MED ORDER — PREDNISONE 10 MG PO TABS: ORAL_TABLET | ORAL | 0 refills | Status: DC

## 2021-11-04 NOTE — Progress Notes (Signed)
? ?Subjective  ?CC:  ?Chief Complaint  ?Patient presents with  ? Leg Pain  ?  Pt stated that she has been having some Rt leg pain since 10/30/21. Pain level is a 7 w/o wt. OTC tylenol  ? ? ?HPI: Miranda Mathis is a 71 y.o. female who presents to the office today to address the problems listed above in the chief complaint. ?71 year old with history of sciatica reports 3 to 4-day history of right-sided low back pain that radiates down to her right ankle.  Pain is electric in nature and at times moderate.  Using Tylenol without significant relief.  No bowel or bladder dysfunction.  No trauma.  No weakness.  However she does also have some right knee pain.It is not swollen or red. ?Assessment  ?1. Right sided sciatica   ?2. Acute pain of right knee   ? ?  ?Plan  ?Right-sided sciatica with knee pain: Discussed diagnosis.  Treat with prednisone taper and tramadol.  Recheck in 1 to 2 weeks if not improved.  Discussed hyperglycemia side effect.  Ice knee.  Possibly arthritis flare or related to sciatica pain.  We will follow-up if worsens. ? ?Follow up: As scheduled, or sooner if pain does not resolve ?04/30/2022 ? ?No orders of the defined types were placed in this encounter. ? ?Meds ordered this encounter  ?Medications  ? predniSONE (DELTASONE) 10 MG tablet  ?  Sig: Take 4 tabs qd x 2 days, 3 qd x 2 days, 2 qd x 2d, 1qd x 3 days  ?  Dispense:  21 tablet  ?  Refill:  0  ? traMADol (ULTRAM) 50 MG tablet  ?  Sig: Take 1 tablet (50 mg total) by mouth every 8 (eight) hours as needed for up to 5 days.  ?  Dispense:  20 tablet  ?  Refill:  0  ? ?  ? ?I reviewed the patients updated PMH, FH, and SocHx.  ?  ?Patient Active Problem List  ? Diagnosis Date Noted  ? Depression, major, single episode, moderate ([place]) 07/11/2020  ?  Priority: High  ? DM type 2 with diabetic peripheral neuropathy ([place]) 09/02/2016  ?  Priority: High  ? Recurrent kidney stones 08/29/2015  ?  Priority: High  ? GAD (generalized anxiety disorder) 08/29/2015  ?   Priority: High  ? Acquired hypothyroidism 11/02/2013  ?  Priority: High  ? Mixed hyperlipidemia 04/04/2012  ?  Priority: High  ? Fibromyalgia 10/11/2007  ?  Priority: High  ? Chronic low back pain 10/11/2007  ?  Priority: High  ? Osteopenia after menopause 06/06/2019  ?  Priority: Medium   ? Bilateral hydronephrosis   ?  Priority: Medium   ? Degenerative scoliosis 09/07/2018  ?  Priority: Medium   ? Psoriasis 12/02/2017  ?  Priority: Medium   ? History of Barrett's esophagus 08/29/2015  ?  Priority: Medium   ? GERD (gastroesophageal reflux disease) 08/29/2015  ?  Priority: Medium   ? Irritable bowel syndrome with constipation 08/29/2015  ?  Priority: Medium   ? Lumbosacral spondylosis with radiculopathy 08/29/2015  ?  Priority: Medium   ? Primary insomnia 10/25/2014  ?  Priority: Medium   ? Seborrheic dermatitis of scalp 12/02/2017  ?  Priority: Low  ? Allergic rhinitis 10/11/2007  ?  Priority: Low  ? Lower extremity edema 04/15/2021  ? Barrett's esophagus with esophagitis 08/29/2015  ? ?Current Meds  ?Medication Sig  ? Cholecalciferol (VITAMIN D) 50 MCG (2000 UT)  tablet Take 2,000 Units by mouth daily.   ? clobetasol (TEMOVATE) 0.05 % external solution Apply 1 application topically 2 (two) times daily. Scalp  ? cyclobenzaprine (FLEXERIL) 10 MG tablet Take 1 tablet (10 mg total) by mouth at bedtime as needed for muscle spasms.  ? fluocinonide-emollient (LIDEX-E) 0.05 % cream Apply on body daily not on face or folds  ? gabapentin (NEURONTIN) 300 MG capsule Take 2 capsules (600 mg total) by mouth 3 (three) times daily.  ? hydrochlorothiazide (HYDRODIURIL) 12.5 MG tablet Take 1 tablet (12.5 mg total) by mouth daily.  ? levothyroxine (SYNTHROID) 200 MCG tablet Take 1 tablet (200 mcg total) by mouth daily.  ? lisinopril (ZESTRIL) 5 MG tablet TAKE 1 TABLET BY MOUTH EVERY DAY  ? meloxicam (MOBIC) 7.5 MG tablet Take 1 tablet (7.5 mg total) by mouth daily as needed. for pain  ? metFORMIN (GLUCOPHAGE) 1000 MG tablet Take 1  tablet (1,000 mg total) by mouth 2 (two) times daily with a meal.  ? omeprazole (PRILOSEC) 40 MG capsule TAKE 1 CAPSULE BY MOUTH EVERY DAY  ? predniSONE (DELTASONE) 10 MG tablet Take 4 tabs qd x 2 days, 3 qd x 2 days, 2 qd x 2d, 1qd x 3 days  ? rosuvastatin (CRESTOR) 40 MG tablet TAKE 1 TABLET BY MOUTH EVERY DAY  ? senna-docusate (SENOKOT-S) 8.6-50 MG tablet Take 2 tablets by mouth every evening.   ? sertraline (ZOLOFT) 100 MG tablet TAKE 2 TABLETS BY MOUTH EVERY DAY  ? traMADol (ULTRAM) 50 MG tablet Take 1 tablet (50 mg total) by mouth every 8 (eight) hours as needed for up to 5 days.  ? zolpidem (AMBIEN) 10 MG tablet TAKE 1 TABLET (10 MG TOTAL) BY MOUTH AT BEDTIME AS NEEDED. FOR SLEEP  ? ? ?Allergies: ?Patient is allergic to ketorolac. ?Family History: ?Patient family history includes Arthritis in her brother, daughter, father, maternal grandmother, and mother; Asthma in her daughter; Birth defects in her daughter and father; COPD in her mother; Cancer in her father; Coronary artery disease in her paternal grandfather; Depression in her daughter; Diabetes in her paternal aunt; Emphysema in her mother; Hearing loss in her daughter; Hyperlipidemia in her brother; Hypertension in her father and mother; Kidney Stones in her daughter; Mental retardation in her daughter; Osteoarthritis in her mother; Prostate cancer in her father; Rheum arthritis in her mother. ?Social History:  ?Patient  reports that she has never smoked. She has never used smokeless tobacco. She reports current alcohol use of about 1.0 standard drink per week. She reports that she does not use drugs. ? ?Review of Systems: ?Constitutional: Negative for fever malaise or anorexia ?Cardiovascular: negative for chest pain ?Respiratory: negative for SOB or persistent cough ?Gastrointestinal: negative for abdominal pain ? ?Objective  ?Vitals: BP 118/78   Pulse 74   Temp 98.7 ?F (37.1 ?C)   Ht '5\' 3"'$  (1.6 m)   Wt 213 lb 12.8 oz (97 kg)   SpO2 95%   BMI  37.87 kg/m?  ?General: Uncomfortable with walking ?Low back: Nontender lumbar spine.  Tender right sciatic notch.  Decreased flexion due to pain.  Antalgic gait.  Negative straight leg raise bilaterally.  Normal DTRs. ?Right knee: Without warmth redness or effusion.  Positive crepitus.  Positive lateral tenderness.  Full range of motion ? ? ? ?Commons side effects, risks, benefits, and alternatives for medications and treatment plan prescribed today were discussed, and the patient expressed understanding of the given instructions. Patient is instructed to call or message  via MyChart if he/she has any questions or concerns regarding our treatment plan. No barriers to understanding were identified. We discussed Red Flag symptoms and signs in detail. Patient expressed understanding regarding what to do in case of urgent or emergency type symptoms.  ?Medication list was reconciled, printed and provided to the patient in AVS. Patient instructions and summary information was reviewed with the patient as documented in the AVS. ?This note was prepared with assistance of Systems analyst. Occasional wrong-word or sound-a-like substitutions may have occurred due to the inherent limitations of voice recognition software ? ?This visit occurred during the SARS-CoV-2 public health emergency.  Safety protocols were in place, including screening questions prior to the visit, additional usage of staff PPE, and extensive cleaning of exam room while observing appropriate contact time as indicated for disinfecting solutions.  ? ?

## 2021-11-04 NOTE — Patient Instructions (Signed)
Please follow up as scheduled for your next visit with me: 04/30/2022 sooner if your pain does not improve. ? ?If you have any questions or concerns, please don't hesitate to send me a message via MyChart or call the office at [phone]. Thank you for visiting with [name] today! It's our pleasure caring for you.  ? ?Sciatica ? ?Sciatica is pain, numbness, weakness, or tingling along the path of the sciatic nerve. The sciatic nerve starts in the lower back and runs down the back of each leg. The nerve controls the muscles in the lower leg and in the back of the knee. It also provides feeling (sensation) to the back of the thigh, the lower leg, and the sole of the foot. Sciatica is a symptom of another medical condition that pinches or puts pressure on the sciatic nerve. ?Sciatica most often only affects one side of the body. Sciatica usually goes away on its own or with treatment. In some cases, sciatica may come back (recur). ?What are the causes? ?This condition is caused by pressure on the sciatic nerve or pinching of the nerve. This may be the result of: ?A disk in between the bones of the spine bulging out too far (herniated disk). ?Age-related changes in the spinal disks. ?A pain disorder that affects a muscle in the buttock. ?Extra bone growth near the sciatic nerve. ?A break (fracture) of the pelvis. ?Pregnancy. ?Tumor. This is rare. ?What increases the risk? ?The following factors may make you more likely to develop this condition: ?Playing sports that place pressure or stress on the spine. ?Having poor strength and flexibility. ?A history of back injury or surgery. ?Sitting for long periods of time. ?Doing activities that involve repetitive bending or lifting. ?Obesity. ?What are the signs or symptoms? ?Symptoms can vary from mild to very severe, and they may include: ?Any of these problems in the lower back, leg, hip, or buttock: ?Mild tingling, numbness, or dull aches. ?Burning sensations. ?Sharp  pains. ?Numbness in the back of the calf or the sole of the foot. ?Leg weakness. ?Severe back pain that makes movement difficult. ?Symptoms may get worse when you cough, sneeze, or laugh, or when you sit or stand for long periods of time. ?How is this diagnosed? ?This condition may be diagnosed based on: ?Your symptoms and medical history. ?A physical exam. ?Blood tests. ?Imaging tests, such as: ?X-rays. ?MRI. ?CT scan. ?How is this treated? ?In many cases, this condition improves on its own without treatment. However, treatment may include: ?Reducing or modifying physical activity. ?Exercising and stretching. ?Icing and applying heat to the affected area. ?Medicines that help to: ?Relieve pain and swelling. ?Relax your muscles. ?Injections of medicines that help to relieve pain, irritation, and inflammation around the sciatic nerve (steroids). ?Surgery. ?Follow these instructions at home: ?Medicines ?Take over-the-counter and prescription medicines only as told by your health care provider. ?Ask your health care provider if the medicine prescribed to you: ?Requires you to avoid driving or using heavy machinery. ?Can cause constipation. You may need to take these actions to prevent or treat constipation: ?Drink enough fluid to keep your urine pale yellow. ?Take over-the-counter or prescription medicines. ?Eat foods that are high in fiber, such as beans, whole grains, and fresh fruits and vegetables. ?Limit foods that are high in fat and processed sugars, such as fried or sweet foods. ?Managing pain ? ?  ? ?If directed, put ice on the affected area. ?Put ice in a plastic bag. ?Place a towel between your  skin and the bag. ?Leave the ice on for 20 minutes, 2-3 times a day. ?If directed, apply heat to the affected area. Use the heat source that your health care provider recommends, such as a moist heat pack or a heating pad. ?Place a towel between your skin and the heat source. ?Leave the heat on for 20-30  minutes. ?Remove the heat if your skin turns bright red. This is especially important if you are unable to feel pain, heat, or cold. You may have a greater risk of getting burned. ?Activity ? ?Return to your normal activities as told by your health care provider. Ask your health care provider what activities are safe for you. ?Avoid activities that make your symptoms worse. ?Take brief periods of rest throughout the day. ?When you rest for longer periods, mix in some mild activity or stretching between periods of rest. This will help to prevent stiffness and pain. ?Avoid sitting for long periods of time without moving. Get up and move around at least one time each hour. ?Exercise and stretch regularly, as told by your health care provider. ?Do not lift anything that is heavier than 10 lb (4.5 kg) while you have symptoms of sciatica. When you do not have symptoms, you should still avoid heavy lifting, especially repetitive heavy lifting. ?When you lift objects, always use proper lifting technique, which includes: ?Bending your knees. ?Keeping the load close to your body. ?Avoiding twisting. ?General instructions ?Maintain a healthy weight. Excess weight puts extra stress on your back. ?Wear supportive, comfortable shoes. Avoid wearing high heels. ?Avoid sleeping on a mattress that is too soft or too hard. A mattress that is firm enough to support your back when you sleep may help to reduce your pain. ?Keep all follow-up visits as told by your health care provider. This is important. ?Contact a health care provider if: ?You have pain that: ?Wakes you up when you are sleeping. ?Gets worse when you lie down. ?Is worse than you have experienced in the past. ?Lasts longer than 4 weeks. ?You have an unexplained weight loss. ?Get help right away if: ?You are not able to control when you urinate or have bowel movements (incontinence). ?You have: ?Weakness in your lower back, pelvis, buttocks, or legs that gets  worse. ?Redness or swelling of your back. ?A burning sensation when you urinate. ?Summary ?Sciatica is pain, numbness, weakness, or tingling along the path of the sciatic nerve. ?This condition is caused by pressure on the sciatic nerve or pinching of the nerve. ?Sciatica can cause pain, numbness, or tingling in the lower back, legs, hips, and buttocks. ?Treatment often includes rest, exercise, medicines, and applying ice or heat. ?This information is not intended to replace advice given to you by your health care provider. Make sure you discuss any questions you have with your health care provider. ?Document Revised: 06/27/2018 Document Reviewed: 06/27/2018 ?Elsevier Patient Education ? [place]. ? ? ? ?

## 2021-11-06 DIAGNOSIS — M25561 Pain in right knee: Secondary | ICD-10-CM | POA: Diagnosis not present

## 2021-11-19 ENCOUNTER — Telehealth: Payer: Self-pay | Admitting: Family Medicine

## 2021-11-19 DIAGNOSIS — M25561 Pain in right knee: Secondary | ICD-10-CM | POA: Diagnosis not present

## 2021-11-19 NOTE — Telephone Encounter (Signed)
PA has been sent to plan. Waiting on approval/denial  Miranda Mathis (Key: [id number])  Zolpidem '10mg'$  tab  Zolpidem has been approved from 06/22/2021-11/19/2022

## 2021-11-19 NOTE — Telephone Encounter (Signed)
Pt states her pharmacy sent over a PA for her rx and she was just requesting an update.  MEDICATION: zolpidem (AMBIEN) 10 MG tablet

## 2021-12-03 DIAGNOSIS — M25561 Pain in right knee: Secondary | ICD-10-CM | POA: Diagnosis not present

## 2021-12-10 DIAGNOSIS — M25561 Pain in right knee: Secondary | ICD-10-CM | POA: Diagnosis not present

## 2021-12-11 DIAGNOSIS — M25561 Pain in right knee: Secondary | ICD-10-CM | POA: Diagnosis not present

## 2021-12-16 DIAGNOSIS — M25561 Pain in right knee: Secondary | ICD-10-CM | POA: Diagnosis not present

## 2022-01-02 ENCOUNTER — Other Ambulatory Visit: Payer: Self-pay | Admitting: Dermatology

## 2022-01-06 ENCOUNTER — Other Ambulatory Visit: Payer: Self-pay | Admitting: Family Medicine

## 2022-01-08 ENCOUNTER — Other Ambulatory Visit: Payer: Self-pay | Admitting: Family Medicine

## 2022-03-08 ENCOUNTER — Other Ambulatory Visit: Payer: Self-pay | Admitting: Family Medicine

## 2022-03-10 ENCOUNTER — Ambulatory Visit (INDEPENDENT_AMBULATORY_CARE_PROVIDER_SITE_OTHER): Payer: Medicare PPO | Admitting: Family Medicine

## 2022-03-10 ENCOUNTER — Encounter: Payer: Self-pay | Admitting: Family Medicine

## 2022-03-10 VITALS — BP 112/68 | HR 64 | Temp 98.0°F | Ht 63.0 in | Wt 217.2 lb

## 2022-03-10 DIAGNOSIS — M5431 Sciatica, right side: Secondary | ICD-10-CM | POA: Diagnosis not present

## 2022-03-10 MED ORDER — TRAMADOL HCL 50 MG PO TABS: 50.0000 mg | ORAL_TABLET | Freq: Three times a day (TID) | ORAL | 0 refills | Status: DC | PRN

## 2022-03-10 MED ORDER — CYCLOBENZAPRINE HCL 5 MG PO TABS: 5.0000 mg | ORAL_TABLET | Freq: Three times a day (TID) | ORAL | 0 refills | Status: DC | PRN

## 2022-03-10 NOTE — Progress Notes (Signed)
Subjective:     Patient ID: Miranda Mathis, female    DOB: Jul 01, 1950, 71 y.o.   MRN: [id number]  Chief Complaint  Patient presents with   Sciatica    Need new rx for shingles vaccine    HPI Sciatica-started worsening in May-LBP w/rad down R leg to ankle.  Electric at times.  No b/b dysfunction.  No weakness.  Was on tramadol and pred taper. Got better in May. Sciatia worse past 1 wk.  Has DJD.  This flare-no injury.  At times, leg feels "dead" and hard to get up steps, leg can give out.  Doing some HEP, no official PT.  Has had injections long time ago.  Lumbar discectomy 28yr ago.   Seeing Dr. GGladstone Lighterfor knee pain-injected  There are no preventive care reminders to display for this patient.   Past Medical History:  Diagnosis Date   Allergic rhinitis    Allergy    seasonal allergies   Barrett's esophagus    Bladder spasms    Chronic back pain    DDD (degenerative disc disease), lumbar    on meds   Degenerative scoliosis    Depression    on meds   Diabetes mellitus without complication (HCC)    on meds   Diabetic peripheral neuropathy (HCC)    Fibromyalgia    Full dentures    GAD (generalized anxiety disorder)    on meds   GERD (gastroesophageal reflux disease)    on meds   History of bladder stone    History of colon polyps    History of kidney stones    History of migraine    History of recurrent UTIs    History of sepsis    10-18-2018  bacterium-ecoli blood culture   Hydronephrosis    Hydronephrosis, bilateral    due to DM   Hyperlipidemia    on meds   Hypertension    on meds   Hypothyroidism 11/02/2013 dx   followed by pcp- on meds   IBS (irritable bowel syndrome)    Insomnia    Osteopenia    Osteopenia after menopause 06/06/2019   dexa 2019 T = -1.9, multiple site osteopenia; recheck 2021   Peripheral neuropathy    Psoriasis    Restless leg syndrome    Seborrheic dermatitis of scalp    Spondylolysis of lumbar region    Wears glasses     Past  Surgical History:  Procedure Laterality Date   ABDOMINAL HYSTERECTOMY  1987   BALLOON DILATION  07/10/2011   Procedure: BALLOON DILATION;  Surgeon: MFredricka Bonine MD;  [facility]  Service: Urology;  Laterality: Right;   CESAREAN SECTION  X2   CHOLECYSTECTOMY     [place]   CYSTO/ BILATERAL URETEROSCOPY / URETERAL BX'S/ BILATERAL URETERAL STENT PLACEMENT/ BLADDER STONE EXTRACTION  03-13-2011   CYSTOSCOPY W/ RETROGRADES  07/10/2011   Procedure: CYSTOSCOPY WITH RETROGRADE PYELOGRAM;  Surgeon: MFredricka Bonine MD;  [facility]  Service: Urology;  Laterality: Right;   CYSTOSCOPY W/ URETERAL STENT PLACEMENT  07/10/2011   Procedure: CYSTOSCOPY WITH STENT REPLACEMENT;  Surgeon: MFredricka Bonine MD;  [facility]  Service: Urology;  Laterality: Right;   CYSTOSCOPY W/ URETERAL STENT PLACEMENT Bilateral 10/22/2018   Procedure: CYSTOSCOPY WITH RETROGRADE PYELOGRAM/URETERAL STENT PLACEMENT-BILATERAL;  Surgeon: HArdis Hughs MD;  [place]  Service: Urology;  Laterality: Bilateral;   CYSTOSCOPY W/ URETERAL STENT REMOVAL  07/10/2011   Procedure: CYSTOSCOPY WITH STENT REMOVAL;  Surgeon: Fredricka Bonine, MD;  [facility];  Service: Urology;  Laterality: Right;   CYSTOSCOPY WITH URETEROSCOPY AND STENT PLACEMENT Bilateral 11/29/2018   Procedure: CYSTOSCOPY WITH BILATERAL URETEROSCOPY AND STENT EXCHANGE/ WITH LEFT BRUSH BIOPSY;  Surgeon: Festus Aloe, MD;  [facility];  Service: Urology;  Laterality: Bilateral;   CYSTOSCOPY/RETROGRADE/URETEROSCOPY Left 11/14/2019   Procedure: CYSTOSCOPY/RETROGRADE/URETEROSCOPY/ BIOPSY/ STENT PLACEMENT;  Surgeon: Festus Aloe, MD;  Location: [facility];  Service: Urology;  Laterality: Left;   LUMBAR MICRODISCECTOMY  1990'S   L5 - S1   RIGHT FOOT SURG   2003   HEEL   RIGHT URETEROSCOPIC / URETERAL BX/ STENT PLACEMENT  04-17-2011   UPPER GASTROINTESTINAL ENDOSCOPY  2018   @ [facility] -results not available at this time   UPPER GI ENDOSCOPY      Outpatient Medications Prior to Visit  Medication Sig Dispense Refill   Cholecalciferol (VITAMIN D) 50 MCG (2000 UT) tablet Take 2,000 Units by mouth daily.      clobetasol (TEMOVATE) 0.05 % external solution Apply 1 application topically 2 (two) times daily. Scalp 50 mL 6   fluocinonide-emollient (LIDEX-E) 0.05 % cream Apply on body daily not on face or folds 60 g 6   gabapentin (NEURONTIN) 300 MG capsule Take 2 capsules (600 mg total) by mouth 3 (three) times daily. 540 capsule 3   hydrochlorothiazide (HYDRODIURIL) 12.5 MG tablet Take 1 tablet (12.5 mg total) by mouth daily. 90 tablet 3   levothyroxine (SYNTHROID) 200 MCG tablet Take 1 tablet (200 mcg total) by mouth daily. 90 tablet 3   lisinopril (ZESTRIL) 5 MG tablet TAKE 1 TABLET BY MOUTH EVERY DAY 90 tablet 3   meloxicam (MOBIC) 7.5 MG tablet TAKE 1 TABLET (7.5 MG TOTAL) BY MOUTH DAILY AS NEEDED FOR PAIN 30 tablet 3   metFORMIN (GLUCOPHAGE) 1000 MG tablet Take 1 tablet (1,000 mg total) by mouth 2 (two) times daily with a meal. 180 tablet 3   omeprazole (PRILOSEC) 40 MG capsule TAKE 1 CAPSULE BY MOUTH EVERY DAY 90 capsule 1   rosuvastatin (CRESTOR) 40 MG tablet TAKE 1 TABLET BY MOUTH EVERY DAY 90 tablet 3   senna-docusate (SENOKOT-S) 8.6-50 MG tablet Take 2 tablets by mouth every evening.      sertraline (ZOLOFT) 100 MG tablet TAKE 2 TABLETS BY MOUTH EVERY DAY 180 tablet 2   zolpidem (AMBIEN) 10 MG tablet TAKE 1 TABLET (10 MG TOTAL) BY MOUTH AT BEDTIME AS NEEDED FOR SLEEP 30 tablet 5   cyclobenzaprine (FLEXERIL) 10 MG tablet TAKE 1 TABLET BY MOUTH AT BEDTIME AS NEEDED FOR MUSCLE SPASMS 30 tablet 0   predniSONE (DELTASONE) 10 MG tablet Take 4 tabs qd x 2 days, 3 qd x 2 days, 2 qd x 2d, 1qd x 3 days 21 tablet 0   No facility-administered  medications prior to visit.    Allergies  Allergen Reactions   Ketorolac     Other reaction(s): Other (See Comments) Not supposed to take   ROS neg/noncontributory except as noted HPI/below Balance issues-fell last wk-some abrasions arms/abdomen.       Objective:    BP 112/68   Pulse 64   Temp 98 F (36.7 C) (Temporal)   Ht '5\' 3"'$  (1.6 m)   Wt 217 lb 4 oz (98.5 kg)   SpO2 95%   BMI 38.48 kg/m  Wt Readings from  Last 3 Encounters:  03/10/22 217 lb 4 oz (98.5 kg)  11/04/21 213 lb 12.8 oz (97 kg)  10/28/21 214 lb 9.6 oz (97.3 kg)    Physical Exam   Gen: WDWN NAD HEENT: NCAT, conjunctiva not injected, sclera nonicteric ABDOMEN:  BS+, soft, NTND, No HSM, no masses EXT:  tredema MSK: Back:  can stand on heels/toes/1 leg.  + TTP-lower back(old per pt). Some B SI tenderness. No rash.  MS 5/5 BLE.  DTR absent BLE.  SLR neg B.  Good ROM   NEURO: A&O x3.  CN II-XII intact.   Off balance. PSYCH: normal mood. Good eye contact     Assessment & Plan:   Problem List Items Addressed This Visit   None Visit Diagnoses     Sciatica of right side    -  Primary      Sciatica R-flaring.  Will do PT.  Cont meloxicam.  Declined pred as not help and I agree.  Can do tylenol as well.  Will do flexeril at hs '5mg'$  and tramadol '50mg'$  tid prn.   If worse, no change, consdier MRI/injections.   No orders of the defined types were placed in this encounter.   Wellington Hampshire, MD

## 2022-03-10 NOTE — Patient Instructions (Signed)
It was very nice to see you today!  Referring to physical therapy.  Can do tylenol, meloxicam.  Sent in the flexeril and tramadol to pharmacy   PLEASE NOTE:  If you had any lab tests please let us know if you have not heard back within a few days. You may see your results on MyChart before we have a chance to review them but we will give you a call once they are reviewed by [name]. If we ordered any referrals today, please let us know if you have not heard from their office within the next week.   Please try these tips to maintain a healthy lifestyle:  Eat most of your calories during the day when you are active. Eliminate processed foods including packaged sweets (pies, cakes, cookies), reduce intake of potatoes, white bread, white pasta, and white rice. Look for whole grain options, oat flour or almond flour.  Each meal should contain half fruits/vegetables, one quarter protein, and one quarter carbs (no bigger than a computer mouse).  Cut down on sweet beverages. This includes juice, soda, and sweet tea. Also watch fruit intake, though this is a healthier sweet option, it still contains natural sugar! Limit to 3 servings daily.  Drink at least 1 glass of water with each meal and aim for at least 8 glasses per day  Exercise at least 150 minutes every week.

## 2022-03-11 ENCOUNTER — Ambulatory Visit: Payer: Medicare PPO | Admitting: Physical Therapy

## 2022-03-11 ENCOUNTER — Encounter: Payer: Self-pay | Admitting: Physical Therapy

## 2022-03-11 DIAGNOSIS — M6281 Muscle weakness (generalized): Secondary | ICD-10-CM | POA: Diagnosis not present

## 2022-03-11 DIAGNOSIS — M5416 Radiculopathy, lumbar region: Secondary | ICD-10-CM

## 2022-03-11 NOTE — Therapy (Signed)
OUTPATIENT PHYSICAL THERAPY THORACOLUMBAR EVALUATION   Patient Name: Miranda Mathis MRN: [id number] DOB:July 20, 1950, 71 y.o., female Today's Date: 03/11/2022   PT End of Session - 03/11/22 1121     Visit Number 1    Number of Visits 16    Date for PT Re-Evaluation 05/06/22    Authorization Type [employer]    PT Start Time 1106    PT Stop Time 1145    PT Time Calculation (min) 39 min    Activity Tolerance Patient tolerated treatment well    Behavior During Therapy WFL for tasks assessed/performed             Past Medical History:  Diagnosis Date   Allergic rhinitis    Allergy    seasonal allergies   Barrett's esophagus    Bladder spasms    Chronic back pain    DDD (degenerative disc disease), lumbar    on meds   Degenerative scoliosis    Depression    on meds   Diabetes mellitus without complication ([place])    on meds   Diabetic peripheral neuropathy (HCC)    Fibromyalgia    Full dentures    GAD (generalized anxiety disorder)    on meds   GERD (gastroesophageal reflux disease)    on meds   History of bladder stone    History of colon polyps    History of kidney stones    History of migraine    History of recurrent UTIs    History of sepsis    10-18-2018  bacterium-ecoli blood culture   Hydronephrosis    Hydronephrosis, bilateral    due to DM   Hyperlipidemia    on meds   Hypertension    on meds   Hypothyroidism 11/02/2013 dx   followed by pcp- on meds   IBS (irritable bowel syndrome)    Insomnia    Osteopenia    Osteopenia after menopause 06/06/2019   dexa 2019 T = -1.9, multiple site osteopenia; recheck 2021   Peripheral neuropathy    Psoriasis    Restless leg syndrome    Seborrheic dermatitis of scalp    Spondylolysis of lumbar region    Wears glasses    Past Surgical History:  Procedure Laterality Date   ABDOMINAL HYSTERECTOMY  1987   BALLOON DILATION  07/10/2011   Procedure: BALLOON DILATION;  Surgeon: Fredricka Bonine, MD;  Location:  [facility];  Service: Urology;  Laterality: Right;   CESAREAN SECTION  X2   CHOLECYSTECTOMY     COLONOSCOPY  2018   at [place] (results not availabe at this time)   CYSTO/ BILATERAL URETEROSCOPY / URETERAL BX'S/ BILATERAL URETERAL STENT PLACEMENT/ BLADDER STONE EXTRACTION  03-13-2011   CYSTOSCOPY W/ RETROGRADES  07/10/2011   Procedure: CYSTOSCOPY WITH RETROGRADE PYELOGRAM;  Surgeon: Fredricka Bonine, MD;  Location[facility];  Service: Urology;  Laterality: Right;   CYSTOSCOPY W/ URETERAL STENT PLACEMENT  07/10/2011   Procedure: CYSTOSCOPY WITH STENT REPLACEMENT;  Surgeon: Fredricka Bonine, MD;  Location: [facility];  Service: Urology;  Laterality: Right;   CYSTOSCOPY W/ URETERAL STENT PLACEMENT Bilateral 10/22/2018   Procedure: CYSTOSCOPY WITH RETROGRADE PYELOGRAM/URETERAL STENT PLACEMENT-BILATERAL;  Surgeon: Ardis Hughs, MD;  Location: [place];  Service: Urology;  Laterality: Bilateral;   CYSTOSCOPY W/ URETERAL STENT REMOVAL  07/10/2011   Procedure: CYSTOSCOPY WITH STENT REMOVAL;  Surgeon: Fredricka Bonine, MD;  Location[facility];  Service: Urology;  Laterality: Right;  CYSTOSCOPY WITH URETEROSCOPY AND STENT PLACEMENT Bilateral 11/29/2018   Procedure: CYSTOSCOPY WITH BILATERAL URETEROSCOPY AND STENT EXCHANGE/ WITH LEFT BRUSH BIOPSY;  Surgeon: Festus Aloe, MD;  Location: [facility];  Service: Urology;  Laterality: Bilateral;   CYSTOSCOPY/RETROGRADE/URETEROSCOPY Left 11/14/2019   Procedure: CYSTOSCOPY/RETROGRADE/URETEROSCOPY/ BIOPSY/ STENT PLACEMENT;  Surgeon: Festus Aloe, MD;  Location[facility];  Service: Urology;  Laterality: Left;   LUMBAR MICRODISCECTOMY  1990'S   L5 - S1   RIGHT FOOT SURG  2003   HEEL   RIGHT URETEROSCOPIC / URETERAL BX/ STENT PLACEMENT  04-17-2011   UPPER GASTROINTESTINAL ENDOSCOPY  2018   @ [facility] -results not available at this time   UPPER GI ENDOSCOPY      Patient Active Problem List   Diagnosis Date Noted   Lower extremity edema 04/15/2021   Depression, major, single episode, moderate ([place]) 07/11/2020   Osteopenia after menopause 06/06/2019   Bilateral hydronephrosis    Degenerative scoliosis 09/07/2018   Seborrheic dermatitis of scalp 12/02/2017   Psoriasis 12/02/2017   DM type 2 with diabetic peripheral neuropathy ([place]) 09/02/2016   Recurrent kidney stones 08/29/2015   History of Barrett's esophagus 08/29/2015   GAD (generalized anxiety disorder) 08/29/2015   GERD (gastroesophageal reflux disease) 08/29/2015   Irritable bowel syndrome with constipation 08/29/2015   Lumbosacral spondylosis with radiculopathy 08/29/2015   Barrett's esophagus with esophagitis 08/29/2015   Primary insomnia 10/25/2014   Acquired hypothyroidism 11/02/2013   Mixed hyperlipidemia 04/04/2012   Allergic rhinitis 10/11/2007   Fibromyalgia 10/11/2007   Chronic low back pain 10/11/2007    PCP: Billey Chang   REFERRING PROVIDER: Tawnya Crook   REFERRING DIAG: R sciatica   Rationale for Evaluation and Treatment Rehabilitation  THERAPY DIAG:  Radiculopathy, lumbar region  Muscle weakness (generalized)  ONSET DATE:   SUBJECTIVE:                                                                                                                                                                                           SUBJECTIVE STATEMENT: Pt states sciatica on R side, pain in posterior thigh down to foot. Described as feeling dead, more at end of the day. Standing/walking is worse, sitting too long also brings on pain. Can stand about 15 min;  Does state some baseline soreness in bil low back - longstanding from prior discectomy.  Pain for several months.  Has had a few falls recently, one last week, new scrapes on arm.  Stairs to get in house, does have 2 railing.  Pt is active at home, trying to do housework, painful/difficult, needs to sit and  rest a lot.  Some pain in R knee.    PERTINENT HISTORY:  DDD, Fibromyalgia, DM, Osteopenia   PAIN:  Are you having pain? Yes: NPRS scale: up to 9/10 Pain location: R LE Pain description: pain, weak, numb Aggravating factors: standing activity, sitting.  Relieving factors: none stated    PRECAUTIONS: Fall  WEIGHT BEARING RESTRICTIONS No  FALLS:  Has patient fallen in last 6 months? Yes. Number of falls 2   PLOF: Independent  PATIENT GOALS   decreased pain in Leg/back    OBJECTIVE:   DIAGNOSTIC FINDINGS:   COGNITION:  Overall cognitive status: Within functional limits for tasks assessed   PALPATION: Pain and tenderness in R Gr troch, piriformis, glute min, med, Some tenderness into ITB, Mild tenderness in bil low lumbar   LUMBAR ROM:   Active  AROM  eval  Flexion Mild limitation  Extension WFL  Right lateral flexion Mild limitation/ pain  Left lateral flexion WFL  Right rotation   Left rotation    (Blank rows = not tested)  LE ROM: mild limitation for hip rotation bil;    LOWER EXTREMITY MMT:   MMT Right eval Left eval  Hip flexion 4- 4-  Hip extension    Hip abduction 4 4  Hip adduction    Hip internal rotation 4 4  Hip external rotation 4 4  Knee flexion 4 4  Knee extension 4+ 4+  Ankle dorsiflexion    Ankle plantarflexion    Ankle inversion    Ankle eversion     (Blank rows = not tested)   LUMBAR SPECIAL TESTS:  Mild inc pain with SLR on R, increased HS tightness on R vs L,    GAIT:   TODAY'S TREATMENT  Ther ex; See below for HEP   PATIENT EDUCATION:  Education details: PT POC, Exam findings, HEP Person educated: Patient Education method: Explanation, Demonstration, Tactile cues, Verbal cues, and Handouts Education comprehension: verbalized understanding, returned demonstration, verbal cues required, tactile cues required, and needs further education   HOME EXERCISE PROGRAM: Access Code: [email or web address] URL:  [email or web address] Date: 03/11/2022 Prepared by: Lyndee Hensen  Exercises - Supine Piriformis Stretch Pulling Heel to Hip  - 2 x daily - 3 reps - 30 Seifer - Hooklying Single Knee to Chest  - 2 x daily - 3 reps - 30 [place] - Supine Lower Trunk Rotation  - 2 x daily - 10 reps - 5 Serio    ASSESSMENT:  CLINICAL IMPRESSION: Patient presents with primary complaint of increased pain in R LE. She has symptoms consistent with sciatica. She has numbness and weakness feeling in R LE with increased standing or sitting. She has much tenderness in R glute muscles, and mild weakness in hip muscles. She has decreased ability for and safety with standing activity, and has had 2 falls because of her leg weakness. Pt with decreased ability for full functional activities due to pain and deficit. Pt to benefit from skilled PT to improve deficits and pain.   OBJECTIVE IMPAIRMENTS decreased activity tolerance, decreased balance, decreased cognition, decreased knowledge of use of DME, decreased mobility, difficulty walking, decreased ROM, decreased strength, increased muscle spasms, improper body mechanics, and pain.   ACTIVITY LIMITATIONS carrying, lifting, bending, sitting, standing, squatting, stairs, transfers, and locomotion level  PARTICIPATION LIMITATIONS: meal prep, cleaning, laundry, shopping, community activity, and yard work  PERSONAL FACTORS  none  are also affecting patient's functional outcome.   REHAB POTENTIAL: Good  CLINICAL DECISION MAKING: Stable/uncomplicated  EVALUATION COMPLEXITY: Low   GOALS:  Goals reviewed with patient? Yes  SHORT TERM GOALS: Target date: 04/01/2022  Pt to be independent with initial HEP  Goal status: INITIAL  2.  Pt to report decrease in LE symptoms by 25 %.   Goal status: INITIAL    LONG TERM GOALS: Target date: 05/06/2022  Pt to be independent with final HEP  Goal status: INITIAL  2.  Pt to report decreased pain/symptoms in LEs to  0-2/10.    Goal status: INITIAL  3.  Pt to report ability for standing at least 30 min, without pain or weakness in LEs.   Goal status: INITIAL  4.  Pt to demo improved LE strength to at least 4+/5 to improve stability and gait.   Goal status: INITIAL     PLAN: PT FREQUENCY: 1-2x/week  PT DURATION: 8 weeks  PLANNED INTERVENTIONS: Therapeutic exercises, Therapeutic activity, Neuromuscular re-education, Balance training, Gait training, Patient/Family education, Self Care, Joint mobilization, Joint manipulation, Stair training, DME instructions, Aquatic Therapy, Dry Needling, Electrical stimulation, Spinal manipulation, Spinal mobilization, Cryotherapy, Moist heat, Taping, Traction, Ultrasound, Ionotophoresis '4mg'$ /ml Dexamethasone, and Manual therapy.  PLAN FOR NEXT SESSION:    Lyndee Hensen, PT, DPT 9:33 PM  03/11/22

## 2022-03-20 ENCOUNTER — Encounter: Payer: Self-pay | Admitting: Physical Therapy

## 2022-03-20 ENCOUNTER — Ambulatory Visit: Payer: Medicare PPO | Admitting: Physical Therapy

## 2022-03-20 DIAGNOSIS — M5416 Radiculopathy, lumbar region: Secondary | ICD-10-CM | POA: Diagnosis not present

## 2022-03-20 DIAGNOSIS — M6281 Muscle weakness (generalized): Secondary | ICD-10-CM

## 2022-03-20 NOTE — Therapy (Signed)
OUTPATIENT PHYSICAL THERAPY THORACOLUMBAR TREATMENT    Patient Name: Miranda Mathis MRN: [id number] DOB:[birth year], 71 y.o., female Today's Date: 03/20/2022   PT End of Session - 03/20/22 0925     Visit Number 2    Number of Visits 16    Date for PT Re-Evaluation 05/06/22    Authorization Type [employer]    PT Start Time 0930    PT Stop Time 7408    PT Time Calculation (min) 45 min    Activity Tolerance Patient tolerated treatment well    Behavior During Therapy WFL for tasks assessed/performed             Past Medical History:  Diagnosis Date   Allergic rhinitis    Allergy    seasonal allergies   Barrett's esophagus    Bladder spasms    Chronic back pain    DDD (degenerative disc disease), lumbar    on meds   Degenerative scoliosis    Depression    on meds   Diabetes mellitus without complication ([place])    on meds   Diabetic peripheral neuropathy (HCC)    Fibromyalgia    Full dentures    GAD (generalized anxiety disorder)    on meds   GERD (gastroesophageal reflux disease)    on meds   History of bladder stone    History of colon polyps    History of kidney stones    History of migraine    History of recurrent UTIs    History of sepsis    10-18-2018  bacterium-ecoli blood culture   Hydronephrosis    Hydronephrosis, bilateral    due to DM   Hyperlipidemia    on meds   Hypertension    on meds   Hypothyroidism 11/02/2013 dx   followed by pcp- on meds   IBS (irritable bowel syndrome)    Insomnia    Osteopenia    Osteopenia after menopause 06/06/2019   dexa 2019 T = -1.9, multiple site osteopenia; recheck 2021   Peripheral neuropathy    Psoriasis    Restless leg syndrome    Seborrheic dermatitis of scalp    Spondylolysis of lumbar region    Wears glasses    Past Surgical History:  Procedure Laterality Date   ABDOMINAL HYSTERECTOMY  1987   BALLOON DILATION  07/10/2011   Procedure: BALLOON DILATION;  Surgeon: Fredricka Bonine, MD;  Location:  [facility]  Service: Urology;  Laterality: Right;   CESAREAN SECTION  X2   CHOLECYSTECTOMY     COLONOSCOPY  2018   at [place] (results not availabe at this time)   CYSTO/ BILATERAL URETEROSCOPY / URETERAL BX'S/ BILATERAL URETERAL STENT PLACEMENT/ BLADDER STONE EXTRACTION  03-13-2011   CYSTOSCOPY W/ RETROGRADES  07/10/2011   Procedure: CYSTOSCOPY WITH RETROGRADE PYELOGRAM;  Surgeon: Fredricka Bonine, MD;  Location: [facility];  Service: Urology;  Laterality: Right;   CYSTOSCOPY W/ URETERAL STENT PLACEMENT  07/10/2011   Procedure: CYSTOSCOPY WITH STENT REPLACEMENT;  Surgeon: Fredricka Bonine, MD;  Location: [facility];  Service: Urology;  Laterality: Right;   CYSTOSCOPY W/ URETERAL STENT PLACEMENT Bilateral 10/22/2018   Procedure: CYSTOSCOPY WITH RETROGRADE PYELOGRAM/URETERAL STENT PLACEMENT-BILATERAL;  Surgeon: Ardis Hughs, MD;  Location: [place];  Service: Urology;  Laterality: Bilateral;   CYSTOSCOPY W/ URETERAL STENT REMOVAL  07/10/2011   Procedure: CYSTOSCOPY WITH STENT REMOVAL;  Surgeon: Fredricka Bonine, MD;  Location: [facility];  Service: Urology;  Laterality:  Right;   CYSTOSCOPY WITH URETEROSCOPY AND STENT PLACEMENT Bilateral 11/29/2018   Procedure: CYSTOSCOPY WITH BILATERAL URETEROSCOPY AND STENT EXCHANGE/ WITH LEFT BRUSH BIOPSY;  Surgeon: Festus Aloe, MD;  Location: [facility];  Service: Urology;  Laterality: Bilateral;   CYSTOSCOPY/RETROGRADE/URETEROSCOPY Left 11/14/2019   Procedure: CYSTOSCOPY/RETROGRADE/URETEROSCOPY/ BIOPSY/ STENT PLACEMENT;  Surgeon: Festus Aloe, MD;  Location: [facility];  Service: Urology;  Laterality: Left;   LUMBAR MICRODISCECTOMY  1990'S   L5 - S1   RIGHT FOOT SURG  2003   HEEL   RIGHT URETEROSCOPIC / URETERAL BX/ STENT PLACEMENT  04-17-2011   UPPER GASTROINTESTINAL ENDOSCOPY  2018   @ [facility] -results not available at this time   UPPER GI ENDOSCOPY      Patient Active Problem List   Diagnosis Date Noted   Lower extremity edema 04/15/2021   Depression, major, single episode, moderate ([place]) 07/11/2020   Osteopenia after menopause 06/06/2019   Bilateral hydronephrosis    Degenerative scoliosis 09/07/2018   Seborrheic dermatitis of scalp 12/02/2017   Psoriasis 12/02/2017   DM type 2 with diabetic peripheral neuropathy ([place]) 09/02/2016   Recurrent kidney stones 08/29/2015   History of Barrett's esophagus 08/29/2015   GAD (generalized anxiety disorder) 08/29/2015   GERD (gastroesophageal reflux disease) 08/29/2015   Irritable bowel syndrome with constipation 08/29/2015   Lumbosacral spondylosis with radiculopathy 08/29/2015   Barrett's esophagus with esophagitis 08/29/2015   Primary insomnia 10/25/2014   Acquired hypothyroidism 11/02/2013   Mixed hyperlipidemia 04/04/2012   Allergic rhinitis 10/11/2007   Fibromyalgia 10/11/2007   Chronic low back pain 10/11/2007    PCP: Billey Chang   REFERRING PROVIDER: Tawnya Crook   REFERRING DIAG: R sciatica   Rationale for Evaluation and Treatment Rehabilitation  THERAPY DIAG:  Radiculopathy, lumbar region  Muscle weakness (generalized)  ONSET DATE:   SUBJECTIVE:                                                                                                                                                                                           SUBJECTIVE STATEMENT  03/20/2022 Pt states no falls since last visit. Has been doing HEP. Most pain in R lateral hip and ITB. [employer] this week, for 1.5 hours, felt like her leg was going to give out.    Eval: Pt states sciatica on R side, pain in posterior thigh down to foot. Described as feeling dead, more at end of the day. Standing/walking is worse, sitting too long also brings on pain. Can stand about 15 min;  Does state some baseline soreness in bil low back - longstanding from prior discectomy.  Pain for  several  months.  Has had a few falls recently, one last week, new scrapes on arm.  Stairs to get in house, does have 2 railing.  Pt is active at home, trying to do housework, painful/difficult, needs to sit and rest a lot.  Some pain in R knee.    PERTINENT HISTORY:  DDD, Fibromyalgia, DM, Osteopenia   PAIN:  Are you having pain? Yes: NPRS scale: up to 9/10 Pain location: R LE Pain description: pain, weak, numb Aggravating factors: standing activity, sitting.  Relieving factors: none stated    PRECAUTIONS: Fall  WEIGHT BEARING RESTRICTIONS No  FALLS:  Has patient fallen in last 6 months? Yes. Number of falls 2   PLOF: Independent  PATIENT GOALS   decreased pain in Leg/back    OBJECTIVE:   DIAGNOSTIC FINDINGS:  no imaging    COGNITION:  Overall cognitive status: Within functional limits for tasks assessed   PALPATION: Pain and tenderness in R Gr troch, piriformis, glute min, med, Some tenderness into ITB, Mild tenderness in bil low lumbar   LUMBAR ROM:   Active  AROM  eval  Flexion Mild limitation  Extension WFL  Right lateral flexion Mild limitation/ pain  Left lateral flexion WFL  Right rotation   Left rotation    (Blank rows = not tested)  LE ROM: mild limitation for hip rotation bil;    LOWER EXTREMITY MMT:   MMT Right eval Left eval  Hip flexion 4- 4-  Hip extension    Hip abduction 4 4  Hip adduction    Hip internal rotation 4 4  Hip external rotation 4 4  Knee flexion 4 4  Knee extension 4+ 4+  Ankle dorsiflexion    Ankle plantarflexion    Ankle inversion    Ankle eversion     (Blank rows = not tested)   LUMBAR SPECIAL TESTS:  Mild inc pain with SLR on R, increased HS tightness on R vs L,      TODAY'S TREATMENT  03/20/2022 Therapeutic Exercise: Aerobic: Supine: SLR 3x 5 on R, 2x10 on L; Bridging x10;  Seated: S/L:  hip abd 2x10 on R, Clams x 15 on R;  Standing: Stretches: Seated HSS 30 sec x 4 bil;  Supine mod fig 4 piriformis 30  sec x 3 bil;  Neuromuscular Re-education: Manual Therapy: Manual HSS on R, with ankle pumps x 20;  long leg distraction for lumbar pump; Percussion gun to R lateral hip and ITB;  Self Care:    PATIENT EDUCATION:  Education details: Updated and reviewed HEP Person educated: Patient Education method: Explanation, Demonstration, Tactile cues, Verbal cues, and Handouts Education comprehension: verbalized understanding, returned demonstration, verbal cues required, tactile cues required, and needs further education   HOME EXERCISE PROGRAM: Access Code: [email or web address]     ASSESSMENT:  CLINICAL IMPRESSION: 03/20/2022 Noted nerve/muscle tension with LAQ and HSS today, decreased after repeated motions, and also given for HEP.  Pt with good tolerance for light strengthening today, weakest with hip flexion/SLR position. Noted pain relief with percussion to lateral hip. Also noted soreness in posterior knee on R, likely bakers cyst from palpation. Pts feeling of instability and leg giving way seems to be more from knee pain today vs hip/back pain. Will continue to progress strengthening as tolerated.    Eval: Patient presents with primary complaint of increased pain in R LE. She has symptoms consistent with sciatica. She has numbness and weakness feeling in R LE with increased standing or sitting. She  has much tenderness in R glute muscles, and mild weakness in hip muscles. She has decreased ability for and safety with standing activity, and has had 2 falls because of her leg weakness. Pt with decreased ability for full functional activities due to pain and deficit. Pt to benefit from skilled PT to improve deficits and pain.   OBJECTIVE IMPAIRMENTS decreased activity tolerance, decreased balance, decreased cognition, decreased knowledge of use of DME, decreased mobility, difficulty walking, decreased ROM, decreased strength, increased muscle spasms, improper body mechanics, and pain.   ACTIVITY  LIMITATIONS carrying, lifting, bending, sitting, standing, squatting, stairs, transfers, and locomotion level  PARTICIPATION LIMITATIONS: meal prep, cleaning, laundry, shopping, community activity, and yard work  PERSONAL FACTORS  none  are also affecting patient's functional outcome.   REHAB POTENTIAL: Good  CLINICAL DECISION MAKING: Stable/uncomplicated  EVALUATION COMPLEXITY: Low   GOALS: Goals reviewed with patient? Yes  SHORT TERM GOALS: Target date: 04/01/2022  Pt to be independent with initial HEP  Goal status: INITIAL  2.  Pt to report decrease in LE symptoms by 25 %.   Goal status: INITIAL    LONG TERM GOALS: Target date: 05/06/2022  Pt to be independent with final HEP  Goal status: INITIAL  2.  Pt to report decreased pain/symptoms in LEs to 0-2/10.    Goal status: INITIAL  3.  Pt to report ability for standing at least 30 min, without pain or weakness in LEs.   Goal status: INITIAL  4.  Pt to demo improved LE strength to at least 4+/5 to improve stability and gait.   Goal status: INITIAL     PLAN: PT FREQUENCY: 1-2x/week  PT DURATION: 8 weeks  PLANNED INTERVENTIONS: Therapeutic exercises, Therapeutic activity, Neuromuscular re-education, Balance training, Gait training, Patient/Family education, Self Care, Joint mobilization, Joint manipulation, Stair training, DME instructions, Aquatic Therapy, Dry Needling, Electrical stimulation, Spinal manipulation, Spinal mobilization, Cryotherapy, Moist heat, Taping, Traction, Ultrasound, Ionotophoresis '4mg'$ /ml Dexamethasone, and Manual therapy.  PLAN FOR NEXT SESSION:    Lyndee Hensen, PT, DPT 9:26 AM  03/20/22

## 2022-03-31 ENCOUNTER — Encounter: Payer: Medicare PPO | Admitting: Physical Therapy

## 2022-04-02 ENCOUNTER — Encounter: Payer: Medicare PPO | Admitting: Physical Therapy

## 2022-04-03 ENCOUNTER — Encounter: Payer: Self-pay | Admitting: Physical Therapy

## 2022-04-03 ENCOUNTER — Ambulatory Visit: Payer: Medicare PPO | Admitting: Physical Therapy

## 2022-04-03 DIAGNOSIS — M6281 Muscle weakness (generalized): Secondary | ICD-10-CM | POA: Diagnosis not present

## 2022-04-03 DIAGNOSIS — M5416 Radiculopathy, lumbar region: Secondary | ICD-10-CM

## 2022-04-03 NOTE — Therapy (Signed)
OUTPATIENT PHYSICAL THERAPY THORACOLUMBAR TREATMENT    Patient Name: Miranda Mathis MRN: [id number] DOB:1951-02-26, 71 y.o., female Today's Date: 04/03/2022     Past Medical History:  Diagnosis Date   Allergic rhinitis    Allergy    seasonal allergies   Barrett's esophagus    Bladder spasms    Chronic back pain    DDD (degenerative disc disease), lumbar    on meds   Degenerative scoliosis    Depression    on meds   Diabetes mellitus without complication (HCC)    on meds   Diabetic peripheral neuropathy (HCC)    Fibromyalgia    Full dentures    GAD (generalized anxiety disorder)    on meds   GERD (gastroesophageal reflux disease)    on meds   History of bladder stone    History of colon polyps    History of kidney stones    History of migraine    History of recurrent UTIs    History of sepsis    10-18-2018  bacterium-ecoli blood culture   Hydronephrosis    Hydronephrosis, bilateral    due to DM   Hyperlipidemia    on meds   Hypertension    on meds   Hypothyroidism 11/02/2013 dx   followed by pcp- on meds   IBS (irritable bowel syndrome)    Insomnia    Osteopenia    Osteopenia after menopause 06/06/2019   dexa 2019 T = -1.9, multiple site osteopenia; recheck 2021   Peripheral neuropathy    Psoriasis    Restless leg syndrome    Seborrheic dermatitis of scalp    Spondylolysis of lumbar region    Wears glasses    Past Surgical History:  Procedure Laterality Date   ABDOMINAL HYSTERECTOMY  1987   BALLOON DILATION  07/10/2011   Procedure: BALLOON DILATION;  Surgeon: Fredricka Bonine, MD;  Location: [facility];  Service: Urology;  Laterality: Right;   CESAREAN SECTION  X2   CHOLECYSTECTOMY     COLONOSCOPY  2018   at [place] (results not availabe at this time)   CYSTO/ BILATERAL URETEROSCOPY / URETERAL BX'S/ BILATERAL URETERAL STENT PLACEMENT/ BLADDER STONE EXTRACTION  03-13-2011   CYSTOSCOPY W/ RETROGRADES  07/10/2011   Procedure:  CYSTOSCOPY WITH RETROGRADE PYELOGRAM;  Surgeon: Fredricka Bonine, MD;  Location: [facility];  Service: Urology;  Laterality: Right;   CYSTOSCOPY W/ URETERAL STENT PLACEMENT  07/10/2011   Procedure: CYSTOSCOPY WITH STENT REPLACEMENT;  Surgeon: Fredricka Bonine, MD;  Location: [facility];  Service: Urology;  Laterality: Right;   CYSTOSCOPY W/ URETERAL STENT PLACEMENT Bilateral 10/22/2018   Procedure: CYSTOSCOPY WITH RETROGRADE PYELOGRAM/URETERAL STENT PLACEMENT-BILATERAL;  Surgeon: Ardis Hughs, MD;  Location: [place];  Service: Urology;  Laterality: Bilateral;   CYSTOSCOPY W/ URETERAL STENT REMOVAL  07/10/2011   Procedure: CYSTOSCOPY WITH STENT REMOVAL;  Surgeon: Fredricka Bonine, MD;  Location: [facility];  Service: Urology;  Laterality: Right;   CYSTOSCOPY WITH URETEROSCOPY AND STENT PLACEMENT Bilateral 11/29/2018   Procedure: CYSTOSCOPY WITH BILATERAL URETEROSCOPY AND STENT EXCHANGE/ WITH LEFT BRUSH BIOPSY;  Surgeon: Festus Aloe, MD;  Location: [facility];  Service: Urology;  Laterality: Bilateral;   CYSTOSCOPY/RETROGRADE/URETEROSCOPY Left 11/14/2019   Procedure: CYSTOSCOPY/RETROGRADE/URETEROSCOPY/ BIOPSY/ STENT PLACEMENT;  Surgeon: Festus Aloe, MD;  Location: [facility];  Service: Urology;  Laterality: Left;   LUMBAR MICRODISCECTOMY  1990'S   L5 - S1   RIGHT FOOT SURG  2003  HEEL   RIGHT URETEROSCOPIC / URETERAL BX/ STENT PLACEMENT  04-17-2011   UPPER GASTROINTESTINAL ENDOSCOPY  2018   @ [facility] -results not available at this time   UPPER GI ENDOSCOPY     Patient Active Problem List   Diagnosis Date Noted   Lower extremity edema 04/15/2021   Depression, major, single episode, moderate ([place]) 07/11/2020   Osteopenia after menopause 06/06/2019   Bilateral hydronephrosis    Degenerative scoliosis 09/07/2018   Seborrheic dermatitis of scalp 12/02/2017   Psoriasis 12/02/2017   DM type 2 with diabetic  peripheral neuropathy ([place]) 09/02/2016   Recurrent kidney stones 08/29/2015   History of Barrett's esophagus 08/29/2015   GAD (generalized anxiety disorder) 08/29/2015   GERD (gastroesophageal reflux disease) 08/29/2015   Irritable bowel syndrome with constipation 08/29/2015   Lumbosacral spondylosis with radiculopathy 08/29/2015   Barrett's esophagus with esophagitis 08/29/2015   Primary insomnia 10/25/2014   Acquired hypothyroidism 11/02/2013   Mixed hyperlipidemia 04/04/2012   Allergic rhinitis 10/11/2007   Fibromyalgia 10/11/2007   Chronic low back pain 10/11/2007    PCP: Billey Chang   REFERRING PROVIDER: Tawnya Crook   REFERRING DIAG: R sciatica   Rationale for Evaluation and Treatment Rehabilitation  THERAPY DIAG:  No diagnosis found.  ONSET DATE:   SUBJECTIVE:                                                                                                                                                                                           SUBJECTIVE STATEMENT  04/03/2022 Pt states mild soreness in low back/arthritis, but LE pain/sciatic pain significantly improved, has had minimal pain. Has mild pain in front of knee. Posterior knee pain improved.   Thinks her sugar was low this am, ate something about 30 min ago, but not a full breakfast. Denies dizziness or other symptoms.    Eval: Pt states sciatica on R side, pain in posterior thigh down to foot. Described as feeling dead, more at end of the day. Standing/walking is worse, sitting too long also brings on pain. Can stand about 15 min;  Does state some baseline soreness in bil low back - longstanding from prior discectomy.  Pain for several months.  Has had a few falls recently, one last week, new scrapes on arm.  Stairs to get in house, does have 2 railing.  Pt is active at home, trying to do housework, painful/difficult, needs to sit and rest a lot.  Some pain in R knee.    PERTINENT HISTORY:  DDD,  Fibromyalgia, DM, Osteopenia   PAIN:  Are you having pain? Yes: NPRS scale: up to 0-1/10 Pain  location: R LE Pain description: pain, weak, numb Aggravating factors: standing activity, sitting.  Relieving factors: none stated    PRECAUTIONS: Fall  WEIGHT BEARING RESTRICTIONS No  FALLS:  Has patient fallen in last 6 months? Yes. Number of falls 2   PLOF: Independent  PATIENT GOALS   decreased pain in Leg/back    OBJECTIVE:   DIAGNOSTIC FINDINGS:  no imaging    COGNITION:  Overall cognitive status: Within functional limits for tasks assessed   PALPATION: Pain and tenderness in R Gr troch, piriformis, glute min, med, Some tenderness into ITB, Mild tenderness in bil low lumbar   LUMBAR ROM:   Active  AROM  eval  Flexion Mild limitation  Extension WFL  Right lateral flexion Mild limitation/ pain  Left lateral flexion WFL  Right rotation   Left rotation    (Blank rows = not tested)  LE ROM: mild limitation for hip rotation bil;    LOWER EXTREMITY MMT:   MMT Right eval Left eval  Hip flexion 4- 4-  Hip extension    Hip abduction 4 4  Hip adduction    Hip internal rotation 4 4  Hip external rotation 4 4  Knee flexion 4 4  Knee extension 4+ 4+  Ankle dorsiflexion    Ankle plantarflexion    Ankle inversion    Ankle eversion     (Blank rows = not tested)   LUMBAR SPECIAL TESTS:  Mild inc pain with SLR on R, increased HS tightness on R vs L,    TODAY'S TREATMENT   04/03/22: Therapeutic Exercise: Aerobic: Supine: SLR 2 x 10 bil;  Bridging 2 x10;  Seated: LAQ 2lb x 20 bil;  sit to stand from mat table x 10;  S/L:  hip abd 2x10 on R, Clams x 15 on R;  Standing: Step up 6 in x 10 on R, 2 hand rails.  Stretches: Seated HSS 30 sec x 4 bil;  Seated and Supine mod fig 4 piriformis 30 sec x 3 bil;  HSS on R, with ankle pumps x 20; Neuromuscular Re-education: Manual Therapy:   long leg distraction for lumbar pump; Percussion gun to R lateral hip and ITB;   Self Care:    03/20/2022 Therapeutic Exercise: Aerobic: Supine: SLR 3x 5 on R, 2x10 on L; Bridging x10;  Seated: LAQ, S/L:  hip abd 2x10 on R, Clams x 15 on R;  Standing: Stretches: Seated HSS 30 sec x 4 bil;  Supine mod fig 4 piriformis 30 sec x 3 bil;  Neuromuscular Re-education: Manual Therapy: Manual HSS on R, with ankle pumps x 20;  long leg distraction for lumbar pump; Percussion gun to R lateral hip and ITB;  Self Care:    PATIENT EDUCATION:  Education details: Updated and reviewed HEP Person educated: Patient Education method: Explanation, Demonstration, Tactile cues, Verbal cues, and Handouts Education comprehension: verbalized understanding, returned demonstration, verbal cues required, tactile cues required, and needs further education   HOME EXERCISE PROGRAM: Access Code: [email or web address] - step ups on R     ASSESSMENT:  CLINICAL IMPRESSION: 04/03/2022 Pt with significant improvement of pain in leg. Also with improved ability and strength in R LE today with LAQ  and SLR, with ability for increased reps. Pt progressing well, plan to progress strength as tolerated.  Pt with other symptoms during visit, states she feels better after a few minutes, no symptoms upon leaving.  She states she will be able to attend 1 more visit, then will do HEP,  due to co-pay.   Eval: Patient presents with primary complaint of increased pain in R LE. She has symptoms consistent with sciatica. She has numbness and weakness feeling in R LE with increased standing or sitting. She has much tenderness in R glute muscles, and mild weakness in hip muscles. She has decreased ability for and safety with standing activity, and has had 2 falls because of her leg weakness. Pt with decreased ability for full functional activities due to pain and deficit. Pt to benefit from skilled PT to improve deficits and pain.   OBJECTIVE IMPAIRMENTS decreased activity tolerance, decreased balance, decreased cognition,  decreased knowledge of use of DME, decreased mobility, difficulty walking, decreased ROM, decreased strength, increased muscle spasms, improper body mechanics, and pain.   ACTIVITY LIMITATIONS carrying, lifting, bending, sitting, standing, squatting, stairs, transfers, and locomotion level  PARTICIPATION LIMITATIONS: meal prep, cleaning, laundry, shopping, community activity, and yard work  PERSONAL FACTORS  none  are also affecting patient's functional outcome.   REHAB POTENTIAL: Good  CLINICAL DECISION MAKING: Stable/uncomplicated  EVALUATION COMPLEXITY: Low   GOALS: Goals reviewed with patient? Yes  SHORT TERM GOALS: Target date: 04/01/2022  Pt to be independent with initial HEP  Goal status: INITIAL  2.  Pt to report decrease in LE symptoms by 25 %.   Goal status: INITIAL    LONG TERM GOALS: Target date: 05/06/2022  Pt to be independent with final HEP  Goal status: INITIAL  2.  Pt to report decreased pain/symptoms in LEs to 0-2/10.    Goal status: INITIAL  3.  Pt to report ability for standing at least 30 min, without pain or weakness in LEs.   Goal status: INITIAL  4.  Pt to demo improved LE strength to at least 4+/5 to improve stability and gait.   Goal status: INITIAL     PLAN: PT FREQUENCY: 1-2x/week  PT DURATION: 8 weeks  PLANNED INTERVENTIONS: Therapeutic exercises, Therapeutic activity, Neuromuscular re-education, Balance training, Gait training, Patient/Family education, Self Care, Joint mobilization, Joint manipulation, Stair training, DME instructions, Aquatic Therapy, Dry Needling, Electrical stimulation, Spinal manipulation, Spinal mobilization, Cryotherapy, Moist heat, Taping, Traction, Ultrasound, Ionotophoresis '4mg'$ /ml Dexamethasone, and Manual therapy.  PLAN FOR NEXT SESSION:    Lyndee Hensen, PT, DPT 10:22 AM  04/03/22

## 2022-04-07 ENCOUNTER — Encounter: Payer: Medicare PPO | Admitting: Physical Therapy

## 2022-04-09 ENCOUNTER — Encounter: Payer: Medicare PPO | Admitting: Physical Therapy

## 2022-04-09 ENCOUNTER — Other Ambulatory Visit: Payer: Self-pay | Admitting: Family Medicine

## 2022-04-10 ENCOUNTER — Ambulatory Visit: Payer: Medicare PPO | Admitting: Physical Therapy

## 2022-04-10 ENCOUNTER — Encounter: Payer: Self-pay | Admitting: Physical Therapy

## 2022-04-10 DIAGNOSIS — M5416 Radiculopathy, lumbar region: Secondary | ICD-10-CM | POA: Diagnosis not present

## 2022-04-10 DIAGNOSIS — M6281 Muscle weakness (generalized): Secondary | ICD-10-CM

## 2022-04-10 NOTE — Therapy (Unsigned)
OUTPATIENT PHYSICAL THERAPY THORACOLUMBAR TREATMENT    Patient Name: Miranda Mathis MRN: [id number] DOB:December 27, 1950, 71 y.o., female Today's Date: 04/10/2022   PT End of Session - 04/12/22 2047     Visit Number 4    Number of Visits 16    Date for PT Re-Evaluation 05/06/22    Authorization Type [employer]    PT Start Time 0933    PT Stop Time 6222    PT Time Calculation (min) 42 min    Activity Tolerance Patient tolerated treatment well    Behavior During Therapy WFL for tasks assessed/performed              Past Medical History:  Diagnosis Date   Allergic rhinitis    Allergy    seasonal allergies   Barrett's esophagus    Bladder spasms    Chronic back pain    DDD (degenerative disc disease), lumbar    on meds   Degenerative scoliosis    Depression    on meds   Diabetes mellitus without complication ([place])    on meds   Diabetic peripheral neuropathy (HCC)    Fibromyalgia    Full dentures    GAD (generalized anxiety disorder)    on meds   GERD (gastroesophageal reflux disease)    on meds   History of bladder stone    History of colon polyps    History of kidney stones    History of migraine    History of recurrent UTIs    History of sepsis    10-18-2018  bacterium-ecoli blood culture   Hydronephrosis    Hydronephrosis, bilateral    due to DM   Hyperlipidemia    on meds   Hypertension    on meds   Hypothyroidism 11/02/2013 dx   followed by pcp- on meds   IBS (irritable bowel syndrome)    Insomnia    Osteopenia    Osteopenia after menopause 06/06/2019   dexa 2019 T = -1.9, multiple site osteopenia; recheck 2021   Peripheral neuropathy    Psoriasis    Restless leg syndrome    Seborrheic dermatitis of scalp    Spondylolysis of lumbar region    Wears glasses    Past Surgical History:  Procedure Laterality Date   ABDOMINAL HYSTERECTOMY  1987   BALLOON DILATION  07/10/2011   Procedure: BALLOON DILATION;  Surgeon: Fredricka Bonine, MD;  Location:  [facility];  Service: Urology;  Laterality: Right;   CESAREAN SECTION  X2   CHOLECYSTECTOMY     COLONOSCOPY  2018   at [place] (results not availabe at this time)   CYSTO/ BILATERAL URETEROSCOPY / URETERAL BX'S/ BILATERAL URETERAL STENT PLACEMENT/ BLADDER STONE EXTRACTION  03-13-2011   CYSTOSCOPY W/ RETROGRADES  07/10/2011   Procedure: CYSTOSCOPY WITH RETROGRADE PYELOGRAM;  Surgeon: Fredricka Bonine, MD;  Location: [facility];  Service: Urology;  Laterality: Right;   CYSTOSCOPY W/ URETERAL STENT PLACEMENT  07/10/2011   Procedure: CYSTOSCOPY WITH STENT REPLACEMENT;  Surgeon: Fredricka Bonine, MD;  Location: [facility];  Service: Urology;  Laterality: Right;   CYSTOSCOPY W/ URETERAL STENT PLACEMENT Bilateral 10/22/2018   Procedure: CYSTOSCOPY WITH RETROGRADE PYELOGRAM/URETERAL STENT PLACEMENT-BILATERAL;  Surgeon: Ardis Hughs, MD;  Location: [place];  Service: Urology;  Laterality: Bilateral;   CYSTOSCOPY W/ URETERAL STENT REMOVAL  07/10/2011   Procedure: CYSTOSCOPY WITH STENT REMOVAL;  Surgeon: Fredricka Bonine, MD;  Location: [facility];  Service: Urology;  Laterality: Right;   CYSTOSCOPY WITH URETEROSCOPY AND STENT PLACEMENT Bilateral 11/29/2018   Procedure: CYSTOSCOPY WITH BILATERAL URETEROSCOPY AND STENT EXCHANGE/ WITH LEFT BRUSH BIOPSY;  Surgeon: Festus Aloe, MD;  [facility];  Service: Urology;  Laterality: Bilateral;   CYSTOSCOPY/RETROGRADE/URETEROSCOPY Left 11/14/2019   Procedure: CYSTOSCOPY/RETROGRADE/URETEROSCOPY/ BIOPSY/ STENT PLACEMENT;  Surgeon: Festus Aloe, MD;  Location: [facility];  Service: Urology;  Laterality: Left;   LUMBAR MICRODISCECTOMY  1990'S   L5 - S1   RIGHT FOOT SURG  2003   HEEL   RIGHT URETEROSCOPIC / URETERAL BX/ STENT PLACEMENT  04-17-2011   UPPER GASTROINTESTINAL ENDOSCOPY  2018   @ [facility] -results not available at this time   UPPER GI ENDOSCOPY      Patient Active Problem List   Diagnosis Date Noted   Lower extremity edema 04/15/2021   Depression, major, single episode, moderate ([place]) 07/11/2020   Osteopenia after menopause 06/06/2019   Bilateral hydronephrosis    Degenerative scoliosis 09/07/2018   Seborrheic dermatitis of scalp 12/02/2017   Psoriasis 12/02/2017   DM type 2 with diabetic peripheral neuropathy ([place]) 09/02/2016   Recurrent kidney stones 08/29/2015   History of Barrett's esophagus 08/29/2015   GAD (generalized anxiety disorder) 08/29/2015   GERD (gastroesophageal reflux disease) 08/29/2015   Irritable bowel syndrome with constipation 08/29/2015   Lumbosacral spondylosis with radiculopathy 08/29/2015   Barrett's esophagus with esophagitis 08/29/2015   Primary insomnia 10/25/2014   Acquired hypothyroidism 11/02/2013   Mixed hyperlipidemia 04/04/2012   Allergic rhinitis 10/11/2007   Fibromyalgia 10/11/2007   Chronic low back pain 10/11/2007    PCP: Billey Chang   REFERRING PROVIDER: Tawnya Crook   REFERRING DIAG: R sciatica   Rationale for Evaluation and Treatment Rehabilitation  THERAPY DIAG:  Radiculopathy, lumbar region  Muscle weakness (generalized)  ONSET DATE:   SUBJECTIVE:                                                                                                                                                                                           SUBJECTIVE STATEMENT  04/10/2022 Still having pain behind knee. States R knee still buckling and she has near falls, at least 1x/wk. Also still having some weakness in both legs, were feeling like "jello" this am when she woke up, and also feel like that if she stands for a while.    Eval: Pt states sciatica on R side, pain in posterior thigh down to foot. Described as feeling dead, more at end of the day. Standing/walking is worse, sitting too long also brings on pain. Can stand about 15 min;  Does state some baseline soreness  in bil  low back - longstanding from prior discectomy.  Pain for several months.  Has had a few falls recently, one last week, new scrapes on arm.  Stairs to get in house, does have 2 railing.  Pt is active at home, trying to do housework, painful/difficult, needs to sit and rest a lot.  Some pain in R knee.    PERTINENT HISTORY:  DDD, Fibromyalgia, DM, Osteopenia   PAIN:  Are you having pain? Yes: NPRS scale: up to 0-1/10 Pain location: R LE Pain description: pain, weak, numb Aggravating factors: standing activity, sitting.  Relieving factors: none stated    PRECAUTIONS: Fall  WEIGHT BEARING RESTRICTIONS No  FALLS:  Has patient fallen in last 6 months? Yes. Number of falls 2   PLOF: Independent  PATIENT GOALS   decreased pain in Leg/back    OBJECTIVE: updated 10/20  DIAGNOSTIC FINDINGS:  no imaging    COGNITION:  Overall cognitive status: Within functional limits for tasks assessed   PALPATION:   LUMBAR ROM:   Active  AROM    Flexion Mild limitation  Extension WFL  Right lateral flexion Mild limitation/ pain  Left lateral flexion WFL  Right rotation   Left rotation    (Blank rows = not tested)  LE ROM: mild limitation for hip rotation bil;    LOWER EXTREMITY MMT:   MMT Right  Left   Hip flexion 4 4  Hip extension    Hip abduction 4 4  Hip adduction    Hip internal rotation 4 4  Hip external rotation 4 4  Knee flexion 5 5  Knee extension 4+ 4+  Ankle dorsiflexion    Ankle plantarflexion    Ankle inversion    Ankle eversion     (Blank rows = not tested)   LUMBAR SPECIAL TESTS:   TODAY'S TREATMENT   04/10/22: Therapeutic Exercise: Aerobic: Supine: SLR 2 x 10 bil;  Bridging 2 x10;  Seated: LAQ 2lb x 20 bil;  sit to stand from mat table x 10;  S/L:  hip abd 2x10 on R, Clams x 15 on R;  Standing: Step up 6 in x 10 on R, 2 hand rails, HR x 10; marching x 15;  Stretches: Seated HSS 30 sec x 4 bil;  Supine mod fig 4 piriformis 30 sec x 3 bil;    Neuromuscular Re-education: Manual Therapy:     Self Care:    PATIENT EDUCATION:  Education details: Updated and reviewed HEP Person educated: Patient Education method: Explanation, Demonstration, Tactile cues, Verbal cues, and Handouts Education comprehension: verbalized understanding, returned demonstration, verbal cues required, tactile cues required, and needs further education   HOME EXERCISE PROGRAM: Access Code: [email or web address]   ASSESSMENT:  CLINICAL IMPRESSION: 04/10/2022 Pt requests d/c due to co-pay. Her LE pain is significantly improved. She is doing very well with ther ex and HEP. Discussed importance of continuing LE strengthening, and mobility for low back. She continues to have symptoms of weakness in both LEs , when getting up from sitting for a while, or with standing for several minutes. She has fallen from leg weakness. She has also fallen from R knee giving out on her. She has some pain in R knee, and has seen MD previously for this. Discussed need for f/u with Dr to further assess LE weakness stemming from knee or back. Pt states understanding.     Eval: Patient presents with primary complaint of increased pain in R LE. She has symptoms consistent with  sciatica. She has numbness and weakness feeling in R LE with increased standing or sitting. She has much tenderness in R glute muscles, and mild weakness in hip muscles. She has decreased ability for and safety with standing activity, and has had 2 falls because of her leg weakness. Pt with decreased ability for full functional activities due to pain and deficit. Pt to benefit from skilled PT to improve deficits and pain.   OBJECTIVE IMPAIRMENTS decreased activity tolerance, decreased balance, decreased cognition, decreased knowledge of use of DME, decreased mobility, difficulty walking, decreased ROM, decreased strength, increased muscle spasms, improper body mechanics, and pain.   ACTIVITY LIMITATIONS carrying, lifting,  bending, sitting, standing, squatting, stairs, transfers, and locomotion level  PARTICIPATION LIMITATIONS: meal prep, cleaning, laundry, shopping, community activity, and yard work  PERSONAL FACTORS  none  are also affecting patient's functional outcome.   REHAB POTENTIAL: Good  CLINICAL DECISION MAKING: Stable/uncomplicated  EVALUATION COMPLEXITY: Low   GOALS: Goals reviewed with patient? Yes  SHORT TERM GOALS: Target date: 04/01/2022  Pt to be independent with initial HEP  Goal status: MET  2.  Pt to report decrease in LE symptoms by 25 %.   Goal status: MET    LONG TERM GOALS: Target date: 05/06/2022  Pt to be independent with final HEP  Goal status: MET  2.  Pt to report decreased pain/symptoms in LEs to 0-2/10.    Goal status: MET  3.  Pt to report ability for standing at least 30 min, without pain or weakness in LEs.   Goal status: IN PROGRESS  4.  Pt to demo improved LE strength to at least 4+/5 to improve stability and gait.   Goal status: IN PROGRESS     PLAN: PT FREQUENCY: 1-2x/week  PT DURATION: 8 weeks  PLANNED INTERVENTIONS: Therapeutic exercises, Therapeutic activity, Neuromuscular re-education, Balance training, Gait training, Patient/Family education, Self Care, Joint mobilization, Joint manipulation, Stair training, DME instructions, Aquatic Therapy, Dry Needling, Electrical stimulation, Spinal manipulation, Spinal mobilization, Cryotherapy, Moist heat, Taping, Traction, Ultrasound, Ionotophoresis 50m/ml Dexamethasone, and Manual therapy.  PLAN FOR NEXT SESSION:    LLyndee Hensen PT, DPT 8:49 PM  04/12/22    PHYSICAL THERAPY DISCHARGE SUMMARY  Visits from Start of Care: 4 Plan: Patient agrees to discharge.  Patient goals were not met. Patient is being discharged due to -pt requests d/c due to co pay.    LLyndee Hensen PT, DPT 8:59 PM  04/12/22

## 2022-04-12 ENCOUNTER — Encounter: Payer: Self-pay | Admitting: Physical Therapy

## 2022-04-14 ENCOUNTER — Encounter: Payer: Medicare PPO | Admitting: Physical Therapy

## 2022-04-16 ENCOUNTER — Encounter: Payer: Medicare PPO | Admitting: Physical Therapy

## 2022-04-17 ENCOUNTER — Encounter: Payer: Medicare PPO | Admitting: Physical Therapy

## 2022-04-30 ENCOUNTER — Encounter: Payer: Self-pay | Admitting: Family Medicine

## 2022-04-30 ENCOUNTER — Ambulatory Visit (INDEPENDENT_AMBULATORY_CARE_PROVIDER_SITE_OTHER): Payer: Medicare PPO | Admitting: Family Medicine

## 2022-04-30 VITALS — BP 108/78 | HR 73 | Temp 98.7°F | Ht 63.0 in | Wt 216.0 lb

## 2022-04-30 DIAGNOSIS — L304 Erythema intertrigo: Secondary | ICD-10-CM | POA: Diagnosis not present

## 2022-04-30 DIAGNOSIS — F321 Major depressive disorder, single episode, moderate: Secondary | ICD-10-CM | POA: Diagnosis not present

## 2022-04-30 DIAGNOSIS — L409 Psoriasis, unspecified: Secondary | ICD-10-CM | POA: Diagnosis not present

## 2022-04-30 DIAGNOSIS — E1142 Type 2 diabetes mellitus with diabetic polyneuropathy: Secondary | ICD-10-CM | POA: Diagnosis not present

## 2022-04-30 LAB — POCT GLYCOSYLATED HEMOGLOBIN (HGB A1C): Hemoglobin A1C: 6.5 % — AB (ref 4.0–5.6)

## 2022-04-30 MED ORDER — NYSTATIN 100000 UNIT/GM EX CREA: 1.0000 | TOPICAL_CREAM | Freq: Two times a day (BID) | CUTANEOUS | 5 refills | Status: DC

## 2022-04-30 NOTE — Patient Instructions (Signed)
Please return in 3 months for your annual complete physical; please come fasting. You may also schedule your annual medicare visit with our nurse educator and you mammogram is due in December.   If you have any questions or concerns, please don't hesitate to send me a message via MyChart or call the office at [phone]. Thank you for visiting with [name] today! It's our pleasure caring for you.

## 2022-04-30 NOTE — Progress Notes (Signed)
Subjective  CC:  Chief Complaint  Patient presents with   Diabetes    Pt here for F/U with DM    HPI: Miranda Mathis is a 71 y.o. female who presents to the office today for follow up of diabetes and problems listed above in the chief complaint.  Diabetes follow up: Her diabetic control is reported as Unchanged. Stressed and thinks that may be affecting her sugars although she denies sxs of hyperglycemia.  She denies exertional CP or SOB or symptomatic hypoglycemia. She denies foot sores ; on gabapentin for  paresthesias.  C/o poor balance Stress: depression and worry: worries about her daughter's healthy. Not sleeping well: insomnia and daughter sleeps poorly. Believe sertraline is helpful but is in a "funk". She is "tired".  Requests refills of steroid creams: using beneath breasts and inguinal folds (high dose steroid!).   Wt Readings from Last 3 Encounters:  04/30/22 216 lb (98 kg)  03/10/22 217 lb 4 oz (98.5 kg)  11/04/21 213 lb 12.8 oz (97 kg)    BP Readings from Last 3 Encounters:  04/30/22 108/78  03/10/22 112/68  11/04/21 118/78    Assessment  1. DM type 2 with diabetic peripheral neuropathy ([place])   2. Psoriasis   3. Intertrigo   4. Depression, major, single episode, moderate ([place])      Plan  Diabetes is currently well controlled. Continue meds. Reassured. Check urine nephropathy screen Psoriasis: not active. Discussed steroid use for this. Do not recommend daily use Intertrigo: educated. Nystatin cream x 2 weeks as needed.  Depression/stress reaction: monitor. Rec behavioral mgt strategies. Continue sertraline.   Follow up: 3 mo for cpe and recheck. Orders Placed This Encounter  Procedures   Microalbumin / creatinine urine ratio   POCT HgB A1C   Meds ordered this encounter  Medications   nystatin cream (MYCOSTATIN)    Sig: Apply 1 Application topically 2 (two) times daily. Use for 2 weeks as needed    Dispense:  30 g    Refill:  5      Immunization  History  Administered Date(s) Administered   COVID-19, mRNA, vaccine(Comirnaty)12 years and older 04/08/2022   Fluad Quad(high Dose 65+) 04/13/2019, 04/04/2020, 04/15/2021, 04/08/2022   H1N1 03/22/2021   Influenza Whole 03/22/2001, 03/22/2006, 03/22/2010   Influenza, High Dose Seasonal PF 02/19/2016, 06/02/2018, 04/08/2022   Influenza, Seasonal, Injecte, Preservative Fre 06/27/2012   Influenza-Unspecified 06/27/2012, 04/13/2019   Moderna Sars-Covid-2 Vaccination 08/14/2019   PFIZER(Purple Top)SARS-COV-2 Vaccination 07/17/2019, 07/24/2019, 05/08/2020   Pfizer Covid-19 Vaccine Bivalent Booster 33yr & up 04/08/2022   Pneumococcal Conjugate-13 11/27/2015   Pneumococcal Polysaccharide-23 11/13/2010, 07/16/2021   Tdap 11/13/2010, 09/14/2020   Zoster Recombinat (Shingrix) 04/13/2019   Zoster, Live 11/27/2015    Diabetes Related Lab Review: Lab Results  Component Value Date   HGBA1C 6.5 (A) 04/30/2022   HGBA1C 6.5 (A) 10/28/2021   HGBA1C 7.3 (A) 07/16/2021    Lab Results  Component Value Date   MICROALBUR 1.3 04/15/2021   Lab Results  Component Value Date   CREATININE 0.97 10/28/2021   BUN 16 10/28/2021   NA 138 10/28/2021   K 4.3 10/28/2021   CL 104 10/28/2021   CO2 25 10/28/2021   Lab Results  Component Value Date   CHOL 122 07/16/2021   CHOL 123 10/16/2020   CHOL 143 07/11/2020   Lab Results  Component Value Date   HDL 36.70 (L) 07/16/2021   HDL 41.60 10/16/2020   HDL 34.80 (L) 07/11/2020  Lab Results  Component Value Date   LDLCALC 64 07/16/2021   LDLCALC 66 10/16/2020   LDLCALC 86 07/11/2020   Lab Results  Component Value Date   TRIG 108.0 07/16/2021   TRIG 75.0 10/16/2020   TRIG 108.0 07/11/2020   Lab Results  Component Value Date   CHOLHDL 3 07/16/2021   CHOLHDL 3 10/16/2020   CHOLHDL 4 07/11/2020   Lab Results  Component Value Date   LDLDIRECT 165.3 11/13/2010   LDLDIRECT 143.5 08/30/2008   The ASCVD Risk score (Arnett DK, et al., 2019)  failed to calculate for the following reasons:   The valid total cholesterol range is 130 to 320 mg/dL I have reviewed the PMH, Fam and Soc history. Patient Active Problem List   Diagnosis Date Noted   Depression, major, single episode, moderate ([place]) 07/11/2020    Priority: High   DM type 2 with diabetic peripheral neuropathy ([place]) 09/02/2016    Priority: High    Diagnosed 2019; started meds 2020; + microalbunuria on ace    Recurrent kidney stones 08/29/2015    Priority: High    Overview:  S/p stents, multiple, [facility], Dr. Linton Ham    GAD (generalized anxiety disorder) 08/29/2015    Priority: High   Acquired hypothyroidism 11/02/2013    Priority: High   Mixed hyperlipidemia 04/04/2012    Priority: High   Fibromyalgia 10/11/2007    Priority: High   Chronic low back pain 10/11/2007    Priority: High   Osteopenia after menopause 06/06/2019    Priority: Medium     dexa 2019 T = -1.9, multiple site osteopenia;  DEXA December 2022, osteopenia    Bilateral hydronephrosis     Priority: Medium    Degenerative scoliosis 09/07/2018    Priority: Medium    Psoriasis 12/02/2017    Priority: Medium    History of Barrett's esophagus 08/29/2015    Priority: Medium     Last EGD 2018, no evidence of Barrett's esophagus, no further EGD recommended    GERD (gastroesophageal reflux disease) 08/29/2015    Priority: Medium    Irritable bowel syndrome with constipation 08/29/2015    Priority: Medium    Lumbosacral spondylosis with radiculopathy 08/29/2015    Priority: Medium    Primary insomnia 10/25/2014    Priority: Medium     Overview:  Last Assessment & Plan:  Insomnia is stable with current regimen of Ambien. Continue current dosage of Ambien.    Seborrheic dermatitis of scalp 12/02/2017    Priority: Low   Allergic rhinitis 10/11/2007    Priority: Low    Qualifier: Diagnosis of  By: Tiney Rouge CMA, Ellison Hughs      Lower extremity edema 04/15/2021   Barrett's esophagus with  esophagitis 08/29/2015    Formatting of this note might be different from the original. Last EGD 2018, no evidence of Barrett's esophagus, no further EGD recommended     Social History: Patient  reports that she has never smoked. She has never used smokeless tobacco. She reports current alcohol use of about 1.0 standard drink of alcohol per week. She reports that she does not use drugs.  Review of Systems: Ophthalmic: negative for eye pain, loss of vision or double vision Cardiovascular: negative for chest pain Respiratory: negative for SOB or persistent cough Gastrointestinal: negative for abdominal pain Genitourinary: negative for dysuria or gross hematuria MSK: negative for foot lesions Neurologic: negative for weakness or gait disturbance  Objective  Vitals: BP 108/78   Pulse 73   Temp  98.7 F (37.1 C)   Ht '5\' 3"'$  (1.6 m)   Wt 216 lb (98 kg)   SpO2 91%   BMI 38.26 kg/m  General: well appearing, no acute distress  Psych:  Alert and oriented, stressed/anxious mood and affect HEENT:  Normocephalic, atraumatic, moist mucous membranes, supple neck  Cardiovascular:  Nl S1 and S2, RRR without murmur, gallop or rub. no edema Respiratory:  Good breath sounds bilaterally, CTAB with normal effort, no rales Gastrointestinal: normal BS, soft, nontender Skin:  Warm, red rash below panus.      Diabetic education: ongoing education regarding chronic disease management for diabetes was given today. We continue to reinforce the ABC's of diabetic management: A1c (<7 or 8 dependent upon patient), tight blood pressure control, and cholesterol management with goal LDL < 100 minimally. We discuss diet strategies, exercise recommendations, medication options and possible side effects. At each visit, we review recommended immunizations and preventive care recommendations for diabetics and stress that good diabetic control can prevent other problems. See below for this patient's data.   Commons  side effects, risks, benefits, and alternatives for medications and treatment plan prescribed today were discussed, and the patient expressed understanding of the given instructions. Patient is instructed to call or message via MyChart if he/she has any questions or concerns regarding our treatment plan. No barriers to understanding were identified. We discussed Red Flag symptoms and signs in detail. Patient expressed understanding regarding what to do in case of urgent or emergency type symptoms.  Medication list was reconciled, printed and provided to the patient in AVS. Patient instructions and summary information was reviewed with the patient as documented in the AVS. This note was prepared with assistance of Dragon voice recognition software. Occasional wrong-word or sound-a-like substitutions may have occurred due to the inherent limitations of voice recognition software

## 2022-05-01 LAB — MICROALBUMIN / CREATININE URINE RATIO
Creatinine,U: 106.5 mg/dL
Microalb Creat Ratio: 1.3 mg/g (ref 0.0–30.0)
Microalb, Ur: 1.4 mg/dL (ref 0.0–1.9)

## 2022-05-22 ENCOUNTER — Other Ambulatory Visit: Payer: Self-pay

## 2022-05-22 ENCOUNTER — Emergency Department (HOSPITAL_COMMUNITY): Payer: Medicare PPO

## 2022-05-22 ENCOUNTER — Encounter (HOSPITAL_COMMUNITY): Payer: Self-pay

## 2022-05-22 ENCOUNTER — Emergency Department (HOSPITAL_COMMUNITY)
Admission: EM | Admit: 2022-05-22 | Discharge: 2022-05-23 | Disposition: A | Discharge status: Home / Self Care | Payer: Medicare PPO | Attending: Emergency Medicine | Admitting: Emergency Medicine

## 2022-05-22 DIAGNOSIS — Z7984 Long term (current) use of oral hypoglycemic drugs: Secondary | ICD-10-CM | POA: Insufficient documentation

## 2022-05-22 DIAGNOSIS — Y9222 Religious institution as the place of occurrence of the external cause: Secondary | ICD-10-CM | POA: Insufficient documentation

## 2022-05-22 DIAGNOSIS — S42255A Nondisplaced fracture of greater tuberosity of left humerus, initial encounter for closed fracture: Secondary | ICD-10-CM | POA: Insufficient documentation

## 2022-05-22 DIAGNOSIS — S4992XA Unspecified injury of left shoulder and upper arm, initial encounter: Secondary | ICD-10-CM | POA: Diagnosis not present

## 2022-05-22 DIAGNOSIS — Z043 Encounter for examination and observation following other accident: Secondary | ICD-10-CM | POA: Diagnosis not present

## 2022-05-22 DIAGNOSIS — E882 Lipomatosis, not elsewhere classified: Secondary | ICD-10-CM | POA: Diagnosis not present

## 2022-05-22 DIAGNOSIS — S42212A Unspecified displaced fracture of surgical neck of left humerus, initial encounter for closed fracture: Secondary | ICD-10-CM | POA: Diagnosis not present

## 2022-05-22 DIAGNOSIS — M79602 Pain in left arm: Secondary | ICD-10-CM | POA: Diagnosis present

## 2022-05-22 DIAGNOSIS — W1839XA Other fall on same level, initial encounter: Secondary | ICD-10-CM | POA: Diagnosis not present

## 2022-05-22 DIAGNOSIS — M79622 Pain in left upper arm: Secondary | ICD-10-CM | POA: Diagnosis not present

## 2022-05-22 DIAGNOSIS — W19XXXA Unspecified fall, initial encounter: Secondary | ICD-10-CM | POA: Diagnosis not present

## 2022-05-22 DIAGNOSIS — S42215A Unspecified nondisplaced fracture of surgical neck of left humerus, initial encounter for closed fracture: Secondary | ICD-10-CM

## 2022-05-22 DIAGNOSIS — S0990XA Unspecified injury of head, initial encounter: Secondary | ICD-10-CM | POA: Diagnosis not present

## 2022-05-22 DIAGNOSIS — R0689 Other abnormalities of breathing: Secondary | ICD-10-CM | POA: Diagnosis not present

## 2022-05-22 DIAGNOSIS — M79603 Pain in arm, unspecified: Secondary | ICD-10-CM | POA: Diagnosis not present

## 2022-05-22 DIAGNOSIS — Q278 Other specified congenital malformations of peripheral vascular system: Secondary | ICD-10-CM | POA: Diagnosis not present

## 2022-05-22 MED ORDER — HYDROMORPHONE HCL 1 MG/ML IJ SOLN
1.0000 mg | Freq: Once | INTRAMUSCULAR | Status: AC
  Administered 2022-05-22: 1 mg via INTRAVENOUS
  Filled 2022-05-22: qty 1

## 2022-05-22 MED ORDER — ONDANSETRON HCL 4 MG/2ML IJ SOLN
4.0000 mg | Freq: Once | INTRAMUSCULAR | Status: AC
  Administered 2022-05-22: 4 mg via INTRAVENOUS
  Filled 2022-05-22: qty 2

## 2022-05-22 NOTE — ED Triage Notes (Signed)
[place] step at [place] - no LOC and NO anti-coags.  PT feel on left side - c/o left arm (humerus area) and left rib pain.  Pt had 200 mcg Fentanyl and still 9/10 pain.

## 2022-05-23 ENCOUNTER — Emergency Department (HOSPITAL_COMMUNITY): Payer: Medicare PPO

## 2022-05-23 DIAGNOSIS — Z043 Encounter for examination and observation following other accident: Secondary | ICD-10-CM | POA: Diagnosis not present

## 2022-05-23 DIAGNOSIS — S0990XA Unspecified injury of head, initial encounter: Secondary | ICD-10-CM | POA: Diagnosis not present

## 2022-05-23 DIAGNOSIS — E882 Lipomatosis, not elsewhere classified: Secondary | ICD-10-CM | POA: Diagnosis not present

## 2022-05-23 DIAGNOSIS — S42212A Unspecified displaced fracture of surgical neck of left humerus, initial encounter for closed fracture: Secondary | ICD-10-CM | POA: Diagnosis not present

## 2022-05-23 DIAGNOSIS — Q278 Other specified congenital malformations of peripheral vascular system: Secondary | ICD-10-CM | POA: Diagnosis not present

## 2022-05-23 DIAGNOSIS — S42255A Nondisplaced fracture of greater tuberosity of left humerus, initial encounter for closed fracture: Secondary | ICD-10-CM | POA: Diagnosis not present

## 2022-05-23 MED ORDER — HYDROMORPHONE HCL 1 MG/ML IJ SOLN
0.5000 mg | Freq: Once | INTRAMUSCULAR | Status: AC
  Administered 2022-05-23: 0.5 mg via INTRAVENOUS
  Filled 2022-05-23: qty 1

## 2022-05-23 MED ORDER — OXYCODONE-ACETAMINOPHEN 5-325 MG PO TABS: 1.0000 | ORAL_TABLET | Freq: Four times a day (QID) | ORAL | 0 refills | Status: DC | PRN

## 2022-05-23 MED ORDER — HYDROMORPHONE HCL 1 MG/ML IJ SOLN
1.0000 mg | Freq: Once | INTRAMUSCULAR | Status: AC
  Administered 2022-05-23: 1 mg via INTRAVENOUS
  Filled 2022-05-23: qty 1

## 2022-05-23 NOTE — Discharge Instructions (Addendum)
You are seen here today for your left arm pain after your fall.  You have broken your humerus which is the bone in your upper arm.  You been placed in a shoulder immobilizer to wear at all times and to follow-up with the orthopedist listed below, Dr. Marcelino Scot.  Use the prescribed pain medication as needed as well as supplemental Tylenol.  Follow-up with your PCP and return to the ER with any new severe symptoms

## 2022-05-23 NOTE — ED Provider Notes (Signed)
[facility] DEPT Provider Note   CSN: [id number] Arrival date & time: 05/22/22  2128     History  Chief Complaint  Patient presents with   Fall    Miranda Mathis is a 71 y.o. female who presents with concern for severe left arm pain and left-sided rib pain secondary to mechanical fall while leaving church today.  Patient states that she missed a step and fell firmly on her left side on the concrete.  Patient is tearful, clutching her left humerus at time of my evaluation. I personally reviewed her medical record.  She is history of fibromyalgia, recurrent kidney stones, Barrett's esophagus, type 2 diabetes, GAD, GERD.  She is not on any anticoagulation.  HPI     Home Medications Prior to Admission medications   Medication Sig Start Date End Date Taking? Authorizing Provider  Cholecalciferol (VITAMIN D) 50 MCG (2000 UT) tablet Take 2,000 Units by mouth daily.     [provider]  clobetasol (TEMOVATE) 0.05 % external solution Apply 1 application topically 2 (two) times daily. Scalp 11/26/20   Sheffield, Vida Roller R, PA-C  cyclobenzaprine (FLEXERIL) 5 MG tablet Take 1 tablet (5 mg total) by mouth 3 (three) times daily as needed for muscle spasms. Patient not taking: Reported on 04/30/2022 03/10/22   Tawnya Crook, MD  gabapentin (NEURONTIN) 300 MG capsule Take 2 capsules (600 mg total) by mouth 3 (three) times daily. 10/28/21   Leamon Arnt, MD  hydrochlorothiazide (HYDRODIURIL) 12.5 MG tablet TAKE 1 TABLET BY MOUTH EVERY DAY 04/09/22   Leamon Arnt, MD  levothyroxine (SYNTHROID) 200 MCG tablet TAKE 1 TABLET BY MOUTH EVERY DAY 04/09/22   Leamon Arnt, MD  lisinopril (ZESTRIL) 5 MG tablet TAKE 1 TABLET BY MOUTH EVERY DAY 08/08/21   Leamon Arnt, MD  meloxicam (MOBIC) 7.5 MG tablet TAKE 1 TABLET (7.5 MG TOTAL) BY MOUTH DAILY AS NEEDED FOR PAIN 01/06/22   Leamon Arnt, MD  metFORMIN (GLUCOPHAGE) 1000 MG tablet Take 1 tablet (1,000 mg total) by  mouth 2 (two) times daily with a meal. 07/16/21   Leamon Arnt, MD  nystatin cream (MYCOSTATIN) Apply 1 Application topically 2 (two) times daily. Use for 2 weeks as needed 04/30/22   Leamon Arnt, MD  omeprazole (PRILOSEC) 40 MG capsule TAKE 1 CAPSULE BY MOUTH EVERY DAY 01/06/22   Leamon Arnt, MD  rosuvastatin (CRESTOR) 40 MG tablet TAKE 1 TABLET BY MOUTH EVERY DAY 07/22/21   Leamon Arnt, MD  senna-docusate (SENOKOT-S) 8.6-50 MG tablet Take 2 tablets by mouth every evening.     [provider]  sertraline (ZOLOFT) 100 MG tablet TAKE 2 TABLETS BY MOUTH EVERY DAY 04/09/22   Leamon Arnt, MD  traMADol (ULTRAM) 50 MG tablet Take 1 tablet (50 mg total) by mouth every 8 (eight) hours as needed. 03/10/22   Tawnya Crook, MD  zolpidem (AMBIEN) 10 MG tablet TAKE 1 TABLET (10 MG TOTAL) BY MOUTH AT BEDTIME AS NEEDED FOR SLEEP 01/09/22   Leamon Arnt, MD  pravastatin (PRAVACHOL) 40 MG tablet Take 1 tablet (40 mg total) by mouth daily. 01/26/19 06/06/19  Leamon Arnt, MD      Allergies    Ketorolac    Review of Systems   Review of Systems  Musculoskeletal:        Patient with severe left upper arm pain, left knee pain    Physical Exam Updated Vital Signs BP 96/67  Pulse 88   Temp 99 F (37.2 C) (Oral)   Resp 18   Ht '5\' 3"'$  (1.6 m)   Wt 98 kg   SpO2 96%   BMI 38.26 kg/m  Physical Exam Vitals and nursing note reviewed.  Constitutional:      General: She is in acute distress.     Appearance: She is obese. She is not ill-appearing or toxic-appearing.  HENT:     Head: Normocephalic and atraumatic.     Nose: Nose normal.     Mouth/Throat:     Mouth: Mucous membranes are moist.     Pharynx: No oropharyngeal exudate or posterior oropharyngeal erythema.  Eyes:     General:        Right eye: No discharge.        Left eye: No discharge.     Extraocular Movements: Extraocular movements intact.     Conjunctiva/sclera: Conjunctivae normal.     Pupils: Pupils are  equal, round, and reactive to light.  Cardiovascular:     Rate and Rhythm: Normal rate and regular rhythm.     Pulses: Normal pulses.     Heart sounds: Normal heart sounds. No murmur heard. Pulmonary:     Effort: Pulmonary effort is normal. No respiratory distress.     Breath sounds: Normal breath sounds. No wheezing or rales.  Abdominal:     General: Bowel sounds are normal. There is no distension.     Palpations: Abdomen is soft.     Tenderness: There is no abdominal tenderness. There is no guarding or rebound.  Musculoskeletal:        General: No deformity.     Right shoulder: Normal.     Left shoulder: Tenderness and bony tenderness present. Decreased range of motion.     Right upper arm: Normal.     Left upper arm: Tenderness and bony tenderness present.     Right elbow: Normal.     Left elbow: Normal.     Right forearm: Normal.     Left forearm: Normal.     Right wrist: Normal.     Left wrist: Normal.     Cervical back: Normal and neck supple. No bony tenderness.     Thoracic back: Normal. No bony tenderness.     Lumbar back: Normal.     Right hip: Normal.     Left hip: Normal.     Right upper leg: Normal.     Left upper leg: Normal.     Right knee: Normal.     Left knee: Bony tenderness present. Normal range of motion.     Right lower leg: Normal. No edema.     Left lower leg: Normal. No edema.     Right ankle: Normal.     Right Achilles Tendon: Normal.     Left ankle: Normal.     Left Achilles Tendon: Normal.     Right foot: Normal.     Left foot: Normal.     Comments: 2+ radial pulses bilaterally, brisk capillary refill in the left hand, normal sensation, and ROM of the elbow, wrist and fingers of the left arm.   Skin:    General: Skin is warm and dry.     Capillary Refill: Capillary refill takes less than 2 seconds.  Neurological:     General: No focal deficit present.     Mental Status: She is alert and oriented to person, place, and time. Mental status is at  baseline.  Sensory: Sensation is intact.  Psychiatric:        Mood and Affect: Mood normal.     ED Results / Procedures / Treatments   Labs (all labs ordered are listed, but only abnormal results are displayed) Labs Reviewed - No data to display  EKG None  Radiology DG Knee Complete 4 Views Left  Result Date: 05/22/2022 CLINICAL DATA:  Fall. EXAM: LEFT KNEE - COMPLETE 4+ VIEW COMPARISON:  Fall FINDINGS: No evidence of fracture, dislocation, or joint effusion. There is mediolateral compartment chondrocalcinosis compatible with degenerative change. Soft tissues are unremarkable. IMPRESSION: 1. No fracture or dislocation. Electronically Signed   By: Ronney Asters M.D.   On: 05/22/2022 23:22   DG Shoulder Left  Result Date: 05/22/2022 CLINICAL DATA:  Fall EXAM: LEFT SHOULDER - 2+ VIEW COMPARISON:  None Available. FINDINGS: Examination is limited secondary to patient positioning. There is no acute fracture or dislocation. There is glenohumeral and acromioclavicular joint space narrowing compatible with degenerative change. Soft tissues are within normal limits. IMPRESSION: Negative. Electronically Signed   By: Ronney Asters M.D.   On: 05/22/2022 23:18    Procedures Procedures    Medications Ordered in ED Medications  HYDROmorphone (DILAUDID) injection 1 mg (1 mg Intravenous Given 05/22/22 2248)  ondansetron (ZOFRAN) injection 4 mg (4 mg Intravenous Given 05/22/22 2248)    ED Course/ Medical Decision Making/ A&P                           Medical Decision Making 71 year old female who presents with concern for left shoulder pain after mechanical fall.   VS normal on intake, cardiopulmonary seems normal, abdominal exam is benign.  Patient visibly uncomfortable, tearful on intake secondary to pain in the left upper arm.  No deformities crepitus or translation of the chest wall, tender to palpation of the left inferolateral ribs without deformity or crepitus, bruising or skin changes.   Lower extremity physical exam with mild tenderness palpation over the left anterior knee.  Normal neurovascular status in the left upper extremity.  Amount and/or Complexity of Data Reviewed Radiology: ordered.    Details: CT head and C-spine negative for acute Intracranial abnormality or traumatic injury of the C-spine.  Plain films of the left shoulder initially read as negative, however on my review of the plain left shoulder film concern for humeral head fracture.  Plain film the left ribs without acute rib fracture or cardiopulmonary disease, however this radiologist did question involvement of the left proximal humerus with fracture.  CT imaging for definitive evaluation confirmed a left surgical neck humeral fracture and 1 cm of translation of the distal main fragment as well as nondisplaced intra-articular fracture of the humeral head with longitudinal fracture extending through the greater tuberosity with minimal lateral displacement.    Risk Prescription drug management.   Placed the patient in a shoulder immobilizer and provided with prescription for narcotic analgesia at home as well as information for follow-up with orthopedist, Dr. Marcelino Scot who is on-call. Normal neurovascular status in the hand following immobilizer placement.   No further workup warranted at this time.  Clinical concern for emergent underlying injury that would warrant further ED workup or inpatient management is exceedingly low.  Rhema  voiced understanding of her medical evaluation and treatment plan. Each of their questions answered to their expressed satisfaction.  Return precautions were given.  Patient is well-appearing, stable, and was discharged in goo condition.  This chart  was dictated using voice recognition software, Dragon. Despite the best efforts of this provider to proofread and correct errors, errors may still occur which can change documentation meaning.  Final Clinical Impression(s) / ED  Diagnoses Final diagnoses:  None    Rx / DC Orders ED Discharge Orders     None         Emeline Darling, PA-C 05/23/22 0450    Maudie Flakes, MD 05/23/22 [id number]

## 2022-05-27 ENCOUNTER — Ambulatory Visit (INDEPENDENT_AMBULATORY_CARE_PROVIDER_SITE_OTHER): Payer: Medicare PPO | Admitting: Family Medicine

## 2022-05-27 ENCOUNTER — Encounter: Payer: Self-pay | Admitting: Family Medicine

## 2022-05-27 VITALS — BP 126/70 | HR 70 | Temp 97.8°F | Ht 63.0 in | Wt 215.8 lb

## 2022-05-27 DIAGNOSIS — S42292D Other displaced fracture of upper end of left humerus, subsequent encounter for fracture with routine healing: Secondary | ICD-10-CM

## 2022-05-27 DIAGNOSIS — R600 Localized edema: Secondary | ICD-10-CM | POA: Diagnosis not present

## 2022-05-27 MED ORDER — HYDROCODONE-ACETAMINOPHEN 5-325 MG PO TABS: 1.0000 | ORAL_TABLET | Freq: Four times a day (QID) | ORAL | 0 refills | Status: DC | PRN

## 2022-05-27 NOTE — Patient Instructions (Signed)
Please follow up as scheduled for your next visit with me: 07/28/2022   If you have any questions or concerns, please don't hesitate to send me a message via MyChart or call the office at [phone]. Thank you for visiting with [name] today! It's our pleasure caring for you.   Please call [facility] to get set up to see an orthopedist who can treat your shoulder fracture.  IF they cannot see you, call Dr. Carlean Jews office to get an appointment  Dr. Marcelino Scot Specialty: Orthopedic Surgery Contact: [place] [phone]

## 2022-05-27 NOTE — Progress Notes (Signed)
Subjective  CC:  Chief Complaint  Patient presents with   Fall    Pt stated that she fell and broke her shoulder on 05/22/2022    HPI: Miranda Mathis is a 71 y.o. female who presents to the office today to address the problems listed above in the chief complaint. Reviewed ED notes 12/1: CT left shoulder shows fracture of humeral head. Hasn't yet made an appt with ortho. Requesting pain meds. Has taken oxycodone #15 pills since d/c. Took her last 2 hours ago. Had some nausea with it Left should CT: IMPRESSION: 1. Comminuted surgical neck proximal left humeral fracture with mild impaction and with up to 1 cm medial and anterior translation of the distal main fragment. 2. Nondisplaced intra-articular fracture of the anteromedial aspect of the humeral head, and a longitudinal fracture through the greater tuberosity with the fragment displaced slightly cephalad and minimally laterally displaced. 3. No other fractures are seen. 4. Osteopenia and degenerative change. 5. Cardiomegaly. 6. Aberrant retroesophageal right subclavian artery. 7. Mosaic attenuation of the lungs consistent with small airways disease. 8. Hepatic cirrhosis. 9. Small hiatal hernia. 10. Aortic atherosclerosis.  Reports legs are more swollen. She has been sitting up in recliner and sleeping sitting up as well since fall/fracture.  Assessment  1. Closed fracture of head of left humerus with routine healing, subsequent encounter   2. Lower extremity edema      Plan  Shoulder fracture left:  continue with sling; will refill pain meds but downgrade to norco. Discussed avoidance of more falls and supervision while on pain meds. She will contact [facility] since already established there. If can't get it, then Dr. Marcelino Scot per ER on call recommendations. Edema: rec increase water intake and elevated legs.   Follow up: prn  07/28/2022  No orders of the defined types were placed in this encounter.  Meds ordered this  encounter  Medications   HYDROcodone-acetaminophen (NORCO) 5-325 MG tablet    Sig: Take 1 tablet by mouth every 6 (six) hours as needed for moderate pain.    Dispense:  20 tablet    Refill:  0      I reviewed the patients updated PMH, FH, and SocHx.    Patient Active Problem List   Diagnosis Date Noted   Depression, major, single episode, moderate ([place]) 07/11/2020    Priority: High   DM type 2 with diabetic peripheral neuropathy ([place]) 09/02/2016    Priority: High   Recurrent kidney stones 08/29/2015    Priority: High   GAD (generalized anxiety disorder) 08/29/2015    Priority: High   Acquired hypothyroidism 11/02/2013    Priority: High   Mixed hyperlipidemia 04/04/2012    Priority: High   Fibromyalgia 10/11/2007    Priority: High   Chronic low back pain 10/11/2007    Priority: High   Osteopenia after menopause 06/06/2019    Priority: Medium    Bilateral hydronephrosis     Priority: Medium    Degenerative scoliosis 09/07/2018    Priority: Medium    Psoriasis 12/02/2017    Priority: Medium    History of Barrett's esophagus 08/29/2015    Priority: Medium    GERD (gastroesophageal reflux disease) 08/29/2015    Priority: Medium    Irritable bowel syndrome with constipation 08/29/2015    Priority: Medium    Lumbosacral spondylosis with radiculopathy 08/29/2015    Priority: Medium    Primary insomnia 10/25/2014    Priority: Medium    Seborrheic dermatitis of scalp  12/02/2017    Priority: Low   Allergic rhinitis 10/11/2007    Priority: Low   Lower extremity edema 04/15/2021   Barrett's esophagus with esophagitis 08/29/2015   Current Meds  Medication Sig   Cholecalciferol (VITAMIN D) 50 MCG (2000 UT) tablet Take 2,000 Units by mouth daily.    clobetasol (TEMOVATE) 0.05 % external solution Apply 1 application topically 2 (two) times daily. Scalp   cyclobenzaprine (FLEXERIL) 5 MG tablet Take 1 tablet (5 mg total) by mouth 3 (three) times daily as needed for muscle  spasms.   gabapentin (NEURONTIN) 300 MG capsule Take 2 capsules (600 mg total) by mouth 3 (three) times daily.   hydrochlorothiazide (HYDRODIURIL) 12.5 MG tablet TAKE 1 TABLET BY MOUTH EVERY DAY   HYDROcodone-acetaminophen (NORCO) 5-325 MG tablet Take 1 tablet by mouth every 6 (six) hours as needed for moderate pain.   levothyroxine (SYNTHROID) 200 MCG tablet TAKE 1 TABLET BY MOUTH EVERY DAY   lisinopril (ZESTRIL) 5 MG tablet TAKE 1 TABLET BY MOUTH EVERY DAY   meloxicam (MOBIC) 7.5 MG tablet TAKE 1 TABLET (7.5 MG TOTAL) BY MOUTH DAILY AS NEEDED FOR PAIN   metFORMIN (GLUCOPHAGE) 1000 MG tablet Take 1 tablet (1,000 mg total) by mouth 2 (two) times daily with a meal.   nystatin cream (MYCOSTATIN) Apply 1 Application topically 2 (two) times daily. Use for 2 weeks as needed   omeprazole (PRILOSEC) 40 MG capsule TAKE 1 CAPSULE BY MOUTH EVERY DAY   rosuvastatin (CRESTOR) 40 MG tablet TAKE 1 TABLET BY MOUTH EVERY DAY   senna-docusate (SENOKOT-S) 8.6-50 MG tablet Take 2 tablets by mouth every evening.    sertraline (ZOLOFT) 100 MG tablet TAKE 2 TABLETS BY MOUTH EVERY DAY   zolpidem (AMBIEN) 10 MG tablet TAKE 1 TABLET (10 MG TOTAL) BY MOUTH AT BEDTIME AS NEEDED FOR SLEEP   [DISCONTINUED] oxyCODONE-acetaminophen (PERCOCET/ROXICET) 5-325 MG tablet Take 1 tablet by mouth every 6 (six) hours as needed for severe pain.    Allergies: Patient is allergic to ketorolac. Family History: Patient family history includes Arthritis in her brother, daughter, father, maternal grandmother, and mother; Asthma in her daughter; Birth defects in her daughter and father; COPD in her mother; Cancer in her father; Coronary artery disease in her paternal grandfather; Depression in her daughter; Diabetes in her paternal aunt; Emphysema in her mother; Hearing loss in her daughter; Hyperlipidemia in her brother; Hypertension in her father and mother; Kidney Stones in her daughter; Mental retardation in her daughter; Osteoarthritis in  her mother; Prostate cancer in her father; Rheum arthritis in her mother. Social History:  Patient  reports that she has never smoked. She has never used smokeless tobacco. She reports current alcohol use of about 1.0 standard drink of alcohol per week. She reports that she does not use drugs.  Review of Systems: Constitutional: Negative for fever malaise or anorexia Cardiovascular: negative for chest pain Respiratory: negative for SOB or persistent cough Gastrointestinal: negative for abdominal pain  Objective  Vitals: BP 126/70   Pulse 70   Temp 97.8 F (36.6 C)   Ht '5\' 3"'$  (1.6 m)   Wt 215 lb 12.8 oz (97.9 kg)   SpO2 95%   BMI 38.23 kg/m  General: no acute distress , A&Ox3 but mildly sedated and altered from pain med Wearing sling on left arm, nl distal pulses and sensateion +2 edema bilateral lower ext.     Commons side effects, risks, benefits, and alternatives for medications and treatment plan prescribed today were  discussed, and the patient expressed understanding of the given instructions. Patient is instructed to call or message via MyChart if he/she has any questions or concerns regarding our treatment plan. No barriers to understanding were identified. We discussed Red Flag symptoms and signs in detail. Patient expressed understanding regarding what to do in case of urgent or emergency type symptoms.  Medication list was reconciled, printed and provided to the patient in AVS. Patient instructions and summary information was reviewed with the patient as documented in the AVS. This note was prepared with assistance of Dragon voice recognition software. Occasional wrong-word or sound-a-like substitutions may have occurred due to the inherent limitations of voice recognition software  This visit occurred during the SARS-CoV-2 public health emergency.  Safety protocols were in place, including screening questions prior to the visit, additional usage of staff PPE, and extensive  cleaning of exam room while observing appropriate contact time as indicated for disinfecting solutions.

## 2022-06-02 DIAGNOSIS — M25512 Pain in left shoulder: Secondary | ICD-10-CM | POA: Diagnosis not present

## 2022-07-06 DIAGNOSIS — M25512 Pain in left shoulder: Secondary | ICD-10-CM | POA: Diagnosis not present

## 2022-07-08 LAB — HM DIABETES EYE EXAM

## 2022-07-10 ENCOUNTER — Other Ambulatory Visit: Payer: Self-pay | Admitting: Family Medicine

## 2022-07-14 ENCOUNTER — Other Ambulatory Visit: Payer: Self-pay | Admitting: Family Medicine

## 2022-07-15 NOTE — Telephone Encounter (Signed)
Last refill: 01/09/22 #30, 5 Last OV: 05/27/22 dx. Closed fracture

## 2022-07-28 ENCOUNTER — Encounter: Payer: Self-pay | Admitting: Family Medicine

## 2022-07-28 ENCOUNTER — Ambulatory Visit (INDEPENDENT_AMBULATORY_CARE_PROVIDER_SITE_OTHER): Payer: Medicare PPO | Admitting: Family Medicine

## 2022-07-28 VITALS — BP 122/60 | HR 76 | Temp 97.8°F | Ht 63.0 in | Wt 217.8 lb

## 2022-07-28 DIAGNOSIS — Z0000 Encounter for general adult medical examination without abnormal findings: Secondary | ICD-10-CM | POA: Diagnosis not present

## 2022-07-28 DIAGNOSIS — M797 Fibromyalgia: Secondary | ICD-10-CM

## 2022-07-28 DIAGNOSIS — F1190 Opioid use, unspecified, uncomplicated: Secondary | ICD-10-CM

## 2022-07-28 DIAGNOSIS — F321 Major depressive disorder, single episode, moderate: Secondary | ICD-10-CM | POA: Diagnosis not present

## 2022-07-28 DIAGNOSIS — F5101 Primary insomnia: Secondary | ICD-10-CM | POA: Diagnosis not present

## 2022-07-28 DIAGNOSIS — E782 Mixed hyperlipidemia: Secondary | ICD-10-CM

## 2022-07-28 DIAGNOSIS — E039 Hypothyroidism, unspecified: Secondary | ICD-10-CM

## 2022-07-28 DIAGNOSIS — R600 Localized edema: Secondary | ICD-10-CM

## 2022-07-28 DIAGNOSIS — L304 Erythema intertrigo: Secondary | ICD-10-CM | POA: Diagnosis not present

## 2022-07-28 DIAGNOSIS — E1142 Type 2 diabetes mellitus with diabetic polyneuropathy: Secondary | ICD-10-CM | POA: Diagnosis not present

## 2022-07-28 LAB — POCT GLYCOSYLATED HEMOGLOBIN (HGB A1C): Hemoglobin A1C: 6.7 % — AB (ref 4.0–5.6)

## 2022-07-28 LAB — COMPREHENSIVE METABOLIC PANEL
ALT: 27 U/L (ref 0–35)
AST: 39 U/L — ABNORMAL HIGH (ref 0–37)
Albumin: 4.4 g/dL (ref 3.5–5.2)
Alkaline Phosphatase: 66 U/L (ref 39–117)
BUN: 19 mg/dL (ref 6–23)
CO2: 24 mEq/L (ref 19–32)
Calcium: 9.6 mg/dL (ref 8.4–10.5)
Chloride: 102 mEq/L (ref 96–112)
Creatinine, Ser: 1.09 mg/dL (ref 0.40–1.20)
GFR: 50.87 mL/min — ABNORMAL LOW (ref >60.00)
Glucose, Bld: 138 mg/dL — ABNORMAL HIGH (ref 70–99)
Potassium: 4 mEq/L (ref 3.5–5.1)
Sodium: 142 mEq/L (ref 135–145)
Total Bilirubin: 0.4 mg/dL (ref 0.2–1.2)
Total Protein: 6.9 g/dL (ref 6.0–8.3)

## 2022-07-28 LAB — CBC WITH DIFFERENTIAL/PLATELET
Basophils Absolute: 0 10*3/uL (ref 0.0–0.1)
Basophils Relative: 0.4 % (ref 0.0–3.0)
Eosinophils Absolute: 0.2 10*3/uL (ref 0.0–0.7)
Eosinophils Relative: 2.6 % (ref 0.0–5.0)
HCT: 35.6 % — ABNORMAL LOW (ref 36.0–46.0)
Hemoglobin: 11.6 g/dL — ABNORMAL LOW (ref 12.0–15.0)
Lymphocytes Relative: 17.3 % (ref 12.0–46.0)
Lymphs Abs: 1.1 10*3/uL (ref 0.7–4.0)
MCHC: 32.6 g/dL (ref 30.0–36.0)
MCV: 82.8 fl (ref 78.0–100.0)
Monocytes Absolute: 0.5 10*3/uL (ref 0.1–1.0)
Monocytes Relative: 8.1 % (ref 3.0–12.0)
Neutro Abs: 4.4 10*3/uL (ref 1.4–7.7)
Neutrophils Relative %: 71.6 % (ref 43.0–77.0)
Platelets: 200 10*3/uL (ref 150.0–400.0)
RBC: 4.3 Mil/uL (ref 3.87–5.11)
RDW: 17.1 % — ABNORMAL HIGH (ref 11.5–15.5)
WBC: 6.1 10*3/uL (ref 4.0–10.5)

## 2022-07-28 LAB — LIPID PANEL
Cholesterol: 105 mg/dL (ref 0–200)
HDL: 38.7 mg/dL — ABNORMAL LOW (ref >39.00)
LDL Cholesterol: 46 mg/dL (ref 0–99)
NonHDL: 66.07
Total CHOL/HDL Ratio: 3
Triglycerides: 102 mg/dL (ref 0.0–149.0)
VLDL: 20.4 mg/dL (ref 0.0–40.0)

## 2022-07-28 LAB — TSH: TSH: 3.79 u[IU]/mL (ref 0.35–5.50)

## 2022-07-28 MED ORDER — POTASSIUM CHLORIDE CRYS ER 20 MEQ PO TBCR: 20.0000 meq | EXTENDED_RELEASE_TABLET | Freq: Every day | ORAL | 3 refills | Status: DC | PRN

## 2022-07-28 MED ORDER — FUROSEMIDE 20 MG PO TABS: 20.0000 mg | ORAL_TABLET | Freq: Every day | ORAL | 3 refills | Status: DC | PRN

## 2022-07-28 MED ORDER — CLOTRIMAZOLE-BETAMETHASONE 1-0.05 % EX CREA: 1.0000 | TOPICAL_CREAM | Freq: Every day | CUTANEOUS | 0 refills | Status: DC

## 2022-07-28 MED ORDER — LISINOPRIL 5 MG PO TABS: 5.0000 mg | ORAL_TABLET | Freq: Every day | ORAL | 3 refills | Status: DC

## 2022-07-28 NOTE — Progress Notes (Signed)
Subjective  Chief Complaint  Patient presents with   Diabetes   Annual Exam    HPI: Miranda Mathis is a 72 y.o. female who presents to [facility] today for a Female Wellness Visit. She also has the concerns and/or needs as listed above in the chief complaint. These will be addressed in addition to the Health Maintenance Visit.   Wellness Visit: annual visit with health maintenance review and exam without Pap  HM: screens: overdue for mammo and dexa but still recovering from shoulder fracture so deferring. Eligible for shingrix but defers Chronic disease f/u and/or acute problem visit: (deemed necessary to be done in addition to the wellness visit): Chronic back pain and fibromyalgia on chronic narcotics.  Reports this is stable.  Managed by pain management Diabetes with peripheral neuropathy: Taking metformin 1000 twice daily and gabapentin 600 mg 3 times daily.  Close her sugars are controlled. Hyperlipidemia on Crestor 40 nightly tolerates. Hypothyroidism on levothyroxine 200 mcg daily.  Energy level is stable.  Due for recheck. Major depression on sertraline 200 mg daily.  Depression screen is positive however she reports it is mainly due to frustration with slow healing shoulder and limitations regarding that.  She does not feel like she needs changes in her mood medications.  Or suffering. Insomnia on Ambien.  Reports stable Rash in inguinal area below pannus: Has been using nystatin cream without relief Lower extremity edema persists in spite of hydrochlorothiazide.  Taking 25 daily.  Assessment  1. Annual physical exam   2. DM type 2 with diabetic peripheral neuropathy ([place])   3. Mixed hyperlipidemia   4. Fibromyalgia   5. Acquired hypothyroidism   6. Depression, major, single episode, moderate ([place])   7. Chronic narcotic use   8. Primary insomnia   9. Intertrigo   10. Lower extremity edema      Plan  Female Wellness Visit: Age appropriate  Health Maintenance and Prevention measures were discussed with patient. Included topics are cancer screening recommendations, ways to keep healthy (see AVS) including dietary and exercise recommendations, regular eye and dental care, use of seat belts, and avoidance of moderate alcohol use and tobacco use.  She will need to schedule mammogram and bone density later this year BMI: discussed patient's BMI and encouraged positive lifestyle modifications to help get to or maintain a target BMI. HM needs and immunizations were addressed and ordered. See below for orders. See HM and immunization section for updates.  Patient deferring Shingrix for now Routine labs and screening tests ordered including cmp, cbc and lipids where appropriate. Discussed recommendations regarding Vit D and calcium supplementation (see AVS)  Chronic disease management visit and/or acute problem visit: Diabetes: Control remains adequate.  Continue metformin 1000 twice daily and diabetic diet.  Check renal function and nephropathy screen today.  Patient needs eye exam.  She is on an ACE inhibitor. Hypothyroidism for recheck today.  Clinically euthyroid.  Continue levothyroxine 200 mcg unless needed to change depending on TSH Depression: Continue sertraline 200 daily.  Counseling done. Pain management managing chronic pain and back. Intertrigo: Trial of intertrigo.  Stop nystatin Hyperlipidemia Crestor 40.  Recheck lipids and LFTs. Continue Ambien 10 mg nightly for insomnia.  Use monitored and appropriate Lower extremity edema, dependent/venous insufficiency: No rash.  Changed to Lasix 20 with 20 of K.  Monitor potassium.  May be able to lessen dose of potassium given ACE inhibitor.  Instructions given.  Follow up: 3 months for diabetes  and recheck electrolytes Orders Placed This Encounter  Procedures   CBC with Differential/Platelet   Comprehensive metabolic panel   Lipid panel   TSH   POCT HgB A1C   Meds ordered this  encounter  Medications   lisinopril (ZESTRIL) 5 MG tablet    Sig: Take 1 tablet (5 mg total) by mouth daily.    Dispense:  90 tablet    Refill:  3   furosemide (LASIX) 20 MG tablet    Sig: Take 1 tablet (20 mg total) by mouth daily as needed for edema. Take with potassium    Dispense:  90 tablet    Refill:  3   potassium chloride SA (KLOR-CON M) 20 MEQ tablet    Sig: Take 1 tablet (20 mEq total) by mouth daily as needed (leg swelling). Take with furosemide    Dispense:  90 tablet    Refill:  3   clotrimazole-betamethasone (LOTRISONE) cream    Sig: Apply 1 Application topically daily.    Dispense:  45 g    Refill:  0      Body mass index is 38.58 kg/m. Wt Readings from Last 3 Encounters:  07/28/22 217 lb 12.8 oz (98.8 kg)  05/27/22 215 lb 12.8 oz (97.9 kg)  05/22/22 216 lb (98 kg)     Patient Active Problem List   Diagnosis Date Noted   Depression, major, single episode, moderate ([place]) 07/11/2020    Priority: High   DM type 2 with diabetic peripheral neuropathy ([place]) 09/02/2016    Priority: High    Diagnosed 2019; started meds 2020; + microalbunuria on ace    Recurrent kidney stones 08/29/2015    Priority: High    Overview:  S/p stents, multiple, [facility], Dr. Linton Ham    GAD (generalized anxiety disorder) 08/29/2015    Priority: High   Acquired hypothyroidism 11/02/2013    Priority: High   Mixed hyperlipidemia 04/04/2012    Priority: High   Fibromyalgia 10/11/2007    Priority: High   Chronic low back pain 10/11/2007    Priority: High   Chronic narcotic use 07/28/2022    Priority: Medium    Osteopenia after menopause 06/06/2019    Priority: Medium     dexa 2019 T = -1.9, multiple site osteopenia;  DEXA December 2022, osteopenia    Bilateral hydronephrosis     Priority: Medium    Degenerative scoliosis 09/07/2018    Priority: Medium    Psoriasis 12/02/2017    Priority: Medium    History of Barrett's esophagus 08/29/2015    Priority: Medium     Last  EGD 2018, no evidence of Barrett's esophagus, no further EGD recommended    GERD (gastroesophageal reflux disease) 08/29/2015    Priority: Medium    Irritable bowel syndrome with constipation 08/29/2015    Priority: Medium    Lumbosacral spondylosis with radiculopathy 08/29/2015    Priority: Medium    Barrett's esophagus with esophagitis 08/29/2015    Priority: Medium     Formatting of this note might be different from the original. Last EGD 2018, no evidence of Barrett's esophagus, no further EGD recommended    Primary insomnia 10/25/2014    Priority: Medium     Overview:  Last Assessment & Plan:  Insomnia is stable with current regimen of Ambien. Continue current dosage of Ambien.    Seborrheic dermatitis of scalp 12/02/2017    Priority: Low   Allergic rhinitis 10/11/2007    Priority: Low    Qualifier: Diagnosis of  By: Tiney Rouge CMA, Ellison Hughs      Lower extremity edema 04/15/2021   Health Maintenance  Topic Date Due   Zoster Vaccines- Shingrix (2 of 2) 06/08/2019   Medicare Annual Wellness (AWV)  08/05/2021   OPHTHALMOLOGY EXAM  04/05/2022   COVID-19 Vaccine (6 - 2023-24 season) 08/13/2022 (Originally 06/03/2022)   MAMMOGRAM  01/24/2023 (Originally 06/20/2022)   Diabetic kidney evaluation - eGFR measurement  10/29/2022   FOOT EXAM  10/29/2022   HEMOGLOBIN A1C  01/26/2023   Diabetic kidney evaluation - Urine ACR  05/02/2023   COLONOSCOPY (Pts 45-74yr Insurance coverage will need to be confirmed)  05/02/2023   DEXA SCAN  06/21/2023   DTaP/Tdap/Td (3 - Td or Tdap) 09/15/2030   Pneumonia Vaccine 72 Years old  Completed   INFLUENZA VACCINE  Completed   Hepatitis C Screening  Completed   HPV VACCINES  Aged Out   Immunization History  Administered Date(s) Administered   COVID-19, mRNA, vaccine(Comirnaty)12 years and older 04/08/2022   Fluad Quad(high Dose 65+) 04/13/2019, 04/04/2020, 04/15/2021, 04/08/2022   H1N1 03/22/2021   Influenza Whole 03/22/2001, 03/22/2006,  03/22/2010   Influenza, High Dose Seasonal PF 02/19/2016, 06/02/2018, 04/08/2022   Influenza, Seasonal, Injecte, Preservative Fre 06/27/2012   Influenza-Unspecified 06/27/2012, 04/13/2019   Moderna Sars-Covid-2 Vaccination 08/14/2019   PFIZER(Purple Top)SARS-COV-2 Vaccination 07/17/2019, 07/24/2019, 05/08/2020   Pfizer Covid-19 Vaccine Bivalent Booster 122yr& up 04/08/2022   Pneumococcal Conjugate-13 11/27/2015   Pneumococcal Polysaccharide-23 11/13/2010, 07/16/2021   Tdap 11/13/2010, 09/14/2020   Zoster Recombinat (Shingrix) 04/13/2019   Zoster, Live 11/27/2015   We updated and reviewed the patient's past history in detail and it is documented below. Allergies: Patient is allergic to ketorolac. Past Medical History Patient  has a past medical history of Allergic rhinitis, Allergy, Barrett's esophagus, Bladder spasms, Chronic back pain, DDD (degenerative disc disease), lumbar, Degenerative scoliosis, Depression, Diabetes mellitus without complication ([place] Diabetic peripheral neuropathy ([place] Fibromyalgia, Full dentures, GAD (generalized anxiety disorder), GERD (gastroesophageal reflux disease), History of bladder stone, History of colon polyps, History of kidney stones, History of migraine, History of recurrent UTIs, History of sepsis, Hydronephrosis, Hydronephrosis, bilateral, Hyperlipidemia, Hypertension, Hypothyroidism (11/02/2013 dx), IBS (irritable bowel syndrome), Insomnia, Osteopenia, Osteopenia after menopause (06/06/2019), Peripheral neuropathy, Psoriasis, Restless leg syndrome, Seborrheic dermatitis of scalp, Spondylolysis of lumbar region, and Wears glasses. Past Surgical History Patient  has a past surgical history that includes Cesarean section (X2); CYSTO/ BILATERAL URETEROSCOPY / URETERAL BX'S/ BILATERAL URETERAL STENT PLACEMENT/ BLADDER STONE EXTRACTION (03-13-2011); RIGHT URETEROSCOPIC / URETERAL BX/ STENT PLACEMENT (04-17-2011); RIGHT FOOT SURG (2003); Lumbar microdiscectomy  (1990'S); Abdominal hysterectomy (1987); Cystoscopy w/ retrogrades (07/10/2011); Balloon dilation (07/10/2011); Cystoscopy w/ ureteral stent removal (07/10/2011); Cystoscopy w/ ureteral stent placement (07/10/2011); Cystoscopy w/ ureteral stent placement (Bilateral, 10/22/2018); Cholecystectomy; Cystoscopy with ureteroscopy and stent placement (Bilateral, 11/29/2018); Upper gi endoscopy; Cystoscopy/retrograde/ureteroscopy (Left, 11/14/2019); Colonoscopy (2018); and Upper gastrointestinal endoscopy (2018). Family History: Patient family history includes Arthritis in her brother, daughter, father, maternal grandmother, and mother; Asthma in her daughter; Birth defects in her daughter and father; COPD in her mother; Cancer in her father; Coronary artery disease in her paternal grandfather; Depression in her daughter; Diabetes in her paternal aunt; Emphysema in her mother; Hearing loss in her daughter; Hyperlipidemia in her brother; Hypertension in her father and mother; Kidney Stones in her daughter; Mental retardation in her daughter; Osteoarthritis in her mother; Prostate cancer in her father; Rheum arthritis in her mother. Social History:  Patient  reports that she has never smoked. She has never used  smokeless tobacco. She reports current alcohol use of about 1.0 standard drink of alcohol per week. She reports that she does not use drugs.  Review of Systems: Constitutional: negative for fever or malaise Ophthalmic: negative for photophobia, double vision or loss of vision Cardiovascular: negative for chest pain, dyspnea on exertion, or new LE swelling Respiratory: negative for SOB or persistent cough Gastrointestinal: negative for abdominal pain, change in bowel habits or melena Genitourinary: negative for dysuria or gross hematuria, no abnormal uterine bleeding or disharge Musculoskeletal: negative for new gait disturbance or muscular weakness Integumentary: negative for new or persistent rashes, no breast  lumps Neurological: negative for TIA or stroke symptoms Psychiatric: negative for SI or delusions Allergic/Immunologic: negative for hives  Patient Care Team    Relationship Specialty Notifications Start End  Leamon Arnt, MD PCP - General Family Medicine  12/02/17   Festus Aloe, MD  Urology  10/06/13   Gatha Mayer, MD  Gastroenterology  10/06/13   Curt Jews, OD  Optometry  12/02/17   Melina Schools, MD Consulting Physician Orthopedic Surgery  09/15/18   Warren Danes, PA-C Physician Assistant Dermatology  10/20/19   Madelin Rear, [facility] (Inactive) Pharmacist Pharmacist  10/25/20    Comment: Phone [phone]    Objective  Vitals: BP 122/60   Pulse 76   Temp 97.8 F (36.6 C)   Ht '5\' 3"'$  (1.6 m)   Wt 217 lb 12.8 oz (98.8 kg)   SpO2 95%   BMI 38.58 kg/m  General:  Well developed, well nourished, no acute distress  Psych:  Alert and orientedx3,normal mood and affect HEENT:  Normocephalic, atraumatic, non-icteric sclera,  supple neck without adenopathy, mass or thyromegaly Cardiovascular:  Normal S1, S2, RRR without gallop, rub or murmur, +1 pitting edema bilateral lower extremities Respiratory:  Good breath sounds bilaterally, CTAB with normal respiratory effort Gastrointestinal: normal bowel sounds, soft, non-tender, no noted masses. No HSM MSK: no deformities, contusions. Joints are without erythema or swelling.  Skin:  Warm, erythematous rash in inguinal folds and pannus. Neurologic:    Mental status is normal. Gross motor and sensory exams are normal. Normal gait. No tremor  Commons side effects, risks, benefits, and alternatives for medications and treatment plan prescribed today were discussed, and the patient expressed understanding of the given instructions. Patient is instructed to call or message via MyChart if he/she has any questions or concerns regarding our treatment plan. No barriers to understanding were identified. We discussed Red Flag symptoms and  signs in detail. Patient expressed understanding regarding what to do in case of urgent or emergency type symptoms.  Medication list was reconciled, printed and provided to the patient in AVS. Patient instructions and summary information was reviewed with the patient as documented in the AVS. This note was prepared with assistance of Dragon voice recognition software. Occasional wrong-word or sound-a-like substitutions may have occurred due to the inherent limitations of voice recognition software

## 2022-07-28 NOTE — Patient Instructions (Signed)
Please return in 3 months for diabetes follow up   I will release your lab results to you on your MyChart account with further instructions. You may see the results before I do, but when I review them I will send you a message with my report or have my assistant call you if things need to be discussed. Please reply to my message with any questions. Thank you!   Stop the hctz and start the lasix with potassium for your swelling once daily as needed. Change to the new cream.   If you have any questions or concerns, please don't hesitate to send me a message via MyChart or call the office at [phone]. Thank you for visiting with [name] today! It's our pleasure caring for you.

## 2022-07-29 ENCOUNTER — Other Ambulatory Visit: Payer: Self-pay | Admitting: Family Medicine

## 2022-07-29 MED ORDER — POTASSIUM CHLORIDE CRYS ER 20 MEQ PO TBCR: 10.0000 meq | EXTENDED_RELEASE_TABLET | Freq: Every day | ORAL | 3 refills | Status: DC | PRN

## 2022-08-06 ENCOUNTER — Other Ambulatory Visit: Payer: Self-pay | Admitting: Family Medicine

## 2022-08-06 DIAGNOSIS — M25512 Pain in left shoulder: Secondary | ICD-10-CM | POA: Diagnosis not present

## 2022-08-12 ENCOUNTER — Other Ambulatory Visit: Payer: Self-pay | Admitting: Family Medicine

## 2022-08-12 ENCOUNTER — Other Ambulatory Visit: Payer: Self-pay | Admitting: Family

## 2022-08-12 DIAGNOSIS — H1031 Unspecified acute conjunctivitis, right eye: Secondary | ICD-10-CM

## 2022-08-24 ENCOUNTER — Other Ambulatory Visit: Payer: Self-pay | Admitting: Family Medicine

## 2022-08-25 ENCOUNTER — Other Ambulatory Visit: Payer: Self-pay

## 2022-09-23 DIAGNOSIS — M25612 Stiffness of left shoulder, not elsewhere classified: Secondary | ICD-10-CM | POA: Diagnosis not present

## 2022-09-23 DIAGNOSIS — M25512 Pain in left shoulder: Secondary | ICD-10-CM | POA: Diagnosis not present

## 2022-09-29 DIAGNOSIS — M25512 Pain in left shoulder: Secondary | ICD-10-CM | POA: Diagnosis not present

## 2022-09-29 DIAGNOSIS — M25612 Stiffness of left shoulder, not elsewhere classified: Secondary | ICD-10-CM | POA: Diagnosis not present

## 2022-10-07 DIAGNOSIS — H2513 Age-related nuclear cataract, bilateral: Secondary | ICD-10-CM | POA: Diagnosis not present

## 2022-10-07 DIAGNOSIS — E119 Type 2 diabetes mellitus without complications: Secondary | ICD-10-CM | POA: Diagnosis not present

## 2022-10-09 DIAGNOSIS — M25612 Stiffness of left shoulder, not elsewhere classified: Secondary | ICD-10-CM | POA: Diagnosis not present

## 2022-10-09 DIAGNOSIS — M25512 Pain in left shoulder: Secondary | ICD-10-CM | POA: Diagnosis not present

## 2022-10-14 DIAGNOSIS — M25512 Pain in left shoulder: Secondary | ICD-10-CM | POA: Diagnosis not present

## 2022-10-14 DIAGNOSIS — M25612 Stiffness of left shoulder, not elsewhere classified: Secondary | ICD-10-CM | POA: Diagnosis not present

## 2022-10-21 DIAGNOSIS — M25612 Stiffness of left shoulder, not elsewhere classified: Secondary | ICD-10-CM | POA: Diagnosis not present

## 2022-10-21 DIAGNOSIS — M25512 Pain in left shoulder: Secondary | ICD-10-CM | POA: Diagnosis not present

## 2022-10-22 DIAGNOSIS — M1711 Unilateral primary osteoarthritis, right knee: Secondary | ICD-10-CM | POA: Diagnosis not present

## 2022-10-23 ENCOUNTER — Other Ambulatory Visit: Payer: Self-pay | Admitting: Medical

## 2022-10-23 ENCOUNTER — Ambulatory Visit
Admission: RE | Admit: 2022-10-23 | Discharge: 2022-10-23 | Disposition: A | Discharge status: Home / Self Care | Payer: Medicare PPO | Source: Ambulatory Visit | Attending: Medical | Admitting: Medical

## 2022-10-23 DIAGNOSIS — W19XXXA Unspecified fall, initial encounter: Secondary | ICD-10-CM | POA: Diagnosis not present

## 2022-10-23 DIAGNOSIS — M25561 Pain in right knee: Secondary | ICD-10-CM

## 2022-10-23 DIAGNOSIS — M11261 Other chondrocalcinosis, right knee: Secondary | ICD-10-CM | POA: Diagnosis not present

## 2022-10-23 DIAGNOSIS — S8991XA Unspecified injury of right lower leg, initial encounter: Secondary | ICD-10-CM | POA: Diagnosis not present

## 2022-10-26 ENCOUNTER — Ambulatory Visit (INDEPENDENT_AMBULATORY_CARE_PROVIDER_SITE_OTHER): Payer: Medicare PPO | Admitting: Family Medicine

## 2022-10-26 VITALS — BP 108/70 | HR 70 | Temp 97.6°F | Ht 63.0 in | Wt 214.6 lb

## 2022-10-26 DIAGNOSIS — Z7984 Long term (current) use of oral hypoglycemic drugs: Secondary | ICD-10-CM | POA: Diagnosis not present

## 2022-10-26 DIAGNOSIS — L409 Psoriasis, unspecified: Secondary | ICD-10-CM

## 2022-10-26 DIAGNOSIS — D649 Anemia, unspecified: Secondary | ICD-10-CM

## 2022-10-26 DIAGNOSIS — E119 Type 2 diabetes mellitus without complications: Secondary | ICD-10-CM

## 2022-10-26 DIAGNOSIS — E1142 Type 2 diabetes mellitus with diabetic polyneuropathy: Secondary | ICD-10-CM | POA: Diagnosis not present

## 2022-10-26 DIAGNOSIS — M8580 Other specified disorders of bone density and structure, unspecified site: Secondary | ICD-10-CM

## 2022-10-26 DIAGNOSIS — S8011XD Contusion of right lower leg, subsequent encounter: Secondary | ICD-10-CM | POA: Diagnosis not present

## 2022-10-26 DIAGNOSIS — Z780 Asymptomatic menopausal state: Secondary | ICD-10-CM

## 2022-10-26 DIAGNOSIS — R600 Localized edema: Secondary | ICD-10-CM

## 2022-10-26 DIAGNOSIS — Z1231 Encounter for screening mammogram for malignant neoplasm of breast: Secondary | ICD-10-CM

## 2022-10-26 LAB — BASIC METABOLIC PANEL
BUN: 20 mg/dL (ref 6–23)
CO2: 25 mEq/L (ref 19–32)
Calcium: 9.5 mg/dL (ref 8.4–10.5)
Chloride: 105 mEq/L (ref 96–112)
Creatinine, Ser: 1.01 mg/dL (ref 0.40–1.20)
GFR: 55.65 mL/min — ABNORMAL LOW (ref >60.00)
Glucose, Bld: 133 mg/dL — ABNORMAL HIGH (ref 70–99)
Potassium: 4.1 mEq/L (ref 3.5–5.1)
Sodium: 142 mEq/L (ref 135–145)

## 2022-10-26 LAB — CBC WITH DIFFERENTIAL/PLATELET
Basophils Absolute: 0 10*3/uL (ref 0.0–0.1)
Basophils Relative: 0.6 % (ref 0.0–3.0)
Eosinophils Absolute: 0.2 10*3/uL (ref 0.0–0.7)
Eosinophils Relative: 2.7 % (ref 0.0–5.0)
HCT: 35.8 % — ABNORMAL LOW (ref 36.0–46.0)
Hemoglobin: 11.7 g/dL — ABNORMAL LOW (ref 12.0–15.0)
Lymphocytes Relative: 19.6 % (ref 12.0–46.0)
Lymphs Abs: 1.1 10*3/uL (ref 0.7–4.0)
MCHC: 32.6 g/dL (ref 30.0–36.0)
MCV: 84 fl (ref 78.0–100.0)
Monocytes Absolute: 0.4 10*3/uL (ref 0.1–1.0)
Monocytes Relative: 7.3 % (ref 3.0–12.0)
Neutro Abs: 3.9 10*3/uL (ref 1.4–7.7)
Neutrophils Relative %: 69.8 % (ref 43.0–77.0)
Platelets: 184 10*3/uL (ref 150.0–400.0)
RBC: 4.26 Mil/uL (ref 3.87–5.11)
RDW: 16.2 % — ABNORMAL HIGH (ref 11.5–15.5)
WBC: 5.7 10*3/uL (ref 4.0–10.5)

## 2022-10-26 LAB — POCT GLYCOSYLATED HEMOGLOBIN (HGB A1C): Hemoglobin A1C: 6.6 % — AB (ref 4.0–5.6)

## 2022-10-26 LAB — B12 AND FOLATE PANEL
Folate: 8.5 ng/mL (ref >5.9)
Vitamin B-12: 648 pg/mL (ref 211–911)

## 2022-10-26 MED ORDER — POTASSIUM CHLORIDE CRYS ER 20 MEQ PO TBCR: 20.0000 meq | EXTENDED_RELEASE_TABLET | Freq: Every day | ORAL | 3 refills | Status: DC | PRN

## 2022-10-26 MED ORDER — FUROSEMIDE 20 MG PO TABS: 40.0000 mg | ORAL_TABLET | Freq: Every day | ORAL | 3 refills | Status: DC | PRN

## 2022-10-26 MED ORDER — CLOBETASOL PROPIONATE 0.05 % EX SOLN: 1.0000 | Freq: Two times a day (BID) | CUTANEOUS | 6 refills | Status: DC

## 2022-10-26 NOTE — Progress Notes (Signed)
Subjective  CC:  Chief Complaint  Patient presents with   Diabetes   Recheck electrolytes   Fall    Pt stated that she fell on 10/21/2022 and hurt her knee    HPI: Miranda Mathis is a 72 y.o. female who presents to the office today for follow up of diabetes and problems listed above in the chief complaint.  Diabetes follow up: Her diabetic control is reported as Unchanged. Doing fine with metformin and diet. She denies exertional CP or SOB or symptomatic hypoglycemia. She denies foot sores or paresthesias.  Lower ext edema: started lasix 20 but no significant improvement.  Still with some edema.  No redness.  She did fall landing on her knees and hands last week.  Was evaluated orthopedics.  No fractures.  Still very sore on the right lateral lower leg.  Bruising is present.  Weight is down a few pounds. New anemia, normocytic found on labs in February.  Denies melena.  Normal diet.  No fatigue. Request clobetasol refill for psoriasis, mainly in the scalp area. Health maintenance: Will be due for mammogram and bone density.  Had to defer mammogram due to recovering from shoulder surgery.  Should be able to get now, will get in December with bone density to follow-up on osteopenia.  Wt Readings from Last 3 Encounters:  10/26/22 214 lb 9.6 oz (97.3 kg)  07/28/22 217 lb 12.8 oz (98.8 kg)  05/27/22 215 lb 12.8 oz (97.9 kg)    BP Readings from Last 3 Encounters:  10/26/22 108/70  07/28/22 122/60  05/27/22 126/70    Assessment  1. DM type 2 with diabetic peripheral neuropathy (HCC)   2. Psoriasis   3. Lower extremity edema   4. Normocytic anemia   5. Diabetes mellitus treated with oral medication (HCC)   6. Screening mammogram for breast cancer   7. Asymptomatic menopausal state   8. Osteopenia after menopause   9. Contusion of multiple sites of right lower extremity, subsequent encounter      Plan  Diabetes is currently well controlled.  Continue metformin.  Continue diet.  On  ACE due to chronic microalbuminuria. Psoriasis: Refill clobetasol Will start workup for normocytic anemia with repeat CBC, iron levels, B12 and folate. Conservative care for contusion of right lower extremity after fall.  Reassured. Lower extremity edema: Increase Lasix to 40 mg daily and increase K to work to 20 mg once daily.  Check renal panel today.  Follow up: 6 months to recheck blood pressure and diabetes Orders Placed This Encounter  Procedures   DG Bone Density   MM DIGITAL SCREENING BILATERAL   CBC with Differential/Platelet   B12 and Folate Panel   Iron, TIBC and Ferritin Panel   Basic metabolic panel   POCT HgB A1C   Meds ordered this encounter  Medications   clobetasol (TEMOVATE) 0.05 % external solution    Sig: Apply 1 Application topically 2 (two) times daily. Scalp    Dispense:  50 mL    Refill:  6   furosemide (LASIX) 20 MG tablet    Sig: Take 2 tablets (40 mg total) by mouth daily as needed for edema. Take with potassium    Dispense:  90 tablet    Refill:  3   potassium chloride SA (KLOR-CON M) 20 MEQ tablet    Sig: Take 1 tablet (20 mEq total) by mouth daily as needed (leg swelling). Take with furosemide    Dispense:  90 tablet  Refill:  3      Immunization History  Administered Date(s) Administered   COVID-19, mRNA, vaccine(Comirnaty)12 years and older 04/08/2022   Fluad Quad(high Dose 65+) 04/13/2019, 04/04/2020, 04/15/2021, 04/08/2022   H1N1 03/22/2021   Influenza Whole 03/22/2001, 03/22/2006, 03/22/2010   Influenza, High Dose Seasonal PF 02/19/2016, 06/02/2018, 04/08/2022   Influenza, Seasonal, Injecte, Preservative Fre 06/27/2012   Influenza-Unspecified 06/27/2012, 04/13/2019   Moderna Sars-Covid-2 Vaccination 08/14/2019   PFIZER(Purple Top)SARS-COV-2 Vaccination 07/17/2019, 07/24/2019, 05/08/2020   Pfizer Covid-19 Vaccine Bivalent Booster 25yrs & up 04/08/2022   Pneumococcal Conjugate-13 11/27/2015   Pneumococcal Polysaccharide-23 11/13/2010,  07/16/2021   Tdap 11/13/2010, 09/14/2020   Zoster Recombinat (Shingrix) 04/13/2019   Zoster, Live 11/27/2015    Diabetes Related Lab Review: Lab Results  Component Value Date   HGBA1C 6.6 (A) 10/26/2022   HGBA1C 6.7 (A) 07/28/2022   HGBA1C 6.5 (A) 04/30/2022    Lab Results  Component Value Date   MICROALBUR 1.4 05/01/2022   Lab Results  Component Value Date   CREATININE 1.09 07/28/2022   BUN 19 07/28/2022   NA 142 07/28/2022   K 4.0 07/28/2022   CL 102 07/28/2022   CO2 24 07/28/2022   Lab Results  Component Value Date   CHOL 105 07/28/2022   CHOL 122 07/16/2021   CHOL 123 10/16/2020   Lab Results  Component Value Date   HDL 38.70 (L) 07/28/2022   HDL 36.70 (L) 07/16/2021   HDL 41.60 10/16/2020   Lab Results  Component Value Date   LDLCALC 46 07/28/2022   LDLCALC 64 07/16/2021   LDLCALC 66 10/16/2020   Lab Results  Component Value Date   TRIG 102.0 07/28/2022   TRIG 108.0 07/16/2021   TRIG 75.0 10/16/2020   Lab Results  Component Value Date   CHOLHDL 3 07/28/2022   CHOLHDL 3 07/16/2021   CHOLHDL 3 10/16/2020   Lab Results  Component Value Date   LDLDIRECT 165.3 11/13/2010   LDLDIRECT 143.5 08/30/2008   The ASCVD Risk score (Arnett DK, et al., 2019) failed to calculate for the following reasons:   The valid total cholesterol range is 130 to 320 mg/dL I have reviewed the PMH, Fam and Soc history. Patient Active Problem List   Diagnosis Date Noted   Depression, major, single episode, moderate (HCC) 07/11/2020    Priority: High   DM type 2 with diabetic peripheral neuropathy (HCC) 09/02/2016    Priority: High    Diagnosed 2019; started meds 2020; + microalbunuria on ace    Recurrent kidney stones 08/29/2015    Priority: High    Overview:  S/p stents, multiple, [facility], Dr. Lenore Cordia    GAD (generalized anxiety disorder) 08/29/2015    Priority: High   Acquired hypothyroidism 11/02/2013    Priority: High   Mixed hyperlipidemia 04/04/2012     Priority: High   Fibromyalgia 10/11/2007    Priority: High   Chronic low back pain 10/11/2007    Priority: High   Chronic narcotic use 07/28/2022    Priority: Medium    Osteopenia after menopause 06/06/2019    Priority: Medium     dexa 2019 T = -1.9, multiple site osteopenia;  DEXA December 2022, osteopenia    Bilateral hydronephrosis     Priority: Medium    Degenerative scoliosis 09/07/2018    Priority: Medium    Psoriasis 12/02/2017    Priority: Medium    History of Barrett's esophagus 08/29/2015    Priority: Medium     Last EGD 2018, no  evidence of Barrett's esophagus, no further EGD recommended    GERD (gastroesophageal reflux disease) 08/29/2015    Priority: Medium    Irritable bowel syndrome with constipation 08/29/2015    Priority: Medium    Lumbosacral spondylosis with radiculopathy 08/29/2015    Priority: Medium    Barrett's esophagus with esophagitis 08/29/2015    Priority: Medium     Formatting of this note might be different from the original. Last EGD 2018, no evidence of Barrett's esophagus, no further EGD recommended    Primary insomnia 10/25/2014    Priority: Medium     Overview:  Last Assessment & Plan:  Insomnia is stable with current regimen of Ambien. Continue current dosage of Ambien.    Seborrheic dermatitis of scalp 12/02/2017    Priority: Low   Allergic rhinitis 10/11/2007    Priority: Low    Qualifier: Diagnosis of  By: Briscoe Burns CMA, Alvy Beal      Lower extremity edema 04/15/2021    Social History: Patient  reports that she has never smoked. She has never used smokeless tobacco. She reports current alcohol use of about 1.0 standard drink of alcohol per week. She reports that she does not use drugs.  Review of Systems: Ophthalmic: negative for eye pain, loss of vision or double vision Cardiovascular: negative for chest pain Respiratory: negative for SOB or persistent cough Gastrointestinal: negative for abdominal pain Genitourinary:  negative for dysuria or gross hematuria MSK: negative for foot lesions Neurologic: negative for weakness or gait disturbance  Objective  Vitals: BP 108/70   Pulse 70   Temp 97.6 F (36.4 C)   Ht 5\' 3"  (1.6 m)   Wt 214 lb 9.6 oz (97.3 kg)   SpO2 97%   BMI 38.01 kg/m  General: well appearing, no acute distress  Psych:  Alert and oriented, normal mood and affect HEENT:  Normocephalic, atraumatic, moist mucous membranes, supple neck  Cardiovascular:  Nl S1 and S2, RRR without murmur, gallop or rub.  +1 pitting edema bilaterally  Respiratory:  Good breath sounds bilaterally, CTAB with normal effort, no rales Neurologic:   Mental status is normal. normal gait Foot exam: no erythema, pallor, or cyanosis visible nl proprioception and sensation to monofilament testing bilaterally, +2 distal pulses bilaterally Right lower extremity with lateral hematoma and ecchymosis present.  Some swelling.  No redness or warmth    Diabetic education: ongoing education regarding chronic disease management for diabetes was given today. We continue to reinforce the ABC's of diabetic management: A1c (<7 or 8 dependent upon patient), tight blood pressure control, and cholesterol management with goal LDL < 100 minimally. We discuss diet strategies, exercise recommendations, medication options and possible side effects. At each visit, we review recommended immunizations and preventive care recommendations for diabetics and stress that good diabetic control can prevent other problems. See below for this patient's data.   Commons side effects, risks, benefits, and alternatives for medications and treatment plan prescribed today were discussed, and the patient expressed understanding of the given instructions. Patient is instructed to call or message via MyChart if he/she has any questions or concerns regarding our treatment plan. No barriers to understanding were identified. We discussed Red Flag symptoms and signs in  detail. Patient expressed understanding regarding what to do in case of urgent or emergency type symptoms.  Medication list was reconciled, printed and provided to the patient in AVS. Patient instructions and summary information was reviewed with the patient as documented in the AVS. This note was prepared with  assistance of Conservation officer, historic buildings. Occasional wrong-word or sound-a-like substitutions may have occurred due to the inherent limitations of voice recognition software

## 2022-10-26 NOTE — Patient Instructions (Signed)
Please return in 3 months to recheck diabetes and blood pressure    I will release your lab results to you on your MyChart account with further instructions. You may see the results before I do, but when I review them I will send you a message with my report or have my assistant call you if things need to be discussed. Please reply to my message with any questions. Thank you!   If you have any questions or concerns, please don't hesitate to send me a message via MyChart or call the office at [phone]. Thank you for visiting with [name] today! It's our pleasure caring for you.   Please call the office checked below to schedule your appointment for your mammogram and/or bone density screen (the checked studies were ordered): both can be done in December 2024 [x]   Mammogram  [x]   Bone Density  [x]   [facility]     [address] [place], [place]        [phone]         []   [facility]  [address] [place], [place]  [phone]

## 2022-10-27 LAB — IRON,TIBC AND FERRITIN PANEL
%SAT: 11 % (calc) — ABNORMAL LOW (ref 16–45)
Ferritin: 27 ng/mL (ref 16–288)
Iron: 49 ug/dL (ref 45–160)
TIBC: 427 mcg/dL (calc) (ref 250–450)

## 2022-10-28 DIAGNOSIS — M25512 Pain in left shoulder: Secondary | ICD-10-CM | POA: Diagnosis not present

## 2022-10-28 DIAGNOSIS — M25612 Stiffness of left shoulder, not elsewhere classified: Secondary | ICD-10-CM | POA: Diagnosis not present

## 2022-10-28 NOTE — Progress Notes (Signed)
Please call patient: I have reviewed his/her lab results. Labs confirm iron deficiency anemia; I recommend otc iron twice daily and calling GI to get in to have colonoscopy done a little early. Other labs are fine.

## 2022-10-30 DIAGNOSIS — S8001XA Contusion of right knee, initial encounter: Secondary | ICD-10-CM | POA: Diagnosis not present

## 2022-10-30 DIAGNOSIS — M1711 Unilateral primary osteoarthritis, right knee: Secondary | ICD-10-CM | POA: Diagnosis not present

## 2022-11-02 DIAGNOSIS — M25612 Stiffness of left shoulder, not elsewhere classified: Secondary | ICD-10-CM | POA: Diagnosis not present

## 2022-11-02 DIAGNOSIS — M25512 Pain in left shoulder: Secondary | ICD-10-CM | POA: Diagnosis not present

## 2022-11-03 DIAGNOSIS — M25512 Pain in left shoulder: Secondary | ICD-10-CM | POA: Diagnosis not present

## 2022-11-10 DIAGNOSIS — M25512 Pain in left shoulder: Secondary | ICD-10-CM | POA: Diagnosis not present

## 2022-11-10 DIAGNOSIS — M25612 Stiffness of left shoulder, not elsewhere classified: Secondary | ICD-10-CM | POA: Diagnosis not present

## 2022-11-11 ENCOUNTER — Other Ambulatory Visit: Payer: Self-pay | Admitting: Family Medicine

## 2022-11-17 DIAGNOSIS — M25512 Pain in left shoulder: Secondary | ICD-10-CM | POA: Diagnosis not present

## 2022-11-17 DIAGNOSIS — M25612 Stiffness of left shoulder, not elsewhere classified: Secondary | ICD-10-CM | POA: Diagnosis not present

## 2022-11-23 DIAGNOSIS — M25612 Stiffness of left shoulder, not elsewhere classified: Secondary | ICD-10-CM | POA: Diagnosis not present

## 2022-11-23 DIAGNOSIS — M25512 Pain in left shoulder: Secondary | ICD-10-CM | POA: Diagnosis not present

## 2023-01-26 ENCOUNTER — Encounter: Payer: Self-pay | Admitting: Family Medicine

## 2023-01-26 ENCOUNTER — Ambulatory Visit: Payer: Medicare HMO | Admitting: Family Medicine

## 2023-01-26 VITALS — BP 106/70 | HR 75 | Temp 97.6°F | Ht 63.0 in | Wt 218.4 lb

## 2023-01-26 DIAGNOSIS — D509 Iron deficiency anemia, unspecified: Secondary | ICD-10-CM

## 2023-01-26 DIAGNOSIS — E119 Type 2 diabetes mellitus without complications: Secondary | ICD-10-CM

## 2023-01-26 DIAGNOSIS — R600 Localized edema: Secondary | ICD-10-CM

## 2023-01-26 DIAGNOSIS — D126 Benign neoplasm of colon, unspecified: Secondary | ICD-10-CM | POA: Diagnosis not present

## 2023-01-26 DIAGNOSIS — E1142 Type 2 diabetes mellitus with diabetic polyneuropathy: Secondary | ICD-10-CM | POA: Diagnosis not present

## 2023-01-26 DIAGNOSIS — E039 Hypothyroidism, unspecified: Secondary | ICD-10-CM | POA: Diagnosis not present

## 2023-01-26 DIAGNOSIS — Z7984 Long term (current) use of oral hypoglycemic drugs: Secondary | ICD-10-CM | POA: Diagnosis not present

## 2023-01-26 LAB — CBC WITH DIFFERENTIAL/PLATELET
Basophils Absolute: 0 10*3/uL (ref 0.0–0.1)
Basophils Relative: 0.6 % (ref 0.0–3.0)
Eosinophils Absolute: 0.2 10*3/uL (ref 0.0–0.7)
Eosinophils Relative: 3.9 % (ref 0.0–5.0)
HCT: 41.4 % (ref 36.0–46.0)
Hemoglobin: 13 g/dL (ref 12.0–15.0)
Lymphocytes Relative: 17.7 % (ref 12.0–46.0)
Lymphs Abs: 0.9 10*3/uL (ref 0.7–4.0)
MCHC: 31.5 g/dL (ref 30.0–36.0)
MCV: 85.4 fl (ref 78.0–100.0)
Monocytes Absolute: 0.4 10*3/uL (ref 0.1–1.0)
Monocytes Relative: 6.9 % (ref 3.0–12.0)
Neutro Abs: 3.8 10*3/uL (ref 1.4–7.7)
Neutrophils Relative %: 70.9 % (ref 43.0–77.0)
Platelets: 172 10*3/uL (ref 150.0–400.0)
RBC: 4.84 Mil/uL (ref 3.87–5.11)
RDW: 15.8 % — ABNORMAL HIGH (ref 11.5–15.5)
WBC: 5.3 10*3/uL (ref 4.0–10.5)

## 2023-01-26 LAB — BASIC METABOLIC PANEL
BUN: 13 mg/dL (ref 6–23)
CO2: 24 mEq/L (ref 19–32)
Calcium: 9.3 mg/dL (ref 8.4–10.5)
Chloride: 106 mEq/L (ref 96–112)
Creatinine, Ser: 1 mg/dL (ref 0.40–1.20)
GFR: 56.22 mL/min — ABNORMAL LOW (ref >60.00)
Glucose, Bld: 150 mg/dL — ABNORMAL HIGH (ref 70–99)
Potassium: 4.4 mEq/L (ref 3.5–5.1)
Sodium: 139 mEq/L (ref 135–145)

## 2023-01-26 LAB — POCT GLYCOSYLATED HEMOGLOBIN (HGB A1C): Hemoglobin A1C: 6.7 % — AB (ref 4.0–5.6)

## 2023-01-26 LAB — TSH: TSH: 15.52 u[IU]/mL — ABNORMAL HIGH (ref 0.35–5.50)

## 2023-01-26 LAB — MICROALBUMIN / CREATININE URINE RATIO
Creatinine,U: 89.7 mg/dL
Microalb Creat Ratio: 1 mg/g (ref 0.0–30.0)
Microalb, Ur: 0.9 mg/dL (ref 0.0–1.9)

## 2023-01-26 MED ORDER — LISINOPRIL 5 MG PO TABS: 2.5000 mg | ORAL_TABLET | Freq: Every day | ORAL | Status: DC

## 2023-01-26 NOTE — Patient Instructions (Signed)
Please return in 3 months to recheck diabetes and blood pressure    Please call Dr. Marvell Fuller office to get scheduled for your colonoscopy. You need this now due to your history of adenomatous polyps and now because of iron deficiency anemia.   Please call the office checked below to schedule your appointment for your mammogram and/or bone density screen (the checked studies were ordered): [x]   Mammogram  []   Bone Density  []   [facility]     [address] [place], [place]        [phone]         [x]   [facility]  [address] [place], [place]  [phone]   If you have any questions or concerns, please don't hesitate to send me a message via MyChart or call the office at [phone]. Thank you for visiting with [name] today! It's our pleasure caring for you.

## 2023-01-26 NOTE — Progress Notes (Signed)
Subjective  CC:  Chief Complaint  Patient presents with   Diabetes   Hypertension    HPI: Miranda Mathis is a 72 y.o. female who presents to the office today for follow up of diabetes and problems listed above in the chief complaint.  Diabetes follow up: Her diabetic control is reported as Unchanged.  Tolerating her medications.  She denies exertional CP or SOB or symptomatic hypoglycemia. She denies foot sores or paresthesias.  On gabapentin for peripheral neuropathy.  Eye exam current.  She takes lisinopril 5 mg daily for renal protection.  Had history of microalbuminuria in 2019.  Has resolved with ACE inhibitor.  However feeling lightheaded and she thinks her blood pressure is running too low.  Sometimes describes orthostatic hypotension.  No palpitations or chest pain.  Takes Lasix intermittently for lower extremity edema and this works well.  Rarely uses 40 mg.  Taking 20 mg about 3 times per week. Iron deficiency anemia: He has history of Barrett's esophagus and tubular adenomatous polyps.  She is due for colonoscopy.  She is now on iron twice daily tolerating it fairly well.  Due for recheck.  Anemia was mild. Hypothyroidism: Complains of lightheadedness and gets winded easily.  Living sedentary lifestyle.  No change in lower extremity edema, skin or hair.  Sleep is fair  Wt Readings from Last 3 Encounters:  01/26/23 218 lb 6.4 oz (99.1 kg)  10/26/22 214 lb 9.6 oz (97.3 kg)  07/28/22 217 lb 12.8 oz (98.8 kg)    BP Readings from Last 3 Encounters:  01/26/23 106/70  10/26/22 108/70  07/28/22 122/60    Assessment  1. DM type 2 with diabetic peripheral neuropathy (HCC)   2. Iron deficiency anemia, unspecified iron deficiency anemia type   3. Acquired hypothyroidism   4. Lower extremity edema   5. Diabetes mellitus treated with oral medication (HCC)   6. Tubular adenoma of colon      Plan  Diabetes is currently very well controlled.  Continue current  medications. Lightheadedness: Blood pressure looks good in office today, no history of hypertension.  Will decrease lisinopril to 2.5 mg daily to see if this helps her symptoms.  Recheck urine microalbuminuria Recheck thyroid levels given symptoms above.  Want to ensure stability Lower extremity edema is well-controlled on intermittent use of Lasix 20 to 40 mg.  Check potassium and renal function today her weight is stable Recommend calling Dr. Marvell Fuller office to get scheduled for her now due colonoscopy, also needs workup for iron deficiency anemia Recheck iron levels and CBC today on oral iron supplements Health maintenance: She is overdue for mammogram and now able to get scheduled.  I gave her the number to call.  She uses [facility]  I spent a total of 42 minutes for this patient encounter. Time spent included preparation, face-to-face counseling with the patient and coordination of care, review of chart and records, and documentation of the encounter.  Follow up: 3 months to recheck diabetes and blood pressure Orders Placed This Encounter  Procedures   Basic metabolic panel   Iron, TIBC and Ferritin Panel   CBC with Differential/Platelet   TSH   Microalbumin / creatinine urine ratio   POCT HgB A1C   Meds ordered this encounter  Medications   lisinopril (ZESTRIL) 5 MG tablet    Sig: Take 0.5 tablets (2.5 mg total) by mouth daily.      Immunization History  Administered Date(s) Administered   COVID-19, mRNA, vaccine(Comirnaty)12 years  and older 04/08/2022   Fluad Quad(high Dose 65+) 04/13/2019, 04/04/2020, 04/15/2021, 04/08/2022   H1N1 03/22/2021   Influenza Whole 03/22/2001, 03/22/2006, 03/22/2010   Influenza, High Dose Seasonal PF 02/19/2016, 06/02/2018, 04/08/2022   Influenza, Seasonal, Injecte, Preservative Fre 06/27/2012   Influenza-Unspecified 06/27/2012, 04/13/2019   Moderna Sars-Covid-2 Vaccination 08/14/2019   PFIZER(Purple Top)SARS-COV-2 Vaccination  07/17/2019, 07/24/2019, 05/08/2020   Pfizer Covid-19 Vaccine Bivalent Booster 78yrs & up 04/08/2022   Pneumococcal Conjugate-13 11/27/2015   Pneumococcal Polysaccharide-23 11/13/2010, 07/16/2021   Tdap 11/13/2010, 09/14/2020   Zoster Recombinant(Shingrix) 04/13/2019   Zoster, Live 11/27/2015    Diabetes Related Lab Review: Lab Results  Component Value Date   HGBA1C 6.7 (A) 01/26/2023   HGBA1C 6.6 (A) 10/26/2022   HGBA1C 6.7 (A) 07/28/2022    Lab Results  Component Value Date   MICROALBUR 1.4 05/01/2022   Lab Results  Component Value Date   CREATININE 1.01 10/26/2022   BUN 20 10/26/2022   NA 142 10/26/2022   K 4.1 10/26/2022   CL 105 10/26/2022   CO2 25 10/26/2022   Lab Results  Component Value Date   CHOL 105 07/28/2022   CHOL 122 07/16/2021   CHOL 123 10/16/2020   Lab Results  Component Value Date   HDL 38.70 (L) 07/28/2022   HDL 36.70 (L) 07/16/2021   HDL 41.60 10/16/2020   Lab Results  Component Value Date   LDLCALC 46 07/28/2022   LDLCALC 64 07/16/2021   LDLCALC 66 10/16/2020   Lab Results  Component Value Date   TRIG 102.0 07/28/2022   TRIG 108.0 07/16/2021   TRIG 75.0 10/16/2020   Lab Results  Component Value Date   CHOLHDL 3 07/28/2022   CHOLHDL 3 07/16/2021   CHOLHDL 3 10/16/2020   Lab Results  Component Value Date   LDLDIRECT 165.3 11/13/2010   LDLDIRECT 143.5 08/30/2008   The ASCVD Risk score (Arnett DK, et al., 2019) failed to calculate for the following reasons:   The valid total cholesterol range is 130 to 320 mg/dL I have reviewed the PMH, Fam and Soc history. Patient Active Problem List   Diagnosis Date Noted Date Diagnosed   Depression, major, single episode, moderate (HCC) 07/11/2020     Priority: High   DM type 2 with diabetic peripheral neuropathy (HCC) 09/02/2016     Priority: High    Diagnosed 2019; started meds 2020; + microalbunuria on ace    Recurrent kidney stones 08/29/2015     Priority: High    Overview:  S/p  stents, multiple, [facility], Dr. Lenore Cordia    GAD (generalized anxiety disorder) 08/29/2015     Priority: High   Acquired hypothyroidism 11/02/2013     Priority: High   Mixed hyperlipidemia 04/04/2012     Priority: High   Fibromyalgia 10/11/2007     Priority: High   Chronic low back pain 10/11/2007     Priority: High   Chronic narcotic use 07/28/2022     Priority: Medium    Osteopenia after menopause 06/06/2019     Priority: Medium     dexa 2019 T = -1.9, multiple site osteopenia;  DEXA December 2022, osteopenia    Bilateral hydronephrosis      Priority: Medium    Degenerative scoliosis 09/07/2018     Priority: Medium    Psoriasis 12/02/2017     Priority: Medium    History of Barrett's esophagus 08/29/2015     Priority: Medium     Last EGD 2018, no evidence of Barrett's esophagus, no  further EGD recommended    GERD (gastroesophageal reflux disease) 08/29/2015     Priority: Medium    Irritable bowel syndrome with constipation 08/29/2015     Priority: Medium    Lumbosacral spondylosis with radiculopathy 08/29/2015     Priority: Medium    Barrett's esophagus with esophagitis 08/29/2015     Priority: Medium     Formatting of this note might be different from the original. Last EGD 2018, no evidence of Barrett's esophagus, no further EGD recommended    Primary insomnia 10/25/2014     Priority: Medium     Overview:  Last Assessment & Plan:  Insomnia is stable with current regimen of Ambien. Continue current dosage of Ambien.    Seborrheic dermatitis of scalp 12/02/2017     Priority: Low   Allergic rhinitis 10/11/2007     Priority: Low    Qualifier: Diagnosis of  By: Briscoe Burns CMA, Alvy Beal      Tubular adenoma of colon 01/26/2023     Dr. Leone Payor, colonoscopy 2021. Repeat in 3 years.    Lower extremity edema 04/15/2021     Social History: Patient  reports that she has never smoked. She has never used smokeless tobacco. She reports current alcohol use of about 1.0  standard drink of alcohol per week. She reports that she does not use drugs.  Review of Systems: Ophthalmic: negative for eye pain, loss of vision or double vision Cardiovascular: negative for chest pain Respiratory: negative for SOB or persistent cough Gastrointestinal: negative for abdominal pain Genitourinary: negative for dysuria or gross hematuria MSK: negative for foot lesions Neurologic: negative for weakness or gait disturbance  Objective  Vitals: BP 106/70   Pulse 75   Temp 97.6 F (36.4 C)   Ht 5\' 3"  (1.6 m)   Wt 218 lb 6.4 oz (99.1 kg)   SpO2 96%   BMI 38.69 kg/m  General: well appearing, no acute distress  Psych:  Alert and oriented, normal mood and affect HEENT:  Normocephalic, atraumatic, moist mucous membranes, supple neck  Cardiovascular:  Nl S1 and S2, RRR without murmur, gallop or rub.  Trace edema bilaterally Respiratory:  Good breath sounds bilaterally, CTAB with normal effort, no rales Foot exam: no erythema, pallor, or cyanosis visible nl proprioception and sensation to monofilament testing bilaterally, +2 distal pulses bilaterally    Diabetic education: ongoing education regarding chronic disease management for diabetes was given today. We continue to reinforce the ABC's of diabetic management: A1c (<7 or 8 dependent upon patient), tight blood pressure control, and cholesterol management with goal LDL < 100 minimally. We discuss diet strategies, exercise recommendations, medication options and possible side effects. At each visit, we review recommended immunizations and preventive care recommendations for diabetics and stress that good diabetic control can prevent other problems. See below for this patient's data.   Commons side effects, risks, benefits, and alternatives for medications and treatment plan prescribed today were discussed, and the patient expressed understanding of the given instructions. Patient is instructed to call or message via MyChart if  he/she has any questions or concerns regarding our treatment plan. No barriers to understanding were identified. We discussed Red Flag symptoms and signs in detail. Patient expressed understanding regarding what to do in case of urgent or emergency type symptoms.  Medication list was reconciled, printed and provided to the patient in AVS. Patient instructions and summary information was reviewed with the patient as documented in the AVS. This note was prepared with assistance of Dragon voice recognition  software. Occasional wrong-word or sound-a-like substitutions may have occurred due to the inherent limitations of voice recognition software

## 2023-02-04 ENCOUNTER — Other Ambulatory Visit: Payer: Self-pay | Admitting: Family Medicine

## 2023-02-13 ENCOUNTER — Encounter: Payer: Self-pay | Admitting: Family Medicine

## 2023-02-15 ENCOUNTER — Other Ambulatory Visit: Payer: Self-pay | Admitting: Family Medicine

## 2023-02-15 NOTE — Telephone Encounter (Signed)
Please see patient response to TSH labs. According to patient has not missed any doses. Please advise

## 2023-02-16 ENCOUNTER — Other Ambulatory Visit: Payer: Self-pay

## 2023-02-16 DIAGNOSIS — E039 Hypothyroidism, unspecified: Secondary | ICD-10-CM

## 2023-02-16 MED ORDER — LEVOTHYROXINE SODIUM 25 MCG PO TABS: 25.0000 ug | ORAL_TABLET | Freq: Every day | ORAL | 3 refills | Status: DC

## 2023-02-16 NOTE — Telephone Encounter (Signed)
Responded via other message to increase dose. See ther

## 2023-02-16 NOTE — Telephone Encounter (Signed)
Spoke with pt regarding lab results/recommendations. Alls future labs has been placed.

## 2023-03-16 DIAGNOSIS — M25562 Pain in left knee: Secondary | ICD-10-CM | POA: Diagnosis not present

## 2023-03-16 DIAGNOSIS — M25561 Pain in right knee: Secondary | ICD-10-CM | POA: Diagnosis not present

## 2023-04-11 ENCOUNTER — Other Ambulatory Visit: Payer: Self-pay | Admitting: Family Medicine

## 2023-04-19 ENCOUNTER — Other Ambulatory Visit: Payer: Self-pay | Admitting: Family Medicine

## 2023-04-19 ENCOUNTER — Other Ambulatory Visit (INDEPENDENT_AMBULATORY_CARE_PROVIDER_SITE_OTHER): Payer: Medicare HMO

## 2023-04-19 DIAGNOSIS — E039 Hypothyroidism, unspecified: Secondary | ICD-10-CM | POA: Diagnosis not present

## 2023-04-20 LAB — TSH: TSH: 0.51 u[IU]/mL (ref 0.35–5.50)

## 2023-04-20 NOTE — Progress Notes (Signed)
See my chart note.

## 2023-04-21 ENCOUNTER — Encounter: Payer: Self-pay | Admitting: Family Medicine

## 2023-05-04 ENCOUNTER — Encounter: Payer: Self-pay | Admitting: Internal Medicine

## 2023-05-13 ENCOUNTER — Ambulatory Visit: Payer: Medicare HMO | Admitting: Family Medicine

## 2023-05-13 ENCOUNTER — Encounter: Payer: Self-pay | Admitting: Family Medicine

## 2023-05-13 VITALS — BP 110/70 | HR 83 | Temp 97.8°F | Ht 63.0 in | Wt 221.0 lb

## 2023-05-13 DIAGNOSIS — Z7984 Long term (current) use of oral hypoglycemic drugs: Secondary | ICD-10-CM

## 2023-05-13 DIAGNOSIS — K209 Esophagitis, unspecified without bleeding: Secondary | ICD-10-CM | POA: Diagnosis not present

## 2023-05-13 DIAGNOSIS — D126 Benign neoplasm of colon, unspecified: Secondary | ICD-10-CM

## 2023-05-13 DIAGNOSIS — J0100 Acute maxillary sinusitis, unspecified: Secondary | ICD-10-CM

## 2023-05-13 DIAGNOSIS — E039 Hypothyroidism, unspecified: Secondary | ICD-10-CM | POA: Diagnosis not present

## 2023-05-13 DIAGNOSIS — N1831 Chronic kidney disease, stage 3a: Secondary | ICD-10-CM | POA: Diagnosis not present

## 2023-05-13 DIAGNOSIS — E1142 Type 2 diabetes mellitus with diabetic polyneuropathy: Secondary | ICD-10-CM

## 2023-05-13 DIAGNOSIS — K2270 Barrett's esophagus without dysplasia: Secondary | ICD-10-CM

## 2023-05-13 LAB — POCT GLYCOSYLATED HEMOGLOBIN (HGB A1C): Hemoglobin A1C: 7.1 % — AB (ref 4.0–5.6)

## 2023-05-13 MED ORDER — AMOXICILLIN-POT CLAVULANATE 875-125 MG PO TABS: 1.0000 | ORAL_TABLET | Freq: Two times a day (BID) | ORAL | 0 refills | Status: AC

## 2023-05-13 MED ORDER — DAPAGLIFLOZIN PROPANEDIOL 10 MG PO TABS: 10.0000 mg | ORAL_TABLET | Freq: Every day | ORAL | 3 refills | Status: DC

## 2023-05-13 NOTE — Patient Instructions (Addendum)
Please return in 3 months for diabetes follow up and recheck thyroid levels.   If you have any questions or concerns, please don't hesitate to send me a message via MyChart or call the office at [phone]. Thank you for visiting with Miranda Mathis today! It's our pleasure caring for you.    VISIT SUMMARY:  During today's visit, we addressed your persistent headache, diabetes management, blood pressure, and thyroid condition. We also discussed general health maintenance and provided recommendations to help manage your conditions effectively.  Please contact Dr. Marvell Fuller office to schedule your overdue colonoscopy and follow-up on your Barrett's esophagus.  Also please call [facility] to set up a mammogram.  YOUR PLAN:  -SINUSITIS: Sinusitis is an inflammation of the sinuses that can cause headaches, facial pressure, and nasal drainage. We have prescribed sinus medication and an antibiotic to help alleviate your symptoms.  -TYPE 2 DIABETES MELLITUS: Type 2 Diabetes Mellitus is a condition where your body does not use insulin properly, leading to high blood sugar levels. Your HbA1c is slightly elevated, indicating that your blood sugar control needs improvement. We discussed dietary changes and provided a sample of an SGLT2 inhibitor to help manage your blood sugar and protect your kidneys. Please reduce high-sugar fruits and incorporate low-sugar snacks into your diet. Clean after voiding to prevent yeast infections.   -HYPERTENSION: Hypertension, or high blood pressure, can lead to serious health issues if not managed properly. Your blood pressure is currently elevated, possibly due to pain. We are switching your medication from lisinopril to [place], which may help with both blood pressure and swelling. Continue taking Lasix 40 mg daily and monitor your blood pressure and symptoms.  -HYPOTHYROIDISM: Hypothyroidism is a condition where your thyroid gland does not produce enough thyroid hormone.  Your recent labs show that your thyroid levels have normalized with the new medication dosage. Continue with your current thyroid medication and monitor your thyroid levels regularly.  -GENERAL HEALTH MAINTENANCE: We discussed dietary recommendations and hygiene practices to prevent complications from diabetes medication. Using baby wipes for hygiene can help prevent yeast infections due to increased sugar in urine.  INSTRUCTIONS:  Please follow up in 3 months to review lab results and adjust your treatment as necessary.

## 2023-05-13 NOTE — Progress Notes (Signed)
Subjective  CC:  Chief Complaint  Patient presents with   Diabetes    HPI: Miranda Mathis is a 72 y.o. female who presents to the office today for follow up of diabetes and problems listed above in the chief complaint.  Discussed the use of AI scribe software for clinical note transcription with the patient, who gave verbal consent to proceed.  History of Present Illness   The patient, here for f/u on diabetes and hypothyroidism  presents with a chief complaint of a persistent headache for the past three days. The headache is described as a pressure-like sensation, predominantly in the frontal region. The patient also reports facial pressure and occasional postnasal drainage, suggestive of sinus congestion. She has a history of sinus infections, often associated with headaches, but it has been a while since the last episode.  The patient's diabetes control appears suboptimal, with a recent HbA1c of 7.1-7.3. She reports adherence to metformin and attempts to improve her diet, including the incorporation of more fruits at night. However, she struggles with weight control, which has increased slightly.  The patient also reports lightheadedness, and was unable to lower the dose of the lisinopril because the pill was too small to cut in half.  no cp. leg swelling is stable on daily lasix/potassium 40/20.   The patient's thyroid disease is reportedly under better control with a recent change in medication, as indicated by a significant reduction in her thyroid levels. She has not reported any adverse effects from the new thyroid medication. last tsh 0.51      Wt Readings from Last 3 Encounters:  05/13/23 221 lb (100.2 kg)  01/26/23 218 lb 6.4 oz (99.1 kg)  10/26/22 214 lb 9.6 oz (97.3 kg)    BP Readings from Last 3 Encounters:  05/13/23 110/70  01/26/23 106/70  10/26/22 108/70    Assessment  1. DM type 2 with diabetic peripheral neuropathy (HCC)   2. Acquired hypothyroidism   3.  Stage 3a chronic kidney disease (HCC)   4. Tubular adenoma of colon   5. Barrett's esophagus with esophagitis      Plan  Assessment and Plan    Sinusitis 3-day history of frontal headache with facial pressure and post-nasal drainage, consistent with sinusitis. No significant congestion noted. - Prescribe sinus medication - Prescribe antibiotic  Type 2 Diabetes Mellitus HbA1c elevated at 7.1%. Reports difficulty managing diet and weight. Current medication: metformin twice daily. Discussed dietary modifications and potential addition of SGLT2 inhibitor for glycemic control and kidney protection. Risks include increased yeast infections due to higher sugar in urine. Plan to provide a sample to assess tolerance before switching to a combination pill. - Provide sample of SGLT2 inhibitor - Discuss dietary modifications (reduce high-sugar fruits, incorporate low-sugar snacks)  Hypertension Blood pressure elevated, likely due to pain. Reports lightheadedness and difficulty splitting current medication. Current medications: lisinopril and Lasix. Plan to switch from lisinopril to [place], which may help with blood pressure and swelling. Risks include increased yeast infections due to higher sugar in urine. - Discontinue lisinopril - Prescribe Farxiga - Continue Lasix 40 mg daily - Monitor blood pressure and symptoms  Hypothyroidism Improvement with new thyroid medication dosage. Recent labs indicate normalized thyroid levels. - Continue current thyroid medication dosage - Monitor thyroid levels regularly  General Health Maintenance Discussed dietary recommendations and hygiene practices to prevent complications from diabetes medication. - Recommend using baby wipes for hygiene to prevent yeast infections due to increased sugar in urine -schedule  overdue mammogram with [facility] and call GI: overdue for recheck barretts and colonoscopy given h/o tubular adenoma.   Follow-up - Follow up in 3  months for diabetes and thyroid recheck - Review lab results and adjust treatment as necessary.      Orders Placed This Encounter  Procedures   POCT HgB A1C   Meds ordered this encounter  Medications   amoxicillin-clavulanate (AUGMENTIN) 875-125 MG tablet    Sig: Take 1 tablet by mouth 2 (two) times daily for 10 days.    Dispense:  20 tablet    Refill:  0   dapagliflozin propanediol (FARXIGA) 10 MG TABS tablet    Sig: Take 1 tablet (10 mg total) by mouth daily.    Dispense:  90 tablet    Refill:  3      Immunization History  Administered Date(s) Administered   Fluad Quad(high Dose 65+) 04/13/2019, 04/04/2020, 04/15/2021, 04/08/2022   Fluad Trivalent(High Dose 65+) 05/09/2023   H1N1 03/22/2021   Influenza Whole 03/22/2001, 03/22/2006, 03/22/2010   Influenza, High Dose Seasonal PF 02/19/2016, 06/02/2018, 04/08/2022   Influenza, Seasonal, Injecte, Preservative Fre 06/27/2012   Influenza-Unspecified 06/27/2012, 04/13/2019   Moderna Sars-Covid-2 Vaccination 08/14/2019   PFIZER(Purple Top)SARS-COV-2 Vaccination 07/17/2019, 07/24/2019, 05/08/2020   Pfizer Covid-19 Vaccine Bivalent Booster 71yrs & up 04/08/2022, 05/09/2023   Pfizer(Comirnaty)Fall Seasonal Vaccine 12 years and older 04/08/2022   Pneumococcal Conjugate-13 11/27/2015   Pneumococcal Polysaccharide-23 11/13/2010, 07/16/2021   Tdap 11/13/2010, 09/14/2020   Zoster Recombinant(Shingrix) 04/13/2019   Zoster, Live 11/27/2015    Diabetes Related Lab Review: Lab Results  Component Value Date   HGBA1C 7.1 (A) 05/13/2023   HGBA1C 6.7 (A) 01/26/2023   HGBA1C 6.6 (A) 10/26/2022    Lab Results  Component Value Date   MICROALBUR 0.9 01/26/2023   Lab Results  Component Value Date   CREATININE 1.00 01/26/2023   BUN 13 01/26/2023   NA 139 01/26/2023   K 4.4 01/26/2023   CL 106 01/26/2023   CO2 24 01/26/2023   Lab Results  Component Value Date   CHOL 105 07/28/2022   CHOL 122 07/16/2021   CHOL 123 10/16/2020    Lab Results  Component Value Date   HDL 38.70 (L) 07/28/2022   HDL 36.70 (L) 07/16/2021   HDL 41.60 10/16/2020   Lab Results  Component Value Date   LDLCALC 46 07/28/2022   LDLCALC 64 07/16/2021   LDLCALC 66 10/16/2020   Lab Results  Component Value Date   TRIG 102.0 07/28/2022   TRIG 108.0 07/16/2021   TRIG 75.0 10/16/2020   Lab Results  Component Value Date   CHOLHDL 3 07/28/2022   CHOLHDL 3 07/16/2021   CHOLHDL 3 10/16/2020   Lab Results  Component Value Date   LDLDIRECT 165.3 11/13/2010   LDLDIRECT 143.5 08/30/2008   The ASCVD Risk score (Arnett DK, et al., 2019) failed to calculate for the following reasons:   The valid total cholesterol range is 130 to 320 mg/dL I have reviewed the PMH, Fam and Soc history. Patient Active Problem List   Diagnosis Date Noted Date Diagnosed   Depression, major, single episode, moderate (HCC) 07/11/2020     Priority: High   DM type 2 with diabetic peripheral neuropathy (HCC) 09/02/2016     Priority: High    Diagnosed 2019; started meds 2020; + microalbunuria on ace    Recurrent kidney stones 08/29/2015     Priority: High    Overview:  S/p stents, multiple, [facility], Dr. Lenore Cordia  GAD (generalized anxiety disorder) 08/29/2015     Priority: High   Acquired hypothyroidism 11/02/2013     Priority: High   Mixed hyperlipidemia 04/04/2012     Priority: High   Fibromyalgia 10/11/2007     Priority: High   Chronic low back pain 10/11/2007     Priority: High   Chronic narcotic use 07/28/2022     Priority: Medium    Osteopenia after menopause 06/06/2019     Priority: Medium     dexa 2019 T = -1.9, multiple site osteopenia;  DEXA December 2022, osteopenia    Bilateral hydronephrosis      Priority: Medium    Degenerative scoliosis 09/07/2018     Priority: Medium    Psoriasis 12/02/2017     Priority: Medium    History of Barrett's esophagus 08/29/2015     Priority: Medium     Last EGD 2018, no evidence of Barrett's  esophagus, no further EGD recommended    GERD (gastroesophageal reflux disease) 08/29/2015     Priority: Medium    Irritable bowel syndrome with constipation 08/29/2015     Priority: Medium    Lumbosacral spondylosis with radiculopathy 08/29/2015     Priority: Medium    Barrett's esophagus with esophagitis 08/29/2015     Priority: Medium     Formatting of this note might be different from the original. Last EGD 2018, no evidence of Barrett's esophagus, no further EGD recommended    Primary insomnia 10/25/2014     Priority: Medium     Overview:  Last Assessment & Plan:  Insomnia is stable with current regimen of Ambien. Continue current dosage of Ambien.    Seborrheic dermatitis of scalp 12/02/2017     Priority: Low   Allergic rhinitis 10/11/2007     Priority: Low    Qualifier: Diagnosis of  By: Briscoe Burns CMA, Alvy Beal      Stage 3a chronic kidney disease (HCC) 05/13/2023    Tubular adenoma of colon 01/26/2023     Dr. Leone Payor, colonoscopy 2021. Repeat in 3 years.    Lower extremity edema 04/15/2021     Social History: Patient  reports that she has never smoked. She has never used smokeless tobacco. She reports current alcohol use of about 1.0 standard drink of alcohol per week. She reports that she does not use drugs.  Review of Systems: Ophthalmic: negative for eye pain, loss of vision or double vision Cardiovascular: negative for chest pain Respiratory: negative for SOB or persistent cough Gastrointestinal: negative for abdominal pain Genitourinary: negative for dysuria or gross hematuria MSK: negative for foot lesions Neurologic: negative for weakness or gait disturbance  Objective  Vitals: BP 110/70 Comment: by home readings  Pulse 83   Temp 97.8 F (36.6 C)   Ht 5\' 3"  (1.6 m)   Wt 221 lb (100.2 kg)   SpO2 95%   BMI 39.15 kg/m  General: well appearing, no acute distress  Psych:  Alert and oriented, normal mood and affect HEENT:  Normocephalic, atraumatic,  moist mucous membranes, supple neck  Cardiovascular:  Nl S1 and S2, RRR without murmur, gallop or rub. no edema Respiratory:  Good breath sounds bilaterally, CTAB with normal effort, no rales Neurologic:   Mental status is normal. normal gait Foot exam: no erythema, pallor, or cyanosis visible nl proprioception and sensation to monofilament testing bilaterally, +2 distal pulses bilaterally    Diabetic education: ongoing education regarding chronic disease management for diabetes was given today. We continue to reinforce the ABC's of  diabetic management: A1c (<7 or 8 dependent upon patient), tight blood pressure control, and cholesterol management with goal LDL < 100 minimally. We discuss diet strategies, exercise recommendations, medication options and possible side effects. At each visit, we review recommended immunizations and preventive care recommendations for diabetics and stress that good diabetic control can prevent other problems. See below for this patient's data.   Commons side effects, risks, benefits, and alternatives for medications and treatment plan prescribed today were discussed, and the patient expressed understanding of the given instructions. Patient is instructed to call or message via MyChart if he/she has any questions or concerns regarding our treatment plan. No barriers to understanding were identified. We discussed Red Flag symptoms and signs in detail. Patient expressed understanding regarding what to do in case of urgent or emergency type symptoms.  Medication list was reconciled, printed and provided to the patient in AVS. Patient instructions and summary information was reviewed with the patient as documented in the AVS. This note was prepared with assistance of Dragon voice recognition software. Occasional wrong-word or sound-a-like substitutions may have occurred due to the inherent limitations of voice recognition software

## 2023-05-19 ENCOUNTER — Encounter: Payer: Self-pay | Admitting: Internal Medicine

## 2023-07-05 ENCOUNTER — Ambulatory Visit (AMBULATORY_SURGERY_CENTER): Payer: Medicare HMO

## 2023-07-05 VITALS — Ht 63.0 in | Wt 215.0 lb

## 2023-07-05 DIAGNOSIS — Z860100 Personal history of colon polyps, unspecified: Secondary | ICD-10-CM

## 2023-07-05 NOTE — Progress Notes (Signed)
 No egg or soy allergy known to patient  No issues known to pt with past sedation with any surgeries or procedures Patient denies ever being told they had issues or difficulty with intubation  No FH of Malignant Hyperthermia Pt is not on diet pills Pt is not on  home 02  Pt is not on blood thinners  Pt denies has with constipation and takes Colace No A fib or A flutter Have any cardiac testing pending--no Pt can ambulate independently Pt denies use of chewing tobacco Discussed diabetic I weight loss medication holds Discussed NSAID holds Checked BMI Pt instructed to use [employer] or GoodRx for a price reduction on prep  Patient's chart reviewed by Norleen Schillings CNRA prior to previsit and patient appropriate for the [facility].  Pre visit completed and red dot placed by patient's name on their procedure day (on provider's schedule).

## 2023-07-10 ENCOUNTER — Other Ambulatory Visit: Payer: Self-pay | Admitting: Family Medicine

## 2023-07-15 ENCOUNTER — Encounter: Payer: Self-pay | Admitting: Internal Medicine

## 2023-07-19 ENCOUNTER — Telehealth: Payer: Self-pay | Admitting: Family Medicine

## 2023-07-19 ENCOUNTER — Telehealth: Payer: Self-pay | Admitting: Internal Medicine

## 2023-07-19 ENCOUNTER — Ambulatory Visit: Payer: Self-pay | Admitting: Family Medicine

## 2023-07-19 ENCOUNTER — Ambulatory Visit (INDEPENDENT_AMBULATORY_CARE_PROVIDER_SITE_OTHER): Payer: Medicare HMO | Admitting: Family

## 2023-07-19 ENCOUNTER — Encounter: Payer: Self-pay | Admitting: Family

## 2023-07-19 VITALS — BP 139/79 | HR 64 | Temp 98.0°F | Ht 63.0 in | Wt 214.0 lb

## 2023-07-19 DIAGNOSIS — R42 Dizziness and giddiness: Secondary | ICD-10-CM | POA: Diagnosis not present

## 2023-07-19 DIAGNOSIS — R110 Nausea: Secondary | ICD-10-CM

## 2023-07-19 DIAGNOSIS — G44201 Tension-type headache, unspecified, intractable: Secondary | ICD-10-CM | POA: Diagnosis not present

## 2023-07-19 MED ORDER — ONDANSETRON 4 MG PO TBDP: 4.0000 mg | ORAL_TABLET | Freq: Three times a day (TID) | ORAL | 0 refills | Status: AC | PRN

## 2023-07-19 MED ORDER — MECLIZINE HCL 12.5 MG PO TABS: 12.5000 mg | ORAL_TABLET | Freq: Three times a day (TID) | ORAL | 0 refills | Status: AC | PRN

## 2023-07-19 MED ORDER — KETOROLAC TROMETHAMINE 60 MG/2ML IM SOLN
60.0000 mg | Freq: Once | INTRAMUSCULAR | Status: AC
  Administered 2023-07-19: 60 mg via INTRAMUSCULAR

## 2023-07-19 NOTE — Patient Instructions (Signed)
It was very nice to see you today!   We gave you a Toradol injection to help get rid of your headache. You can take Tylenol tonight when you get home if needed. I also sent over generic Zofran to put under your tongue, start this when you get home. Continue to hydrate well, start foods slowly when no more nausea, avoid dairy, spicy and fried foods for a few days. I also sent over Meclizine which helps the dizziness - may cause drowsiness - take this as well this evening as can help your dizzy symptoms and you can rest.      PLEASE NOTE:  If you had any lab tests please let us know if you have not heard back within a few days. You may see your results on MyChart before we have a chance to review them but we will give you a call once they are reviewed by [name]. If we ordered any referrals today, please let us know if you have not heard from their office within the next week.

## 2023-07-19 NOTE — Addendum Note (Signed)
Addended byDulce Sellar on: 07/19/2023 05:05 PM   Modules accepted: Level of Service

## 2023-07-19 NOTE — Progress Notes (Signed)
Patient ID: Miranda Mathis, female    DOB: 1951/01/01, 73 y.o.   MRN: [id number]  Chief Complaint  Patient presents with   Dizziness    Pt c/o dizzines, nausea, vomiting and headaches. SX present for 3 days, Has tried clear liquids and she was unable to keep down and zofran which did help sx.        Discussed the use of AI scribe software for clinical note transcription with the patient, who gave verbal consent to proceed.  History of Present Illness   The patient, with a history of diabetes and hypertension, presents with a three-day history of persistent dizziness, even while sitting, which is followed by nausea. She reports that the symptoms are severe enough to cause vomiting after eating, leading her to switch to a liquid diet. The patient also reports a persistent headache that started before the dizziness and nausea. The headache is located at the front and back of the head and is associated with light sensitivity. The patient has tried Tylenol for the headache, but it has not provided relief. The patient denies changes in bowel movements. The patient has been on Glucophage for diabetes and Marcelline Deist but she has been off of [place] for a couple of weeks due to a misunderstanding about a prescription refill. The patient has been able to keep down her thyroid medication but has not been taking her other regular medications, including meloxicam for pain, due to the nausea and vomiting.     Assessment & Plan:     Headache with associated dizziness and nausea - Persistent for three days, not associated with any known triggers. No history of migraines. Pain is not severe but persistent and associated with photophobia. -Administer Toradol 60mg  injection today for pain relief. -Sending Meclizine 12.5mg  tid prn for dizziness, advised may cause drowsiness, take today and in the morning and then prn. -Prescribed Zofran 4mg  ODT q8h prn for nausea, advised to dissolve under the tongue. -Advised to stick  with fluids and gradually reintroduce solid foods, avoiding dairy, spicy, and fried foods. -Advised to monitor symptoms and report if any or all persists.     Subjective:    Outpatient Medications Prior to Visit  Medication Sig Dispense Refill   Cholecalciferol (VITAMIN D) 50 MCG (2000 UT) tablet Take 2,000 Units by mouth daily.      clobetasol (TEMOVATE) 0.05 % external solution Apply 1 Application topically 2 (two) times daily. Scalp 50 mL 6   clotrimazole-betamethasone (LOTRISONE) cream APPLY 1 APPLICATION TOPICALLY DAILY 45 g 0   dapagliflozin propanediol (FARXIGA) 10 MG TABS tablet Take 1 tablet (10 mg total) by mouth daily. 90 tablet 3   docusate sodium (COLACE) 50 MG capsule Take 50 mg by mouth 2 (two) times daily.     furosemide (LASIX) 20 MG tablet Take 2 tablets (40 mg total) by mouth daily as needed for edema. Take with potassium 90 tablet 3   gabapentin (NEURONTIN) 300 MG capsule TAKE 2 CAPSULES BY MOUTH 3 TIMES DAILY. 540 capsule 3   levothyroxine (SYNTHROID) 200 MCG tablet TAKE 1 TABLET BY MOUTH EVERY DAY 90 tablet 3   levothyroxine (SYNTHROID) 25 MCG tablet Take 1 tablet (25 mcg total) by mouth daily. 90 tablet 3   meloxicam (MOBIC) 7.5 MG tablet TAKE 1 TABLET BY MOUTH EVERY DAY AS NEEDED FOR PAIN 30 tablet 3   metFORMIN (GLUCOPHAGE) 1000 MG tablet TAKE 1 TABLET (1,000 MG TOTAL) BY MOUTH TWICE A DAY WITH FOOD 180 tablet 3  omeprazole (PRILOSEC) 40 MG capsule TAKE 1 CAPSULE BY MOUTH EVERY DAY 90 capsule 1   potassium chloride SA (KLOR-CON M) 20 MEQ tablet Take 1 tablet (20 mEq total) by mouth daily as needed (leg swelling). Take with furosemide 90 tablet 3   rosuvastatin (CRESTOR) 40 MG tablet TAKE 1 TABLET BY MOUTH EVERY DAY 90 tablet 3   sertraline (ZOLOFT) 100 MG tablet TAKE 2 TABLETS BY MOUTH EVERY DAY 180 tablet 2   zolpidem (AMBIEN) 10 MG tablet TAKE 1 TABLET BY MOUTH EVERY DAY AT BEDTIME AS NEEDED FOR SLEEP 30 tablet 5   No facility-administered medications prior to  visit.   Past Medical History:  Diagnosis Date   Allergic rhinitis    Allergy    seasonal allergies   Barrett's esophagus    Bladder spasms    Cataract    Chronic back pain    DDD (degenerative disc disease), lumbar    on meds   Degenerative scoliosis    Depression    on meds   Diabetes mellitus without complication (HCC)    on meds   Diabetic peripheral neuropathy (HCC)    Fibromyalgia    Full dentures    GAD (generalized anxiety disorder)    on meds   GERD (gastroesophageal reflux disease)    on meds   History of bladder stone    History of colon polyps    History of kidney stones    History of migraine    History of recurrent UTIs    History of sepsis    10-18-2018  bacterium-ecoli blood culture   Hydronephrosis    Hydronephrosis, bilateral    due to DM   Hyperlipidemia    on meds   Hypertension    on meds   Hypothyroidism 11/02/2013 dx   followed by pcp- on meds   IBS (irritable bowel syndrome)    Insomnia    Osteopenia    Osteopenia after menopause 06/06/2019   dexa 2019 T = -1.9, multiple site osteopenia; recheck 2021   Peripheral neuropathy    Psoriasis    Restless leg syndrome    Seborrheic dermatitis of scalp    Spondylolysis of lumbar region    Wears glasses    Past Surgical History:  Procedure Laterality Date   ABDOMINAL HYSTERECTOMY  1987   BALLOON DILATION  07/10/2011   Procedure: BALLOON DILATION;  Surgeon: Antony Haste, MD;  Location: [facility];  Service: Urology;  Laterality: Right;   CESAREAN SECTION  X2   CHOLECYSTECTOMY     COLONOSCOPY  2018   at [facility] (results not availabe at this time)   CYSTO/ BILATERAL URETEROSCOPY / URETERAL BX'S/ BILATERAL URETERAL STENT PLACEMENT/ BLADDER STONE EXTRACTION  03-13-2011   CYSTOSCOPY W/ RETROGRADES  07/10/2011   Procedure: CYSTOSCOPY WITH RETROGRADE PYELOGRAM;  Surgeon: Antony Haste, MD;  Location[facility];  Service: Urology;  Laterality:  Right;   CYSTOSCOPY W/ URETERAL STENT PLACEMENT  07/10/2011   Procedure: CYSTOSCOPY WITH STENT REPLACEMENT;  Surgeon: Antony Haste, MD;  Location: [facility];  Service: Urology;  Laterality: Right;   CYSTOSCOPY W/ URETERAL STENT PLACEMENT Bilateral 10/22/2018   Procedure: CYSTOSCOPY WITH RETROGRADE PYELOGRAM/URETERAL STENT PLACEMENT-BILATERAL;  Surgeon: Crist Fat, MD;  Location: [facility] OR;  Service: Urology;  Laterality: Bilateral;   CYSTOSCOPY W/ URETERAL STENT REMOVAL  07/10/2011   Procedure: CYSTOSCOPY WITH STENT REMOVAL;  Surgeon: Antony Haste, MD;  Location: [facility];  Service:  Urology;  Laterality: Right;   CYSTOSCOPY WITH URETEROSCOPY AND STENT PLACEMENT Bilateral 11/29/2018   Procedure: CYSTOSCOPY WITH BILATERAL URETEROSCOPY AND STENT EXCHANGE/ WITH LEFT BRUSH BIOPSY;  Surgeon: Jerilee Field, MD;  Location: [facility];  Service: Urology;  Laterality: Bilateral;   CYSTOSCOPY/RETROGRADE/URETEROSCOPY Left 11/14/2019   Procedure: CYSTOSCOPY/RETROGRADE/URETEROSCOPY/ BIOPSY/ STENT PLACEMENT;  Surgeon: Jerilee Field, MD;  Location: [facility];  Service: Urology;  Laterality: Left;   LUMBAR MICRODISCECTOMY  1990'S   L5 - S1   RIGHT FOOT SURG  2003   HEEL   RIGHT URETEROSCOPIC / URETERAL BX/ STENT PLACEMENT  04-17-2011   UPPER GASTROINTESTINAL ENDOSCOPY  2018   @ [facility] -results not available at this time   UPPER GI ENDOSCOPY     Allergies  Allergen Reactions   Ketorolac     Other reaction(s): Other (See Comments) Not supposed to take      Objective:    Physical Exam Vitals and nursing note reviewed.  Constitutional:      Appearance: Normal appearance. She is ill-appearing.  Cardiovascular:     Rate and Rhythm: Normal rate and regular rhythm.  Pulmonary:     Effort: Pulmonary effort is normal.     Breath sounds: Normal breath sounds.  Musculoskeletal:        General: Normal range of motion.  Skin:     General: Skin is warm and dry.  Neurological:     Mental Status: She is alert.  Psychiatric:        Mood and Affect: Mood normal.        Behavior: Behavior normal.    BP 139/79 (BP Location: Left Arm, Patient Position: Sitting, Cuff Size: Large)   Pulse 64   Temp 98 F (36.7 C) (Temporal)   Ht 5\' 3"  (1.6 m)   Wt 214 lb (97.1 kg)   SpO2 98%   BMI 37.91 kg/m  Wt Readings from Last 3 Encounters:  07/19/23 214 lb (97.1 kg)  07/05/23 215 lb (97.5 kg)  05/13/23 221 lb (100.2 kg)      Dulce Sellar, NP

## 2023-07-19 NOTE — Addendum Note (Signed)
Addended by: Stan Head E on: 07/19/2023 12:12 PM   Modules accepted: Orders

## 2023-07-19 NOTE — Telephone Encounter (Signed)
Since she is sick do not charge her for a no-show

## 2023-07-19 NOTE — Telephone Encounter (Addendum)
  Chief Complaint: Dizziness Symptoms: vertigo, headache, n/v Frequency: Constant Pertinent Negatives: Patient denies numbness/weakness, slurred speech, chest pain, head injury, shoulder pain, SOB, heart palpitation, bradycardia Disposition: [] ED /[] Urgent Care (no appt availability in office) / [x] Appointment(In office/virtual)/ []  [facility]/ [] Home Care/ [] Refused Recommended Disposition /[] [facility]/ []  Follow-up with PCP Additional Notes: Patient called for complaints of dizziness off and on for a while, but worsening in the last three days. Patient's describes dizziness as "the room is spinning" and states that she needs assistance when walking due to the sensation of falling. Patient states that dizziness is associated with lightheadedness and is aggravated with standing or positional changes. Patient states she has had a nagging headache (5/10) that was present before dizziness started and is only minimally relieved with tylenol. Patient states she is unable to keep fluids or foods down with n/v, and wasa only able to successfully eat a small jello bowl earlier today. Patient admits to being prediabetic and managing symptoms with diet and Glucophage, but does not currently monitor/take her BS. Patient denied heart racing, slow HR, or heart palpitations. Patient also had complains of a sore shoulder that has been off and on painful since it was broken last years. Patient advised by this RN to be seen within next 4 hours per protocol, to which patient was agreeable. Patient advised by this RN to call back with worsening symptoms. Patient verbalized understanding.    Reason for Disposition  [1] Dizziness caused by heat exposure, sudden standing, or poor fluid intake AND [2] no improvement after 2 hours of rest and fluids  Answer Assessment - Initial Assessment Questions 1. DESCRIPTION: "Describe your dizziness."     "The room is spinning." 2. LIGHTHEADED: "Do you feel  lightheaded?" (e.g., somewhat faint, woozy, weak upon standing)     Confirms 3. VERTIGO: "Do you feel like either you or the room is spinning or tilting?" (i.e. vertigo)     Confirms 4. SEVERITY: "How bad is it?"  "Do you feel like you are going to faint?" "Can you stand and walk?"   - MILD: Feels slightly dizzy, but walking normally.   - MODERATE: Feels unsteady when walking, but not falling; interferes with normal activities (e.g., school, work).   - SEVERE: Unable to walk without falling, or requires assistance to walk without falling; feels like passing out now.      Moderate 5. ONSET:  "When did the dizziness begin?"     3 days 6. AGGRAVATING FACTORS: "Does anything make it worse?" (e.g., standing, change in head position)     Changing positions, opening eyes after being closed, standing up. 7. HEART RATE: "Can you tell me your heart rate?" "How many beats in 15 seconds?"  (Note: not all patients can do this)       Denies 8. CAUSE: "What do you think is causing the dizziness?"     Unsure "I have been drinking a little lesss cause I've nauseated. I couldn't do the prep for my colonoscopy because of this." 9. RECURRENT SYMPTOM: "Have you had dizziness before?" If Yes, ask: "When was the last time?" "What happened that time?"     "Been dizzy off and on for a while 10. OTHER SYMPTOMS: "Do you have any other symptoms?" (e.g., fever, chest pain, vomiting, diarrhea, bleeding)       N/v, headache (taking tylenol rated 5/10, present before dizziness)  Protocols used: Dizziness - Lightheadedness-A-AH

## 2023-07-19 NOTE — Telephone Encounter (Unsigned)
Copied from CRM [id number]. Topic: Clinical - Red Word Triage >> Jul 19, 2023 12:24 PM Clayton Bibles wrote: Red Word that prompted transfer to Nurse Triage: Left shoulder pain, throwing up for several days, severe dizzy for 3-4 days all the time. No fever, No chest pains,

## 2023-07-19 NOTE — Telephone Encounter (Signed)
Patient called and reschedule her appointment for her procedure on the 28th of January due to her feeling under the weather. Patient has reschedule procedure for Feb 12 at [facility].

## 2023-07-20 ENCOUNTER — Encounter: Payer: Medicare HMO | Admitting: Internal Medicine

## 2023-07-22 DIAGNOSIS — Z8262 Family history of osteoporosis: Secondary | ICD-10-CM | POA: Diagnosis not present

## 2023-07-22 DIAGNOSIS — Z1231 Encounter for screening mammogram for malignant neoplasm of breast: Secondary | ICD-10-CM | POA: Diagnosis not present

## 2023-07-22 DIAGNOSIS — M8588 Other specified disorders of bone density and structure, other site: Secondary | ICD-10-CM | POA: Diagnosis not present

## 2023-07-22 LAB — HM DEXA SCAN

## 2023-07-22 LAB — HM MAMMOGRAPHY

## 2023-07-23 ENCOUNTER — Other Ambulatory Visit: Payer: Self-pay | Admitting: Family Medicine

## 2023-07-23 ENCOUNTER — Encounter: Payer: Self-pay | Admitting: Family Medicine

## 2023-07-26 ENCOUNTER — Ambulatory Visit (AMBULATORY_SURGERY_CENTER): Payer: Medicare HMO

## 2023-07-26 VITALS — Ht 63.0 in | Wt 214.0 lb

## 2023-07-26 DIAGNOSIS — Z860100 Personal history of colon polyps, unspecified: Secondary | ICD-10-CM

## 2023-07-26 MED ORDER — PEG 3350-KCL-NA BICARB-NACL 420 G PO SOLR: 4000.0000 mL | Freq: Once | ORAL | 0 refills | Status: AC

## 2023-07-26 NOTE — Progress Notes (Signed)

## 2023-07-28 ENCOUNTER — Telehealth: Payer: Self-pay | Admitting: Family Medicine

## 2023-07-28 NOTE — Telephone Encounter (Signed)
 Routing to clinical staff for review and provider recommendations.  Copied from CRM [id number]. Topic: Clinical - Prescription Issue >> Jul 28, 2023  1:07 PM Kinnie H wrote: Reason for CRM: Patient is stating that the medication farixga is too expensive for her at this time and is inn hopes of something cheaper

## 2023-07-28 NOTE — Telephone Encounter (Signed)
 Please see pt concern and advise if any alternative recommendations for [facility]

## 2023-07-29 MED ORDER — GLIPIZIDE ER 5 MG PO TB24: 5.0000 mg | ORAL_TABLET | Freq: Every day | ORAL | 3 refills | Status: DC

## 2023-07-29 NOTE — Addendum Note (Signed)
 Addended by: Karma Oz on: 07/29/2023 03:50 PM   Modules accepted: Orders

## 2023-07-29 NOTE — Telephone Encounter (Signed)
 Stop the farxiga ; changed to glucotrol  xl due to cost.  (Jardiance was expensive as well on her plan)  Please notify pt.

## 2023-08-03 NOTE — Progress Notes (Unsigned)
[facility] History and Physical   Primary Care Physician:  Willow Ora, MD   Reason for Procedure:  History of adenomatous colon polyps  Plan:    Colonoscopy     HPI: Miranda Mathis is a 73 y.o. female with a history of removal of 6 small adenomas in 2021 presenting for a surveillance colonoscopy.  There is a history of 3 small adenomas removed in 2018.  Lab Results  Component Value Date   IRON 56 01/26/2023   TIBC 397 01/26/2023   FERRITIN 28 01/26/2023   Lab Results  Component Value Date   WBC 5.3 01/26/2023   HGB 13.0 01/26/2023   HCT 41.4 01/26/2023   MCV 85.4 01/26/2023   PLT 172.0 01/26/2023    Past Medical History:  Diagnosis Date   Allergic rhinitis    Allergy    seasonal allergies   Barrett's esophagus    Bladder spasms    Cataract    Chronic back pain    DDD (degenerative disc disease), lumbar    on meds   Degenerative scoliosis    Depression    on meds   Diabetes mellitus without complication (HCC)    on meds   Diabetic peripheral neuropathy (HCC)    Fibromyalgia    Full dentures    GAD (generalized anxiety disorder)    on meds   GERD (gastroesophageal reflux disease)    on meds   History of bladder stone    History of colon polyps    History of kidney stones    History of migraine    History of recurrent UTIs    History of sepsis    10-18-2018  bacterium-ecoli blood culture   Hydronephrosis    Hydronephrosis, bilateral    due to DM   Hyperlipidemia    on meds   Hypertension    on meds   Hypothyroidism 11/02/2013 dx   followed by pcp- on meds   IBS (irritable bowel syndrome)    Insomnia    Osteopenia    Osteopenia after menopause 06/06/2019   dexa 2019 T = -1.9, multiple site osteopenia; recheck 2021   Peripheral neuropathy    Psoriasis    Restless leg syndrome    Seborrheic dermatitis of scalp    Spondylolysis of lumbar region    Wears glasses     Past Surgical History:  Procedure Laterality Date    ABDOMINAL HYSTERECTOMY  1987   BALLOON DILATION  07/10/2011   Procedure: BALLOON DILATION;  Surgeon: Antony Haste, MD;  Location: [facility];  Service: Urology;  Laterality: Right;   CESAREAN SECTION  X2   CHOLECYSTECTOMY     COLONOSCOPY  2018   at [facility] (results not availabe at this time)   CYSTO/ BILATERAL URETEROSCOPY / URETERAL BX'S/ BILATERAL URETERAL STENT PLACEMENT/ BLADDER STONE EXTRACTION  03-13-2011   CYSTOSCOPY W/ RETROGRADES  07/10/2011   Procedure: CYSTOSCOPY WITH RETROGRADE PYELOGRAM;  Surgeon: Antony Haste, MD;  Location: [facility];  Service: Urology;  Laterality: Right;   CYSTOSCOPY W/ URETERAL STENT PLACEMENT  07/10/2011   Procedure: CYSTOSCOPY WITH STENT REPLACEMENT;  Surgeon: Antony Haste, MD;  Location: [facility];  Service: Urology;  Laterality: Right;   CYSTOSCOPY W/ URETERAL STENT PLACEMENT Bilateral 10/22/2018   Procedure: CYSTOSCOPY WITH RETROGRADE PYELOGRAM/URETERAL STENT PLACEMENT-BILATERAL;  Surgeon: Crist Fat, MD;  Location: [facility] OR;  Service: Urology;  Laterality: Bilateral;   CYSTOSCOPY W/ URETERAL STENT REMOVAL  07/10/2011  Procedure: CYSTOSCOPY WITH STENT REMOVAL;  Surgeon: Antony Haste, MD;  Location: [facility];  Service: Urology;  Laterality: Right;   CYSTOSCOPY WITH URETEROSCOPY AND STENT PLACEMENT Bilateral 11/29/2018   Procedure: CYSTOSCOPY WITH BILATERAL URETEROSCOPY AND STENT EXCHANGE/ WITH LEFT BRUSH BIOPSY;  Surgeon: Jerilee Field, MD;  Location: [facility];  Service: Urology;  Laterality: Bilateral;   CYSTOSCOPY/RETROGRADE/URETEROSCOPY Left 11/14/2019   Procedure: CYSTOSCOPY/RETROGRADE/URETEROSCOPY/ BIOPSY/ STENT PLACEMENT;  Surgeon: Jerilee Field, MD;  Location: [facility];  Service: Urology;  Laterality: Left;   LUMBAR MICRODISCECTOMY  1990'S   L5 - S1   RIGHT FOOT SURG  2003   HEEL   RIGHT URETEROSCOPIC / URETERAL BX/  STENT PLACEMENT  04-17-2011   UPPER GASTROINTESTINAL ENDOSCOPY  2018   @ [facility] -results not available at this time   UPPER GI ENDOSCOPY      Prior to Admission medications   Medication Sig Start Date End Date Taking? Authorizing Provider  Cholecalciferol (VITAMIN D) 50 MCG (2000 UT) tablet Take 2,000 Units by mouth daily.     [provider]  clobetasol (TEMOVATE) 0.05 % external solution Apply 1 Application topically 2 (two) times daily. Scalp 10/26/22   Willow Ora, MD  clotrimazole-betamethasone (LOTRISONE) cream APPLY 1 APPLICATION TOPICALLY DAILY 11/12/22   Willow Ora, MD  docusate sodium (COLACE) 50 MG capsule Take 50 mg by mouth 2 (two) times daily.    [provider]  furosemide (LASIX) 20 MG tablet TAKE 1 TABLET (20 MG TOTAL) BY MOUTH DAILY AS NEEDED FOR EDEMA. TAKE WITH POTASSIUM 07/23/23   Willow Ora, MD  gabapentin (NEURONTIN) 300 MG capsule TAKE 2 CAPSULES BY MOUTH 3 TIMES DAILY. 11/12/22   Willow Ora, MD  glipiZIDE (GLUCOTROL XL) 5 MG 24 hr tablet Take 1 tablet (5 mg total) by mouth daily with breakfast. 07/29/23   Willow Ora, MD  levothyroxine (SYNTHROID) 200 MCG tablet TAKE 1 TABLET BY MOUTH EVERY DAY 04/12/23   Willow Ora, MD  levothyroxine (SYNTHROID) 25 MCG tablet Take 1 tablet (25 mcg total) by mouth daily. 02/16/23   Willow Ora, MD  meclizine (ANTIVERT) 12.5 MG tablet Take 1 tablet (12.5 mg total) by mouth 3 (three) times daily as needed for dizziness (May cause drowsiness.). Patient not taking: Reported on 07/26/2023 07/19/23   Dulce Sellar, NP  meloxicam (MOBIC) 7.5 MG tablet TAKE 1 TABLET BY MOUTH EVERY DAY AS NEEDED FOR PAIN 02/04/23   Willow Ora, MD  metFORMIN (GLUCOPHAGE) 1000 MG tablet TAKE 1 TABLET (1,000 MG TOTAL) BY MOUTH TWICE A DAY WITH FOOD 07/12/23   Willow Ora, MD  omeprazole (PRILOSEC) 40 MG capsule TAKE 1 CAPSULE BY MOUTH EVERY DAY 11/12/22   Willow Ora, MD  ondansetron (ZOFRAN-ODT) 4 MG  disintegrating tablet Take 1 tablet (4 mg total) by mouth every 8 (eight) hours as needed for nausea or vomiting. 07/19/23   Dulce Sellar, NP  potassium chloride SA (KLOR-CON M) 20 MEQ tablet Take 1 tablet (20 mEq total) by mouth daily as needed (leg swelling). Take with furosemide 10/26/22   Willow Ora, MD  rosuvastatin (CRESTOR) 40 MG tablet TAKE 1 TABLET BY MOUTH EVERY DAY 07/12/23   Willow Ora, MD  sertraline (ZOLOFT) 100 MG tablet TAKE 2 TABLETS BY MOUTH EVERY DAY 11/12/22   Willow Ora, MD  zolpidem (AMBIEN) 10 MG tablet TAKE 1 TABLET BY MOUTH EVERY DAY AT BEDTIME AS NEEDED FOR SLEEP 02/16/23  Willow Ora, MD  pravastatin (PRAVACHOL) 40 MG tablet Take 1 tablet (40 mg total) by mouth daily. 01/26/19 06/06/19  Willow Ora, MD    Current Outpatient Medications  Medication Sig Dispense Refill   Cholecalciferol (VITAMIN D) 50 MCG (2000 UT) tablet Take 2,000 Units by mouth daily.      clobetasol (TEMOVATE) 0.05 % external solution Apply 1 Application topically 2 (two) times daily. Scalp 50 mL 6   clotrimazole-betamethasone (LOTRISONE) cream APPLY 1 APPLICATION TOPICALLY DAILY 45 g 0   gabapentin (NEURONTIN) 300 MG capsule TAKE 2 CAPSULES BY MOUTH 3 TIMES DAILY. 540 capsule 3   glipiZIDE (GLUCOTROL XL) 5 MG 24 hr tablet Take 1 tablet (5 mg total) by mouth daily with breakfast. 90 tablet 3   levothyroxine (SYNTHROID) 200 MCG tablet TAKE 1 TABLET BY MOUTH EVERY DAY 90 tablet 3   levothyroxine (SYNTHROID) 25 MCG tablet Take 1 tablet (25 mcg total) by mouth daily. 90 tablet 3   metFORMIN (GLUCOPHAGE) 1000 MG tablet TAKE 1 TABLET (1,000 MG TOTAL) BY MOUTH TWICE A DAY WITH FOOD 180 tablet 3   omeprazole (PRILOSEC) 40 MG capsule TAKE 1 CAPSULE BY MOUTH EVERY DAY 90 capsule 1   ondansetron (ZOFRAN-ODT) 4 MG disintegrating tablet Take 1 tablet (4 mg total) by mouth every 8 (eight) hours as needed for nausea or vomiting. 20 tablet 0   potassium chloride SA (KLOR-CON M) 20 MEQ tablet  Take 1 tablet (20 mEq total) by mouth daily as needed (leg swelling). Take with furosemide 90 tablet 3   rosuvastatin (CRESTOR) 40 MG tablet TAKE 1 TABLET BY MOUTH EVERY DAY 90 tablet 3   sertraline (ZOLOFT) 100 MG tablet TAKE 2 TABLETS BY MOUTH EVERY DAY 180 tablet 2   zolpidem (AMBIEN) 10 MG tablet TAKE 1 TABLET BY MOUTH EVERY DAY AT BEDTIME AS NEEDED FOR SLEEP 30 tablet 5   docusate sodium (COLACE) 50 MG capsule Take 50 mg by mouth 2 (two) times daily.     furosemide (LASIX) 20 MG tablet TAKE 1 TABLET (20 MG TOTAL) BY MOUTH DAILY AS NEEDED FOR EDEMA. TAKE WITH POTASSIUM 90 tablet 3   meclizine (ANTIVERT) 12.5 MG tablet Take 1 tablet (12.5 mg total) by mouth 3 (three) times daily as needed for dizziness (May cause drowsiness.). (Patient not taking: Reported on 07/26/2023) 30 tablet 0   meloxicam (MOBIC) 7.5 MG tablet TAKE 1 TABLET BY MOUTH EVERY DAY AS NEEDED FOR PAIN 30 tablet 3   Current Facility-Administered Medications  Medication Dose Route Frequency Provider Last Rate Last Admin   0.9 %  sodium chloride infusion  500 mL Intravenous Once Iva Boop, MD        Allergies as of 08/04/2023 - Review Complete 08/04/2023  Allergen Reaction Noted   Ketorolac Other (See Comments) 05/29/2020    Family History  Problem Relation Age of Onset   COPD Mother    Emphysema Mother    Osteoarthritis Mother    Rheum arthritis Mother    Arthritis Mother    Hypertension Mother    Prostate cancer Father        w/mets   Arthritis Father    Birth defects Father    Cancer Father    Hypertension Father    Arthritis Daughter    Asthma Daughter    Birth defects Daughter    Depression Daughter    Hearing loss Daughter    Mental retardation Daughter    Kidney Stones Daughter    Arthritis  Brother    Hyperlipidemia Brother    Coronary artery disease Paternal Grandfather    Diabetes Paternal Aunt        x 3   Arthritis Maternal Grandmother    Colon cancer Neg Hx    Colon polyps Neg Hx     Esophageal cancer Neg Hx    Stomach cancer Neg Hx    Rectal cancer Neg Hx     Social History   Socioeconomic History   Marital status: Married    Spouse name: Not on file   Number of children: 2   Years of education: 13   Highest education level: Not on file  Occupational History   Occupation: LPN    Employer: [employer]  Tobacco Use   Smoking status: Never   Smokeless tobacco: Never  Vaping Use   Vaping status: Never Used  Substance and Sexual Activity   Alcohol use: Yes    Alcohol/week: 1.0 standard drink of alcohol    Types: 1 Standard drinks or equivalent per week    Comment: rarely   Drug use: No   Sexual activity: Yes    Partners: Male  Other Topics Concern   Not on file  Social History Narrative   HSG, 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 1 daughter - '83 trisomy 13 defect; 1 son - '87, offers a road-side service; 1 grandson; 1 step -son.  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   Social Drivers of Corporate investment banker Strain: Low Risk  (08/05/2020)   Overall Financial Resource Strain (CARDIA)    Difficulty of Paying Living Expenses: Not hard at all  Food Insecurity: No Food Insecurity (08/05/2020)   Hunger Vital Sign    Worried About Running Out of Food in the Last Year: Never true    Ran Out of Food in the Last Year: Never true  Transportation Needs: No Transportation Needs (08/05/2020)   PRAPARE - Administrator, Civil Service (Medical): No    Lack of Transportation (Non-Medical): No  Physical Activity: Insufficiently Active (08/05/2020)   Exercise Vital Sign    Days of Exercise per Week: 3 days    Minutes of Exercise per Session: 20 min  Stress: Stress Concern Present (08/05/2020)   [employer] of Occupational Health - Occupational Stress Questionnaire    Feeling of Stress : To some extent  Social Connections: Moderately Isolated (08/05/2020)   Social Connection and Isolation  Panel [NHANES]    Frequency of Communication with Friends and Family: Once a week    Frequency of Social Gatherings with Friends and Family: Once a week    Attends Religious Services: More than 4 times per year    Active Member of [employer] or Organizations: No    Attends Banker Meetings: Never    Marital Status: Married  Catering manager Violence: Not At Risk (08/05/2020)   Humiliation, Afraid, Rape, and Kick questionnaire    Fear of Current or Ex-Partner: No    Emotionally Abused: No    Physically Abused: No    Sexually Abused: No    Review of Systems:  All other review of systems negative except as mentioned in the HPI.  Physical Exam: Vital signs BP 121/61   Pulse 73   Temp (!) 97.2 F (36.2 C)   Ht 5\' 3"  (1.6 m)   Wt 214 lb (97.1 kg)   SpO2 95%   BMI 37.91 kg/m  General:   Alert,  Well-developed, well-nourished, pleasant and cooperative in NAD Lungs:  Clear throughout to auscultation.   Heart:  Regular rate and rhythm; no murmurs, clicks, rubs,  or gallops. Abdomen:  Soft, nontender and nondistended. Normal bowel sounds.   Neuro/Psych:  Alert and cooperative. Normal mood and affect. A and O x 3   @Lajuan Kovaleski  Sena Slate, MD, [facility] [phone] (pager) 08/04/2023 10:14 AM@

## 2023-08-04 ENCOUNTER — Encounter: Payer: Self-pay | Admitting: Internal Medicine

## 2023-08-04 ENCOUNTER — Ambulatory Visit: Payer: Medicare HMO | Admitting: Internal Medicine

## 2023-08-04 VITALS — BP 120/65 | HR 61 | Temp 97.2°F | Resp 21 | Ht 63.0 in | Wt 214.0 lb

## 2023-08-04 DIAGNOSIS — Z1211 Encounter for screening for malignant neoplasm of colon: Secondary | ICD-10-CM | POA: Diagnosis not present

## 2023-08-04 DIAGNOSIS — E039 Hypothyroidism, unspecified: Secondary | ICD-10-CM | POA: Diagnosis not present

## 2023-08-04 DIAGNOSIS — I10 Essential (primary) hypertension: Secondary | ICD-10-CM | POA: Diagnosis not present

## 2023-08-04 DIAGNOSIS — Z860101 Personal history of adenomatous and serrated colon polyps: Secondary | ICD-10-CM

## 2023-08-04 DIAGNOSIS — D123 Benign neoplasm of transverse colon: Secondary | ICD-10-CM

## 2023-08-04 DIAGNOSIS — F411 Generalized anxiety disorder: Secondary | ICD-10-CM | POA: Diagnosis not present

## 2023-08-04 DIAGNOSIS — D120 Benign neoplasm of cecum: Secondary | ICD-10-CM | POA: Diagnosis not present

## 2023-08-04 DIAGNOSIS — Z860100 Personal history of colon polyps, unspecified: Secondary | ICD-10-CM

## 2023-08-04 DIAGNOSIS — F32A Depression, unspecified: Secondary | ICD-10-CM | POA: Diagnosis not present

## 2023-08-04 MED ORDER — SODIUM CHLORIDE 0.9 % IV SOLN: 500.0000 mL | Freq: Once | INTRAVENOUS | Status: DC

## 2023-08-04 NOTE — Patient Instructions (Addendum)
There were 2 small polyps - removed.  I will let you know pathology results and when/if to have another routine colonoscopy by mail and/or My Chart.  Since your iron has been low you should have an upper endoscopy. I am going to have my office contact you to schedule that.  I appreciate the opportunity to care for you. Iva Boop, MD, [facility]   Handout provided on polyps.  Upper endoscopy scheduled for 2/20 at 10:00am.  Please see letter for instructions.  If this does not work for you, please contact the office directly.    YOU HAD AN ENDOSCOPIC PROCEDURE TODAY AT THE [facility]:   Refer to the procedure report that was given to you for any specific questions about what was found during the examination.  If the procedure report does not answer your questions, please call your gastroenterologist to clarify.  If you requested that your care partner not be given the details of your procedure findings, then the procedure report has been included in a sealed envelope for you to review at your convenience later.  YOU SHOULD EXPECT: Some feelings of bloating in the abdomen. Passage of more gas than usual.  Walking can help get rid of the air that was put into your GI tract during the procedure and reduce the bloating. If you had a lower endoscopy (such as a colonoscopy or flexible sigmoidoscopy) you may notice spotting of blood in your stool or on the toilet paper. If you underwent a bowel prep for your procedure, you may not have a normal bowel movement for a few days.  Please Note:  You might notice some irritation and congestion in your nose or some drainage.  This is from the oxygen used during your procedure.  There is no need for concern and it should clear up in a day or so.  SYMPTOMS TO REPORT IMMEDIATELY:  Following lower endoscopy (colonoscopy or flexible sigmoidoscopy):  Excessive amounts of blood in the stool  Significant tenderness or worsening of abdominal  pains  Swelling of the abdomen that is new, acute  Fever of 100F or higher  For urgent or emergent issues, a gastroenterologist can be reached at any hour by calling [phone]. Do not use MyChart messaging for urgent concerns.    DIET:  We do recommend a small meal at first, but then you may proceed to your regular diet.  Drink plenty of fluids but you should avoid alcoholic beverages for 24 hours.  ACTIVITY:  You should plan to take it easy for the rest of today and you should NOT DRIVE or use heavy machinery until tomorrow (because of the sedation medicines used during the test).    FOLLOW UP: Our staff will call the number listed on your records the next business day following your procedure.  We will call around 7:15- 8:00 am to check on you and address any questions or concerns that you may have regarding the information given to you following your procedure. If we do not reach you, we will leave a message.     If any biopsies were taken you will be contacted by phone or by letter within the next 1-3 weeks.  Please call us at [phone] if you have not heard about the biopsies in 3 weeks.    SIGNATURES/CONFIDENTIALITY: You and/or your care partner have signed paperwork which will be entered into your electronic medical record.  These signatures attest to the fact that that the information above  on your After Visit Summary has been reviewed and is understood.  Full responsibility of the confidentiality of this discharge information lies with you and/or your care-partner.

## 2023-08-04 NOTE — Progress Notes (Signed)
Report to PACU, RN, vss, BBS= Clear.

## 2023-08-04 NOTE — Progress Notes (Signed)
Pt's states no medical or surgical changes since previsit or office visit.

## 2023-08-04 NOTE — Op Note (Signed)
[facility] Patient Name: Miranda Mathis Procedure Date: 08/04/2023 10:13 AM MRN: [id number] Endoscopist: Iva Boop , MD, [id number] Age: 73 Referring MD:  Date of Birth: 09-05-1950 Gender: Female Account #: [id number] Procedure:                Colonoscopy Indications:              Last colonoscopy: 2021 Medicines:                Monitored Anesthesia Care Procedure:                Pre-Anesthesia Assessment:                           - Prior to the procedure, a History and Physical                            was performed, and patient medications and                            allergies were reviewed. The patient's tolerance of                            previous anesthesia was also reviewed. The risks                            and benefits of the procedure and the sedation                            options and risks were discussed with the patient.                            All questions were answered, and informed consent                            was obtained. Prior Anticoagulants: The patient has                            taken no anticoagulant or antiplatelet agents. ASA                            Grade Assessment: III - A patient with severe                            systemic disease. After reviewing the risks and                            benefits, the patient was deemed in satisfactory                            condition to undergo the procedure.                           After obtaining informed consent, the colonoscope  was passed under direct vision. Throughout the                            procedure, the patient's blood pressure, pulse, and                            oxygen saturations were monitored continuously. The                            CF [id number] #[id number] was introduced through the anus                            and advanced to the the cecum, identified by                            appendiceal orifice and ileocecal  valve. The                            colonoscopy was performed without difficulty. The                            patient tolerated the procedure well. The quality                            of the bowel preparation was good. The ileocecal                            valve, appendiceal orifice, and rectum were                            photographed. The bowel preparation used was                            GoLYTELY via split dose instruction. Scope In: 10:22:58 AM Scope Out: 10:34:23 AM Scope Withdrawal Time: 0 hours 8 minutes 6 seconds  Total Procedure Duration: 0 hours 11 minutes 25 seconds  Findings:                 The perianal and digital rectal examinations were                            normal.                           Two sessile polyps were found in the proximal                            transverse colon and cecum. The polyps were                            diminutive in size. These polyps were removed with                            a cold snare. Resection and retrieval were  complete. Verification of patient identification                            for the specimen was done. Estimated blood loss was                            minimal.                           The exam was otherwise without abnormality on                            direct and retroflexion views. Complications:            No immediate complications. Estimated Blood Loss:     Estimated blood loss was minimal. Impression:               - Two diminutive polyps in the proximal transverse                            colon and in the cecum, removed with a cold snare.                            Resected and retrieved.                           - The examination was otherwise normal on direct                            and retroflexion views.                           - Personal history of colonic polyps. Recommendation:           - Patient has a contact number available for                             emergencies. The signs and symptoms of potential                            delayed complications were discussed with the                            patient. Return to normal activities tomorrow.                            Written discharge instructions were provided to the                            patient.                           - Resume previous diet.                           - Continue present medications.                           -  Await pathology results.                           - No recommendation at this time regarding repeat                            colonoscopy due to age.                           She has iron deficiency without anemia - office                            will contact and arrange an EGD to evaluate this                            since no cause of iron deficiency seen here Iva Boop, MD 08/04/2023 10:42:36 AM This report has been signed electronically.

## 2023-08-04 NOTE — Progress Notes (Signed)
Called to room to assist during endoscopic procedure.  Patient ID and intended procedure confirmed with present staff. Received instructions for my participation in the procedure from the performing physician.

## 2023-08-05 ENCOUNTER — Telehealth: Payer: Self-pay

## 2023-08-05 NOTE — Telephone Encounter (Signed)
  Follow up Call-     08/04/2023    9:44 AM  Call back number  Post procedure Call Back phone  # [phone]  Permission to leave phone message No     Patient questions:  Do you have a fever, pain , or abdominal swelling? No. Pain Score  0 *  Have you tolerated food without any problems? Yes.    Have you been able to return to your normal activities? Yes.    Do you have any questions about your discharge instructions: Diet   No. Medications  No. Follow up visit  No.  Do you have questions or concerns about your Care? No.  Actions: * If pain score is 4 or above: No action needed, pain <4.

## 2023-08-09 LAB — SURGICAL PATHOLOGY

## 2023-08-10 ENCOUNTER — Encounter: Payer: Self-pay | Admitting: Internal Medicine

## 2023-08-11 ENCOUNTER — Telehealth: Payer: Self-pay | Admitting: Internal Medicine

## 2023-08-11 NOTE — Telephone Encounter (Signed)
OK no charge ?

## 2023-08-11 NOTE — Telephone Encounter (Signed)
 Goodmorning Dr. Leone Payor,    Patient wished to cancel scheduled procedure for tomorrow morning due to weather. Stated she will call back to reschedule. Please advise, thank you.    Thank you.

## 2023-08-12 ENCOUNTER — Other Ambulatory Visit: Payer: Medicare HMO | Admitting: Internal Medicine

## 2023-08-15 ENCOUNTER — Other Ambulatory Visit: Payer: Self-pay | Admitting: Family Medicine

## 2023-08-16 ENCOUNTER — Encounter: Payer: Self-pay | Admitting: Family Medicine

## 2023-08-16 ENCOUNTER — Ambulatory Visit (INDEPENDENT_AMBULATORY_CARE_PROVIDER_SITE_OTHER): Payer: Medicare HMO | Admitting: Family Medicine

## 2023-08-16 VITALS — BP 139/83 | HR 87 | Temp 97.7°F | Ht 63.0 in | Wt 213.0 lb

## 2023-08-16 DIAGNOSIS — Z6837 Body mass index (BMI) 37.0-37.9, adult: Secondary | ICD-10-CM | POA: Diagnosis not present

## 2023-08-16 DIAGNOSIS — Z780 Asymptomatic menopausal state: Secondary | ICD-10-CM

## 2023-08-16 DIAGNOSIS — F5101 Primary insomnia: Secondary | ICD-10-CM

## 2023-08-16 DIAGNOSIS — E782 Mixed hyperlipidemia: Secondary | ICD-10-CM | POA: Diagnosis not present

## 2023-08-16 DIAGNOSIS — Z0001 Encounter for general adult medical examination with abnormal findings: Secondary | ICD-10-CM | POA: Diagnosis not present

## 2023-08-16 DIAGNOSIS — E1142 Type 2 diabetes mellitus with diabetic polyneuropathy: Secondary | ICD-10-CM | POA: Diagnosis not present

## 2023-08-16 DIAGNOSIS — N1831 Chronic kidney disease, stage 3a: Secondary | ICD-10-CM

## 2023-08-16 DIAGNOSIS — E039 Hypothyroidism, unspecified: Secondary | ICD-10-CM | POA: Diagnosis not present

## 2023-08-16 DIAGNOSIS — M8580 Other specified disorders of bone density and structure, unspecified site: Secondary | ICD-10-CM | POA: Diagnosis not present

## 2023-08-16 DIAGNOSIS — D126 Benign neoplasm of colon, unspecified: Secondary | ICD-10-CM

## 2023-08-16 DIAGNOSIS — M797 Fibromyalgia: Secondary | ICD-10-CM

## 2023-08-16 DIAGNOSIS — F321 Major depressive disorder, single episode, moderate: Secondary | ICD-10-CM

## 2023-08-16 LAB — CBC WITH DIFFERENTIAL/PLATELET
Basophils Absolute: 0 10*3/uL (ref 0.0–0.1)
Basophils Relative: 0.6 % (ref 0.0–3.0)
Eosinophils Absolute: 0.2 10*3/uL (ref 0.0–0.7)
Eosinophils Relative: 2.5 % (ref 0.0–5.0)
HCT: 39.1 % (ref 36.0–46.0)
Hemoglobin: 12.6 g/dL (ref 12.0–15.0)
Lymphocytes Relative: 15.1 % (ref 12.0–46.0)
Lymphs Abs: 1 10*3/uL (ref 0.7–4.0)
MCHC: 32.2 g/dL (ref 30.0–36.0)
MCV: 85 fl (ref 78.0–100.0)
Monocytes Absolute: 0.5 10*3/uL (ref 0.1–1.0)
Monocytes Relative: 7 % (ref 3.0–12.0)
Neutro Abs: 4.8 10*3/uL (ref 1.4–7.7)
Neutrophils Relative %: 74.8 % (ref 43.0–77.0)
Platelets: 203 10*3/uL (ref 150.0–400.0)
RBC: 4.6 Mil/uL (ref 3.87–5.11)
RDW: 15.1 % (ref 11.5–15.5)
WBC: 6.5 10*3/uL (ref 4.0–10.5)

## 2023-08-16 LAB — TSH: TSH: 0.55 u[IU]/mL (ref 0.35–5.50)

## 2023-08-16 LAB — LIPID PANEL
Cholesterol: 121 mg/dL (ref 0–200)
HDL: 40.2 mg/dL (ref >39.00)
LDL Cholesterol: 58 mg/dL (ref 0–99)
NonHDL: 80.89
Total CHOL/HDL Ratio: 3
Triglycerides: 113 mg/dL (ref 0.0–149.0)
VLDL: 22.6 mg/dL (ref 0.0–40.0)

## 2023-08-16 LAB — COMPREHENSIVE METABOLIC PANEL
ALT: 32 U/L (ref 0–35)
AST: 49 U/L — ABNORMAL HIGH (ref 0–37)
Albumin: 4.1 g/dL (ref 3.5–5.2)
Alkaline Phosphatase: 54 U/L (ref 39–117)
BUN: 16 mg/dL (ref 6–23)
CO2: 24 meq/L (ref 19–32)
Calcium: 9.4 mg/dL (ref 8.4–10.5)
Chloride: 102 meq/L (ref 96–112)
Creatinine, Ser: 1.08 mg/dL (ref 0.40–1.20)
GFR: 51.06 mL/min — ABNORMAL LOW (ref >60.00)
Glucose, Bld: 161 mg/dL — ABNORMAL HIGH (ref 70–99)
Potassium: 3.6 meq/L (ref 3.5–5.1)
Sodium: 139 meq/L (ref 135–145)
Total Bilirubin: 0.5 mg/dL (ref 0.2–1.2)
Total Protein: 7.6 g/dL (ref 6.0–8.3)

## 2023-08-16 LAB — POCT GLYCOSYLATED HEMOGLOBIN (HGB A1C): Hemoglobin A1C: 6.3 % — AB (ref 4.0–5.6)

## 2023-08-16 LAB — MICROALBUMIN / CREATININE URINE RATIO
Creatinine,U: 94.3 mg/dL
Microalb Creat Ratio: 15.1 mg/g (ref 0.0–30.0)
Microalb, Ur: 1.4 mg/dL (ref 0.0–1.9)

## 2023-08-16 LAB — HEMOGLOBIN A1C: Hgb A1c MFr Bld: 7 % — ABNORMAL HIGH (ref 4.6–6.5)

## 2023-08-16 MED ORDER — TRAMADOL HCL 50 MG PO TABS: 50.0000 mg | ORAL_TABLET | Freq: Two times a day (BID) | ORAL | 2 refills | Status: DC | PRN

## 2023-08-16 MED ORDER — ZOLPIDEM TARTRATE 10 MG PO TABS: 10.0000 mg | ORAL_TABLET | Freq: Every day | ORAL | 5 refills | Status: DC

## 2023-08-16 MED ORDER — LISINOPRIL 5 MG PO TABS: 5.0000 mg | ORAL_TABLET | Freq: Every day | ORAL | Status: DC

## 2023-08-16 NOTE — Progress Notes (Signed)
 Subjective  Annual exam and chronic problem f/u  HPI: Miranda Mathis is a 73 y.o. female who presents to [facility] today for a Female Wellness Visit. She also has the concerns and/or needs as listed above in the chief complaint. These will be addressed in addition to the Health Maintenance Visit.   Wellness Visit: annual visit with health maintenance review and exam  HM: had colonoscopy: tubular adenoma x 2 removed; Dr. Leone Payor recs no further f/u. She is feeling well. Mammo up to date. Imms current.  Chronic disease f/u and/or acute problem visit: (deemed necessary to be done in addition to the wellness visit): Osteopenia: reviewed recent bone density. Mildly worsening osteopenia w/ lowest T = -2.3 at left femur.  DM: added gluctorol xl 5mg  daily to metforming (other medications were cost prohibitive). Doing well w/o lows. No foot sores. Eye exam current. On ace and statin.  Due eye exam.  HTN: back on lisinopril 5mg  daily. Occ sxs of orthostatic hypotension and palpitation w/o cp.  Low thyroid has been clinically stable again. Last tsh was at goal after adjusting dose; now on daily. Due for recheck. Chronic back pain and fibromyalgia: request tramadol refill. Prn use.  Mood is stable Tolerates statin. Insomnia on chronic ambien. Discussed risks. Has cut down to 5mg  nightly.   Assessment  1. Encounter for well adult exam with abnormal findings   2. DM type 2 with diabetic peripheral neuropathy (HCC)   3. Acquired hypothyroidism   4. Fibromyalgia   5. Mixed hyperlipidemia   6. Stage 3a chronic kidney disease (HCC)   7. Tubular adenoma of colon   8. Depression, major, single episode, moderate (HCC)   9. Osteopenia after menopause   10. Obesity, morbid (HCC) Chronic  11. Primary insomnia      Plan  Female Wellness Visit: Age appropriate Health Maintenance and Prevention measures were discussed with patient. Included topics are cancer screening  recommendations, ways to keep healthy (see AVS) including dietary and exercise recommendations, regular eye and dental care, use of seat belts, and avoidance of moderate alcohol use and tobacco use. utd BMI: discussed patient's BMI and encouraged positive lifestyle modifications to help get to or maintain a target BMI. HM needs and immunizations were addressed and ordered. See below for orders. See HM and immunization section for updates. Routine labs and screening tests ordered including cmp, cbc and lipids where appropriate. Discussed recommendations regarding Vit D and calcium supplementation (see AVS)  Chronic disease management visit and/or acute problem visit: DM is now well controlled Recheck lipids on statin Recheck tsh on levothyroxine daily Chronic pain and fibro: continue gabapentin and prn tramadol and f/u with specialists Knee OA: to see ortho to discuss TKR Tubular adenoma and h/o iron deficiency anemia: cleared by colonoscopy. Recheck cbc today Osteopenia: weight bearing exercise and recheck dexa in 2 years.  HTN: bp is normal upon recheck. Rec home monitoring and will lower lisinopril dose to 2.5mg  dialy if needed.  Lonni Fix 5  Follow up: 3 mo for dm and bp recheck  Orders Placed This Encounter  Procedures   CBC with Differential/Platelet   Comprehensive metabolic panel   Lipid panel   Hemoglobin A1c   TSH   Microalbumin / creatinine urine ratio   POCT HgB A1C   Meds ordered this encounter  Medications   zolpidem (AMBIEN) 10 MG tablet    Sig: Take 1 tablet (10 mg total) by mouth at bedtime.  Dispense:  30 tablet    Refill:  5    This request is for a new prescription for a controlled substance as required by Federal/State law.   lisinopril (ZESTRIL) 5 MG tablet    Sig: Take 1 tablet (5 mg total) by mouth daily.   traMADol (ULTRAM) 50 MG tablet    Sig: Take 1 tablet (50 mg total) by mouth 2 (two) times daily as needed for moderate pain (pain score  4-6).    Dispense:  30 tablet    Refill:  2      Body mass index is 37.73 kg/m. Wt Readings from Last 3 Encounters:  08/16/23 213 lb (96.6 kg)  08/04/23 214 lb (97.1 kg)  07/26/23 214 lb (97.1 kg)     Patient Active Problem List   Diagnosis Date Noted Date Diagnosed   Stage 3a chronic kidney disease (HCC) 05/13/2023     Priority: High   Tubular adenoma of colon 01/26/2023     Priority: High    Dr. Leone Payor, colonoscopy 2021. Repeat in 3 years.    Depression, major, single episode, moderate (HCC) 07/11/2020     Priority: High   DM type 2 with diabetic peripheral neuropathy (HCC) 09/02/2016     Priority: High    Diagnosed 2019; started meds 2020; + microalbunuria on ace, met 1000 bid Added Gluctotrol xl 5mg  11.2024 (farxiga not covered, glp-1 expensive)    Recurrent kidney stones 08/29/2015     Priority: High    Overview:  S/p stents, multiple, [facility], Dr. Lenore Cordia    GAD (generalized anxiety disorder) 08/29/2015     Priority: High   Acquired hypothyroidism 11/02/2013     Priority: High   Mixed hyperlipidemia 04/04/2012     Priority: High   Fibromyalgia 10/11/2007     Priority: High   Chronic low back pain 10/11/2007     Priority: High   Chronic narcotic use 07/28/2022     Priority: Medium    Lower extremity edema 04/15/2021     Priority: Medium    Osteopenia after menopause 06/06/2019     Priority: Medium     dexa 2019 T = -1.9, multiple site osteopenia;  DEXA December 2022, osteopenia DEXA 07/2033 lowest T = -2.3, osteopenia. Recheck 2 years. Routine conservative mgt discussed.    Bilateral hydronephrosis      Priority: Medium    Degenerative scoliosis 09/07/2018     Priority: Medium    Psoriasis 12/02/2017     Priority: Medium    History of Barrett's esophagus 08/29/2015     Priority: Medium     Last EGD 2018, no evidence of Barrett's esophagus, no further EGD recommended    GERD (gastroesophageal reflux disease) 08/29/2015     Priority: Medium     Irritable bowel syndrome with constipation 08/29/2015     Priority: Medium    Lumbosacral spondylosis with radiculopathy 08/29/2015     Priority: Medium    Barrett's esophagus with esophagitis 08/29/2015     Priority: Medium     Formatting of this note might be different from the original. Last EGD 2018, no evidence of Barrett's esophagus, no further EGD recommended    Primary insomnia 10/25/2014     Priority: Medium     Overview:  Last Assessment & Plan:  Insomnia is stable with current regimen of Ambien. Continue current dosage of Ambien.    Seborrheic dermatitis of scalp 12/02/2017     Priority: Low   Allergic rhinitis 10/11/2007  Priority: Low    Qualifier: Diagnosis of  By: Briscoe Burns CMA, Lakisha      Obesity, morbid (HCC) 08/16/2023    Health Maintenance  Topic Date Due   Medicare Annual Wellness (AWV)  08/05/2021   OPHTHALMOLOGY EXAM  07/09/2023   Diabetic kidney evaluation - eGFR measurement  01/26/2024   Diabetic kidney evaluation - Urine ACR  01/26/2024   HEMOGLOBIN A1C  02/13/2024   MAMMOGRAM  07/21/2024   FOOT EXAM  08/15/2024   DEXA SCAN  07/21/2025   DTaP/Tdap/Td (3 - Td or Tdap) 09/15/2030   Pneumonia Vaccine 48+ Years old  Completed   INFLUENZA VACCINE  Completed   COVID-19 Vaccine  Completed   Hepatitis C Screening  Completed   HPV VACCINES  Aged Out   Colonoscopy  Discontinued   Zoster Vaccines- Shingrix  Discontinued   Immunization History  Administered Date(s) Administered   Fluad Quad(high Dose 65+) 04/13/2019, 04/04/2020, 04/15/2021, 04/08/2022   Fluad Trivalent(High Dose 65+) 05/09/2023   H1N1 03/22/2021   Influenza Whole 03/22/2001, 03/22/2006, 03/22/2010   Influenza, High Dose Seasonal PF 02/19/2016, 06/02/2018, 04/08/2022, 05/09/2023   Influenza, Seasonal, Injecte, Preservative Fre 06/27/2012   Influenza-Unspecified 06/27/2012, 04/13/2019   Moderna Sars-Covid-2 Vaccination 08/14/2019   PFIZER(Purple Top)SARS-COV-2 Vaccination  07/17/2019, 07/24/2019, 05/08/2020   Pfizer Covid-19 Vaccine Bivalent Booster 57yrs & up 04/08/2022, 05/09/2023   Pfizer(Comirnaty)Fall Seasonal Vaccine 12 years and older 04/08/2022, 05/09/2023   Pneumococcal Conjugate-13 11/27/2015   Pneumococcal Polysaccharide-23 11/13/2010, 07/16/2021   Respiratory Syncytial Virus Vaccine,Recomb Aduvanted(Arexvy) 05/23/2023   Tdap 11/13/2010, 09/14/2020   Zoster Recombinant(Shingrix) 04/13/2019   Zoster, Live 11/27/2015   We updated and reviewed the patient's past history in detail and it is documented below. Allergies: Patient is allergic to ketorolac. Past Medical History Patient  has a past medical history of Allergic rhinitis, Allergy, Barrett's esophagus, Bladder spasms, Cataract, Chronic back pain, DDD (degenerative disc disease), lumbar, Degenerative scoliosis, Depression, Diabetes mellitus without complication (HCC), Diabetic peripheral neuropathy (HCC), Fibromyalgia, Full dentures, GAD (generalized anxiety disorder), GERD (gastroesophageal reflux disease), History of bladder stone, History of colon polyps, History of kidney stones, History of migraine, History of recurrent UTIs, History of sepsis, Hydronephrosis, Hydronephrosis, bilateral, Hyperlipidemia, Hypertension, Hypothyroidism (11/02/2013 dx), IBS (irritable bowel syndrome), Insomnia, Osteopenia, Osteopenia after menopause (06/06/2019), Peripheral neuropathy, Psoriasis, Restless leg syndrome, Seborrheic dermatitis of scalp, Spondylolysis of lumbar region, and Wears glasses. Past Surgical History Patient  has a past surgical history that includes Cesarean section (X2); CYSTO/ BILATERAL URETEROSCOPY / URETERAL BX'S/ BILATERAL URETERAL STENT PLACEMENT/ BLADDER STONE EXTRACTION (03-13-2011); RIGHT URETEROSCOPIC / URETERAL BX/ STENT PLACEMENT (04-17-2011); RIGHT FOOT SURG (2003); Lumbar microdiscectomy (1990'S); Abdominal hysterectomy (1987); Cystoscopy w/ retrogrades (07/10/2011); Balloon dilation  (07/10/2011); Cystoscopy w/ ureteral stent removal (07/10/2011); Cystoscopy w/ ureteral stent placement (07/10/2011); Cystoscopy w/ ureteral stent placement (Bilateral, 10/22/2018); Cholecystectomy; Cystoscopy with ureteroscopy and stent placement (Bilateral, 11/29/2018); Upper gi endoscopy; Cystoscopy/retrograde/ureteroscopy (Left, 11/14/2019); Colonoscopy (2018); and Upper gastrointestinal endoscopy (2018). Family History: Patient family history includes Arthritis in her brother, daughter, father, maternal grandmother, and mother; Asthma in her daughter; Birth defects in her daughter and father; COPD in her mother; Cancer in her father; Coronary artery disease in her paternal grandfather; Depression in her daughter; Diabetes in her paternal aunt; Emphysema in her mother; Hearing loss in her daughter; Hyperlipidemia in her brother; Hypertension in her father and mother; Kidney Stones in her daughter; Mental retardation in her daughter; Osteoarthritis in her mother; Prostate cancer in her father; Rheum arthritis in her mother. Social History:  Patient  reports that she has never smoked. She has never used smokeless tobacco. She reports current alcohol use of about 1.0 standard drink of alcohol per week. She reports that she does not use drugs.  Review of Systems: Constitutional: negative for fever or malaise Ophthalmic: negative for photophobia, double vision or loss of vision Cardiovascular: negative for chest pain, dyspnea on exertion, or new LE swelling Respiratory: negative for SOB or persistent cough Gastrointestinal: negative for abdominal pain, change in bowel habits or melena Genitourinary: negative for dysuria or gross hematuria, no abnormal uterine bleeding or disharge Musculoskeletal: negative for new gait disturbance or muscular weakness Integumentary: negative for new or persistent rashes, no breast lumps Neurological: negative for TIA or stroke symptoms Psychiatric: negative for SI or  delusions Allergic/Immunologic: negative for hives  Patient Care Team    Relationship Specialty Notifications Start End  Willow Ora, MD PCP - General Family Medicine  12/02/17   Jerilee Field, MD  Urology  10/06/13   Iva Boop, MD  Gastroenterology  10/06/13   Donna Christen, OD  Optometry  12/02/17   Venita Lick, MD Consulting Physician Orthopedic Surgery  09/15/18   Glyn Ade, PA-C Physician Assistant Dermatology  10/20/19   Dahlia Byes, [facility] Pharmacist Pharmacist  10/25/20    Comment: Phone [phone]    Objective  Vitals: BP 139/83   Pulse 87   Temp 97.7 F (36.5 C)   Ht 5\' 3"  (1.6 m)   Wt 213 lb (96.6 kg)   SpO2 95%   BMI 37.73 kg/m  General:  Well developed, well nourished, no acute distress  Psych:  Alert and orientedx3,normal mood and affect HEENT:  Normocephalic, atraumatic, non-icteric sclera,  supple neck without adenopathy, mass or thyromegaly Cardiovascular:  Normal S1, S2, RRR without gallop, rub or murmur Respiratory:  Good breath sounds bilaterally, CTAB with normal respiratory effort Gastrointestinal: normal bowel sounds, soft, non-tender, no noted masses. No HSM MSK: extremities without edema, joints without erythema or swelling Neurologic:    Mental status is normal.  Gross motor and sensory exams are normal.  No tremor Diabetic Foot Exam: Appearance - no lesions, ulcers or significant calluses Skin - no sigificant pallor or erythema Normal sensation Pulses - +2 distally bilaterally  Commons side effects, risks, benefits, and alternatives for medications and treatment plan prescribed today were discussed, and the patient expressed understanding of the given instructions. Patient is instructed to call or message via MyChart if he/she has any questions or concerns regarding our treatment plan. No barriers to understanding were identified. We discussed Red Flag symptoms and signs in detail. Patient expressed understanding regarding what to  do in case of urgent or emergency type symptoms.  Medication list was reconciled, printed and provided to the patient in AVS. Patient instructions and summary information was reviewed with the patient as documented in the AVS. This note was prepared with assistance of Dragon voice recognition software. Occasional wrong-word or sound-a-like substitutions may have occurred due to the inherent limitations of voice recognition software

## 2023-08-16 NOTE — Patient Instructions (Signed)
 Please return in 3 months to recheck diabetes and blood pressure    I will release your lab results to you on your MyChart account with further instructions. You may see the results before I do, but when I review them I will send you a message with my report or have my assistant call you if things need to be discussed. Please reply to my message with any questions. Thank you!   You may use Lubriderm, Cetaphil or Eucerin moisturizing lotion to help manage you dry skin.   Start checking your blood pressures and write down the numbers for your next visit.   If you have any questions or concerns, please don't hesitate to send me a message via MyChart or call the office at [phone]. Thank you for visiting with [name] today! It's our pleasure caring for you.

## 2023-08-23 ENCOUNTER — Encounter: Payer: Self-pay | Admitting: Family Medicine

## 2023-08-23 NOTE — Progress Notes (Signed)
 See my chart note.

## 2023-09-07 ENCOUNTER — Other Ambulatory Visit: Payer: Self-pay | Admitting: Family Medicine

## 2023-10-20 ENCOUNTER — Other Ambulatory Visit: Payer: Self-pay | Admitting: Family Medicine

## 2023-10-20 NOTE — Telephone Encounter (Signed)
 08/16/2023 LOV  08/16/2023  30/5 refills

## 2023-10-23 ENCOUNTER — Other Ambulatory Visit: Payer: Self-pay | Admitting: Family Medicine

## 2023-11-16 ENCOUNTER — Encounter: Payer: Self-pay | Admitting: Family Medicine

## 2023-11-16 ENCOUNTER — Ambulatory Visit (INDEPENDENT_AMBULATORY_CARE_PROVIDER_SITE_OTHER): Payer: Medicare HMO | Admitting: Family Medicine

## 2023-11-16 VITALS — BP 112/52 | HR 73 | Temp 97.7°F | Ht 63.0 in | Wt 212.0 lb

## 2023-11-16 DIAGNOSIS — N1831 Chronic kidney disease, stage 3a: Secondary | ICD-10-CM

## 2023-11-16 DIAGNOSIS — E1142 Type 2 diabetes mellitus with diabetic polyneuropathy: Secondary | ICD-10-CM

## 2023-11-16 DIAGNOSIS — G2581 Restless legs syndrome: Secondary | ICD-10-CM

## 2023-11-16 DIAGNOSIS — E119 Type 2 diabetes mellitus without complications: Secondary | ICD-10-CM

## 2023-11-16 DIAGNOSIS — Z7984 Long term (current) use of oral hypoglycemic drugs: Secondary | ICD-10-CM

## 2023-11-16 LAB — POCT GLYCOSYLATED HEMOGLOBIN (HGB A1C): Hemoglobin A1C: 5.9 % — AB (ref 4.0–5.6)

## 2023-11-16 MED ORDER — ROPINIROLE HCL 2 MG PO TABS: 2.0000 mg | ORAL_TABLET | Freq: Every day | ORAL | 3 refills | Status: AC

## 2023-11-16 NOTE — Patient Instructions (Signed)
 Please return in 6 months to recheck diabetes and blood pressure   If you have any questions or concerns, please don't hesitate to send me a message via MyChart or call the office at [phone]. Thank you for visiting with us  today! It's our pleasure caring for you.   Restless Legs Syndrome Restless legs syndrome is a condition that causes uncomfortable feelings or sensations in the legs, especially while sitting or lying down. The sensations usually cause an overwhelming urge to move the legs. The arms can also sometimes be affected. The condition can range from mild to severe. The symptoms often interfere with a person's ability to sleep. What are the causes? The cause of this condition is not known. What increases the risk? The following factors may make you more likely to develop this condition: Being older than 50. Pregnancy. Being a woman. In general, the condition is more common in women than in men. A family history of the condition. Having iron deficiency. Overuse of caffeine, nicotine, or alcohol. Certain medical conditions, such as kidney disease, Parkinson's disease, or nerve damage. Certain medicines, such as those for high blood pressure, nausea, colds, allergies, depression, and some heart conditions. What are the signs or symptoms? The main symptom of this condition is uncomfortable sensations in the legs, such as: Pulling. Tingling. Prickling. Throbbing. Crawling. Burning. Usually, the sensations: Affect both sides of the body. Are worse when you sit or lie down. Are worse at night. These may make it difficult to fall asleep. Make you have a strong urge to move your legs. Are temporarily relieved by moving your legs or standing. The arms can also be affected, but this is rare. People who have this condition often have tiredness during the day because of their lack of sleep at night. How is this diagnosed? This condition may be diagnosed based on: Your  symptoms. Blood tests. In some cases, you may be monitored in a sleep lab by a specialist (a sleep study). This can detect any disruptions in your sleep. How is this treated? This condition is treated by managing the symptoms. This may include: Lifestyle changes, such as exercising, using relaxation techniques, and avoiding caffeine, alcohol, or tobacco. Iron supplements. Medicines. Parkinson's medications may be tried first. Anti-seizure medications can also be helpful. Follow these instructions at home: General instructions Take over-the-counter and prescription medicines only as told by your health care provider. Use methods to help relieve the uncomfortable sensations, such as: Massaging your legs. Walking or stretching. Taking a cold or hot bath. Keep all follow-up visits. This is important. Lifestyle     Practice good sleep habits. For example, go to bed and get up at the same time every day. Most adults should get 7-9 hours of sleep each night. Exercise regularly. Try to get at least 30 minutes of exercise most days of the week. Practice ways of relaxing, such as yoga or meditation. Avoid caffeine and alcohol. Do not use any products that contain nicotine or tobacco. These products include cigarettes, chewing tobacco, and vaping devices, such as e-cigarettes. If you need help quitting, ask your health care provider. Where to find more information [employer] of Neurological Disorders and Stroke: [employer] Contact a health care provider if: Your symptoms get worse or they do not improve with treatment. Summary Restless legs syndrome is a condition that causes uncomfortable feelings or sensations in the legs, especially while sitting or lying down. The symptoms often interfere with your ability to sleep. This condition is treated by  managing the symptoms. You may need to make lifestyle changes or take medicines. This information is not intended to replace advice  given to you by your health care provider. Make sure you discuss any questions you have with your health care provider. Document Revised: 01/19/2021 Document Reviewed: 01/19/2021 Elsevier Patient Education  2024 [employer].

## 2023-11-16 NOTE — Progress Notes (Signed)
 Subjective  CC:  Chief Complaint  Patient presents with   Hypertension   Diabetes    Eye exam is scheduled for June    HPI: Miranda Mathis is a 73 y.o. female who presents to the office today for follow up of diabetes and problems listed above in the chief complaint.  Discussed the use of AI scribe software for clinical note transcription with the patient, who gave verbal consent to proceed.  History of Present Illness Miranda Mathis is a 73 year old female with diabetes who presents for follow-up of her diabetes management and knee pain.  Her diabetes management has improved, with her A1c decreasing to 5.9 from 7.0. She attributes this to dietary changes, specifically reducing sweets like cookies, candies, and cakes. She is taking Glucotrol  and met 1000 bid. She experiences hunger spells after taking her medication, particularly before meals.  She experiences knee pain that limits walking and causes difficulty standing from a seated position. The pain is located underneath the knee and is associated with weakness, making it difficult to get up from the floor. She was informed by an orthopedic doctor that she has 'bone on bone' arthritis and has previously received injections but is currently behind on these treatments. She uses [facility] for relief but has not tried Voltaren gel, which is available to her through [facility].  She experiences dizziness upon standing, which she manages by waiting a minute or two before moving. A recent blood pressure reading at a drugstore was 153/90, although she experienced an unusually low reading earlier today. She is currently taking lisinopril  5  She suffers from restless leg syndrome at night, describing it as feeling like she is 'climbing a mountain.' She is taking gabapentin  for neuropathy, which includes symptoms of numbness, tingling, and burning in her feet. She sometimes takes an extra dose of gabapentin  for relief.    Wt Readings from Last 3  Encounters:  11/16/23 212 lb (96.2 kg)  08/16/23 213 lb (96.6 kg)  08/04/23 214 lb (97.1 kg)    BP Readings from Last 3 Encounters:  11/16/23 (!) 112/52  08/16/23 139/83  08/04/23 120/65    Assessment  1. DM type 2 with diabetic peripheral neuropathy (HCC)   2. Stage 3a chronic kidney disease (HCC)   3. Restless leg syndrome   4. Diabetes mellitus treated with oral medication (HCC)      Plan  Assessment and Plan Assessment & Plan Knee osteoarthritis Chronic knee osteoarthritis with pain limiting ambulation and weakness. Concerns about steroid injections affecting diabetes management. - Use Voltaren gel up to four times daily for knee pain. - Perform quadriceps strengthening exercises. - Discuss potential impact of steroid injections on glycemic control.  Type 2 diabetes mellitus Type 2 diabetes mellitus well-controlled with A1c of 5.9. Glucotrol  effective. Reduced sweets intake improved glycemic control. - Continue Glucotrol  and met 1000 bid - Encourage continued dietary modifications, specifically reducing sweets. - Check A1c at the next visit with blood draw to be sure accurate  Low bp this am: monitor. Ace 5mg  for renal protection. If persists will cut back  Peripheral neuropathy Peripheral neuropathy with numbness, tingling, and burning in feet. Gabapentin  provides some relief. - Continue gabapentin  for neuropathy management.  Restless legs syndrome Restless legs syndrome causing significant nocturnal discomfort. Gabapentin  provides some relief but not fully effective. - Prescribe ropinirole 2mg  nightly to be taken at night before bedtime in addition to gabapentin .    Follow up: 6 mo for recheck Orders  Placed This Encounter  Procedures   POCT HgB A1C   Meds ordered this encounter  Medications   rOPINIRole  (REQUIP ) 2 MG tablet    Sig: Take 1 tablet (2 mg total) by mouth at bedtime.    Dispense:  90 tablet    Refill:  3      Immunization History   Administered Date(s) Administered   Fluad Quad(high Dose 65+) 04/13/2019, 04/04/2020, 04/15/2021, 04/08/2022   Fluad Trivalent(High Dose 65+) 05/09/2023   H1N1 03/22/2021   Influenza Whole 03/22/2001, 03/22/2006, 03/22/2010   Influenza, High Dose Seasonal PF 02/19/2016, 06/02/2018, 04/08/2022, 05/09/2023   Influenza, Seasonal, Injecte, Preservative Fre 06/27/2012   Influenza-Unspecified 06/27/2012, 04/13/2019   Moderna Sars-Covid-2 Vaccination 08/14/2019   PFIZER(Purple Top)SARS-COV-2 Vaccination 07/17/2019, 07/24/2019, 05/08/2020   Pfizer Covid-19 Vaccine Bivalent Booster 110yrs & up 04/08/2022, 05/09/2023   Pfizer(Comirnaty)Fall Seasonal Vaccine 12 years and older 04/08/2022, 05/09/2023   Pneumococcal Conjugate-13 11/27/2015   Pneumococcal Polysaccharide-23 11/13/2010, 07/16/2021   Respiratory Syncytial Virus Vaccine,Recomb Aduvanted(Arexvy) 05/23/2023   Tdap 11/13/2010, 09/14/2020   Zoster Recombinant(Shingrix ) 04/13/2019   Zoster, Live 11/27/2015    Diabetes Related Lab Review: Lab Results  Component Value Date   HGBA1C 5.9 (A) 11/16/2023   HGBA1C 7.0 (H) 08/16/2023   HGBA1C 6.3 (A) 08/16/2023    Lab Results  Component Value Date   MICROALBUR 1.4 08/16/2023   Lab Results  Component Value Date   CREATININE 1.08 08/16/2023   BUN 16 08/16/2023   NA 139 08/16/2023   K 3.6 08/16/2023   CL 102 08/16/2023   CO2 24 08/16/2023   Lab Results  Component Value Date   CHOL 121 08/16/2023   CHOL 105 07/28/2022   CHOL 122 07/16/2021   Lab Results  Component Value Date   HDL 40.20 08/16/2023   HDL 38.70 (L) 07/28/2022   HDL 36.70 (L) 07/16/2021   Lab Results  Component Value Date   LDLCALC 58 08/16/2023   LDLCALC 46 07/28/2022   LDLCALC 64 07/16/2021   Lab Results  Component Value Date   TRIG 113.0 08/16/2023   TRIG 102.0 07/28/2022   TRIG 108.0 07/16/2021   Lab Results  Component Value Date   CHOLHDL 3 08/16/2023   CHOLHDL 3 07/28/2022   CHOLHDL 3 07/16/2021    Lab Results  Component Value Date   LDLDIRECT 165.3 11/13/2010   LDLDIRECT 143.5 08/30/2008   The ASCVD Risk score (Arnett DK, et al., 2019) failed to calculate for the following reasons:   The valid total cholesterol range is 130 to 320 mg/dL I have reviewed the PMH, Fam and Soc history. Patient Active Problem List   Diagnosis Date Noted Date Diagnosed   Stage 3a chronic kidney disease (HCC) 05/13/2023     Priority: High   Tubular adenoma of colon 01/26/2023     Priority: High    Dr. Willy Harvest, colonoscopy 2021. Repeat in 3 years.    Depression, major, single episode, moderate (HCC) 07/11/2020     Priority: High   DM type 2 with diabetic peripheral neuropathy (HCC) 09/02/2016     Priority: High    Diagnosed 2019; started meds 2020; + microalbunuria on ace, met 1000 bid Added Gluctotrol xl 5mg  11.2024 (farxiga  not covered, glp-1 expensive)    Recurrent kidney stones 08/29/2015     Priority: High    Overview:  S/p stents, multiple, [facility], Dr. Ileene Mallick    GAD (generalized anxiety disorder) 08/29/2015     Priority: High   Acquired hypothyroidism 11/02/2013  Priority: High   Mixed hyperlipidemia 04/04/2012     Priority: High   Fibromyalgia 10/11/2007     Priority: High   Chronic low back pain 10/11/2007     Priority: High   Chronic narcotic use 07/28/2022     Priority: Medium    Lower extremity edema 04/15/2021     Priority: Medium    Osteopenia after menopause 06/06/2019     Priority: Medium     dexa 2019 T = -1.9, multiple site osteopenia;  DEXA December 2022, osteopenia DEXA 07/2023 lowest T = -2.3, osteopenia. Recheck 2 years. Routine conservative mgt discussed.    Bilateral hydronephrosis      Priority: Medium    Degenerative scoliosis 09/07/2018     Priority: Medium    Psoriasis 12/02/2017     Priority: Medium    History of Barrett's esophagus 08/29/2015     Priority: Medium     Last EGD 2018, no evidence of Barrett's esophagus, no further EGD  recommended    GERD (gastroesophageal reflux disease) 08/29/2015     Priority: Medium    Irritable bowel syndrome with constipation 08/29/2015     Priority: Medium    Lumbosacral spondylosis with radiculopathy 08/29/2015     Priority: Medium    Barrett's esophagus with esophagitis 08/29/2015     Priority: Medium     Formatting of this note might be different from the original. Last EGD 2018, no evidence of Barrett's esophagus, no further EGD recommended    Primary insomnia 10/25/2014     Priority: Medium     Overview:  Last Assessment & Plan:  Insomnia is stable with current regimen of Ambien . Continue current dosage of Ambien .    Seborrheic dermatitis of scalp 12/02/2017     Priority: Low   Allergic rhinitis 10/11/2007     Priority: Low    Qualifier: Diagnosis of  By: Foy Imam CMA, Nettie Barb      Restless leg syndrome 11/16/2023    Obesity, morbid (HCC) 08/16/2023     Social History: Patient  reports that she has never smoked. She has never used smokeless tobacco. She reports current alcohol use of about 1.0 standard drink of alcohol per week. She reports that she does not use drugs.  Review of Systems: Ophthalmic: negative for eye pain, loss of vision or double vision Cardiovascular: negative for chest pain Respiratory: negative for SOB or persistent cough Gastrointestinal: negative for abdominal pain Genitourinary: negative for dysuria or gross hematuria MSK: negative for foot lesions Neurologic: negative for weakness or gait disturbance  Objective  Vitals: BP (!) 112/52   Pulse 73   Temp 97.7 F (36.5 C)   Ht 5\' 3"  (1.6 m)   Wt 212 lb (96.2 kg)   SpO2 96%   BMI 37.55 kg/m  General: well appearing, no acute distress  Psych:  Alert and oriented, normal mood and affect HEENT:  Normocephalic, atraumatic, moist mucous membranes, supple neck  Cardiovascular:  Nl S1 and S2, RRR without murmur, gallop or rub. no edema Respiratory:  Good breath sounds bilaterally, CTAB  with normal effort, no rales  Diabetic education: ongoing education regarding chronic disease management for diabetes was given today. We continue to reinforce the ABC's of diabetic management: A1c (<7 or 8 dependent upon patient), tight blood pressure control, and cholesterol management with goal LDL < 100 minimally. We discuss diet strategies, exercise recommendations, medication options and possible side effects. At each visit, we review recommended immunizations and preventive care recommendations for diabetics and stress  that good diabetic control can prevent other problems. See below for this patient's data. Commons side effects, risks, benefits, and alternatives for medications and treatment plan prescribed today were discussed, and the patient expressed understanding of the given instructions. Patient is instructed to call or message via MyChart if he/she has any questions or concerns regarding our treatment plan. No barriers to understanding were identified. We discussed Red Flag symptoms and signs in detail. Patient expressed understanding regarding what to do in case of urgent or emergency type symptoms.  Medication list was reconciled, printed and provided to the patient in AVS. Patient instructions and summary information was reviewed with the patient as documented in the AVS. This note was prepared with assistance of Dragon voice recognition software. Occasional wrong-word or sound-a-like substitutions may have occurred due to the inherent limitations of voice recognition software

## 2023-11-19 ENCOUNTER — Other Ambulatory Visit: Payer: Self-pay | Admitting: Family Medicine

## 2023-11-22 DIAGNOSIS — Z7984 Long term (current) use of oral hypoglycemic drugs: Secondary | ICD-10-CM | POA: Diagnosis not present

## 2023-11-22 DIAGNOSIS — W010XXA Fall on same level from slipping, tripping and stumbling without subsequent striking against object, initial encounter: Secondary | ICD-10-CM | POA: Diagnosis not present

## 2023-11-22 DIAGNOSIS — S50311A Abrasion of right elbow, initial encounter: Secondary | ICD-10-CM | POA: Diagnosis not present

## 2023-11-22 DIAGNOSIS — S80211A Abrasion, right knee, initial encounter: Secondary | ICD-10-CM | POA: Diagnosis not present

## 2023-11-22 DIAGNOSIS — E785 Hyperlipidemia, unspecified: Secondary | ICD-10-CM | POA: Diagnosis not present

## 2023-11-22 DIAGNOSIS — Y929 Unspecified place or not applicable: Secondary | ICD-10-CM | POA: Diagnosis not present

## 2023-11-22 DIAGNOSIS — Y9301 Activity, walking, marching and hiking: Secondary | ICD-10-CM | POA: Diagnosis not present

## 2023-11-22 DIAGNOSIS — I10 Essential (primary) hypertension: Secondary | ICD-10-CM | POA: Diagnosis not present

## 2023-11-22 DIAGNOSIS — Z79899 Other long term (current) drug therapy: Secondary | ICD-10-CM | POA: Diagnosis not present

## 2023-12-18 ENCOUNTER — Other Ambulatory Visit: Payer: Self-pay | Admitting: Family Medicine

## 2024-02-21 ENCOUNTER — Other Ambulatory Visit: Payer: Self-pay | Admitting: Family Medicine

## 2024-03-11 ENCOUNTER — Other Ambulatory Visit: Payer: Self-pay | Admitting: Family Medicine

## 2024-03-17 ENCOUNTER — Other Ambulatory Visit: Payer: Self-pay | Admitting: Family Medicine

## 2024-03-17 DIAGNOSIS — L409 Psoriasis, unspecified: Secondary | ICD-10-CM

## 2024-04-17 ENCOUNTER — Ambulatory Visit: Payer: Self-pay

## 2024-04-17 ENCOUNTER — Ambulatory Visit: Payer: Self-pay | Admitting: Family Medicine

## 2024-04-17 ENCOUNTER — Encounter: Payer: Self-pay | Admitting: Family Medicine

## 2024-04-17 ENCOUNTER — Ambulatory Visit (INDEPENDENT_AMBULATORY_CARE_PROVIDER_SITE_OTHER): Admitting: Family Medicine

## 2024-04-17 VITALS — BP 118/70 | HR 71 | Temp 97.7°F | Ht 63.0 in | Wt 214.0 lb

## 2024-04-17 DIAGNOSIS — E1142 Type 2 diabetes mellitus with diabetic polyneuropathy: Secondary | ICD-10-CM

## 2024-04-17 DIAGNOSIS — L03011 Cellulitis of right finger: Secondary | ICD-10-CM

## 2024-04-17 LAB — CBC WITH DIFFERENTIAL/PLATELET
Basophils Absolute: 0 K/uL (ref 0.0–0.1)
Basophils Relative: 0.3 % (ref 0.0–3.0)
Eosinophils Absolute: 0.1 K/uL (ref 0.0–0.7)
Eosinophils Relative: 2.1 % (ref 0.0–5.0)
HCT: 36 % (ref 36.0–46.0)
Hemoglobin: 11.5 g/dL — ABNORMAL LOW (ref 12.0–15.0)
Lymphocytes Relative: 15.4 % (ref 12.0–46.0)
Lymphs Abs: 1 K/uL (ref 0.7–4.0)
MCHC: 31.9 g/dL (ref 30.0–36.0)
MCV: 84 fl (ref 78.0–100.0)
Monocytes Absolute: 0.5 K/uL (ref 0.1–1.0)
Monocytes Relative: 7.3 % (ref 3.0–12.0)
Neutro Abs: 5.1 K/uL (ref 1.4–7.7)
Neutrophils Relative %: 74.9 % (ref 43.0–77.0)
Platelets: 171 K/uL (ref 150.0–400.0)
RBC: 4.28 Mil/uL (ref 3.87–5.11)
RDW: 16 % — ABNORMAL HIGH (ref 11.5–15.5)
WBC: 6.7 K/uL (ref 4.0–10.5)

## 2024-04-17 MED ORDER — TRAMADOL HCL 50 MG PO TABS: 50.0000 mg | ORAL_TABLET | Freq: Two times a day (BID) | ORAL | 2 refills | Status: DC | PRN

## 2024-04-17 MED ORDER — SULFAMETHOXAZOLE-TRIMETHOPRIM 800-160 MG PO TABS: 1.0000 | ORAL_TABLET | Freq: Two times a day (BID) | ORAL | 0 refills | Status: DC

## 2024-04-17 MED ORDER — CEFTRIAXONE SODIUM 500 MG IJ SOLR
500.0000 mg | Freq: Once | INTRAMUSCULAR | Status: AC
  Administered 2024-04-17: 500 mg via INTRAMUSCULAR

## 2024-04-17 NOTE — Patient Instructions (Signed)
 Please follow up as scheduled for your next visit with me: 05/17/2024   If you have any questions or concerns, please don't hesitate to send me a message via MyChart or call the office at [phone]. Thank you for visiting with us  today! It's our pleasure caring for you.    VISIT SUMMARY: During your visit, we discussed the infection in your hand that started after a thorn injury from a rose bush. The infection has worsened over the past few days, causing significant pain. We also talked about managing your pain more effectively.  YOUR PLAN: -CELLULITIS OF HAND AND FINGERS: Cellulitis is a bacterial infection of the skin and tissues beneath it. It can spread quickly and requires prompt treatment. We will start you on oral antibiotics to fight the infection. Additionally, we will do a blood test to check your current health status. Depending on the results, you may need daily antibiotic injections. If the oral antibiotics do not work, we might need to use IV antibiotics.  -PAIN OF HAND AND FINGERS: You are experiencing significant pain in your hand and fingers. We will prescribe tramadol  to help manage your pain more effectively.  INSTRUCTIONS: Please take the prescribed oral antibiotics as directed. We will also need you to get a complete blood count test done. Based on the results, we may need to adjust your treatment plan, including the possibility of daily antibiotic injections or IV antibiotics. Additionally, take tramadol  as prescribed for pain management. Follow up with us  if your symptoms do not improve or if they worsen.                      Contains text generated by [employer]bridge.                                 Contains text generated by [employer]bridge.

## 2024-04-17 NOTE — Telephone Encounter (Signed)
 Appt today

## 2024-04-17 NOTE — Progress Notes (Signed)
 Subjective  CC:  Chief Complaint  Patient presents with   swollen finger    Pt has a rt middle swollen finger since last Saturday. Pt stated that she was doing yard work(trimming rose bush) and think it happened then    HPI: Miranda Mathis is a 73 y.o. female who presents to the office today to address the problems listed above in the chief complaint. Discussed the use of AI scribe software for clinical note transcription with the patient, who gave verbal consent to proceed.  History of Present Illness Miranda Mathis is a 73 year old female who presents with a soft tissue infection of the hand following a thorn injury.  Soft tissue infection of the hand - Right middle finger with redness pain and swelling onset about a week ago.  She was working in the yard and noticed a puncture wound near the nailbed. - Initial expectation of spontaneous resolution, but symptoms have progressively worsened over the past three days - Infection has spread through the entire finger - Her husband tried to drain the initial wound area last week with a little bit of pus removed however symptoms have continued to progress - Associated with significant pain, described as 'driving' pain at 90 out of 100 - No fever  Pain management - Currently taking acetaminophen  1500 mg in the morning and 1500 mg at night - Acetaminophen  provides psychological relief - Inquires about the potential use of tramadol  for pain control  Diabetic: No hypoglycemic symptoms during this infection at this time. Assessment  1. Cellulitis of finger of right hand   2. DM type 2 with diabetic peripheral neuropathy (HCC)      Plan  Assessment and Plan Assessment & Plan Cellulitis of hand and fingers Cellulitis of the hand and fingers, likely secondary to a thorn injury from a rose bush. Symptoms have been present for approximately one week, with worsening over the past three days. No fever reported. The infection is spreading through  the fingers, indicating a need for aggressive treatment to prevent further complications. -Rocephin  500 mg IM injection in the office given to start treatment. - Prescribe oral antibiotics.  Septra DS 1 p.o. twice daily to cover for possible MRSA.  Will need to consider abdominal infection [place] she was working with rosebushes.  Will monitor closely. - Order complete blood count to assess current status. - Check CBC - Discuss potential need for IV antibiotics if current treatment is ineffective.  Pain of hand and fingers Significant pain in the hand and fingers, described as a driving pain. She has been taking 1500 mg of Tylenol  twice daily, which provides psychological relief but not significant pain control. - Prescribe tramadol  for pain management.    Follow up: 48-72 hrs. if not improving.  Monitor for fevers and hyperglycemia Orders Placed This Encounter  Procedures   CBC with Differential/Platelet   Meds ordered this encounter  Medications   traMADol  (ULTRAM ) 50 MG tablet    Sig: Take 1 tablet (50 mg total) by mouth 2 (two) times daily as needed for moderate pain (pain score 4-6).    Dispense:  30 tablet    Refill:  2   sulfamethoxazole -trimethoprim  (BACTRIM DS) 800-160 MG tablet    Sig: Take 1 tablet by mouth 2 (two) times daily.    Dispense:  14 tablet    Refill:  0   cefTRIAXone  (ROCEPHIN ) injection 500 mg     I reviewed the patients updated PMH, FH, and SocHx.  Patient Active Problem List   Diagnosis Date Noted   Stage 3a chronic kidney disease (HCC) 05/13/2023    Priority: High   Tubular adenoma of colon 01/26/2023    Priority: High   Depression, major, single episode, moderate (HCC) 07/11/2020    Priority: High   DM type 2 with diabetic peripheral neuropathy (HCC) 09/02/2016    Priority: High   Recurrent kidney stones 08/29/2015    Priority: High   GAD (generalized anxiety disorder) 08/29/2015    Priority: High   Acquired hypothyroidism 11/02/2013     Priority: High   Mixed hyperlipidemia 04/04/2012    Priority: High   Fibromyalgia 10/11/2007    Priority: High   Chronic low back pain 10/11/2007    Priority: High   Chronic narcotic use 07/28/2022    Priority: Medium    Lower extremity edema 04/15/2021    Priority: Medium    Osteopenia after menopause 06/06/2019    Priority: Medium    Bilateral hydronephrosis     Priority: Medium    Degenerative scoliosis 09/07/2018    Priority: Medium    Psoriasis 12/02/2017    Priority: Medium    History of Barrett's esophagus 08/29/2015    Priority: Medium    GERD (gastroesophageal reflux disease) 08/29/2015    Priority: Medium    Irritable bowel syndrome with constipation 08/29/2015    Priority: Medium    Lumbosacral spondylosis with radiculopathy 08/29/2015    Priority: Medium    Barrett's esophagus with esophagitis 08/29/2015    Priority: Medium    Primary insomnia 10/25/2014    Priority: Medium    Seborrheic dermatitis of scalp 12/02/2017    Priority: Low   Allergic rhinitis 10/11/2007    Priority: Low   Restless leg syndrome 11/16/2023   Obesity, morbid (HCC) 08/16/2023   Current Meds  Medication Sig   Cholecalciferol  (VITAMIN D ) 50 MCG (2000 UT) tablet Take 2,000 Units by mouth daily.    clobetasol  (TEMOVATE ) 0.05 % external solution APPLY 1 APPLICATION TOPICALLY 2 (TWO) TIMES DAILY. SCALP   clotrimazole -betamethasone  (LOTRISONE ) cream APPLY 1 APPLICATION TOPICALLY DAILY   docusate sodium (COLACE) 50 MG capsule Take 50 mg by mouth 2 (two) times daily.   furosemide  (LASIX ) 20 MG tablet TAKE 1 TABLET (20 MG TOTAL) BY MOUTH DAILY AS NEEDED FOR EDEMA. TAKE WITH POTASSIUM   gabapentin  (NEURONTIN ) 300 MG capsule TAKE 2 CAPSULES BY MOUTH 3 TIMES A DAY   glipiZIDE  (GLUCOTROL  XL) 5 MG 24 hr tablet Take 1 tablet (5 mg total) by mouth daily with breakfast.   KLOR-CON  M20 20 MEQ tablet TAKE 1 TABLET (20 MEQ TOTAL) BY MOUTH DAILY AS NEEDED (LEG SWELLING). TAKE WITH FUROSEMIDE     levothyroxine  (SYNTHROID ) 200 MCG tablet TAKE 1 TABLET BY MOUTH EVERY DAY   levothyroxine  (SYNTHROID ) 25 MCG tablet TAKE 1 TABLET BY MOUTH EVERY DAY   lisinopril  (ZESTRIL ) 5 MG tablet TAKE 1 TABLET (5 MG TOTAL) BY MOUTH DAILY.   meclizine  (ANTIVERT ) 12.5 MG tablet Take 1 tablet (12.5 mg total) by mouth 3 (three) times daily as needed for dizziness (May cause drowsiness.).   meloxicam  (MOBIC ) 7.5 MG tablet TAKE 1 TABLET BY MOUTH EVERY DAY AS NEEDED FOR PAIN   metFORMIN  (GLUCOPHAGE ) 1000 MG tablet TAKE 1 TABLET (1,000 MG TOTAL) BY MOUTH TWICE A DAY WITH FOOD   omeprazole  (PRILOSEC) 40 MG capsule TAKE 1 CAPSULE BY MOUTH EVERY DAY   ondansetron  (ZOFRAN -ODT) 4 MG disintegrating tablet Take 1 tablet (4 mg total) by mouth every  8 (eight) hours as needed for nausea or vomiting.   rOPINIRole  (REQUIP ) 2 MG tablet Take 1 tablet (2 mg total) by mouth at bedtime.   rosuvastatin  (CRESTOR ) 40 MG tablet TAKE 1 TABLET BY MOUTH EVERY DAY   sertraline  (ZOLOFT ) 100 MG tablet TAKE 2 TABLETS BY MOUTH EVERY DAY   sulfamethoxazole -trimethoprim  (BACTRIM DS) 800-160 MG tablet Take 1 tablet by mouth 2 (two) times daily.   zolpidem  (AMBIEN ) 10 MG tablet TAKE 1 TABLET BY MOUTH AT BEDTIME AS NEEDED FOR SLEEP   [DISCONTINUED] traMADol  (ULTRAM ) 50 MG tablet Take 1 tablet (50 mg total) by mouth 2 (two) times daily as needed for moderate pain (pain score 4-6).   Allergies: Patient is allergic to ketorolac . Family History: Patient family history includes Arthritis in her brother, daughter, father, maternal grandmother, and mother; Asthma in her daughter; Birth defects in her daughter and father; COPD in her mother; Cancer in her father; Coronary artery disease in her paternal grandfather; Depression in her daughter; Diabetes in her paternal aunt; Emphysema in her mother; Hearing loss in her daughter; Hyperlipidemia in her brother; Hypertension in her father and mother; Kidney Stones in her daughter; Mental retardation in her  daughter; Osteoarthritis in her mother; Prostate cancer in her father; Rheum arthritis in her mother. Social History:  Patient  reports that she has never smoked. She has never used smokeless tobacco. She reports current alcohol use of about 1.0 standard drink of alcohol per week. She reports that she does not use drugs.  Review of Systems: Constitutional: Negative for fever malaise or anorexia Cardiovascular: negative for chest pain Respiratory: negative for SOB or persistent cough Gastrointestinal: negative for abdominal pain  Objective  Vitals: BP 118/70   Pulse 71   Temp 97.7 F (36.5 C)   Ht 5' 3 (1.6 m)   Wt 214 lb (97.1 kg)   SpO2 95%   BMI 37.91 kg/m  General: no acute distress , A&Ox3 Right middle finger is red, swollen and tender.  Hand is otherwise normal.  There is a puncture wound dorsal finger.  No fluctuance Commons side effects, risks, benefits, and alternatives for medications and treatment plan prescribed today were discussed, and the patient expressed understanding of the given instructions. Patient is instructed to call or message via MyChart if he/she has any questions or concerns regarding our treatment plan. No barriers to understanding were identified. We discussed Red Flag symptoms and signs in detail. Patient expressed understanding regarding what to do in case of urgent or emergency type symptoms.  Medication list was reconciled, printed and provided to the patient in AVS. Patient instructions and summary information was reviewed with the patient as documented in the AVS. This note was prepared with assistance of Dragon voice recognition software. Occasional wrong-word or sound-a-like substitutions may have occurred due to the inherent limitations of voice recognition software

## 2024-04-17 NOTE — Telephone Encounter (Signed)
 FYI Only or Action Required?: FYI only for provider.  Patient was last seen in primary care on 11/16/2023 by [name], MD.  Called Nurse Triage reporting Hand Pain and Edema.  Symptoms began a week ago.  Interventions attempted: OTC medications: Tylenol .  Symptoms are: right middle finger swelling (started at single knuckle and now spread down into right hand) with redness and pain gradually worsening.  Triage Disposition: See Physician Within 24 Hours  Patient/caregiver understands and will follow disposition?: Yes            Copied from CRM #[id number]. Topic: Clinical - Red Word Triage >> Apr 17, 2024  8:08 AM Anairis L wrote: [facility] that prompted transfer to Nurse Triage: Right swollen finger, going down to hand.Very very painful. Reason for Disposition  MODERATE hand swelling (e.g., visible swelling of hand and fingers; pitting edema)  Answer Assessment - Initial Assessment Questions 1. ONSET: When did the swelling start? (e.g., minutes, hours, days)     1 week ago.  2. LOCATION: What part of the hand is swollen?  Are both hands swollen or just one hand?     Right middle finger.  3. TYPE : What does it look like? (e.g., ball, lump; localized; hand swelling)     Finger swelling.  4. SWELLING SEVERITY: If more than a lump or localized, ask: How bad is the hand swelling? (e.g., mild, moderate, severe; describe)     Mild to moderate goes into the knuckles of right hand. She states she has a hard time using or bending her middle finger.  5. REDNESS: Is there redness or signs of infection?     Slight redness to middle finger, on top side of finger.  6. PAIN: Is the swelling painful to touch? If Yes, ask: How painful is it?   (Scale 1-10; mild, moderate or severe)     9/10.  7. FEVER: Do you have a fever? If Yes, ask: What is it, how was it measured, and when did it start?      No.  8. CAUSE: What do you think is causing the hand  swelling? (e.g., heat, insect bite, pregnancy, recent injury)     Unsure.  9. MEDICAL HISTORY: Do you have a history of heart failure, kidney disease, liver failure, or cancer?     No.  10. RECURRENT SYMPTOM: Have you had hand swelling before? If Yes, ask: When was the last time? What happened that time?       Nope.  11. OTHER SYMPTOMS: Do you have any other symptoms? (e.g., blurred vision, difficulty breathing, headache)       Wart on the side of middle finger. Denies difficulty breathing, headaches.  Protocols used: Hand Swelling-A-AH

## 2024-04-17 NOTE — Progress Notes (Signed)
 See mychart note Dear Ms. Freundlich, Your WBC is normal which is good news; and you have a mild anemia. I hope the antibiotics help quickly. Sincerely, Dr. Jodie

## 2024-04-27 DIAGNOSIS — E119 Type 2 diabetes mellitus without complications: Secondary | ICD-10-CM | POA: Diagnosis not present

## 2024-04-27 DIAGNOSIS — H2513 Age-related nuclear cataract, bilateral: Secondary | ICD-10-CM | POA: Diagnosis not present

## 2024-04-27 DIAGNOSIS — H524 Presbyopia: Secondary | ICD-10-CM | POA: Diagnosis not present

## 2024-04-27 DIAGNOSIS — Z0100 Encounter for examination of eyes and vision without abnormal findings: Secondary | ICD-10-CM | POA: Diagnosis not present

## 2024-04-27 LAB — OPHTHALMOLOGY REPORT-SCANNED

## 2024-05-11 ENCOUNTER — Telehealth: Payer: Self-pay

## 2024-05-11 NOTE — Telephone Encounter (Signed)
 Spke with pt and she stated that she will go to [facility]

## 2024-05-11 NOTE — Telephone Encounter (Signed)
 Copied from CRM #[id number]. Topic: Clinical - Medical Advice >> May 11, 2024 10:57 AM Viola F wrote: Reason for CRM: Patient says her finger is still infected, it's red, swollen, painful, and pus is coming out. Dr. Jodie is booking into December - she refused nurse triage and an appt with another provider. She wants to know what to do about her finger? Please call her at [phone] (M)  Please Advise

## 2024-05-17 ENCOUNTER — Ambulatory Visit (INDEPENDENT_AMBULATORY_CARE_PROVIDER_SITE_OTHER): Admitting: Family Medicine

## 2024-05-17 ENCOUNTER — Encounter: Payer: Self-pay | Admitting: Family Medicine

## 2024-05-17 VITALS — BP 136/74 | HR 72 | Temp 98.1°F | Ht 63.0 in | Wt 209.2 lb

## 2024-05-17 DIAGNOSIS — N1831 Chronic kidney disease, stage 3a: Secondary | ICD-10-CM

## 2024-05-17 DIAGNOSIS — E1142 Type 2 diabetes mellitus with diabetic polyneuropathy: Secondary | ICD-10-CM

## 2024-05-17 DIAGNOSIS — E119 Type 2 diabetes mellitus without complications: Secondary | ICD-10-CM

## 2024-05-17 DIAGNOSIS — E782 Mixed hyperlipidemia: Secondary | ICD-10-CM

## 2024-05-17 DIAGNOSIS — L03011 Cellulitis of right finger: Secondary | ICD-10-CM

## 2024-05-17 DIAGNOSIS — Z9181 History of falling: Secondary | ICD-10-CM

## 2024-05-17 DIAGNOSIS — E039 Hypothyroidism, unspecified: Secondary | ICD-10-CM

## 2024-05-17 DIAGNOSIS — M170 Bilateral primary osteoarthritis of knee: Secondary | ICD-10-CM | POA: Insufficient documentation

## 2024-05-17 DIAGNOSIS — F5101 Primary insomnia: Secondary | ICD-10-CM

## 2024-05-17 LAB — LIPID PANEL
Cholesterol: 109 mg/dL (ref 0–200)
HDL: 34.9 mg/dL — ABNORMAL LOW (ref >39.00)
LDL Cholesterol: 52 mg/dL (ref 0–99)
NonHDL: 74.4
Total CHOL/HDL Ratio: 3
Triglycerides: 111 mg/dL (ref 0.0–149.0)
VLDL: 22.2 mg/dL (ref 0.0–40.0)

## 2024-05-17 LAB — CBC WITH DIFFERENTIAL/PLATELET
Basophils Absolute: 0 K/uL (ref 0.0–0.1)
Basophils Relative: 0.4 % (ref 0.0–3.0)
Eosinophils Absolute: 0.2 K/uL (ref 0.0–0.7)
Eosinophils Relative: 4.5 % (ref 0.0–5.0)
HCT: 35.6 % — ABNORMAL LOW (ref 36.0–46.0)
Hemoglobin: 11.5 g/dL — ABNORMAL LOW (ref 12.0–15.0)
Lymphocytes Relative: 20.1 % (ref 12.0–46.0)
Lymphs Abs: 0.9 K/uL (ref 0.7–4.0)
MCHC: 32.4 g/dL (ref 30.0–36.0)
MCV: 83.6 fl (ref 78.0–100.0)
Monocytes Absolute: 0.3 K/uL (ref 0.1–1.0)
Monocytes Relative: 7.1 % (ref 3.0–12.0)
Neutro Abs: 3.2 K/uL (ref 1.4–7.7)
Neutrophils Relative %: 67.9 % (ref 43.0–77.0)
Platelets: 164 K/uL (ref 150.0–400.0)
RBC: 4.26 Mil/uL (ref 3.87–5.11)
RDW: 15.9 % — ABNORMAL HIGH (ref 11.5–15.5)
WBC: 4.7 K/uL (ref 4.0–10.5)

## 2024-05-17 LAB — COMPREHENSIVE METABOLIC PANEL WITH GFR
ALT: 36 U/L — ABNORMAL HIGH (ref 0–35)
AST: 62 U/L — ABNORMAL HIGH (ref 0–37)
Albumin: 4 g/dL (ref 3.5–5.2)
Alkaline Phosphatase: 61 U/L (ref 39–117)
BUN: 15 mg/dL (ref 6–23)
CO2: 25 meq/L (ref 19–32)
Calcium: 9.3 mg/dL (ref 8.4–10.5)
Chloride: 108 meq/L (ref 96–112)
Creatinine, Ser: 1.07 mg/dL (ref 0.40–1.20)
GFR: 51.36 mL/min — ABNORMAL LOW (ref >60.00)
Glucose, Bld: 100 mg/dL — ABNORMAL HIGH (ref 70–99)
Potassium: 4.8 meq/L (ref 3.5–5.1)
Sodium: 142 meq/L (ref 135–145)
Total Bilirubin: 0.5 mg/dL (ref 0.2–1.2)
Total Protein: 6.5 g/dL (ref 6.0–8.3)

## 2024-05-17 LAB — MICROALBUMIN / CREATININE URINE RATIO
Creatinine,U: 179.6 mg/dL
Microalb Creat Ratio: 11.2 mg/g (ref 0.0–30.0)
Microalb, Ur: 2 mg/dL — ABNORMAL HIGH (ref 0.0–1.9)

## 2024-05-17 LAB — POCT GLYCOSYLATED HEMOGLOBIN (HGB A1C): Hemoglobin A1C: 6.2 % — AB (ref 4.0–5.6)

## 2024-05-17 LAB — HEMOGLOBIN A1C: Hgb A1c MFr Bld: 6.2 % (ref 4.6–6.5)

## 2024-05-17 LAB — TSH: TSH: 1.06 u[IU]/mL (ref 0.35–5.50)

## 2024-05-17 MED ORDER — ZOLPIDEM TARTRATE 10 MG PO TABS: 10.0000 mg | ORAL_TABLET | Freq: Every evening | ORAL | 5 refills | Status: AC | PRN

## 2024-05-17 MED ORDER — SULFAMETHOXAZOLE-TRIMETHOPRIM 800-160 MG PO TABS: 1.0000 | ORAL_TABLET | Freq: Two times a day (BID) | ORAL | 0 refills | Status: AC

## 2024-05-17 NOTE — Patient Instructions (Signed)
 Please return in 6 months to recheck diabetes and recheck  I will release your lab results to you on your MyChart account with further instructions. You may see the results before I do, but when I review them I will send you a message with my report or have my assistant call you if things need to be discussed. Please reply to my message with any questions. Thank you!   If you have any questions or concerns, please don't hesitate to send me a message via MyChart or call the office at [phone]. Thank you for visiting with us  today! It's our pleasure caring for you.

## 2024-05-17 NOTE — Progress Notes (Signed)
 Subjective  CC:  Chief Complaint  Patient presents with   Diabetes    HPI: Miranda Mathis is a 73 y.o. female who presents to the office today for follow up of diabetes and problems listed above in the chief complaint.  Discussed the use of AI scribe software for clinical note transcription with the patient, who gave verbal consent to proceed.  History of Present Illness Miranda Mathis is a 73 year old female who presents with a knuckle infection and recent fall.  Paronychia - Knuckle infection present for approximately one week - Previously characterized by pus and tunneling - Currently on antibiotics - Continues warm soaks with Epsom salt  Recent fall and associated symptoms - Fall occurred approximately two to three weeks ago - Experienced dizziness and momentary 'blanking out' prior to fall - No loss of consciousness - No recollection of the actual fall - Sustained bruising on hip after falling against fireplace - Unable to get up after fall due to knee issues; required assistance to a chair  Knee pain and impaired mobility - Significant knee problems limiting mobility - Difficulty walking any distance - Currently under care of provider [facility] for knee issues, reports endstage - needs a rollator: difficulty walking long distances. Impedes her ability to grocery shop etc.  - not ready for knee replacement  Blood pressure monitoring - Home blood pressure readings range from 136 to 140 mmHg - Uses a home blood pressure device ordered by her daughter - Home readings consistent with office measurements    Wt Readings from Last 3 Encounters:  05/17/24 209 lb 3.2 oz (94.9 kg)  04/17/24 214 lb (97.1 kg)  11/16/23 212 lb (96.2 kg)    BP Readings from Last 3 Encounters:  05/17/24 136/74  04/17/24 118/70  11/16/23 (!) 112/52    Assessment  1. DM type 2 with diabetic peripheral neuropathy (HCC)   2. Acquired hypothyroidism   3. Mixed hyperlipidemia   4. Stage 3a chronic  kidney disease (HCC)   5. Diabetes mellitus treated with oral medication (HCC)   6. Primary insomnia      Plan  Assessment and Plan Assessment & Plan Infection and arthritis of right hand knuckle The infection in the right hand knuckle is improving with reduced pus and tunneling. Arthritis at the joint contributes to pain. The immune response is effective, but further intervention is needed. - Prescribed another round of antibiotics - Continue warm soaks with Epsom salt  Bilateral knee osteoarthritis Causes significant mobility issues, requiring assistance for long distances. Surgery is planned for both knees. - Ordered a rolling walker for mobility assistance  Type 2 diabetes mellitus with diabetic polyneuropathy_ recheck levels.   HTN:  Blood pressure readings are slightly elevated, ranging from 136 to 140 mmHg. Home blood pressure monitor readings align with clinic measurements.  HLD: recheck labs  Follow up: 6 mo for recheck Orders Placed This Encounter  Procedures   CBC with Differential/Platelet   Comprehensive metabolic panel with GFR   Lipid panel   Hemoglobin A1c   TSH   Microalbumin / creatinine urine ratio   POCT HgB A1C   Meds ordered this encounter  Medications   sulfamethoxazole -trimethoprim  (BACTRIM  DS) 800-160 MG tablet    Sig: Take 1 tablet by mouth 2 (two) times daily.    Dispense:  14 tablet    Refill:  0   zolpidem  (AMBIEN ) 10 MG tablet    Sig: Take 1 tablet (10 mg total) by mouth at  bedtime as needed. for sleep    Dispense:  30 tablet    Refill:  5    This request is for a new prescription for a controlled substance as required by Federal/State law.      Immunization History  Administered Date(s) Administered   Fluad Quad(high Dose 65+) 04/13/2019, 04/04/2020, 04/15/2021, 04/08/2022   Fluad Trivalent(High Dose 65+) 05/09/2023   H1N1 03/22/2021   INFLUENZA, HIGH DOSE SEASONAL PF 02/19/2016, 06/02/2018, 04/08/2022, 05/09/2023   Influenza Whole  03/22/2001, 03/22/2006, 03/22/2010   Influenza, Seasonal, Injecte, Preservative Fre 06/27/2012   Influenza-Unspecified 06/27/2012, 04/13/2019   Moderna Sars-Covid-2 Vaccination 08/14/2019   PFIZER(Purple Top)SARS-COV-2 Vaccination 07/17/2019, 07/24/2019, 05/08/2020   Pfizer Covid-19 Vaccine Bivalent Booster 19yrs & up 04/08/2022, 05/09/2023   Pfizer(Comirnaty)Fall Seasonal Vaccine 12 years and older 04/08/2022, 05/09/2023, 03/20/2024   Pneumococcal Conjugate-13 11/27/2015   Pneumococcal Polysaccharide-23 11/13/2010, 07/16/2021   Respiratory Syncytial Virus Vaccine,Recomb Aduvanted(Arexvy) 05/23/2023   Tdap 11/13/2010, 09/14/2020   Zoster Recombinant(Shingrix ) 04/13/2019   Zoster, Live 11/27/2015    Diabetes Related Lab Review: Lab Results  Component Value Date   HGBA1C 5.9 (A) 11/16/2023   HGBA1C 7.0 (H) 08/16/2023   HGBA1C 6.3 (A) 08/16/2023    Lab Results  Component Value Date   MICROALBUR 1.4 08/16/2023   Lab Results  Component Value Date   CREATININE 1.08 08/16/2023   BUN 16 08/16/2023   NA 139 08/16/2023   K 3.6 08/16/2023   CL 102 08/16/2023   CO2 24 08/16/2023   Lab Results  Component Value Date   CHOL 121 08/16/2023   CHOL 105 07/28/2022   CHOL 122 07/16/2021   Lab Results  Component Value Date   HDL 40.20 08/16/2023   HDL 38.70 (L) 07/28/2022   HDL 36.70 (L) 07/16/2021   Lab Results  Component Value Date   LDLCALC 58 08/16/2023   LDLCALC 46 07/28/2022   LDLCALC 64 07/16/2021   Lab Results  Component Value Date   TRIG 113.0 08/16/2023   TRIG 102.0 07/28/2022   TRIG 108.0 07/16/2021   Lab Results  Component Value Date   CHOLHDL 3 08/16/2023   CHOLHDL 3 07/28/2022   CHOLHDL 3 07/16/2021   Lab Results  Component Value Date   LDLDIRECT 165.3 11/13/2010   LDLDIRECT 143.5 08/30/2008   The ASCVD Risk score (Arnett DK, et al., 2019) failed to calculate for the following reasons:   The valid total cholesterol range is 130 to 320 mg/dL I have  reviewed the PMH, Fam and Soc history. Patient Active Problem List   Diagnosis Date Noted   Stage 3a chronic kidney disease (HCC) 05/13/2023    Priority: High   Tubular adenoma of colon 01/26/2023    Priority: High    Dr. Avram, colonoscopy 2021. Repeat in 3 years.    Depression, major, single episode, moderate (HCC) 07/11/2020    Priority: High   DM type 2 with diabetic peripheral neuropathy (HCC) 09/02/2016    Priority: High    Diagnosed 2019; started meds 2020; + microalbunuria on ace, met 1000 bid Added Gluctotrol xl 5mg  11.2024 (farxiga  not covered, glp-1 expensive)    Recurrent kidney stones 08/29/2015    Priority: High    Overview:  S/p stents, multiple, [facility], Dr. Essie    GAD (generalized anxiety disorder) 08/29/2015    Priority: High   Acquired hypothyroidism 11/02/2013    Priority: High   Mixed hyperlipidemia 04/04/2012    Priority: High   Fibromyalgia 10/11/2007    Priority: High  Chronic low back pain 10/11/2007    Priority: High   Chronic narcotic use 07/28/2022    Priority: Medium    Lower extremity edema 04/15/2021    Priority: Medium    Osteopenia after menopause 06/06/2019    Priority: Medium     dexa 2019 T = -1.9, multiple site osteopenia;  DEXA December 2022, osteopenia DEXA 07/2023 lowest T = -2.3, osteopenia. Recheck 2 years. Routine conservative mgt discussed.    Bilateral hydronephrosis     Priority: Medium    Degenerative scoliosis 09/07/2018    Priority: Medium    Psoriasis 12/02/2017    Priority: Medium    History of Barrett's esophagus 08/29/2015    Priority: Medium     Last EGD 2018, no evidence of Barrett's esophagus, no further EGD recommended    GERD (gastroesophageal reflux disease) 08/29/2015    Priority: Medium    Irritable bowel syndrome with constipation 08/29/2015    Priority: Medium    Lumbosacral spondylosis with radiculopathy 08/29/2015    Priority: Medium    Barrett's esophagus with esophagitis 08/29/2015     Priority: Medium     Formatting of this note might be different from the original. Last EGD 2018, no evidence of Barrett's esophagus, no further EGD recommended    Primary insomnia 10/25/2014    Priority: Medium     Overview:  Last Assessment & Plan:  Insomnia is stable with current regimen of Ambien . Continue current dosage of Ambien .    Seborrheic dermatitis of scalp 12/02/2017    Priority: Low   Allergic rhinitis 10/11/2007    Priority: Low    Qualifier: Diagnosis of  By: Sherron CMA, Steen      Restless leg syndrome 11/16/2023   Obesity, morbid (HCC) 08/16/2023    Social History: Patient  reports that she has never smoked. She has never used smokeless tobacco. She reports current alcohol use of about 1.0 standard drink of alcohol per week. She reports that she does not use drugs.  Review of Systems: Ophthalmic: negative for eye pain, loss of vision or double vision Cardiovascular: negative for chest pain Respiratory: negative for SOB or persistent cough Gastrointestinal: negative for abdominal pain Genitourinary: negative for dysuria or gross hematuria MSK: negative for foot lesions Neurologic: negative for weakness or gait disturbance  Objective  Vitals: BP 136/74   Pulse 72   Temp 98.1 F (36.7 C)   Ht 5' 3 (1.6 m)   Wt 209 lb 3.2 oz (94.9 kg)   SpO2 95%   BMI 37.06 kg/m  General: well appearing, no acute distress  Psych:  Alert and oriented, normal mood and affect HEENT:  Normocephalic, atraumatic, moist mucous membranes, supple neck  Cardiovascular:  Nl S1 and S2, RRR without murmur, gallop or rub. no edema Respiratory:  Good breath sounds bilaterally, CTAB with normal effort, no rales Gastrointestinal: normal BS, soft, nontender Skin:  Warm, no rashes Neurologic:   Mental status is normal. normal gait Foot exam: no erythema, pallor, or cyanosis visible nl proprioception and sensation to monofilament testing bilaterally, +2 distal pulses  bilaterally  Diabetic education: ongoing education regarding chronic disease management for diabetes was given today. We continue to reinforce the ABC's of diabetic management: A1c (<7 or 8 dependent upon patient), tight blood pressure control, and cholesterol management with goal LDL < 100 minimally. We discuss diet strategies, exercise recommendations, medication options and possible side effects. At each visit, we review recommended immunizations and preventive care recommendations for diabetics and stress that good  diabetic control can prevent other problems. See below for this patient's data. Commons side effects, risks, benefits, and alternatives for medications and treatment plan prescribed today were discussed, and the patient expressed understanding of the given instructions. Patient is instructed to call or message via MyChart if he/she has any questions or concerns regarding our treatment plan. No barriers to understanding were identified. We discussed Red Flag symptoms and signs in detail. Patient expressed understanding regarding what to do in case of urgent or emergency type symptoms.  Medication list was reconciled, printed and provided to the patient in AVS. Patient instructions and summary information was reviewed with the patient as documented in the AVS. This note was prepared with assistance of Dragon voice recognition software. Occasional wrong-word or sound-a-like substitutions may have occurred due to the inherent limitations of voice recognition software

## 2024-05-22 DIAGNOSIS — Z791 Long term (current) use of non-steroidal anti-inflammatories (NSAID): Secondary | ICD-10-CM | POA: Diagnosis not present

## 2024-05-22 DIAGNOSIS — E039 Hypothyroidism, unspecified: Secondary | ICD-10-CM | POA: Diagnosis not present

## 2024-05-22 DIAGNOSIS — E1142 Type 2 diabetes mellitus with diabetic polyneuropathy: Secondary | ICD-10-CM | POA: Diagnosis not present

## 2024-05-22 DIAGNOSIS — I110 Hypertensive heart disease with heart failure: Secondary | ICD-10-CM | POA: Diagnosis not present

## 2024-05-22 DIAGNOSIS — Z9989 Dependence on other enabling machines and devices: Secondary | ICD-10-CM | POA: Diagnosis not present

## 2024-05-22 DIAGNOSIS — E785 Hyperlipidemia, unspecified: Secondary | ICD-10-CM | POA: Diagnosis not present

## 2024-05-22 DIAGNOSIS — M1990 Unspecified osteoarthritis, unspecified site: Secondary | ICD-10-CM | POA: Diagnosis not present

## 2024-05-22 DIAGNOSIS — Z9181 History of falling: Secondary | ICD-10-CM | POA: Diagnosis not present

## 2024-05-22 DIAGNOSIS — L409 Psoriasis, unspecified: Secondary | ICD-10-CM | POA: Diagnosis not present

## 2024-05-22 DIAGNOSIS — G4700 Insomnia, unspecified: Secondary | ICD-10-CM | POA: Diagnosis not present

## 2024-05-22 DIAGNOSIS — G2581 Restless legs syndrome: Secondary | ICD-10-CM | POA: Diagnosis not present

## 2024-05-22 DIAGNOSIS — Z8249 Family history of ischemic heart disease and other diseases of the circulatory system: Secondary | ICD-10-CM | POA: Diagnosis not present

## 2024-05-22 DIAGNOSIS — Z823 Family history of stroke: Secondary | ICD-10-CM | POA: Diagnosis not present

## 2024-05-22 DIAGNOSIS — I509 Heart failure, unspecified: Secondary | ICD-10-CM | POA: Diagnosis not present

## 2024-05-22 DIAGNOSIS — E1136 Type 2 diabetes mellitus with diabetic cataract: Secondary | ICD-10-CM | POA: Diagnosis not present

## 2024-05-22 DIAGNOSIS — F411 Generalized anxiety disorder: Secondary | ICD-10-CM | POA: Diagnosis not present

## 2024-05-22 DIAGNOSIS — F329 Major depressive disorder, single episode, unspecified: Secondary | ICD-10-CM | POA: Diagnosis not present

## 2024-05-22 DIAGNOSIS — E876 Hypokalemia: Secondary | ICD-10-CM | POA: Diagnosis not present

## 2024-05-22 DIAGNOSIS — K219 Gastro-esophageal reflux disease without esophagitis: Secondary | ICD-10-CM | POA: Diagnosis not present

## 2024-05-22 DIAGNOSIS — M8580 Other specified disorders of bone density and structure, unspecified site: Secondary | ICD-10-CM | POA: Diagnosis not present

## 2024-05-22 DIAGNOSIS — K2270 Barrett's esophagus without dysplasia: Secondary | ICD-10-CM | POA: Diagnosis not present

## 2024-05-25 ENCOUNTER — Ambulatory Visit: Payer: Self-pay | Admitting: Family Medicine

## 2024-05-25 NOTE — Progress Notes (Signed)
 See mychart note Dear [name], Your labs are stable.  Your diabetic and cholesterol and thyroid  control are all good Your liver tests are up a bit. From fatty liver.  Your mild anemia persists but you had a GI eval with Dr. Avram earlier this year. We will monitor it for you.  Eat iron rich foods.   Happy holidays.  Sincerely, Dr. Jodie

## 2024-05-28 ENCOUNTER — Encounter: Payer: Self-pay | Admitting: Family Medicine

## 2024-05-30 ENCOUNTER — Ambulatory Visit: Payer: Self-pay

## 2024-05-30 NOTE — Telephone Encounter (Signed)
 FYI Only or Action Required?: Action required by provider: request for appointment. Only wants to see PCP.  Patient was last seen in primary care on 05/17/2024 by [name], MD.  Called Nurse Triage reporting Finger Injury.  Symptoms began about a month ago.  Interventions attempted: Prescription medications: multiple rounds of abx.  Symptoms are: gradually worsening.  Triage Disposition: See Physician Within 24 Hours  Patient/caregiver understands and will follow disposition?: No, wishes to speak with PCP  Copied from CRM #[id number]. Topic: Clinical - Red Word Triage >> May 30, 2024  9:19 AM Rea ORN wrote: Red Word that prompted transfer to Nurse Triage: swollen and red finger, cant bend and is tender Reason for Disposition  Finger joint can't be opened (straightened) or closed (bent) completely  (Note: Injured person should be able to do this without assistance.)  Answer Assessment - Initial Assessment Questions 1. MECHANISM: How did the injury happen?      Possibly pruning some roses and got stuck 2. ONSET: When did the injury happen? (e.g., minutes, hours ago)      Pt states that it has been ongoing for atleast a month, pt states that she has been seen by PCP and given abx 3. LOCATION: What part of the finger is injured? Is the nail damaged?      3rd digit R 4. APPEARANCE of the INJURY: What does the injury look like?      Red, swollen 5. SEVERITY: Can you use the hand normally?  Can you bend your fingers into a ball and then fully open them?     Cannot bend finger 7. PAIN: Is there pain? If Yes, ask: How bad is the pain?  (Scale 0-10; or none, mild, moderate, severe)     8 9. OTHER SYMPTOMS: Do you have any other symptoms?    Denies  Routing to clinic for scheduling, pt only wants to see her PCP and no appts are available until 12/17  Protocols used: Finger Injury-A-AH

## 2024-05-30 NOTE — Telephone Encounter (Signed)
 Reviewed

## 2024-05-30 NOTE — Telephone Encounter (Signed)
 Noted pt scheduled for appt 05/31/24

## 2024-05-30 NOTE — Telephone Encounter (Signed)
 Unable to LVM due to VM was not set up. Sent Mychart msg to schedule an ov.

## 2024-05-30 NOTE — Telephone Encounter (Unsigned)
 Copied from CRM [id number]. Topic: Clinical - Red Word Triage >> May 30, 2024  9:19 AM Rea ORN wrote: Red Word that prompted transfer to Nurse Triage: swollen and red finger, cant bend and is tender

## 2024-05-30 NOTE — Telephone Encounter (Signed)
 Please call patient for appointment with PCP if possible.

## 2024-05-30 NOTE — Telephone Encounter (Signed)
 FYI Only or Action Required?: FYI only for provider: appointment scheduled on 05/31/24.  Patient was last seen in primary care on 05/17/2024 by [name], MD.  Called Nurse Triage reporting Hand Pain.  Symptoms began several days ago.  Interventions attempted: OTC medications: tylenol , Prescription medications: mobic , Rest, hydration, or home remedies, and Ice/heat application.  Symptoms are: gradually worsening.  Triage Disposition: See PCP When Office is Open (Within 3 Days)  Patient/caregiver understands and will follow disposition?: Yes  Copied from CRM [id number]. Topic: Clinical - Red Word Triage >> May 30, 2024  9:19 AM Rea ORN wrote: Red Word that prompted transfer to Nurse Triage: swollen and red finger, cant bend and is tender Reason for Disposition  [1] MODERATE pain (e.g., interferes with normal activities) AND [2] present > 3 days  Answer Assessment - Initial Assessment Questions Pt called in stating that her 3rd digit on R hand is swollen, red and warm to the touch. States that Dr. Jodie has been tx recurrent issue; per chart pt was seen for cellulitis and told to monitor BS levels and if symptoms worsened to f/u in 24-48hours. Pt states she is taking tylenol  and mobic  daily for pain, elevating finger and soaking it. Pt denies any visible wounds or sores. Pt states finger throbs when she puts her arm down. Discussed elevating hand above her heart, continuing pain medications and monitoring for fever. Pt voiced understanding. Appointment scheduled for evaluation. Patient agrees with plan of care, and will call back if anything changes, or if symptoms worsen.     1. ONSET: When did the pain start?      Recurrent; worsening   2. LOCATION and RADIATION: Where is the pain located?  (e.g., fingertip, around nail, joint, entire      R hand, 3rd digit   3. SEVERITY: How bad is the pain? What does it keep you from doing?   (Scale 1-10; or mild, moderate, severe)      8/10  4. APPEARANCE: What does the finger look like? (e.g., redness, swelling, bruising, pallor)     Redness and swelling at tip extending down 3rd digit of R hand   5. WORK OR EXERCISE: Has there been any recent work or exercise that involved this part (i.e., fingers or hand) of the body?     None   6. CAUSE: What do you think is causing the pain?     Pt previously tx for cellulitis; pt states she completed abx but swelling has flared back up   7. AGGRAVATING FACTORS: What makes the pain worse? (e.g., using computer)     Using hand; throbs when holding hand down   8. OTHER SYMPTOMS: Do you have any other symptoms? (e.g., fever, neck pain, numbness)  Protocols used: Finger Pain-A-AH

## 2024-05-31 ENCOUNTER — Ambulatory Visit: Admitting: Family

## 2024-05-31 ENCOUNTER — Encounter: Payer: Self-pay | Admitting: Family

## 2024-05-31 VITALS — BP 118/62 | HR 72 | Temp 97.3°F | Ht 63.0 in | Wt 212.8 lb

## 2024-05-31 DIAGNOSIS — L03011 Cellulitis of right finger: Secondary | ICD-10-CM

## 2024-05-31 NOTE — Progress Notes (Signed)
 Patient ID: Miranda Mathis, female    DOB: [birth year], 73 y.o.   MRN: [id number]  Chief Complaint  Patient presents with   Hand Pain    Pt c/o right hand middle finger pain, noticed around thanksgiving. Has tried abx, which did not help. Finger is red, swollen and painful.   Discussed the use of AI scribe software for clinical note transcription with the patient, who gave verbal consent to proceed.  History of Present Illness Miranda Mathis is a 73 year old female who presents with a persistent swollen and tender finger.  She has swelling, redness, and marked tenderness from the nail bed to the middle knuckle that has persisted despite two courses of Bactrim . Redness and tenderness recur, especially at night, and she cannot bend the finger due to pain. Epsom salt soaks have not improved the swelling. There was prior pus drainage from the nail area. She sometimes has pain in other fingers and occasionally the whole hand. She has not had similar finger infections before and does not have gout.  Assessment & Plan Cellulitis and paronychia of finger Right middle finger, erythema & swelling noted from nailbed up to middle joint. Persistent infection unresponsive to 2 rounds of Bactrim . Possible deeper infection or bone involvement. Referral to orthopedics advised. [facility] recommended calling [facility] on [address] for urgent referral.  - Referred to orthopedics for evaluation and possible drainage. - Advised ok to continue epsom salt soaks, and keep finger elevated as much as possible.  Subjective:    Outpatient Medications Prior to Visit  Medication Sig Dispense Refill   Cholecalciferol  (VITAMIN D ) 50 MCG (2000 UT) tablet Take 2,000 Units by mouth daily.      clobetasol  (TEMOVATE ) 0.05 % external solution APPLY 1 APPLICATION TOPICALLY 2 (TWO) TIMES DAILY. SCALP 100 mL 3   clotrimazole -betamethasone  (LOTRISONE ) cream APPLY 1 APPLICATION TOPICALLY DAILY 45 g 0   docusate sodium  (COLACE) 50 MG capsule Take 50 mg by mouth 2 (two) times daily.     furosemide  (LASIX ) 20 MG tablet TAKE 1 TABLET (20 MG TOTAL) BY MOUTH DAILY AS NEEDED FOR EDEMA. TAKE WITH POTASSIUM 90 tablet 3   gabapentin  (NEURONTIN ) 300 MG capsule TAKE 2 CAPSULES BY MOUTH 3 TIMES A DAY 540 capsule 3   glipiZIDE  (GLUCOTROL  XL) 5 MG 24 hr tablet Take 1 tablet (5 mg total) by mouth daily with breakfast. 90 tablet 3   KLOR-CON  M20 20 MEQ tablet TAKE 1 TABLET (20 MEQ TOTAL) BY MOUTH DAILY AS NEEDED (LEG SWELLING). TAKE WITH FUROSEMIDE  90 tablet 3   levothyroxine  (SYNTHROID ) 200 MCG tablet TAKE 1 TABLET BY MOUTH EVERY DAY 90 tablet 3   levothyroxine  (SYNTHROID ) 25 MCG tablet TAKE 1 TABLET BY MOUTH EVERY DAY 90 tablet 3   lisinopril  (ZESTRIL ) 5 MG tablet TAKE 1 TABLET (5 MG TOTAL) BY MOUTH DAILY. 90 tablet 3   meclizine  (ANTIVERT ) 12.5 MG tablet Take 1 tablet (12.5 mg total) by mouth 3 (three) times daily as needed for dizziness (May cause drowsiness.). 30 tablet 0   meloxicam  (MOBIC ) 7.5 MG tablet TAKE 1 TABLET BY MOUTH EVERY DAY AS NEEDED FOR PAIN 30 tablet 3   metFORMIN  (GLUCOPHAGE ) 1000 MG tablet TAKE 1 TABLET (1,000 MG TOTAL) BY MOUTH TWICE A DAY WITH FOOD 180 tablet 3   omeprazole  (PRILOSEC) 40 MG capsule TAKE 1 CAPSULE BY MOUTH EVERY DAY 90 capsule 1   ondansetron  (ZOFRAN -ODT) 4 MG disintegrating tablet Take 1 tablet (4 mg total)  by mouth every 8 (eight) hours as needed for nausea or vomiting. 20 tablet 0   rOPINIRole  (REQUIP ) 2 MG tablet Take 1 tablet (2 mg total) by mouth at bedtime. 90 tablet 3   rosuvastatin  (CRESTOR ) 40 MG tablet TAKE 1 TABLET BY MOUTH EVERY DAY 90 tablet 3   sertraline  (ZOLOFT ) 100 MG tablet TAKE 2 TABLETS BY MOUTH EVERY DAY 180 tablet 2   sulfamethoxazole -trimethoprim  (BACTRIM  DS) 800-160 MG tablet Take 1 tablet by mouth 2 (two) times daily. 14 tablet 0   traMADol  (ULTRAM ) 50 MG tablet Take 1 tablet (50 mg total) by mouth 2 (two) times daily as needed for moderate pain (pain score 4-6).  30 tablet 2   zolpidem  (AMBIEN ) 10 MG tablet Take 1 tablet (10 mg total) by mouth at bedtime as needed. for sleep 30 tablet 5   No facility-administered medications prior to visit.   Past Medical History:  Diagnosis Date   Allergic rhinitis    Allergy    seasonal allergies   Barrett's esophagus    Bladder spasms    Cataract    Chronic back pain    DDD (degenerative disc disease), lumbar    on meds   Degenerative scoliosis    Depression    on meds   Diabetes mellitus without complication (HCC)    on meds   Diabetic peripheral neuropathy (HCC)    Fibromyalgia    Full dentures    GAD (generalized anxiety disorder)    on meds   GERD (gastroesophageal reflux disease)    on meds   History of bladder stone    History of colon polyps    History of kidney stones    History of migraine    History of recurrent UTIs    History of sepsis    10-18-2018  bacterium-ecoli blood culture   Hydronephrosis    Hydronephrosis, bilateral    due to DM   Hyperlipidemia    on meds   Hypertension    on meds   Hypothyroidism 11/02/2013 dx   followed by pcp- on meds   IBS (irritable bowel syndrome)    Insomnia    Osteopenia    Osteopenia after menopause 06/06/2019   dexa 2019 T = -1.9, multiple site osteopenia; recheck 2021   Peripheral neuropathy    Psoriasis    Restless leg syndrome    Seborrheic dermatitis of scalp    Spondylolysis of lumbar region    Wears glasses    Past Surgical History:  Procedure Laterality Date   ABDOMINAL HYSTERECTOMY  1987   BALLOON DILATION  07/10/2011   Procedure: BALLOON DILATION;  Surgeon: Donnice Gwenyth Brooks, MD;  Location: [facility];  Service: Urology;  Laterality: Right;   CESAREAN SECTION  X2   CHOLECYSTECTOMY     COLONOSCOPY  2018   at [facility] (results not availabe at this time)   CYSTO/ BILATERAL URETEROSCOPY / URETERAL BX'S/ BILATERAL URETERAL STENT PLACEMENT/ BLADDER STONE EXTRACTION  03-13-2011   CYSTOSCOPY W/ RETROGRADES   07/10/2011   Procedure: CYSTOSCOPY WITH RETROGRADE PYELOGRAM;  Surgeon: Donnice Gwenyth Brooks, MD;  Location: [facility];  Service: Urology;  Laterality: Right;   CYSTOSCOPY W/ URETERAL STENT PLACEMENT  07/10/2011   Procedure: CYSTOSCOPY WITH STENT REPLACEMENT;  Surgeon: Donnice Gwenyth Brooks, MD;  Location: [facility];  Service: Urology;  Laterality: Right;   CYSTOSCOPY W/ URETERAL STENT PLACEMENT Bilateral 10/22/2018   Procedure: CYSTOSCOPY WITH RETROGRADE PYELOGRAM/URETERAL STENT PLACEMENT-BILATERAL;  Surgeon: Cam Katz  W, MD;  Location: [facility];  Service: Urology;  Laterality: Bilateral;   CYSTOSCOPY W/ URETERAL STENT REMOVAL  07/10/2011   Procedure: CYSTOSCOPY WITH STENT REMOVAL;  Surgeon: Donnice Gwenyth Brooks, MD;  Location: [facility];  Service: Urology;  Laterality: Right;   CYSTOSCOPY WITH URETEROSCOPY AND STENT PLACEMENT Bilateral 11/29/2018   Procedure: CYSTOSCOPY WITH BILATERAL URETEROSCOPY AND STENT EXCHANGE/ WITH LEFT BRUSH BIOPSY;  Surgeon: Brooks Donnice, MD;  Location: [facility];  Service: Urology;  Laterality: Bilateral;   CYSTOSCOPY/RETROGRADE/URETEROSCOPY Left 11/14/2019   Procedure: CYSTOSCOPY/RETROGRADE/URETEROSCOPY/ BIOPSY/ STENT PLACEMENT;  Surgeon: Brooks Donnice, MD;  Location: [facility];  Service: Urology;  Laterality: Left;   LUMBAR MICRODISCECTOMY  1990'S   L5 - S1   RIGHT FOOT SURG  2003   HEEL   RIGHT URETEROSCOPIC / URETERAL BX/ STENT PLACEMENT  04-17-2011   UPPER GASTROINTESTINAL ENDOSCOPY  2018   @ [facility] -results not available at this time   UPPER GI ENDOSCOPY     Allergies  Allergen Reactions   Ketorolac  Other (See Comments)    Other reaction(s): Other (See Comments) Not supposed to take PILLS      Objective:    Physical Exam Vitals and nursing note reviewed.  Constitutional:      Appearance: Normal appearance.  Cardiovascular:     Rate and Rhythm: Normal rate and regular  rhythm.  Pulmonary:     Effort: Pulmonary effort is normal.     Breath sounds: Normal breath sounds.  Musculoskeletal:        General: Normal range of motion.  Skin:    General: Skin is warm and dry.     Findings: Erythema (right middle finger, presents as abscess, tenderness & mild warmth, but is not responding to abt) present.  Neurological:     Mental Status: She is alert.  Psychiatric:        Mood and Affect: Mood normal.        Behavior: Behavior normal.     BP 118/62 (BP Location: Left Arm, Patient Position: Sitting, Cuff Size: Large)   Pulse 72   Temp (!) 97.3 F (36.3 C) (Temporal)   Ht 5' 3 (1.6 m)   Wt 212 lb 12.8 oz (96.5 kg)   SpO2 98%   BMI 37.70 kg/m  Wt Readings from Last 3 Encounters:  05/31/24 212 lb 12.8 oz (96.5 kg)  05/17/24 209 lb 3.2 oz (94.9 kg)  04/17/24 214 lb (97.1 kg)      Lucius Krabbe, NP

## 2024-06-04 NOTE — H&P (View-Only) (Signed)
 Miranda Mathis - 73 y.o. female MRN [id number]  Date of birth: 08-25-1950  Office Visit Note: Visit Date: 06/05/2024 PCP: [name], MD Referred by: Lucius Krabbe, NP  Subjective: No chief complaint on file.  HPI: Miranda Mathis is a pleasant 73 y.o. female who presents today for persistent cellulitis and paronychia of the right long finger that has been present now for multiple weeks.  She is unsure of the inciting mechanism, thinks it might have been from working with her rosebushes or potential recent visit to the nail salon.   Has had ongoing swelling and erythema in this region tracking up to the PIP level.  Resistant to multiple rounds of Bactrim .  Is also trialed Epsom soaks without significant relief.  Pertinent ROS were reviewed with the patient and found to be negative unless otherwise specified above in HPI.   Visit Reason: Right long finger paronychia Duration of symptoms: 1 month Hand dominance: right Occupation: retired Diabetic: Yes, 6.2  Smoking: No Heart/Lung History: none Blood Thinners:  none  Prior Testing/EMG: none Injections (Date): none Treatments: antibiotics Prior Surgery: none    Assessment & Plan: Visit Diagnoses:  1. Paronychia of finger, right   2. Pain in right finger(s)     Plan: Extensive discussion was had the patient today regarding her right long finger paronychia with notable cellulitis and fluid collection.  X-rays of the finger were obtained today which do show some lucency of the distal phalanx as well which is concerning.  Given the chronicity of this infection, also given that it has been resistant to conservative measures with antibiotic treatment and soaks, she is indicated for right long finger irrigation and debridement, nail plate removal, possible nailbed repair and possible bony debridement.  Surgery will be performed urgently tomorrow in the outpatient setting.  Risks and benefits of the procedure were discussed, risks  including but not limited to infection, bleeding, scarring, stiffness, nerve injury, tendon injury, vascular injury, hardware complication, recurrence of symptoms and need for subsequent operation.  X-rays were reviewed in detail today with the patient as well as well as the underlying lucency of the distal phalanx.  I did explain that should the infection have seeded into the distal phalanx, there could be an indication for potential revision amputation in the future for source control of potential osteomyelitis.  We also discussed the appropriate postoperative protocol and timeframe for return to activities and function.  Patient expressed understanding.     Follow-up: No follow-ups on file.   Meds & Orders: No orders of the defined types were placed in this encounter.   Orders Placed This Encounter  Procedures   XR Finger Middle Right     Procedures: No procedures performed      Clinical History: No specialty comments available.  She reports that she has never smoked. She has never used smokeless tobacco.  Recent Labs    11/16/23 0907 05/17/24 1451 05/17/24 1456  HGBA1C 5.9* 6.2 6.2*    Objective:   Vital Signs: There were no vitals taken for this visit.  Physical Exam  Gen: Well-appearing, in no acute distress; non-toxic CV: Regular Rate. Well-perfused. Warm.  Resp: Breathing unlabored on room air; no wheezing. Psych: Fluid speech in conversation; appropriate affect; normal thought process  Ortho Exam Right long finger: - Notable cellulitic picture to the distal aspect of the long finger with associated erythema, palpable fluid collection over the dorsal aspect of the distal phalanx with associated tenderness, no appreciated or  expressible drainage, range of motion is limited at the DIP secondary to pain currently, nail plate remains well fixated beneath the eponychial fold   Imaging: XR Finger Middle Right Result Date: 06/05/2024 X-rays of the right long finger,  multiple views were performed today X-rays demonstrate notable lucency throughout the distal phalanx with some at the distal aspect of the middle phalanx as well, degenerative changes are seen at the DIP joint with joint space narrowing and sclerosis   Past Medical/Family/Surgical/Social History: Medications & Allergies reviewed per EMR, new medications updated. Patient Active Problem List   Diagnosis Date Noted   Bilateral primary osteoarthritis of knee 05/17/2024   Restless leg syndrome 11/16/2023   Obesity, morbid (HCC) 08/16/2023   Stage 3a chronic kidney disease (HCC) 05/13/2023   Tubular adenoma of colon 01/26/2023   Chronic narcotic use 07/28/2022   Lower extremity edema 04/15/2021   Depression, major, single episode, moderate (HCC) 07/11/2020   Osteopenia after menopause 06/06/2019   Bilateral hydronephrosis    Degenerative scoliosis 09/07/2018   Seborrheic dermatitis of scalp 12/02/2017   Psoriasis 12/02/2017   DM type 2 with diabetic peripheral neuropathy (HCC) 09/02/2016   Recurrent kidney stones 08/29/2015   History of Barrett's esophagus 08/29/2015   GAD (generalized anxiety disorder) 08/29/2015   GERD (gastroesophageal reflux disease) 08/29/2015   Irritable bowel syndrome with constipation 08/29/2015   Lumbosacral spondylosis with radiculopathy 08/29/2015   Barrett's esophagus with esophagitis 08/29/2015   Primary insomnia 10/25/2014   Acquired hypothyroidism 11/02/2013   Mixed hyperlipidemia 04/04/2012   Allergic rhinitis 10/11/2007   Fibromyalgia 10/11/2007   Chronic low back pain 10/11/2007   Past Medical History:  Diagnosis Date   Allergic rhinitis    Allergy    seasonal allergies   Barrett's esophagus    Bladder spasms    Cataract    Chronic back pain    DDD (degenerative disc disease), lumbar    on meds   Degenerative scoliosis    Depression    on meds   Diabetes mellitus without complication (HCC)    on meds   Diabetic peripheral neuropathy  (HCC)    Fibromyalgia    Full dentures    GAD (generalized anxiety disorder)    on meds   GERD (gastroesophageal reflux disease)    on meds   History of bladder stone    History of colon polyps    History of kidney stones    History of migraine    History of recurrent UTIs    History of sepsis    10-18-2018  bacterium-ecoli blood culture   Hydronephrosis    Hydronephrosis, bilateral    due to DM   Hyperlipidemia    on meds   Hypertension    on meds   Hypothyroidism 11/02/2013 dx   followed by pcp- on meds   IBS (irritable bowel syndrome)    Insomnia    Osteopenia    Osteopenia after menopause 06/06/2019   dexa 2019 T = -1.9, multiple site osteopenia; recheck 2021   Peripheral neuropathy    Psoriasis    Restless leg syndrome    Seborrheic dermatitis of scalp    Spondylolysis of lumbar region    Wears glasses    Family History  Problem Relation Age of Onset   COPD Mother    Emphysema Mother    Osteoarthritis Mother    Rheum arthritis Mother    Arthritis Mother    Hypertension Mother    Prostate cancer Father  w/mets   Arthritis Father    Birth defects Father    Cancer Father    Hypertension Father    Arthritis Daughter    Asthma Daughter    Birth defects Daughter    Depression Daughter    Hearing loss Daughter    Mental retardation Daughter    Kidney Stones Daughter    Arthritis Brother    Hyperlipidemia Brother    Coronary artery disease Paternal Grandfather    Diabetes Paternal Aunt        x 3   Arthritis Maternal Grandmother    Colon cancer Neg Hx    Colon polyps Neg Hx    Esophageal cancer Neg Hx    Stomach cancer Neg Hx    Rectal cancer Neg Hx    Past Surgical History:  Procedure Laterality Date   ABDOMINAL HYSTERECTOMY  1987   BALLOON DILATION  07/10/2011   Procedure: BALLOON DILATION;  Surgeon: Donnice Gwenyth Brooks, MD;  Location: [facility];  Service: Urology;  Laterality: Right;   CESAREAN SECTION  X2    CHOLECYSTECTOMY     COLONOSCOPY  2018   at [facility] (results not availabe at this time)   CYSTO/ BILATERAL URETEROSCOPY / URETERAL BX'S/ BILATERAL URETERAL STENT PLACEMENT/ BLADDER STONE EXTRACTION  03-13-2011   CYSTOSCOPY W/ RETROGRADES  07/10/2011   Procedure: CYSTOSCOPY WITH RETROGRADE PYELOGRAM;  Surgeon: Donnice Gwenyth Brooks, MD;  Location: [facility];  Service: Urology;  Laterality: Right;   CYSTOSCOPY W/ URETERAL STENT PLACEMENT  07/10/2011   Procedure: CYSTOSCOPY WITH STENT REPLACEMENT;  Surgeon: Donnice Gwenyth Brooks, MD;  Location: [facility];  Service: Urology;  Laterality: Right;   CYSTOSCOPY W/ URETERAL STENT PLACEMENT Bilateral 10/22/2018   Procedure: CYSTOSCOPY WITH RETROGRADE PYELOGRAM/URETERAL STENT PLACEMENT-BILATERAL;  Surgeon: Cam Morene ORN, MD;  Location: [facility];  Service: Urology;  Laterality: Bilateral;   CYSTOSCOPY W/ URETERAL STENT REMOVAL  07/10/2011   Procedure: CYSTOSCOPY WITH STENT REMOVAL;  Surgeon: Donnice Gwenyth Brooks, MD;  Location: [facility];  Service: Urology;  Laterality: Right;   CYSTOSCOPY WITH URETEROSCOPY AND STENT PLACEMENT Bilateral 11/29/2018   Procedure: CYSTOSCOPY WITH BILATERAL URETEROSCOPY AND STENT EXCHANGE/ WITH LEFT BRUSH BIOPSY;  Surgeon: Brooks Donnice, MD;  Location: [facility];  Service: Urology;  Laterality: Bilateral;   CYSTOSCOPY/RETROGRADE/URETEROSCOPY Left 11/14/2019   Procedure: CYSTOSCOPY/RETROGRADE/URETEROSCOPY/ BIOPSY/ STENT PLACEMENT;  Surgeon: Brooks Donnice, MD;  Location: [facility];  Service: Urology;  Laterality: Left;   LUMBAR MICRODISCECTOMY  1990'S   L5 - S1   RIGHT FOOT SURG  2003   HEEL   RIGHT URETEROSCOPIC / URETERAL BX/ STENT PLACEMENT  04-17-2011   UPPER GASTROINTESTINAL ENDOSCOPY  2018   @ [facility] -results not available at this time   UPPER GI ENDOSCOPY     Social History   Occupational History   Occupation: LPN    Employer: [employer]  Tobacco Use   Smoking status: Never   Smokeless tobacco: Never  Vaping Use   Vaping status: Never Used  Substance and Sexual Activity   Alcohol use: Yes    Alcohol/week: 1.0 standard drink of alcohol    Types: 1 Standard drinks or equivalent per week    Comment: rarely   Drug use: No   Sexual activity: Yes    Partners: Male    Shriley Joffe Estela) Arlinda, M.D. [facility], Hand Surgery

## 2024-06-04 NOTE — Progress Notes (Unsigned)
 Miranda Mathis - 73 y.o. female MRN [id number]  Date of birth: 08-25-1950  Office Visit Note: Visit Date: 06/05/2024 PCP: [name], MD Referred by: Lucius Krabbe, NP  Subjective: No chief complaint on file.  HPI: Miranda Mathis is a pleasant 73 y.o. female who presents today for persistent cellulitis and paronychia of the right long finger that has been present now for multiple weeks.  She is unsure of the inciting mechanism, thinks it might have been from working with her rosebushes or potential recent visit to the nail salon.   Has had ongoing swelling and erythema in this region tracking up to the PIP level.  Resistant to multiple rounds of Bactrim .  Is also trialed Epsom soaks without significant relief.  Pertinent ROS were reviewed with the patient and found to be negative unless otherwise specified above in HPI.   Visit Reason: Right long finger paronychia Duration of symptoms: 1 month Hand dominance: right Occupation: retired Diabetic: Yes, 6.2  Smoking: No Heart/Lung History: none Blood Thinners:  none  Prior Testing/EMG: none Injections (Date): none Treatments: antibiotics Prior Surgery: none    Assessment & Plan: Visit Diagnoses:  1. Paronychia of finger, right   2. Pain in right finger(s)     Plan: Extensive discussion was had the patient today regarding her right long finger paronychia with notable cellulitis and fluid collection.  X-rays of the finger were obtained today which do show some lucency of the distal phalanx as well which is concerning.  Given the chronicity of this infection, also given that it has been resistant to conservative measures with antibiotic treatment and soaks, she is indicated for right long finger irrigation and debridement, nail plate removal, possible nailbed repair and possible bony debridement.  Surgery will be performed urgently tomorrow in the outpatient setting.  Risks and benefits of the procedure were discussed, risks  including but not limited to infection, bleeding, scarring, stiffness, nerve injury, tendon injury, vascular injury, hardware complication, recurrence of symptoms and need for subsequent operation.  X-rays were reviewed in detail today with the patient as well as well as the underlying lucency of the distal phalanx.  I did explain that should the infection have seeded into the distal phalanx, there could be an indication for potential revision amputation in the future for source control of potential osteomyelitis.  We also discussed the appropriate postoperative protocol and timeframe for return to activities and function.  Patient expressed understanding.     Follow-up: No follow-ups on file.   Meds & Orders: No orders of the defined types were placed in this encounter.   Orders Placed This Encounter  Procedures   XR Finger Middle Right     Procedures: No procedures performed      Clinical History: No specialty comments available.  She reports that she has never smoked. She has never used smokeless tobacco.  Recent Labs    11/16/23 0907 05/17/24 1451 05/17/24 1456  HGBA1C 5.9* 6.2 6.2*    Objective:   Vital Signs: There were no vitals taken for this visit.  Physical Exam  Gen: Well-appearing, in no acute distress; non-toxic CV: Regular Rate. Well-perfused. Warm.  Resp: Breathing unlabored on room air; no wheezing. Psych: Fluid speech in conversation; appropriate affect; normal thought process  Ortho Exam Right long finger: - Notable cellulitic picture to the distal aspect of the long finger with associated erythema, palpable fluid collection over the dorsal aspect of the distal phalanx with associated tenderness, no appreciated or  expressible drainage, range of motion is limited at the DIP secondary to pain currently, nail plate remains well fixated beneath the eponychial fold   Imaging: XR Finger Middle Right Result Date: 06/05/2024 X-rays of the right long finger,  multiple views were performed today X-rays demonstrate notable lucency throughout the distal phalanx with some at the distal aspect of the middle phalanx as well, degenerative changes are seen at the DIP joint with joint space narrowing and sclerosis   Past Medical/Family/Surgical/Social History: Medications & Allergies reviewed per EMR, new medications updated. Patient Active Problem List   Diagnosis Date Noted   Bilateral primary osteoarthritis of knee 05/17/2024   Restless leg syndrome 11/16/2023   Obesity, morbid (HCC) 08/16/2023   Stage 3a chronic kidney disease (HCC) 05/13/2023   Tubular adenoma of colon 01/26/2023   Chronic narcotic use 07/28/2022   Lower extremity edema 04/15/2021   Depression, major, single episode, moderate (HCC) 07/11/2020   Osteopenia after menopause 06/06/2019   Bilateral hydronephrosis    Degenerative scoliosis 09/07/2018   Seborrheic dermatitis of scalp 12/02/2017   Psoriasis 12/02/2017   DM type 2 with diabetic peripheral neuropathy (HCC) 09/02/2016   Recurrent kidney stones 08/29/2015   History of Barrett's esophagus 08/29/2015   GAD (generalized anxiety disorder) 08/29/2015   GERD (gastroesophageal reflux disease) 08/29/2015   Irritable bowel syndrome with constipation 08/29/2015   Lumbosacral spondylosis with radiculopathy 08/29/2015   Barrett's esophagus with esophagitis 08/29/2015   Primary insomnia 10/25/2014   Acquired hypothyroidism 11/02/2013   Mixed hyperlipidemia 04/04/2012   Allergic rhinitis 10/11/2007   Fibromyalgia 10/11/2007   Chronic low back pain 10/11/2007   Past Medical History:  Diagnosis Date   Allergic rhinitis    Allergy    seasonal allergies   Barrett's esophagus    Bladder spasms    Cataract    Chronic back pain    DDD (degenerative disc disease), lumbar    on meds   Degenerative scoliosis    Depression    on meds   Diabetes mellitus without complication (HCC)    on meds   Diabetic peripheral neuropathy  (HCC)    Fibromyalgia    Full dentures    GAD (generalized anxiety disorder)    on meds   GERD (gastroesophageal reflux disease)    on meds   History of bladder stone    History of colon polyps    History of kidney stones    History of migraine    History of recurrent UTIs    History of sepsis    10-18-2018  bacterium-ecoli blood culture   Hydronephrosis    Hydronephrosis, bilateral    due to DM   Hyperlipidemia    on meds   Hypertension    on meds   Hypothyroidism 11/02/2013 dx   followed by pcp- on meds   IBS (irritable bowel syndrome)    Insomnia    Osteopenia    Osteopenia after menopause 06/06/2019   dexa 2019 T = -1.9, multiple site osteopenia; recheck 2021   Peripheral neuropathy    Psoriasis    Restless leg syndrome    Seborrheic dermatitis of scalp    Spondylolysis of lumbar region    Wears glasses    Family History  Problem Relation Age of Onset   COPD Mother    Emphysema Mother    Osteoarthritis Mother    Rheum arthritis Mother    Arthritis Mother    Hypertension Mother    Prostate cancer Father  w/mets   Arthritis Father    Birth defects Father    Cancer Father    Hypertension Father    Arthritis Daughter    Asthma Daughter    Birth defects Daughter    Depression Daughter    Hearing loss Daughter    Mental retardation Daughter    Kidney Stones Daughter    Arthritis Brother    Hyperlipidemia Brother    Coronary artery disease Paternal Grandfather    Diabetes Paternal Aunt        x 3   Arthritis Maternal Grandmother    Colon cancer Neg Hx    Colon polyps Neg Hx    Esophageal cancer Neg Hx    Stomach cancer Neg Hx    Rectal cancer Neg Hx    Past Surgical History:  Procedure Laterality Date   ABDOMINAL HYSTERECTOMY  1987   BALLOON DILATION  07/10/2011   Procedure: BALLOON DILATION;  Surgeon: Donnice Gwenyth Brooks, MD;  Location: [facility];  Service: Urology;  Laterality: Right;   CESAREAN SECTION  X2    CHOLECYSTECTOMY     COLONOSCOPY  2018   at [facility] (results not availabe at this time)   CYSTO/ BILATERAL URETEROSCOPY / URETERAL BX'S/ BILATERAL URETERAL STENT PLACEMENT/ BLADDER STONE EXTRACTION  03-13-2011   CYSTOSCOPY W/ RETROGRADES  07/10/2011   Procedure: CYSTOSCOPY WITH RETROGRADE PYELOGRAM;  Surgeon: Donnice Gwenyth Brooks, MD;  Location: [facility];  Service: Urology;  Laterality: Right;   CYSTOSCOPY W/ URETERAL STENT PLACEMENT  07/10/2011   Procedure: CYSTOSCOPY WITH STENT REPLACEMENT;  Surgeon: Donnice Gwenyth Brooks, MD;  Location: [facility];  Service: Urology;  Laterality: Right;   CYSTOSCOPY W/ URETERAL STENT PLACEMENT Bilateral 10/22/2018   Procedure: CYSTOSCOPY WITH RETROGRADE PYELOGRAM/URETERAL STENT PLACEMENT-BILATERAL;  Surgeon: Cam Morene ORN, MD;  Location: [facility] OR;  Service: Urology;  Laterality: Bilateral;   CYSTOSCOPY W/ URETERAL STENT REMOVAL  07/10/2011   Procedure: CYSTOSCOPY WITH STENT REMOVAL;  Surgeon: Donnice Gwenyth Brooks, MD;  Location: [facility];  Service: Urology;  Laterality: Right;   CYSTOSCOPY WITH URETEROSCOPY AND STENT PLACEMENT Bilateral 11/29/2018   Procedure: CYSTOSCOPY WITH BILATERAL URETEROSCOPY AND STENT EXCHANGE/ WITH LEFT BRUSH BIOPSY;  Surgeon: Brooks Donnice, MD;  Location: [facility];  Service: Urology;  Laterality: Bilateral;   CYSTOSCOPY/RETROGRADE/URETEROSCOPY Left 11/14/2019   Procedure: CYSTOSCOPY/RETROGRADE/URETEROSCOPY/ BIOPSY/ STENT PLACEMENT;  Surgeon: Brooks Donnice, MD;  Location: [facility];  Service: Urology;  Laterality: Left;   LUMBAR MICRODISCECTOMY  1990'S   L5 - S1   RIGHT FOOT SURG  2003   HEEL   RIGHT URETEROSCOPIC / URETERAL BX/ STENT PLACEMENT  04-17-2011   UPPER GASTROINTESTINAL ENDOSCOPY  2018   @ [facility] -results not available at this time   UPPER GI ENDOSCOPY     Social History   Occupational History   Occupation: LPN    Employer: [employer]  Tobacco Use   Smoking status: Never   Smokeless tobacco: Never  Vaping Use   Vaping status: Never Used  Substance and Sexual Activity   Alcohol use: Yes    Alcohol/week: 1.0 standard drink of alcohol    Types: 1 Standard drinks or equivalent per week    Comment: rarely   Drug use: No   Sexual activity: Yes    Partners: Male    Shriley Joffe Estela) Arlinda, M.D. [facility], Hand Surgery

## 2024-06-05 ENCOUNTER — Encounter (HOSPITAL_BASED_OUTPATIENT_CLINIC_OR_DEPARTMENT_OTHER): Payer: Self-pay | Admitting: Orthopedic Surgery

## 2024-06-05 ENCOUNTER — Other Ambulatory Visit: Payer: Self-pay

## 2024-06-05 ENCOUNTER — Ambulatory Visit: Admitting: Orthopedic Surgery

## 2024-06-05 DIAGNOSIS — L03011 Cellulitis of right finger: Secondary | ICD-10-CM

## 2024-06-05 DIAGNOSIS — M79644 Pain in right finger(s): Secondary | ICD-10-CM

## 2024-06-06 ENCOUNTER — Encounter (HOSPITAL_BASED_OUTPATIENT_CLINIC_OR_DEPARTMENT_OTHER): Admission: RE | Disposition: A | Payer: Self-pay | Source: Home / Self Care | Attending: Orthopedic Surgery

## 2024-06-06 ENCOUNTER — Ambulatory Visit (HOSPITAL_BASED_OUTPATIENT_CLINIC_OR_DEPARTMENT_OTHER): Admitting: Anesthesiology

## 2024-06-06 ENCOUNTER — Encounter (HOSPITAL_BASED_OUTPATIENT_CLINIC_OR_DEPARTMENT_OTHER): Payer: Self-pay | Admitting: Orthopedic Surgery

## 2024-06-06 ENCOUNTER — Ambulatory Visit (HOSPITAL_BASED_OUTPATIENT_CLINIC_OR_DEPARTMENT_OTHER)
Admission: RE | Admit: 2024-06-06 | Discharge: 2024-06-06 | Disposition: A | Attending: Orthopedic Surgery | Admitting: Orthopedic Surgery

## 2024-06-06 DIAGNOSIS — X58XXXA Exposure to other specified factors, initial encounter: Secondary | ICD-10-CM | POA: Insufficient documentation

## 2024-06-06 DIAGNOSIS — I10 Essential (primary) hypertension: Secondary | ICD-10-CM

## 2024-06-06 DIAGNOSIS — E1142 Type 2 diabetes mellitus with diabetic polyneuropathy: Secondary | ICD-10-CM

## 2024-06-06 DIAGNOSIS — S6981XA Other specified injuries of right wrist, hand and finger(s), initial encounter: Secondary | ICD-10-CM | POA: Insufficient documentation

## 2024-06-06 DIAGNOSIS — E119 Type 2 diabetes mellitus without complications: Secondary | ICD-10-CM | POA: Diagnosis not present

## 2024-06-06 DIAGNOSIS — E1122 Type 2 diabetes mellitus with diabetic chronic kidney disease: Secondary | ICD-10-CM | POA: Diagnosis not present

## 2024-06-06 DIAGNOSIS — K219 Gastro-esophageal reflux disease without esophagitis: Secondary | ICD-10-CM | POA: Insufficient documentation

## 2024-06-06 DIAGNOSIS — L03011 Cellulitis of right finger: Secondary | ICD-10-CM

## 2024-06-06 DIAGNOSIS — I129 Hypertensive chronic kidney disease with stage 1 through stage 4 chronic kidney disease, or unspecified chronic kidney disease: Secondary | ICD-10-CM | POA: Insufficient documentation

## 2024-06-06 DIAGNOSIS — F418 Other specified anxiety disorders: Secondary | ICD-10-CM | POA: Diagnosis not present

## 2024-06-06 DIAGNOSIS — N1831 Chronic kidney disease, stage 3a: Secondary | ICD-10-CM | POA: Insufficient documentation

## 2024-06-06 HISTORY — PX: INCISION AND DRAINAGE OF DEEP ABSCESS, HAND: SHX7323

## 2024-06-06 HISTORY — PX: NAILBED REPAIR: SHX5028

## 2024-06-06 LAB — GLUCOSE, CAPILLARY
Glucose-Capillary: 118 mg/dL — ABNORMAL HIGH (ref 70–99)
Glucose-Capillary: 103 mg/dL — ABNORMAL HIGH (ref 70–99)

## 2024-06-06 SURGERY — INCISION AND DRAINAGE OF DEEP ABSCESS, HAND
Anesthesia: General | Site: Ring Finger | Laterality: Right

## 2024-06-06 MED ORDER — FENTANYL CITRATE (PF) 100 MCG/2ML IJ SOLN: 25.0000 ug | INTRAMUSCULAR | Status: DC | PRN

## 2024-06-06 MED ORDER — TRAMADOL HCL 50 MG PO TABS: 50.0000 mg | ORAL_TABLET | Freq: Four times a day (QID) | ORAL | 0 refills | Status: DC | PRN

## 2024-06-06 MED ORDER — CEFAZOLIN SODIUM-DEXTROSE 2-4 GM/100ML-% IV SOLN
INTRAVENOUS | Status: AC
  Filled 2024-06-06: qty 100

## 2024-06-06 MED ORDER — CEFAZOLIN SODIUM-DEXTROSE 2-4 GM/100ML-% IV SOLN
2.0000 g | INTRAVENOUS | Status: AC
  Administered 2024-06-06: 13:00:00 2 g via INTRAVENOUS

## 2024-06-06 MED ORDER — FENTANYL CITRATE (PF) 100 MCG/2ML IJ SOLN
INTRAMUSCULAR | Status: DC | PRN
  Administered 2024-06-06: 13:00:00 25 ug via INTRAVENOUS

## 2024-06-06 MED ORDER — PROPOFOL 10 MG/ML IV BOLUS
INTRAVENOUS | Status: DC | PRN
  Administered 2024-06-06 (×2): 20 mg via INTRAVENOUS

## 2024-06-06 MED ORDER — ACETAMINOPHEN 10 MG/ML IV SOLN: 1000.0000 mg | Freq: Once | INTRAVENOUS | Status: DC | PRN

## 2024-06-06 MED ORDER — FENTANYL CITRATE (PF) 100 MCG/2ML IJ SOLN
INTRAMUSCULAR | Status: AC
  Filled 2024-06-06: qty 2

## 2024-06-06 MED ORDER — DROPERIDOL 2.5 MG/ML IJ SOLN: 0.6250 mg | Freq: Once | INTRAMUSCULAR | Status: DC | PRN

## 2024-06-06 MED ORDER — LIDOCAINE HCL (CARDIAC) PF 100 MG/5ML IV SOSY
PREFILLED_SYRINGE | INTRAVENOUS | Status: DC | PRN
  Administered 2024-06-06: 13:00:00 20 mg via INTRAVENOUS

## 2024-06-06 MED ORDER — ONDANSETRON HCL 4 MG/2ML IJ SOLN
INTRAMUSCULAR | Status: DC | PRN
  Administered 2024-06-06: 14:00:00 4 mg via INTRAVENOUS

## 2024-06-06 MED ORDER — LACTATED RINGERS IV SOLN: INTRAVENOUS | Status: DC

## 2024-06-06 MED ORDER — LIDOCAINE HCL 1 % IJ SOLN
INTRAMUSCULAR | Status: DC | PRN
  Administered 2024-06-06: 13:00:00 10 mL

## 2024-06-06 MED ORDER — PROPOFOL 500 MG/50ML IV EMUL
INTRAVENOUS | Status: DC | PRN
  Administered 2024-06-06: 13:00:00 140 ug/kg/min via INTRAVENOUS

## 2024-06-06 SURGICAL SUPPLY — 42 items
BLADE SURG 15 STRL LF DISP TIS (BLADE) ×2 IMPLANT
BNDG COHESIVE 1X5 TAN STRL LF (GAUZE/BANDAGES/DRESSINGS) IMPLANT
BNDG COHESIVE 4X5 TAN STRL LF (GAUZE/BANDAGES/DRESSINGS) ×1 IMPLANT
BNDG COMPR ESMARK 4X3 LF (GAUZE/BANDAGES/DRESSINGS) ×1 IMPLANT
BNDG ELASTIC 3INX 5YD STR LF (GAUZE/BANDAGES/DRESSINGS) IMPLANT
BNDG ELASTIC 4INX 5YD STR LF (GAUZE/BANDAGES/DRESSINGS) ×1 IMPLANT
BNDG GAUZE DERMACEA FLUFF 4 (GAUZE/BANDAGES/DRESSINGS) ×1 IMPLANT
CHLORAPREP W/TINT 26 (MISCELLANEOUS) ×1 IMPLANT
CORD BIPOLAR FORCEPS 12FT (ELECTRODE) ×1 IMPLANT
COVER BACK TABLE 60X90IN (DRAPES) ×1 IMPLANT
CUFF TOURN SGL QUICK 18X4 (TOURNIQUET CUFF) IMPLANT
DRAPE HAND 77X146 (DRAPES) ×1 IMPLANT
DRAPE OEC MINIVIEW 54X84 (DRAPES) ×1 IMPLANT
DRAPE SURG 17X23 STRL (DRAPES) ×1 IMPLANT
DRSG TELFA 3X8 NADH STRL (GAUZE/BANDAGES/DRESSINGS) IMPLANT
GAUZE SPONGE 4X4 12PLY STRL (GAUZE/BANDAGES/DRESSINGS) ×1 IMPLANT
GAUZE STRETCH 2X75IN STRL (MISCELLANEOUS) IMPLANT
GAUZE XEROFORM 1X8 LF (GAUZE/BANDAGES/DRESSINGS) IMPLANT
GLOVE BIO SURGEON STRL SZ7.5 (GLOVE) ×1 IMPLANT
GLOVE BIOGEL PI IND STRL 7.5 (GLOVE) ×1 IMPLANT
GOWN STRL REUS W/ TWL LRG LVL3 (GOWN DISPOSABLE) ×2 IMPLANT
GOWN STRL REUS W/TWL XL LVL3 (GOWN DISPOSABLE) IMPLANT
GOWN STRL SURGICAL XL XLNG (GOWN DISPOSABLE) ×1 IMPLANT
NDL HYPO 25X5/8 SAFETYGLIDE (NEEDLE) IMPLANT
NEEDLE HYPO 25X5/8 SAFETYGLIDE (NEEDLE) IMPLANT
PACK BASIN DAY SURGERY FS (CUSTOM PROCEDURE TRAY) ×1 IMPLANT
SHEET MEDIUM DRAPE 40X70 STRL (DRAPES) ×1 IMPLANT
SLEEVE SCD COMPRESS KNEE MED (STOCKING) ×1 IMPLANT
SOLN 0.9% NACL POUR BTL 1000ML (IV SOLUTION) IMPLANT
SPIKE FLUID TRANSFER (MISCELLANEOUS) IMPLANT
STOCKINETTE IMPERVIOUS 9X36 MD (GAUZE/BANDAGES/DRESSINGS) ×1 IMPLANT
SUCTION TUBE FRAZIER 10FR DISP (SUCTIONS) IMPLANT
SUT ETHILON 4 0 PS 2 18 (SUTURE) IMPLANT
SUT MNCRL AB 4-0 PS2 18 (SUTURE) IMPLANT
SUT MON AB 5-0 P3 18 (SUTURE) IMPLANT
SUT VIC AB 3-0 SH 27X BRD (SUTURE) IMPLANT
SWAB COLLECTION DEVICE MRSA (MISCELLANEOUS) IMPLANT
SWAB CULTURE ESWAB REG 1ML (MISCELLANEOUS) IMPLANT
SYR BULB EAR ULCER 3OZ GRN STR (SYRINGE) ×2 IMPLANT
SYR CONTROL 10ML LL (SYRINGE) IMPLANT
TOWEL GREEN STERILE FF (TOWEL DISPOSABLE) ×2 IMPLANT
TUBE CONNECTING 20X1/4 (TUBING) IMPLANT

## 2024-06-06 NOTE — Anesthesia Postprocedure Evaluation (Signed)
 Anesthesia Post Note  Patient: Miranda Mathis  Procedure(s) Performed: INCISION AND DRAINAGE OF DEEP ABSCESS, HAND (Right: Ring Finger) REPAIR, NAIL BED (Right: Ring Finger)     Patient location during evaluation: PACU Anesthesia Type: MAC Level of consciousness: awake and alert Pain management: pain level controlled Vital Signs Assessment: post-procedure vital signs reviewed and stable Respiratory status: spontaneous breathing, nonlabored ventilation, respiratory function stable and patient connected to nasal cannula oxygen Cardiovascular status: stable and blood pressure returned to baseline Postop Assessment: no apparent nausea or vomiting Anesthetic complications: no   No notable events documented.  Last Vitals:  Vitals:   06/06/24 1411 06/06/24 1428  BP: 122/65 136/80  Pulse: 67 65  Resp: 18 18  Temp: 36.7 C   SpO2: 95% 95%    Last Pain:  Vitals:   06/06/24 1411  TempSrc:   PainSc: 0-No pain                 Cordella P Cyanne Delmar

## 2024-06-06 NOTE — Op Note (Signed)
 NAME: Miranda Mathis MEDICAL RECORD NO: [id number] DATE OF BIRTH: [birth year] FACILITY: [facility] LOCATION: [facility] PHYSICIAN: GILDARDO ALDERTON, MD   OPERATIVE REPORT   DATE OF PROCEDURE: 06/06/2024    PREOPERATIVE DIAGNOSIS: Right long finger chronic paronychia   POSTOPERATIVE DIAGNOSIS: Right long finger chronic paronychia, nailbed injury   PROCEDURE: Right long finger incision and drainage for chronic paronychia, complicated Right long finger nail plate removal with nailbed repair   SURGEON:  Gildardo Alderton, M.D.   ASSISTANT: Joesph Dinsmore, OPA   ANESTHESIA:  Local with sedation   INTRAVENOUS FLUIDS:  Per anesthesia flow sheet.   ESTIMATED BLOOD LOSS:  Minimal.   COMPLICATIONS:  None.   SPECIMENS: Cultures taken    DISPOSITION:  Stable to PACU.   INDICATIONS: 73 year old female seen in the outpatient setting and found to have a right long finger chronic paronychia with concern for abscess resistant to conservative measures in the form of oral antibiotics and ongoing soaks.  Patient was indicated for right long finger incision and drainage, possible nail plate removal and underlying nailbed repair.  We also discussed the potential need for bony debridement.  Risks and benefits of surgery were discussed including the risks of infection, bleeding, scarring, stiffness, nerve injury, vascular injury, tendon injury, need for subsequent operation, persistent infection, need for repeat irrigation debridement, recurrence.  She voiced understanding of these risks and elected to proceed.  OPERATIVE COURSE: Patient was seen and identified in the preoperative area and marked appropriately.  Surgical consent had been signed. Preoperative IV antibiotic prophylaxis was given. She was transferred to the operating room and placed in supine position with the Right Right upper extremity on an arm board.  Sedation was induced by the anesthesiologist.  Right upper extremity was  prepped and draped in normal sterile orthopedic fashion.  A surgical pause was performed between the surgeons, anesthesia, and operating room staff and all were in agreement as to the patient, procedure, and site of procedure.  Tourniquet was placed and padded appropriately to the right upper arm, however a finger tourniquet was utilized for this case.  Finger tourniquet was applied utilizing a Penrose drain to the right long finger base.  Longitudinal incision was designed just proximal to the eponychial fold of the right long finger, skin flap was elevated to expose the underlying fluid collection.  Loculated fibrous tissue was encountered, cultures were taken.  Debridement was performed sharply utilizing combination of 15 blade and rongeur with curette.  Extensor mechanism was retracted gently to expose the underlying distal phalanx for bony debridement utilizing a curette.  Attention was then turned to the nailbed.  Nail plate was elevated utilizing a Therapist, nutritional to expose the underlying nailbed.  Infection had spread to the nailbed, longitudinal defect was noted to the nailbed with weakened tissue.  Nail plate was removed in its entirety to expose the underlying nailbed defect.  Nailbed repair was performed utilizing 5-0 Monocryl in simple standard fashion, Dermabond was also applied.   The finger tourniquet was subsequently removed.  Bipolar electrocautery was utilized for hemostasis.  Longitudinal incision proximal to the eponychial fold was closed utilizing 4-0 nylon in simple standard fashion.  Fingertip was pink with brisk capillary refill after removal of finger tourniquet.  Sterile dressings were applied utilizing a soft wrap for the right long finger.  The operative drapes were broken down.  The patient was awoken from anesthesia safely and taken to PACU in stable condition.   Post-operative plan: The patient will  recover in the post-anesthesia care unit and then be discharged home.  The  patient will be non weight bearing on the right upper extremity in a soft dressing.   I will see the patient back in the office in 1 week for postoperative followup.  Discharge instructions were provided for dressing maintenance and pain control.  Cultures will be followed.  Jayde Daffin, MD Electronically signed, 06/06/2024

## 2024-06-06 NOTE — Interval H&P Note (Signed)
 History and Physical Interval Note:  06/06/2024 12:10 PM  Miranda Mathis  has presented today for surgery, with the diagnosis of RIGHT LONG FINGER PARONYCHIA WITH OSTEOMYLITIS.  The various methods of treatment have been discussed with the patient and family. After consideration of risks, benefits and other options for treatment, the patient has consented to  Procedures with comments: INCISION AND DRAINAGE OF DEEP ABSCESS, HAND (Right) - RIGHT LONG FINGER IRRIGATION AND DEBRIDEMENT, POSSIBLE BONY DEBRIDEMENT WITH POSSIBLE NAIL PLATE/ NAIL BED REPAIR REPAIR, NAIL BED (Right) as a surgical intervention.  The patient's history has been reviewed, patient examined, no change in status, stable for surgery.  I have reviewed the patient's chart and labs.  Questions were answered to the patient's satisfaction.     [name]

## 2024-06-06 NOTE — Transfer of Care (Signed)
 Immediate Anesthesia Transfer of Care Note  Patient: Miranda Mathis  Procedure(s) Performed: INCISION AND DRAINAGE OF DEEP ABSCESS, HAND (Right: Ring Finger) REPAIR, NAIL BED (Right: Ring Finger)  Patient Location: PACU  Anesthesia Type:MAC  Level of Consciousness: awake and patient cooperative  Airway & Oxygen Therapy: Patient Spontanous Breathing  Post-op Assessment: Report given to RN and Post -op Vital signs reviewed and stable  Post vital signs: Reviewed and stable  Last Vitals:  Vitals Value Taken Time  BP 122/65 06/06/24 14:11  Temp    Pulse 68 06/06/24 14:15  Resp 22 06/06/24 14:15  SpO2 95 % 06/06/24 14:15  Vitals shown include unfiled device data.  Last Pain:  Vitals:   06/06/24 1231  TempSrc: Temporal  PainSc: 7       Patients Stated Pain Goal: 3 (06/06/24 1231)  Complications: No notable events documented.

## 2024-06-06 NOTE — Discharge Instructions (Addendum)
   Hand Surgery Postop Instructions   Dressings: Maintain postoperative dressing until orthopedic follow-up.  Keep operative site clean and dry until orthopedic follow-up.  Wound Care: Keep your hand elevated above the level of your heart.  Do not allow it to dangle by your side. Moving your fingers is advised to stimulate circulation but will depend on the site of your surgery.  If you have a splint applied, your doctor will advise you regarding movement.  Activity: Do not drive or operate machinery until clearance given from physician. No heavy lifting with operative extremity.  Diet:  Drink liquids today or eat a light diet.  You may resume a regular diet tomorrow.    General expectations: Take prescribed medication if given, transition to over-the-counter medication as quickly as possible. Fingers may become slightly swollen.  Call your doctor if any of the following occur: Severe pain not relieved by pain medication. Elevated temperature. Dressing soaked with blood. Inability to move fingers. White or bluish color to fingers.   Per [facility] clinic policy, our goal is ensure optimal postoperative pain control with a multimodal pain management strategy. For all [facility] patients, our goal is to wean post-operative narcotic medications by 6 weeks post-operatively. If this is not possible due to utilization of pain medication prior to surgery, your [facility] doctor will support your acute post-operative pain control for the first 6 weeks postoperatively, with a plan to transition you back to your primary pain team following that. Cyndia Skeeters will work to ensure a Therapist, occupational.  Anshul Trevor Mace, M.D. Hand Surgery [facility]   Post Anesthesia Home Care Instructions  Activity: Get plenty of rest for the remainder of the day. A responsible individual must stay with you for 24 hours following the procedure.  For the next 24 hours, DO NOT: -Drive a  car -Advertising copywriter -Drink alcoholic beverages -Take any medication unless instructed by your physician -Make any legal decisions or sign important papers.  Meals: Start with liquid foods such as gelatin or soup. Progress to regular foods as tolerated. Avoid greasy, spicy, heavy foods. If nausea and/or vomiting occur, drink only clear liquids until the nausea and/or vomiting subsides. Call your physician if vomiting continues.  Special Instructions/Symptoms: Your throat may feel dry or sore from the anesthesia or the breathing tube placed in your throat during surgery. If this causes discomfort, gargle with warm salt water. The discomfort should disappear within 24 hours.  If you had a scopolamine patch placed behind your ear for the management of post- operative nausea and/or vomiting:  1. The medication in the patch is effective for 72 hours, after which it should be removed.  Wrap patch in a tissue and discard in the trash. Wash hands thoroughly with soap and water. 2. You may remove the patch earlier than 72 hours if you experience unpleasant side effects which may include dry mouth, dizziness or visual disturbances. 3. Avoid touching the patch. Wash your hands with soap and water after contact with the patch.

## 2024-06-06 NOTE — Anesthesia Preprocedure Evaluation (Addendum)
 Anesthesia Evaluation  Patient identified by MRN, date of birth, ID band Patient awake    Reviewed: Allergy & Precautions, NPO status , Patient's Chart, lab work & pertinent test results  Airway Mallampati: II  TM Distance: >3 FB Neck ROM: Full    Dental no notable dental hx. (+) Edentulous Upper, Edentulous Lower   Pulmonary neg pulmonary ROS   Pulmonary exam normal        Cardiovascular hypertension, Pt. on medications  Rhythm:Regular Rate:Normal     Neuro/Psych   Anxiety Depression    negative neurological ROS     GI/Hepatic Neg liver ROS,GERD  Medicated,,  Endo/Other  diabetesHypothyroidism    Renal/GU Renal disease  negative genitourinary   Musculoskeletal  (+) Arthritis ,  Fibromyalgia -Hand abscess   Abdominal Normal abdominal exam  (+)   Peds  Hematology negative hematology ROS (+)   Anesthesia Other Findings   Reproductive/Obstetrics                              Anesthesia Physical Anesthesia Plan  ASA: 3  Anesthesia Plan: MAC   Post-op Pain Management:    Induction: Intravenous  PONV Risk Score and Plan: 2 and Ondansetron , Dexamethasone , Treatment may vary due to age or medical condition and Propofol  infusion  Airway Management Planned:   Additional Equipment: None  Intra-op Plan:   Post-operative Plan:   Informed Consent: I have reviewed the patients History and Physical, chart, labs and discussed the procedure including the risks, benefits and alternatives for the proposed anesthesia with the patient or authorized representative who has indicated his/her understanding and acceptance.     Dental advisory given  Plan Discussed with: CRNA  Anesthesia Plan Comments:          Anesthesia Quick Evaluation

## 2024-06-07 ENCOUNTER — Encounter (HOSPITAL_BASED_OUTPATIENT_CLINIC_OR_DEPARTMENT_OTHER): Payer: Self-pay | Admitting: Orthopedic Surgery

## 2024-06-07 LAB — ACID FAST SMEAR (AFB, MYCOBACTERIA): Acid Fast Smear: NEGATIVE

## 2024-06-08 NOTE — Progress Notes (Unsigned)
° °  Miranda Mathis - 73 y.o. female MRN [id number]  Date of birth: August 01, 1950  Office Visit Note: Visit Date: 06/12/2024 PCP: [name], MD Referred by: [name], MD  Subjective:  HPI: Miranda Mathis is a 73 y.o. female who presents today for follow up 1 week status post right long finger incision and drainage for chronic paronychia complicated. Right long finger nail plate removal with nailbed repair.  Pertinent ROS were reviewed with the patient and found to be negative unless otherwise specified above in HPI.   Assessment & Plan: Visit Diagnoses: No diagnosis found.  Plan: ***  Follow-up: No follow-ups on file.   Meds & Orders: No orders of the defined types were placed in this encounter.  No orders of the defined types were placed in this encounter.    Procedures: No procedures performed       Objective:   Vital Signs: There were no vitals taken for this visit.  Ortho Exam ***  Imaging: No results found.   Anshul Afton Alderton, M.D. [facility], Hand Surgery

## 2024-06-11 LAB — AEROBIC/ANAEROBIC CULTURE W GRAM STAIN (SURGICAL/DEEP WOUND)
Culture: NO GROWTH
Gram Stain: NONE SEEN

## 2024-06-12 ENCOUNTER — Ambulatory Visit: Admitting: Orthopedic Surgery

## 2024-06-12 ENCOUNTER — Other Ambulatory Visit: Payer: Self-pay | Admitting: Orthopedic Surgery

## 2024-06-12 DIAGNOSIS — L03011 Cellulitis of right finger: Secondary | ICD-10-CM

## 2024-06-12 MED ORDER — OXYCODONE HCL 5 MG PO TABS: 5.0000 mg | ORAL_TABLET | Freq: Four times a day (QID) | ORAL | 0 refills | Status: AC | PRN

## 2024-06-19 ENCOUNTER — Ambulatory Visit (INDEPENDENT_AMBULATORY_CARE_PROVIDER_SITE_OTHER): Admitting: Orthopedic Surgery

## 2024-06-19 DIAGNOSIS — L03011 Cellulitis of right finger: Secondary | ICD-10-CM

## 2024-06-19 NOTE — Progress Notes (Signed)
" ° °  Miranda Mathis - 72 y.o. female MRN [id number]  Date of birth: Dec 06, 1950  Office Visit Note: Visit Date: 06/19/2024 PCP: [name], MD Referred by: [name], MD  Subjective:  HPI: Miranda Mathis is a 73 y.o. female who presents today for follow up 2 weeks status post right long finger incision and drainage for chronic paronychia complicated. Right long finger nail plate removal with nailbed repair.  Pertinent ROS were reviewed with the patient and found to be negative unless otherwise specified above in HPI.   Assessment & Plan: Visit Diagnoses:  1. Paronychia of finger, right     Plan: She is doing well overall.  Sutures removed today, wound is well-healing.  No evidence of recurrent infection.  I did once again discussed with her the underlying nailbed damage that she had preoperatively, and need for subsequent nailbed repair.  Will take a watch and wait approach to see how the new nail grows in, I explained that this will take multiple months.  She is welcome return to me at any point for any concerns moving forward.  Return precautions were discussed  Follow-up: No follow-ups on file.   Meds & Orders: No orders of the defined types were placed in this encounter.  No orders of the defined types were placed in this encounter.    Procedures: No procedures performed       Objective:   Vital Signs: There were no vitals taken for this visit.  Ortho Exam Right long finger with well-healing incision, sutures removed, skin edges well-approximated without erythema or drainage, range of motion is preserved at the DIP  Imaging: No results found.   Miranda Mathis, M.D. [facility], Hand Surgery  "

## 2024-06-28 ENCOUNTER — Other Ambulatory Visit: Payer: Self-pay | Admitting: Family Medicine

## 2024-06-28 NOTE — Telephone Encounter (Signed)
 05/31/2024 LOV  02/22/2024 fill date  30/3 refills

## 2024-07-05 LAB — FUNGAL ORGANISM REFLEX

## 2024-07-05 LAB — FUNGUS CULTURE WITH STAIN

## 2024-07-05 LAB — FUNGUS CULTURE RESULT

## 2024-07-19 ENCOUNTER — Other Ambulatory Visit: Payer: Self-pay

## 2024-07-19 ENCOUNTER — Emergency Department (HOSPITAL_COMMUNITY)

## 2024-07-19 ENCOUNTER — Emergency Department (HOSPITAL_COMMUNITY): Admission: EM | Admit: 2024-07-19 | Discharge: 2024-07-19 | Disposition: A | Discharge status: Home / Self Care

## 2024-07-19 DIAGNOSIS — M25572 Pain in left ankle and joints of left foot: Secondary | ICD-10-CM | POA: Insufficient documentation

## 2024-07-19 DIAGNOSIS — S82832A Other fracture of upper and lower end of left fibula, initial encounter for closed fracture: Secondary | ICD-10-CM | POA: Insufficient documentation

## 2024-07-19 DIAGNOSIS — M25571 Pain in right ankle and joints of right foot: Secondary | ICD-10-CM | POA: Insufficient documentation

## 2024-07-19 DIAGNOSIS — W010XXA Fall on same level from slipping, tripping and stumbling without subsequent striking against object, initial encounter: Secondary | ICD-10-CM | POA: Diagnosis not present

## 2024-07-19 DIAGNOSIS — M25561 Pain in right knee: Secondary | ICD-10-CM | POA: Diagnosis not present

## 2024-07-19 DIAGNOSIS — W19XXXA Unspecified fall, initial encounter: Secondary | ICD-10-CM

## 2024-07-19 DIAGNOSIS — S8992XA Unspecified injury of left lower leg, initial encounter: Secondary | ICD-10-CM | POA: Diagnosis present

## 2024-07-19 MED ORDER — OXYCODONE-ACETAMINOPHEN 5-325 MG PO TABS
1.0000 | ORAL_TABLET | Freq: Once | ORAL | Status: AC
  Administered 2024-07-19: 1 via ORAL
  Filled 2024-07-19: qty 1

## 2024-07-19 NOTE — TOC CM/SW Note (Addendum)
 TOC consulted for a FWW. [facility] after hours line contacted at [phone]. Per rep, order needs to be emailed to [email or web address]. CM sent email. Unknown delivery time frame.  Merilee Batty, MSN, RN Case Management [phone]   [phone]: Received call from [employer]. As patient has already left the hospital, they will deliver FWW during normal business hours to patient's home tomorrow.

## 2024-07-19 NOTE — ED Provider Triage Note (Signed)
 Emergency Medicine Provider Triage Evaluation Note  Miranda Mathis , a 74 y.o. female  was evaluated in triage.  Pt complains of fall patient reportedly had mechanical fall last night while visiting her daughter in the ICU.  States that she twisted and landed against bilateral knees but is primarily dorsal pain left knee.  Also endorses pain to the left ankle without any obvious visible deformity.  Reports difficulty weightbearing since her initial fall.  Has tried gabapentin  and Tylenol  at home without improvement in symptoms..  Review of Systems  Positive: As above Negative: As above  Physical Exam  BP (!) 105/43 (BP Location: Left Arm)   Pulse 97   Temp 98.2 F (36.8 C) (Oral)   Resp 16   Ht 5' 2 (1.575 m)   Wt 99.8 kg   SpO2 100%   BMI 40.24 kg/m  Gen:   Awake, no distress   Resp:  Normal effort  MSK:   No visible deformity in general swelling of bilateral lower legs likely secondary to edema.  No bruising seen.  Tenderness to medial and lateral aspects of the left ankle.  Range of motion of the left knee is limited due to pain. Other:    Medical Decision Making  Medically screening exam initiated at 2:07 PM.  Appropriate orders placed.  Herlinda R Sampley was informed that the remainder of the evaluation will be completed by another provider, this initial triage assessment does not replace that evaluation, and the importance of remaining in the ED until their evaluation is complete.     Tiffanie Blassingame A, PA-C 07/19/24 1409

## 2024-07-19 NOTE — ED Notes (Signed)
 Fall last night. Patient endorses no dizziness prior to fall. Patient endorses no loss of consciousness or head strike. Patient denies taking blood thinners. Left and right knee to ankle with pain

## 2024-07-19 NOTE — ED Triage Notes (Signed)
 Pt reports she tripped and fell last night. Reports bilateral knee pain. Denies hitting her head. No thinners.

## 2024-07-19 NOTE — Progress Notes (Signed)
 Orthopedic Tech Progress Note Patient Details:  Miranda Mathis 1951/04/23 [id number]  Ortho Devices Type of Ortho Device: CAM walker Ortho Device/Splint Location: LLE Ortho Device/Splint Interventions: Ordered, Application, Adjustment   Post Interventions Patient Tolerated: Well Instructions Provided: Care of device, Adjustment of device  Adine MARLA Blush 07/19/2024, 7:12 PM

## 2024-07-19 NOTE — ED Provider Notes (Signed)
 " [facility] AT [facility] Provider Note   CSN: [id number] Arrival date & time: 07/19/24  1226     Patient presents with: Fall   Miranda Mathis is a 74 y.o. female.  {Add pertinent medical, surgical, social history, OB history to HPI:4864} 74 year old female presents for evaluation after a fall.  States she tripped and fell last night.  She is having bilateral leg pain although it is worse on the left.  States it radiates from her ankle up to her knee on both sides.  Denies any other symptoms or concerns.  Her head.   Fall Pertinent negatives include no chest pain, no abdominal pain and no shortness of breath.       Prior to Admission medications  Medication Sig Start Date End Date Taking? Authorizing Provider  Cholecalciferol  (VITAMIN D ) 50 MCG (2000 UT) tablet Take 2,000 Units by mouth daily.     [provider]  clobetasol  (TEMOVATE ) 0.05 % external solution APPLY 1 APPLICATION TOPICALLY 2 (TWO) TIMES DAILY. SCALP 03/20/24   [name], MD  clotrimazole -betamethasone  (LOTRISONE ) cream APPLY 1 APPLICATION TOPICALLY DAILY 03/20/24   [name], MD  docusate sodium (COLACE) 50 MG capsule Take 50 mg by mouth 2 (two) times daily.    [provider]  furosemide  (LASIX ) 20 MG tablet TAKE 1 TABLET (20 MG TOTAL) BY MOUTH DAILY AS NEEDED FOR EDEMA. TAKE WITH POTASSIUM 07/23/23   [name], MD  gabapentin  (NEURONTIN ) 300 MG capsule TAKE 2 CAPSULES BY MOUTH 3 TIMES A DAY 12/20/23   [name], MD  glipiZIDE  (GLUCOTROL  XL) 5 MG 24 hr tablet Take 1 tablet (5 mg total) by mouth daily with breakfast. 07/29/23   [name], MD  KLOR-CON  M20 20 MEQ tablet TAKE 1 TABLET (20 MEQ TOTAL) BY MOUTH DAILY AS NEEDED (LEG SWELLING). TAKE WITH FUROSEMIDE  09/07/23   [name], MD  levothyroxine  (SYNTHROID ) 200 MCG tablet TAKE 1 TABLET BY MOUTH EVERY DAY 03/20/24   [name], MD  levothyroxine  (SYNTHROID ) 25 MCG tablet TAKE 1 TABLET  BY MOUTH EVERY DAY 03/13/24   [name], MD  lisinopril  (ZESTRIL ) 5 MG tablet TAKE 1 TABLET (5 MG TOTAL) BY MOUTH DAILY. 03/20/24   [name], MD  meclizine  (ANTIVERT ) 12.5 MG tablet Take 1 tablet (12.5 mg total) by mouth 3 (three) times daily as needed for dizziness (May cause drowsiness.). 07/19/23   Lucius Krabbe, NP  meloxicam  (MOBIC ) 7.5 MG tablet TAKE 1 TABLET BY MOUTH EVERY DAY AS NEEDED FOR PAIN 06/28/24   [name], MD  metFORMIN  (GLUCOPHAGE ) 1000 MG tablet TAKE 1 TABLET (1,000 MG TOTAL) BY MOUTH TWICE A DAY WITH FOOD 07/12/23   [name], MD  omeprazole  (PRILOSEC) 40 MG capsule TAKE 1 CAPSULE BY MOUTH EVERY DAY 03/20/24   [name], MD  ondansetron  (ZOFRAN -ODT) 4 MG disintegrating tablet Take 1 tablet (4 mg total) by mouth every 8 (eight) hours as needed for nausea or vomiting. 07/19/23   Lucius Krabbe, NP  oxyCODONE  (ROXICODONE ) 5 MG immediate release tablet Take 1 tablet (5 mg total) by mouth every 6 (six) hours as needed. 06/12/24   Agarwala, Anshul, MD  rOPINIRole  (REQUIP ) 2 MG tablet Take 1 tablet (2 mg total) by mouth at bedtime. 11/16/23   [name], MD  rosuvastatin  (CRESTOR ) 40 MG tablet TAKE 1 TABLET BY MOUTH EVERY DAY 07/12/23   [name], MD  sertraline  (  ZOLOFT ) 100 MG tablet TAKE 2 TABLETS BY MOUTH EVERY DAY 11/19/23   [name], MD  sulfamethoxazole -trimethoprim  (BACTRIM  DS) 800-160 MG tablet Take 1 tablet by mouth 2 (two) times daily. 05/17/24   [name], MD  zolpidem  (AMBIEN ) 10 MG tablet Take 1 tablet (10 mg total) by mouth at bedtime as needed. for sleep 05/17/24   [name], MD  pravastatin  (PRAVACHOL ) 40 MG tablet Take 1 tablet (40 mg total) by mouth daily. 01/26/19 06/06/19  [name], MD    Allergies: Ketorolac     Review of Systems  Constitutional:  Negative for chills and fever.  HENT:  Negative for ear pain and sore throat.   Eyes:  Negative for pain and visual disturbance.  Respiratory:  Negative  for cough and shortness of breath.   Cardiovascular:  Negative for chest pain and palpitations.  Gastrointestinal:  Negative for abdominal pain and vomiting.  Genitourinary:  Negative for dysuria and hematuria.  Musculoskeletal:  Negative for arthralgias and back pain.       Admits leg pain bilateral   Skin:  Negative for color change and rash.  Neurological:  Negative for seizures and syncope.  All other systems reviewed and are negative.   Updated Vital Signs BP (!) 118/51 (BP Location: Right Arm)   Pulse 70   Temp 98 F (36.7 C) (Oral)   Resp 20   Ht 5' 2 (1.575 m)   Wt 99.8 kg   SpO2 98%   BMI 40.24 kg/m   Physical Exam Musculoskeletal:        General: Tenderness present.     Comments: Ttp of bilateral lower extremities, worse on the left near the knee     (all labs ordered are listed, but only abnormal results are displayed) Labs Reviewed - No data to display  EKG: None  Radiology: DG Knee Complete 4 Views Left Result Date: 07/19/2024 EXAM: 4 OR MORE VIEW(S) XRAY OF THE LEFT KNEE 07/19/2024 02:44:00 PM COMPARISON: None available. CLINICAL HISTORY: Fall. FINDINGS: BONES AND JOINTS: Oblique fracture through the proximal fibular metaphysis, without significant displacement. No malalignment. Small suprapatellar joint effusion. Moderate 3 compartmental osteoarthritis greatest in the medial compartment, with chondrocalcinosis. SOFT TISSUES: Unremarkable. IMPRESSION: 1. Oblique fracture through the proximal fibular metaphysis, without significant displacement. 2. Small suprapatellar joint effusion. Electronically signed by: Elsie Gravely MD 07/19/2024 03:17 PM EST RP Workstation: [id number]   DG Ankle Complete Left Result Date: 07/19/2024 EXAM: 3 OR MORE VIEW(S) XRAY OF THE LEFT ANKLE 07/19/2024 02:44:00 PM CLINICAL HISTORY: Fall COMPARISON: None available. FINDINGS: BONES AND JOINTS: Prominent superior calcaneal spurs. No acute fracture. No malalignment. SOFT TISSUES:  Diffuse soft tissue edema. IMPRESSION: 1. No evidence of acute traumatic injury. 2. Diffuse soft tissue edema. Electronically signed by: Elsie Gravely MD 07/19/2024 03:16 PM EST RP Workstation: [id number]   DG Knee Complete 4 Views Right Result Date: 07/19/2024 EXAM: 4 OR MORE VIEW(S) XRAY OF THE RIGHT KNEE 07/19/2024 02:44:00 PM COMPARISON: Comparison with CT right knee 10/23/2022. CLINICAL HISTORY: Fall. FINDINGS: BONES AND JOINTS: No acute fracture. No malalignment. Small joint effusion. Moderate medial compartment osteoarthritis. Chondrocalcinosis. SOFT TISSUES: Unremarkable. IMPRESSION: 1. Small joint effusion. 2. Moderate degenerative changes. Electronically signed by: Elsie Gravely MD 07/19/2024 03:15 PM EST RP Workstation: [id number]    {Document cardiac monitor, telemetry assessment procedure when appropriate:32947} Procedures   Medications Ordered in the ED  oxyCODONE -acetaminophen  (PERCOCET/ROXICET) 5-325 MG per tablet 1 tablet (1 tablet Oral Given 07/19/24 1410)      {  Click here for ABCD2, HEART and other calculators REFRESH Note before signing:1}                              Medical Decision Making  ***  {Document critical care time when appropriate  Document review of labs and clinical decision tools ie CHADS2VASC2, etc  Document your independent review of radiology images and any outside records  Document your discussion with family members, caretakers and with consultants  Document social determinants of health affecting pt's care  Document your decision making why or why not admission, treatments were needed:32947:::1}   Final diagnoses:  None    ED Discharge Orders     None        "

## 2024-07-19 NOTE — Discharge Instructions (Signed)
 You can use Tylenol  and Motrin  as needed for pain.  Call and follow-up with orthopedic surgery, the numbers listed below.  You can use your walker and boot as needed for pain control and you are allowed to weight-bear as tolerated.

## 2024-07-20 ENCOUNTER — Telehealth: Payer: Self-pay

## 2024-07-20 LAB — ACID FAST CULTURE WITH REFLEXED SENSITIVITIES (MYCOBACTERIA): Acid Fast Culture: NEGATIVE

## 2024-07-20 NOTE — Telephone Encounter (Signed)
 Transition Care Management Follow-up Telephone Call Date of discharge and from where: 07/19/24 Miranda Mathis How have you been since you were released from the hospital? Same pain is worse Any questions or concerns? No  Items Reviewed: Did the pt receive and understand the discharge instructions provided? Yes  Medications obtained and verified? No  Other? No  Any new allergies since your discharge? No  Dietary orders reviewed? No Do you have support at home? Yes   Home Care and Equipment/Supplies: Were home health services ordered? no If so, what is the name of the agency? NA  Has the agency set up a time to come to the patient's home? not applicable Were any new equipment or medical supplies ordered?  No What is the name of the medical supply agency? NA Were you able to get the supplies/equipment? not applicable Do you have any questions related to the use of the equipment or supplies? No  Functional Questionnaire: (I = Independent and D = Dependent) ADLs: I  Bathing/Dressing- I  Meal Prep- I  Eating- I  Maintaining continence- I  Transferring/Ambulation- I  Managing Meds- I  Follow up appointments reviewed:  PCP Hospital f/u appt confirmed? N/A Pt not needing to see PCP Specialist Hospital f/u appt confirmed? Yes  Scheduled to see Sharl Selinda Dover, MD (Orthopedic Surgery)  pt calling to schedule today Are transportation arrangements needed? No  If their condition worsens, is the pt aware to call PCP or go to the Emergency Dept.? Yes Was the patient provided with contact information for the PCP's office or ED? Yes Was to pt encouraged to call back with questions or concerns? Yes

## 2024-07-21 ENCOUNTER — Other Ambulatory Visit: Payer: Self-pay | Admitting: Family Medicine

## 2024-07-23 ENCOUNTER — Other Ambulatory Visit: Payer: Self-pay | Admitting: Family Medicine

## 2024-10-16 ENCOUNTER — Ambulatory Visit: Admitting: Family Medicine
